# Patient Record
Sex: Male | Born: 1965 | Race: White | Hispanic: No | Marital: Single | Smoking: Never smoker
Health system: Southern US, Community
[De-identification: ages and names within clinical notes are randomized; demographics above are authoritative.]

## PROBLEM LIST (undated history)

## (undated) DIAGNOSIS — C801 Malignant (primary) neoplasm, unspecified: Secondary | ICD-10-CM

## (undated) DIAGNOSIS — C8510 Unspecified B-cell lymphoma, unspecified site: Secondary | ICD-10-CM

## (undated) DIAGNOSIS — I2510 Atherosclerotic heart disease of native coronary artery without angina pectoris: Secondary | ICD-10-CM

## (undated) DIAGNOSIS — I10 Essential (primary) hypertension: Secondary | ICD-10-CM

## (undated) DIAGNOSIS — C8590 Non-Hodgkin lymphoma, unspecified, unspecified site: Secondary | ICD-10-CM

## (undated) DIAGNOSIS — G459 Transient cerebral ischemic attack, unspecified: Secondary | ICD-10-CM

## (undated) DIAGNOSIS — I255 Ischemic cardiomyopathy: Secondary | ICD-10-CM

## (undated) DIAGNOSIS — E538 Deficiency of other specified B group vitamins: Secondary | ICD-10-CM

## (undated) DIAGNOSIS — T451X5A Adverse effect of antineoplastic and immunosuppressive drugs, initial encounter: Secondary | ICD-10-CM

## (undated) DIAGNOSIS — D126 Benign neoplasm of colon, unspecified: Secondary | ICD-10-CM

## (undated) DIAGNOSIS — R072 Precordial pain: Secondary | ICD-10-CM

## (undated) DIAGNOSIS — M5090 Cervical disc disorder, unspecified, unspecified cervical region: Secondary | ICD-10-CM

## (undated) HISTORY — PX: CORONARY ARTERY BYPASS GRAFT: SHX141

## (undated) HISTORY — PX: OTHER SURGICAL HISTORY: SHX169

## (undated) HISTORY — PX: ANKLE SURGERY: SHX546

## (undated) HISTORY — DX: Transient cerebral ischemic attack, unspecified: G45.9

---

## 2005-12-02 ENCOUNTER — Ambulatory Visit: Payer: Self-pay | Admitting: Podiatry

## 2006-08-14 ENCOUNTER — Ambulatory Visit: Payer: Self-pay | Admitting: Podiatry

## 2014-11-16 DIAGNOSIS — R7301 Impaired fasting glucose: Secondary | ICD-10-CM | POA: Insufficient documentation

## 2014-11-16 DIAGNOSIS — E785 Hyperlipidemia, unspecified: Secondary | ICD-10-CM | POA: Insufficient documentation

## 2014-11-16 DIAGNOSIS — I10 Essential (primary) hypertension: Secondary | ICD-10-CM | POA: Insufficient documentation

## 2014-11-16 DIAGNOSIS — R7302 Impaired glucose tolerance (oral): Secondary | ICD-10-CM | POA: Insufficient documentation

## 2014-11-16 DIAGNOSIS — J302 Other seasonal allergic rhinitis: Secondary | ICD-10-CM | POA: Insufficient documentation

## 2014-11-16 DIAGNOSIS — R809 Proteinuria, unspecified: Secondary | ICD-10-CM | POA: Insufficient documentation

## 2017-09-29 ENCOUNTER — Ambulatory Visit
Admission: EM | Admit: 2017-09-29 | Discharge: 2017-09-29 | Disposition: A | Discharge status: Home / Self Care | Payer: Commercial Managed Care - PPO | Attending: Family Medicine | Admitting: Family Medicine

## 2017-09-29 ENCOUNTER — Other Ambulatory Visit: Payer: Self-pay

## 2017-09-29 DIAGNOSIS — J302 Other seasonal allergic rhinitis: Secondary | ICD-10-CM | POA: Diagnosis not present

## 2017-09-29 DIAGNOSIS — R438 Other disturbances of smell and taste: Secondary | ICD-10-CM

## 2017-09-29 DIAGNOSIS — R439 Unspecified disturbances of smell and taste: Secondary | ICD-10-CM

## 2017-09-29 MED ORDER — FLUTICASONE PROPIONATE 50 MCG/ACT NA SUSP: 2.0000 | Freq: Every day | NASAL | 0 refills | Status: DC

## 2017-09-29 NOTE — Discharge Instructions (Signed)
Flonase daily.  If continues to persist, see ENT. I anticipate a slow improvement.  Take care  Dr. Lacinda Axon

## 2017-09-29 NOTE — ED Triage Notes (Signed)
Patient states that around 3 weeks ago he started with flu symptoms that then resolved and led in to allergy like symptoms. Patient states that he is here today because he has lost his sense of smell and taste.

## 2017-09-29 NOTE — ED Provider Notes (Signed)
[facility]  CSN: [id number] Arrival date & time: 09/29/17  1521  History   Chief Complaint Chief Complaint  Patient presents with  . Nasal Congestion   HPI  52 year old male presents with loss of taste and smell.  Patient states that 3 weeks ago he had suspected influenza.  This resolved within a few days and then he began to experience allergic symptoms.  He states that he typically has allergies at this time of the year.  He has a history of allergic rhinitis.  States that he has used Flonase for a few days without significant improvement.  He denies any respiratory symptoms currently.  He states that he feels well.  However, he is having difficulty with smell and taste.  He states that he is essentially lost his sense of smell and taste.  This is troublesome to him.  No recent fever.  No other associated symptoms.  No other complaints or concerns at this time.  PMH - HTN, HLD, Allergic rhinitis  Past Surgical History:  Procedure Laterality Date  . ANKLE SURGERY Right     Home Medications    Prior to Admission medications   Medication Sig Start Date End Date Taking? Authorizing Provider  aspirin 81 MG chewable tablet Chew by mouth daily.   Yes [provider]  felodipine (PLENDIL) 5 MG 24 hr tablet Take 5 mg by mouth daily.   Yes [provider]  losartan-hydrochlorothiazide (HYZAAR) 100-25 MG tablet Take 1 tablet by mouth daily.   Yes [provider]  simvastatin (ZOCOR) 40 MG tablet Take 40 mg by mouth daily.   Yes [provider]  fluticasone (FLONASE) 50 MCG/ACT nasal spray Place 2 sprays into both nostrils daily. 09/29/17   Coral Spikes, DO    Family History Family History  Problem Relation Age of Onset  . Hypertension Mother   . Hypertension Father   . Multiple myeloma Father     Social History Social History   Tobacco Use  . Smoking status: Never Smoker  . Smokeless tobacco: Never Used  Substance Use Topics  .  Alcohol use: Yes    Frequency: Never    Comment: rarely  . Drug use: No    Allergies   Patient has no known allergies.  Review of Systems Review of Systems  Constitutional: Negative.   HENT:       Loss of smell and taste.   Physical Exam Triage Vital Signs ED Triage Vitals  Enc Vitals Group     BP 09/29/17 1551 (!) 149/98     Pulse Rate 09/29/17 1551 99     Resp 09/29/17 1551 18     Temp 09/29/17 1551 98.1 F (36.7 C)     Temp Source 09/29/17 1551 Oral     SpO2 09/29/17 1551 99 %     Weight 09/29/17 1549 265 lb (120.2 kg)     Height 09/29/17 1549 6' (1.829 m)     Head Circumference --      Peak Flow --      Pain Score 09/29/17 1549 0     Pain Loc --      Pain Edu? --      Excl. in [place]? --    Updated Vital Signs BP (!) 149/98 (BP Location: Left Arm)   Pulse 99   Temp 98.1 F (36.7 C) (Oral)   Resp 18   Ht 6' (1.829 m)   Wt 265 lb (120.2 kg)   SpO2 99%  BMI 35.94 kg/m    Physical Exam  Constitutional: He is oriented to person, place, and time. He appears well-developed. No distress.  HENT:  Head: Normocephalic and atraumatic.  Nose: Nose normal.  Mouth/Throat: Oropharynx is clear and moist.  Eyes: Conjunctivae are normal. Right eye exhibits no discharge. Left eye exhibits no discharge.  Cardiovascular: Normal rate and regular rhythm.  Pulmonary/Chest: Effort normal and breath sounds normal. He has no wheezes. He has no rales.  Neurological: He is alert and oriented to person, place, and time.  Psychiatric: He has a normal mood and affect. His behavior is normal.  Nursing note and vitals reviewed.  UC Treatments / Results  Labs (all labs ordered are listed, but only abnormal results are displayed) Labs Reviewed - No data to display  EKG  EKG Interpretation None       Radiology No results found.  Procedures Procedures (including critical care time)  Medications Ordered in UC Medications - No data to display   Initial Impression /  Assessment and Plan / UC Course  I have reviewed the triage vital signs and the nursing notes.  Pertinent labs & imaging results that were available during my care of the patient were reviewed by me and considered in my medical decision making (see chart for details).     52 year old male presents with loss of smell and taste.  Likely secondary to recent infection and/or allergic rhinitis.  Treating with Flonase.  Advised to see ENT if he fails to improve or worsens.  Final Clinical Impressions(s) / UC Diagnoses   Final diagnoses:  Smell and taste disorder    ED Discharge Orders        Ordered    fluticasone (FLONASE) 50 MCG/ACT nasal spray  Daily     09/29/17 1602     Controlled Substance Prescriptions [place] Controlled Substance Registry consulted? Not Applicable   Coral Spikes, DO 09/29/17 1617

## 2017-10-24 ENCOUNTER — Other Ambulatory Visit: Payer: Self-pay | Admitting: Family Medicine

## 2017-10-28 ENCOUNTER — Other Ambulatory Visit: Payer: Self-pay | Admitting: Family Medicine

## 2017-11-02 ENCOUNTER — Other Ambulatory Visit: Payer: Self-pay | Admitting: Family Medicine

## 2017-11-19 DIAGNOSIS — D126 Benign neoplasm of colon, unspecified: Secondary | ICD-10-CM | POA: Insufficient documentation

## 2017-11-24 ENCOUNTER — Other Ambulatory Visit: Payer: Self-pay | Admitting: Physician Assistant

## 2017-11-24 DIAGNOSIS — R430 Anosmia: Secondary | ICD-10-CM

## 2017-11-27 ENCOUNTER — Ambulatory Visit
Admission: RE | Admit: 2017-11-27 | Discharge: 2017-11-27 | Disposition: A | Discharge status: Home / Self Care | Payer: Commercial Managed Care - PPO | Source: Ambulatory Visit | Attending: Physician Assistant | Admitting: Physician Assistant

## 2017-11-27 DIAGNOSIS — R430 Anosmia: Secondary | ICD-10-CM | POA: Diagnosis present

## 2017-11-27 MED ORDER — GADOBENATE DIMEGLUMINE 529 MG/ML IV SOLN
20.0000 mL | Freq: Once | INTRAVENOUS | Status: AC | PRN
  Administered 2017-11-27: 20 mL via INTRAVENOUS

## 2017-11-30 LAB — POCT I-STAT CREATININE: CREATININE: 1 mg/dL (ref 0.61–1.24)

## 2017-12-19 DIAGNOSIS — C801 Malignant (primary) neoplasm, unspecified: Secondary | ICD-10-CM

## 2017-12-19 HISTORY — DX: Malignant (primary) neoplasm, unspecified: C80.1

## 2018-01-15 ENCOUNTER — Emergency Department: Payer: Commercial Managed Care - PPO

## 2018-01-15 ENCOUNTER — Inpatient Hospital Stay
Admission: EM | Admit: 2018-01-15 | Discharge: 2018-01-16 | DRG: 178 | Disposition: A | Discharge status: Home / Self Care | Payer: Commercial Managed Care - PPO | Attending: Internal Medicine | Admitting: Internal Medicine

## 2018-01-15 ENCOUNTER — Encounter: Payer: Self-pay | Admitting: Emergency Medicine

## 2018-01-15 ENCOUNTER — Ambulatory Visit: Payer: Commercial Managed Care - PPO | Admitting: Radiation Oncology

## 2018-01-15 ENCOUNTER — Other Ambulatory Visit: Payer: Self-pay

## 2018-01-15 ENCOUNTER — Inpatient Hospital Stay (HOSPITAL_COMMUNITY)
Admit: 2018-01-15 | Discharge: 2018-01-15 | Disposition: A | Discharge status: Home / Self Care | Payer: Commercial Managed Care - PPO | Attending: Internal Medicine | Admitting: Internal Medicine

## 2018-01-15 DIAGNOSIS — J9859 Other diseases of mediastinum, not elsewhere classified: Secondary | ICD-10-CM | POA: Diagnosis present

## 2018-01-15 DIAGNOSIS — K219 Gastro-esophageal reflux disease without esophagitis: Secondary | ICD-10-CM | POA: Diagnosis present

## 2018-01-15 DIAGNOSIS — N281 Cyst of kidney, acquired: Secondary | ICD-10-CM | POA: Diagnosis present

## 2018-01-15 DIAGNOSIS — Z8249 Family history of ischemic heart disease and other diseases of the circulatory system: Secondary | ICD-10-CM

## 2018-01-15 DIAGNOSIS — Z807 Family history of other malignant neoplasms of lymphoid, hematopoietic and related tissues: Secondary | ICD-10-CM | POA: Diagnosis not present

## 2018-01-15 DIAGNOSIS — Z7982 Long term (current) use of aspirin: Secondary | ICD-10-CM | POA: Diagnosis not present

## 2018-01-15 DIAGNOSIS — R1310 Dysphagia, unspecified: Secondary | ICD-10-CM | POA: Diagnosis present

## 2018-01-15 DIAGNOSIS — I10 Essential (primary) hypertension: Secondary | ICD-10-CM

## 2018-01-15 DIAGNOSIS — R079 Chest pain, unspecified: Secondary | ICD-10-CM

## 2018-01-15 DIAGNOSIS — F458 Other somatoform disorders: Secondary | ICD-10-CM

## 2018-01-15 DIAGNOSIS — I313 Pericardial effusion (noninflammatory): Secondary | ICD-10-CM | POA: Diagnosis present

## 2018-01-15 DIAGNOSIS — R634 Abnormal weight loss: Secondary | ICD-10-CM | POA: Diagnosis not present

## 2018-01-15 DIAGNOSIS — R222 Localized swelling, mass and lump, trunk: Secondary | ICD-10-CM

## 2018-01-15 DIAGNOSIS — E279 Disorder of adrenal gland, unspecified: Secondary | ICD-10-CM | POA: Diagnosis present

## 2018-01-15 DIAGNOSIS — E785 Hyperlipidemia, unspecified: Secondary | ICD-10-CM | POA: Diagnosis present

## 2018-01-15 DIAGNOSIS — R61 Generalized hyperhidrosis: Secondary | ICD-10-CM | POA: Diagnosis not present

## 2018-01-15 DIAGNOSIS — R229 Localized swelling, mass and lump, unspecified: Secondary | ICD-10-CM

## 2018-01-15 DIAGNOSIS — IMO0002 Reserved for concepts with insufficient information to code with codable children: Secondary | ICD-10-CM

## 2018-01-15 DIAGNOSIS — R0789 Other chest pain: Secondary | ICD-10-CM | POA: Diagnosis not present

## 2018-01-15 DIAGNOSIS — C8510 Unspecified B-cell lymphoma, unspecified site: Secondary | ICD-10-CM | POA: Diagnosis present

## 2018-01-15 HISTORY — DX: Essential (primary) hypertension: I10

## 2018-01-15 LAB — BASIC METABOLIC PANEL
Anion gap: 11 (ref 5–15)
BUN: 22 mg/dL — ABNORMAL HIGH (ref 6–20)
CALCIUM: 9.1 mg/dL (ref 8.9–10.3)
CO2: 22 mmol/L (ref 22–32)
Chloride: 104 mmol/L (ref 98–111)
Creatinine, Ser: 1 mg/dL (ref 0.61–1.24)
GFR calc Af Amer: >60 mL/min (ref >60)
GFR calc non Af Amer: >60 mL/min (ref >60)
Glucose, Bld: 121 mg/dL — ABNORMAL HIGH (ref 70–99)
Potassium: 3.3 mmol/L — ABNORMAL LOW (ref 3.5–5.1)
Sodium: 137 mmol/L (ref 135–145)

## 2018-01-15 LAB — CBC
HCT: 42.2 % (ref 40.0–52.0)
HEMOGLOBIN: 14.9 g/dL (ref 13.0–18.0)
MCH: 28.4 pg (ref 26.0–34.0)
MCHC: 35.3 g/dL (ref 32.0–36.0)
MCV: 80.3 fL (ref 80.0–100.0)
Platelets: 336 10*3/uL (ref 150–440)
RBC: 5.25 MIL/uL (ref 4.40–5.90)
RDW: 16.9 % — ABNORMAL HIGH (ref 11.5–14.5)
WBC: 13 10*3/uL — ABNORMAL HIGH (ref 3.8–10.6)

## 2018-01-15 LAB — URIC ACID: Uric Acid, Serum: 6.1 mg/dL (ref 3.7–8.6)

## 2018-01-15 LAB — TROPONIN I: Troponin I: <0.03 ng/mL (ref <0.03)

## 2018-01-15 LAB — LACTATE DEHYDROGENASE: LDH: 273 U/L — AB (ref 98–192)

## 2018-01-15 LAB — ECHOCARDIOGRAM COMPLETE: WEIGHTICAEL: 4096 [oz_av]

## 2018-01-15 MED ORDER — HYDROCHLOROTHIAZIDE 25 MG PO TABS
25.0000 mg | ORAL_TABLET | Freq: Every day | ORAL | Status: DC
  Administered 2018-01-16: 25 mg via ORAL
  Filled 2018-01-15: qty 1

## 2018-01-15 MED ORDER — ACETAMINOPHEN 325 MG PO TABS: 650.0000 mg | ORAL_TABLET | Freq: Four times a day (QID) | ORAL | Status: DC | PRN

## 2018-01-15 MED ORDER — ASPIRIN 81 MG PO CHEW
81.0000 mg | CHEWABLE_TABLET | Freq: Every day | ORAL | Status: DC
  Administered 2018-01-16: 11:00:00 81 mg via ORAL
  Filled 2018-01-15: qty 1

## 2018-01-15 MED ORDER — TRAMADOL HCL 50 MG PO TABS
50.0000 mg | ORAL_TABLET | Freq: Four times a day (QID) | ORAL | Status: DC | PRN
  Administered 2018-01-15: 50 mg via ORAL
  Filled 2018-01-15: qty 1

## 2018-01-15 MED ORDER — FLUTICASONE PROPIONATE 50 MCG/ACT NA SUSP
2.0000 | Freq: Every day | NASAL | Status: DC
Start: 2018-01-15 — End: 2018-01-16
  Administered 2018-01-16: 2 via NASAL
  Filled 2018-01-15: qty 16

## 2018-01-15 MED ORDER — ALBUTEROL SULFATE (2.5 MG/3ML) 0.083% IN NEBU: 2.5000 mg | INHALATION_SOLUTION | RESPIRATORY_TRACT | Status: DC | PRN

## 2018-01-15 MED ORDER — LOSARTAN POTASSIUM-HCTZ 100-25 MG PO TABS: 1.0000 | ORAL_TABLET | Freq: Every day | ORAL | Status: DC

## 2018-01-15 MED ORDER — FELODIPINE ER 5 MG PO TB24
5.0000 mg | ORAL_TABLET | Freq: Every day | ORAL | Status: DC
  Administered 2018-01-16: 11:00:00 5 mg via ORAL
  Filled 2018-01-15 (×2): qty 1

## 2018-01-15 MED ORDER — SIMVASTATIN 20 MG PO TABS
40.0000 mg | ORAL_TABLET | Freq: Every day | ORAL | Status: DC
  Administered 2018-01-16: 11:00:00 40 mg via ORAL
  Filled 2018-01-15: qty 2

## 2018-01-15 MED ORDER — ACETAMINOPHEN 650 MG RE SUPP: 650.0000 mg | Freq: Four times a day (QID) | RECTAL | Status: DC | PRN

## 2018-01-15 MED ORDER — LOSARTAN POTASSIUM 50 MG PO TABS
100.0000 mg | ORAL_TABLET | Freq: Every day | ORAL | Status: DC
  Administered 2018-01-16: 100 mg via ORAL
  Filled 2018-01-15: qty 2

## 2018-01-15 MED ORDER — IOPAMIDOL (ISOVUE-370) INJECTION 76%
100.0000 mL | Freq: Once | INTRAVENOUS | Status: AC | PRN
  Administered 2018-01-15: 100 mL via INTRAVENOUS

## 2018-01-15 MED ORDER — ONDANSETRON HCL 4 MG/2ML IJ SOLN: 4.0000 mg | Freq: Four times a day (QID) | INTRAMUSCULAR | Status: DC | PRN

## 2018-01-15 MED ORDER — ENOXAPARIN SODIUM 40 MG/0.4ML ~~LOC~~ SOLN
40.0000 mg | SUBCUTANEOUS | Status: DC
  Administered 2018-01-15 – 2018-01-16 (×2): 40 mg via SUBCUTANEOUS
  Filled 2018-01-15 (×2): qty 0.4

## 2018-01-15 MED ORDER — ONDANSETRON HCL 4 MG PO TABS: 4.0000 mg | ORAL_TABLET | Freq: Four times a day (QID) | ORAL | Status: DC | PRN

## 2018-01-15 MED ORDER — OXYCODONE HCL 5 MG PO TABS
5.0000 mg | ORAL_TABLET | Freq: Four times a day (QID) | ORAL | Status: DC | PRN
  Administered 2018-01-15: 5 mg via ORAL
  Filled 2018-01-15: qty 1

## 2018-01-15 MED ORDER — PANTOPRAZOLE SODIUM 40 MG PO TBEC
40.0000 mg | DELAYED_RELEASE_TABLET | Freq: Every day | ORAL | Status: DC
  Administered 2018-01-15 – 2018-01-16 (×2): 40 mg via ORAL
  Filled 2018-01-15 (×2): qty 1

## 2018-01-15 NOTE — Consult Note (Signed)
[facility]  Date of admission:  01/15/2018  Inpatient day:  01/15/2018  Consulting physician:  Dr. Cheree Ditto.  Reason for Consultation:  Mediastinal mass.  Chief Complaint: Ronnie Mcintyre is a 52 y.o. male with an anterior mediastinal mass who was admitted through the emergency room.  HPI:   The patient has noted a sensation of something stuck in his throat for 1 1/2 months.  He has been seen by ENT twice.  Upper fibroscopic evaluation was negative.  Working diagnosis was reflux.  He has a follow-up on 02/02/2018.  He describes becoming more uncomfortable with pain into his upper chest (upper third of sternum), neck, and right shoulder blade.  He describes his right arm hurting and inability to lay on that side.  He has needed to sleep in a chair for the past 4-5 nights.  This week he noted sweats on his head at night.  He noted a 15 pound weight loss in the past 3 months.  He has had a cough.  He presented to the [facility] ER with chest pain (sense of fullness in upper chest) with radiation into neck and arm.  EKG was unremarkable.  CXR revealed prominence of the RIGHT mediastinal contour which may represent ectatic/aneurysmal or tortuous aorta.  Chest CT angiogram on 01/15/2018 revealed an 11.3 x 8.0 cm anterior mediastinal mass.  Echo on 01/15/2018 revealed an EF of 55060% and no wall motion abnormalities.   Past Medical History:  Diagnosis Date  . Hypertension     Past Surgical History:  Procedure Laterality Date  . ANKLE SURGERY Right     Family History  Problem Relation Age of Onset  . Hypertension Mother   . Hypertension Father   . Multiple myeloma Father     Social History:  reports that he has never smoked. He has never used smokeless tobacco. He reports that he drinks alcohol. He reports that he does not use drugs.  He drinks a beer 1-2 x/week.  He works for [employer].  He lives in [place].  His is accompanied by his  parents.  Allergies: No Known Allergies  Medications Prior to Admission  Medication Sig Dispense Refill  . aspirin 81 MG chewable tablet Chew 81 mg by mouth daily.     . felodipine (PLENDIL) 5 MG 24 hr tablet Take 5 mg by mouth daily.    [name] losartan-hydrochlorothiazide (HYZAAR) 100-25 MG tablet Take 1 tablet by mouth daily.    [name] omeprazole (PRILOSEC) 20 MG capsule Take 1 capsule by mouth daily.  3  . simvastatin (ZOCOR) 40 MG tablet Take 40 mg by mouth daily.    . fluticasone (FLONASE) 50 MCG/ACT nasal spray Place 2 sprays into both nostrils daily. 16 g 0    Review of Systems: GENERAL:  Feels "ok".  No fevers.  Head sweats at night.  Weight loss of 15 pounds in 3 months. PERFORMANCE STATUS (ECOG):  1 HEENT:  No visual changes, runny nose, sore throat, mouth sores or tenderness. Lungs: No shortness of breath.  Cough.  No hemoptysis. Cardiac:  Sense of chest fullness.  No palpitations or PND.  Sleeps in a chair x 4 days. GI:  No nausea, vomiting, diarrhea, constipation, melena or hematochezia. GU:  No urgency, frequency, dysuria, or hematuria. Musculoskeletal:  Shooting pains in neck and right shoulder.  No back pain.  No muscle tenderness. Extremities:  No pain or swelling. Skin:  Rash on stomach x 1 month.  No after bath  itching. Neuro:  Pain radiating to head.  No numbness or weakness, balance or coordination issues. Endocrine:  No diabetes, thyroid issues, hot flashes or night sweats. Psych:  No mood changes, depression or anxiety. Pain:  Discomfort/pain as noted above. Review of systems:  All other systems reviewed and found to be negative.  Physical Exam:  Blood pressure 130/82, pulse 97, temperature 98.6 F (37 C), temperature source Oral, resp. rate (!) 21, weight 256 lb (116.1 kg), SpO2 99 %.  GENERAL:  Well developed, well nourished, gentleman sitting comfortably in a chiar on the medical unit in no acute distress. MENTAL STATUS:  Alert and oriented to person, place and  time. HEAD:  Short dark hair.  Goatee.  Male pattern baldness.  Normocephalic, atraumatic, face symmetric, no Cushingoid features.  No facial plethora. EYES:  Blue eyes.  Pupils equal round and reactive to light and accomodation.  No conjunctivitis or scleral icterus. ENT:  Oropharynx clear without lesion.  Tongue normal. Mucous membranes moist.  RESPIRATORY:  Clear to auscultation without rales, wheezes or rhonchi. CARDIOVASCULAR:  Regular rate and rhythm without murmur, rub or gallop. ABDOMEN:  Soft, non-tender, with active bowel sounds, and no hepatosplenomegaly.  No masses. SKIN:  No rashes, ulcers or lesions. EXTREMITIES: No edema, no skin discoloration or tenderness.  No palpable cords. LYMPH NODES: No palpable cervical, supraclavicular, axillary or inguinal adenopathy  NEUROLOGICAL: Unremarkable. PSYCH:  Appropriate.   Results for orders placed or performed during the hospital encounter of 01/15/18 (from the past 48 hour(s))  Basic metabolic panel     Status: Abnormal   Collection Time: 01/15/18  6:32 AM  Result Value Ref Range   Sodium 137 135 - 145 mmol/L   Potassium 3.3 (L) 3.5 - 5.1 mmol/L   Chloride 104 98 - 111 mmol/L    Comment: Please note change in reference range.   CO2 22 22 - 32 mmol/L   Glucose, Bld 121 (H) 70 - 99 mg/dL    Comment: Please note change in reference range.   BUN 22 (H) 6 - 20 mg/dL    Comment: Please note change in reference range.   Creatinine, Ser 1.00 0.61 - 1.24 mg/dL   Calcium 9.1 8.9 - 10.3 mg/dL   GFR calc non Af Amer >60 >60 mL/min   GFR calc Af Amer >60 >60 mL/min    Comment: (NOTE) The eGFR has been calculated using the CKD EPI equation. This calculation has not been validated in all clinical situations. eGFR's persistently <60 mL/min signify possible Chronic Kidney Disease.    Anion gap 11 5 - 15    Comment: Performed at [facility], [address] [place], [place] [address]  CBC     Status: Abnormal   Collection Time:  01/15/18  6:32 AM  Result Value Ref Range   WBC 13.0 (H) 3.8 - 10.6 K/uL   RBC 5.25 4.40 - 5.90 MIL/uL   Hemoglobin 14.9 13.0 - 18.0 g/dL   HCT 42.2 40.0 - 52.0 %   MCV 80.3 80.0 - 100.0 fL   MCH 28.4 26.0 - 34.0 pg   MCHC 35.3 32.0 - 36.0 g/dL   RDW 16.9 (H) 11.5 - 14.5 %   Platelets 336 150 - 440 K/uL    Comment: Performed at [facility], [address] [place], [place] [address]  Troponin I     Status: None   Collection Time: 01/15/18  6:32 AM  Result Value Ref Range   Troponin I <0.03 <  0.03 ng/mL    Comment: Performed at [facility], [address] [place], [place] [address]  Lactate dehydrogenase     Status: Abnormal   Collection Time: 01/15/18  6:32 AM  Result Value Ref Range   LDH 273 (H) 98 - 192 U/L    Comment: Performed at [facility], [address] [place], [place] [address]  Uric acid     Status: None   Collection Time: 01/15/18  6:32 AM  Result Value Ref Range   Uric Acid, Serum 6.1 3.7 - 8.6 mg/dL    Comment: Please note change in reference range. Performed at [facility], [address] [place], [place] [address]   Troponin I     Status: None   Collection Time: 01/15/18  1:09 PM  Result Value Ref Range   Troponin I <0.03 <0.03 ng/mL    Comment: Performed at [facility], [address] [place], [place] [address]   Dg Chest 2 View  Result Date: 01/15/2018 CLINICAL DATA:  Acute RIGHT chest pain. EXAM: CHEST - 2 VIEW COMPARISON:  None. FINDINGS: Prominence of the RIGHT mediastinal contour may represent ectatic/aneurysmal or tortuous ascending thoracic aorta. The remainder of the cardiomediastinal silhouette is unremarkable. There is no evidence of focal airspace disease, pulmonary edema, suspicious pulmonary nodule/mass, pleural effusion, or pneumothorax. No acute bony abnormalities are identified. IMPRESSION: Prominence of the RIGHT mediastinal contour which may represent ectatic/aneurysmal or tortuous  aorta. Consider chest CT with contrast given RIGHT chest pain. No other significant abnormalities Electronically Signed   By: Margarette Canada M.D.   On: 01/15/2018 06:21   Ct Angio Chest Aorta W And/or Wo Contrast  Result Date: 01/15/2018 CLINICAL DATA:  Chest pain. EXAM: CT ANGIOGRAPHY CHEST WITH CONTRAST TECHNIQUE: Multidetector CT imaging of the chest was performed using the standard protocol during bolus administration of intravenous contrast. Multiplanar CT image reconstructions and MIPs were obtained to evaluate the vascular anatomy. CONTRAST:  142m ISOVUE-370 IOPAMIDOL (ISOVUE-370) INJECTION 76% COMPARISON:  Radiograph of same day. FINDINGS: Cardiovascular: Preferential opacification of the thoracic aorta. No evidence of thoracic aortic aneurysm or dissection. Normal heart size. No pericardial effusion. Mediastinum/Nodes: 11.3 x 8.0 cm soft tissue mass is noted in the anterior mediastinum directly abutting the anterior portion of the ascending thoracic aorta. This is highly concerning for neoplasm or malignancy, such as lymphoma, germ-cell tumor or possibly metastatic disease. Thyroid gland and esophagus are unremarkable. No other significant adenopathy is noted. Lungs/Pleura: Lungs are clear. No pleural effusion or pneumothorax. Upper Abdomen: No acute abnormality. Musculoskeletal: No chest wall abnormality. No acute or significant osseous findings. Review of the MIP images confirms the above findings. IMPRESSION: 11.3 x 8.0 cm anterior mediastinal mass is noted concerning for neoplasm or malignancy, such as lymphoma, germ-cell tumor or possibly metastatic disease. Tissue sampling is recommended. Electronically Signed   By: JMarijo Conception M.D.   On: 01/15/2018 07:37    Assessment:  The patient is a 52y.o. gentleman with an anterior mediastinal mass.  He presented with upper chest fullness with radiation to his neck and arm.  He has had B symptoms (sweats and weight loss) worrisome for lymphoma.  LDH  is 273.  Uric acid is normal.  Chest CT angiogram on 01/15/2018 revealed an 11.3 x 8.0 cm anterior mediastinal mass.  Plan:   1.  Oncology:  Discussed findings of imaging with patient.  Discuss concern for lymphoma or possibly germ cell tumor.  Additional labs requested.  Discussed  plan for abdomen and pelvic CT scan tomorrow.  Discuss plan for CT guided biopsy on Monday, 01/18/2018.  Discussed with radiology.  No evidence of SVC syndrome.   Thank you for allowing me to participate in STACIE KNUTZEN 's care.  I will follow him closely with you while hospitalized and after discharge in the outpatient department.   Lequita Asal, MD  01/15/2018, 8:38 PM

## 2018-01-15 NOTE — ED Triage Notes (Signed)
Pt c/o sudden onset of central chest pain this AM as well as a tingling to the right arm. Pt reports he has had throat issues that he has seen a PCP for and this AM pt unable to sleep due to the pain in the top center of chest . Pt denies N/V and SOB.

## 2018-01-15 NOTE — ED Provider Notes (Signed)
[facility] Emergency Department Provider Note   First MD Initiated Contact with Patient 01/15/18 0703     (approximate)  I have reviewed the triage vital signs and the nursing notes.   HISTORY  Chief Complaint Chest Pain    HPI Ronnie Mcintyre is a 52 y.o. male with history of hypertension presents to the emergency department with a 44-month history of central upper chest pain for which patient is currently being treated for possible GERD by ENT.  Patient states that the discomfort is persistent with a sensation of fullness in the upper chest.  Patient denies any dyspnea.  Patient denies any nausea or vomiting.  Patient presented tonight secondary to the fact that the pain has worsened with radiation now into the right arm and posterior neck.  Patient denies any lower extremity pain or swelling.  No history of DVT or PE.  No personal or family history of aneurysms.  She admits to a 15 pound weight loss in the past 3 months   Past Medical History:  Diagnosis Date  . Hypertension     There are no active problems to display for this patient.   Past Surgical History:  Procedure Laterality Date  . ANKLE SURGERY Right     Prior to Admission medications   Medication Sig Start Date End Date Taking? Authorizing Provider  aspirin 81 MG chewable tablet Chew by mouth daily.    [provider]  felodipine (PLENDIL) 5 MG 24 hr tablet Take 5 mg by mouth daily.    [provider]  fluticasone (FLONASE) 50 MCG/ACT nasal spray Place 2 sprays into both nostrils daily. 09/29/17   Coral Spikes, DO  losartan-hydrochlorothiazide (HYZAAR) 100-25 MG tablet Take 1 tablet by mouth daily.    [provider]  simvastatin (ZOCOR) 40 MG tablet Take 40 mg by mouth daily.    [provider]    Allergies No known drug allergies  Family History  Problem Relation Age of Onset  . Hypertension Mother   . Hypertension Father   . Multiple  myeloma Father     Social History Social History   Tobacco Use  . Smoking status: Never Smoker  . Smokeless tobacco: Never Used  Substance Use Topics  . Alcohol use: Yes    Frequency: Never    Comment: rarely  . Drug use: No    Review of Systems Constitutional: No fever/chills Eyes: No visual changes. ENT: No sore throat. Cardiovascular: Positive for chest pain. Respiratory: Denies shortness of breath. Gastrointestinal: No abdominal pain.  No nausea, no vomiting.  No diarrhea.  No constipation. Genitourinary: Negative for dysuria. Musculoskeletal: Negative for neck pain.  Negative for back pain. Integumentary: Negative for rash. Neurological: Negative for headaches, focal weakness or numbness.   ____________________________________________   PHYSICAL EXAM:  VITAL SIGNS: ED Triage Vitals  Enc Vitals Group     BP 01/15/18 0606 137/90     Pulse Rate 01/15/18 0606 (!) 105     Resp 01/15/18 0606 17     Temp 01/15/18 0606 97.6 F (36.4 C)     Temp Source 01/15/18 0606 Oral     SpO2 01/15/18 0606 99 %     Weight 01/15/18 0601 116.1 kg (256 lb)     Height --      Head Circumference --      Peak Flow --      Pain Score 01/15/18 0635 5     Pain Loc --  Pain Edu? --      Excl. in [place]? --     Constitutional: Alert and oriented. Well appearing and in no acute distress. Eyes: Conjunctivae are normal.  Head: Atraumatic. Mouth/Throat: Mucous membranes are moist  Oropharynx non-erythematous. Neck: No stridor.   Cardiovascular: Normal rate, regular rhythm. Good peripheral circulation. Grossly normal heart sounds. Respiratory: Normal respiratory effort.  No retractions. Lungs CTAB. Gastrointestinal: Soft and nontender. No distention.  Musculoskeletal: No lower extremity tenderness nor edema. No gross deformities of extremities. Neurologic:  Normal speech and language. No gross focal neurologic deficits are appreciated.  Skin:  Skin is warm, dry and intact. No rash  noted. Psychiatric: Mood and affect are normal. Speech and behavior are normal.  ____________________________________________   LABS (all labs ordered are listed, but only abnormal results are displayed)  Labs Reviewed  BASIC METABOLIC PANEL - Abnormal; Notable for the following components:      Result Value   Potassium 3.3 (*)    Glucose, Bld 121 (*)    BUN 22 (*)    All other components within normal limits  CBC - Abnormal; Notable for the following components:   WBC 13.0 (*)    RDW 16.9 (*)    All other components within normal limits  TROPONIN I  LACTATE DEHYDROGENASE  URIC ACID   ____________________________________________  EKG  ED ECG REPORT I, [place] N Ranvir Renovato, the attending physician, personally viewed and interpreted this ECG.   Date: 01/15/2018  EKG Time: 6:06 AM  Rate: 105  Rhythm: Sinus tachycardia  Axis: Normal  Intervals: Normal  ST&T Change: None  ____________________________________________  RADIOLOGY I, [place] N Robertta Halfhill, personally viewed and evaluated these images (plain radiographs) as part of my medical decision making, as well as reviewing the written report by the radiologist.  ED MD interpretation: Chest x-ray revealed prominence of the mediastinal contour with enlarged mediastinum.  Official radiology report(s): Dg Chest 2 View  Result Date: 01/15/2018 CLINICAL DATA:  Acute RIGHT chest pain. EXAM: CHEST - 2 VIEW COMPARISON:  None. FINDINGS: Prominence of the RIGHT mediastinal contour may represent ectatic/aneurysmal or tortuous ascending thoracic aorta. The remainder of the cardiomediastinal silhouette is unremarkable. There is no evidence of focal airspace disease, pulmonary edema, suspicious pulmonary nodule/mass, pleural effusion, or pneumothorax. No acute bony abnormalities are identified. IMPRESSION: Prominence of the RIGHT mediastinal contour which may represent ectatic/aneurysmal or tortuous aorta. Consider chest CT with contrast given  RIGHT chest pain. No other significant abnormalities Electronically Signed   By: Margarette Canada M.D.   On: 01/15/2018 06:21   Ct Angio Chest Aorta W And/or Wo Contrast  Result Date: 01/15/2018 CLINICAL DATA:  Chest pain. EXAM: CT ANGIOGRAPHY CHEST WITH CONTRAST TECHNIQUE: Multidetector CT imaging of the chest was performed using the standard protocol during bolus administration of intravenous contrast. Multiplanar CT image reconstructions and MIPs were obtained to evaluate the vascular anatomy. CONTRAST:  122mL ISOVUE-370 IOPAMIDOL (ISOVUE-370) INJECTION 76% COMPARISON:  Radiograph of same day. FINDINGS: Cardiovascular: Preferential opacification of the thoracic aorta. No evidence of thoracic aortic aneurysm or dissection. Normal heart size. No pericardial effusion. Mediastinum/Nodes: 11.3 x 8.0 cm soft tissue mass is noted in the anterior mediastinum directly abutting the anterior portion of the ascending thoracic aorta. This is highly concerning for neoplasm or malignancy, such as lymphoma, germ-cell tumor or possibly metastatic disease. Thyroid gland and esophagus are unremarkable. No other significant adenopathy is noted. Lungs/Pleura: Lungs are clear. No pleural effusion or pneumothorax. Upper Abdomen: No acute abnormality. Musculoskeletal: No chest  wall abnormality. No acute or significant osseous findings. Review of the MIP images confirms the above findings. IMPRESSION: 11.3 x 8.0 cm anterior mediastinal mass is noted concerning for neoplasm or malignancy, such as lymphoma, germ-cell tumor or possibly metastatic disease. Tissue sampling is recommended. Electronically Signed   By: Marijo Conception, M.D.   On: 01/15/2018 07:37    ____________________________________________  Procedures   ____________________________________________   INITIAL IMPRESSION / ASSESSMENT AND PLAN / ED COURSE  As part of my medical decision making, I reviewed the following data within the electronic MEDICAL RECORD NUMBER    52 year old presenting with above-stated history and physical exam secondary to chest pain.  Patient's chest x-ray revealed a widened mediastinum and as a result CT scan of the chest was performed which revealed a 11 x 8 cm anterior mediastinal mass.  Patient has no evidence of SVC syndrome at this time.  Patient discussed with Dr. Mike Gip oncologist on call as well as Dr. Posey Pronto hospitalist on-call.  Patient will be admitted for further evaluation and management.    ____________________________________________  FINAL CLINICAL IMPRESSION(S) / ED DIAGNOSES  Final diagnoses:  Mediastinal mass     MEDICATIONS GIVEN DURING THIS VISIT:  Medications  iopamidol (ISOVUE-370) 76 % injection 100 mL (100 mLs Intravenous Contrast Given 01/15/18 0704)     ED Discharge Orders    None       Note:  This document was prepared using Dragon voice recognition software and may include unintentional dictation errors.    Gregor Hams, MD 01/16/18 [phone]

## 2018-01-15 NOTE — H&P (Signed)
[facility] [place] NAME: Ronnie Mcintyre    MR#:  [id number]  DATE OF BIRTH:  07/09/1966  DATE OF ADMISSION:  01/15/2018  PRIMARY CARE PHYSICIAN: Katheren Shams   REQUESTING/REFERRING PHYSICIAN: Dr brown  CHIEF COMPLAINT:  increasing shortness of breath, chest discomfort and radiation to the right arm for 2-3 weeks and dysphagia  on and off for 5 to 6 weeks.  HISTORY OF PRESENT ILLNESS:  Demarrius Guerrero  is a 52 y.o. male with a known history of HTN, hyperlipidemia comes to the emergency room with dysphagia on and off for 5 to 6 weeks so ENT started treatment for Gerd. Patient said his symptoms do not get any better. He started having some shortness of breath could not lay conference comfortably at nighttime and started having some chest discomfort with radiation to the right arm. Came to the emergency room chest x-ray showed some mediastinal fullness. CT of the chest was done shows 11 x 8 cm and yet anterior mediastinal mass. Troponin is .03. ER physician spoke with Dr. Mike Gip and recommends for the workup in the hospital. Patient says lately has been having night sweats where his back of his head feels wet when he wakes up. Denies any significant weight loss.  PAST MEDICAL HISTORY:   Past Medical History:  Diagnosis Date  . Hypertension     PAST SURGICAL HISTOIRY:   Past Surgical History:  Procedure Laterality Date  . ANKLE SURGERY Right     SOCIAL HISTORY:   Social History   Tobacco Use  . Smoking status: Never Smoker  . Smokeless tobacco: Never Used  Substance Use Topics  . Alcohol use: Yes    Frequency: Never    Comment: rarely    FAMILY HISTORY:   Family History  Problem Relation Age of Onset  . Hypertension Mother   . Hypertension Father   . Multiple myeloma Father     DRUG ALLERGIES:  No Known Allergies  REVIEW OF SYSTEMS:  Review of Systems  Constitutional: Negative for chills, fever  and weight loss.  HENT: Negative for ear discharge, ear pain and nosebleeds.   Eyes: Negative for blurred vision, pain and discharge.  Respiratory: Negative for sputum production, shortness of breath, wheezing and stridor.   Cardiovascular: Negative for chest pain, palpitations, orthopnea and PND.  Gastrointestinal: Negative for abdominal pain, diarrhea, nausea and vomiting.  Genitourinary: Negative for frequency and urgency.  Musculoskeletal: Negative for back pain and joint pain.  Neurological: Positive for weakness. Negative for sensory change, speech change and focal weakness.  Psychiatric/Behavioral: Negative for depression and hallucinations. The patient is not nervous/anxious.      MEDICATIONS AT HOME:   Prior to Admission medications   Medication Sig Start Date End Date Taking? Authorizing Provider  aspirin 81 MG chewable tablet Chew 81 mg by mouth daily.    Yes [provider]  felodipine (PLENDIL) 5 MG 24 hr tablet Take 5 mg by mouth daily.   Yes [provider]  losartan-hydrochlorothiazide (HYZAAR) 100-25 MG tablet Take 1 tablet by mouth daily.   Yes [provider]  omeprazole (PRILOSEC) 20 MG capsule Take 1 capsule by mouth daily. 11/24/17  Yes [provider]  simvastatin (ZOCOR) 40 MG tablet Take 40 mg by mouth daily.   Yes [provider]  fluticasone (FLONASE) 50 MCG/ACT nasal spray Place 2 sprays into both nostrils daily. 09/29/17   Coral Spikes, DO      VITAL  SIGNS:  Blood pressure (!) 140/97, pulse (!) 101, temperature 98.3 F (36.8 C), temperature source Oral, resp. rate 20, weight 116.1 kg (256 lb), SpO2 100 %.  PHYSICAL EXAMINATION:  GENERAL:  52 y.o.-year-old patient lying in the bed with no acute distress.  EYES: Pupils equal, round, reactive to light and accommodation. No scleral icterus. Extraocular muscles intact.  HEENT: Head atraumatic, normocephalic. Oropharynx and nasopharynx clear.  NECK:  Supple, no jugular  venous distention. No thyroid enlargement, no tenderness.  LUNGS: Normal breath sounds bilaterally, no wheezing, rales,rhonchi or crepitation. No use of accessory muscles of respiration.  CARDIOVASCULAR: S1, S2 normal. No murmurs, rubs, or gallops.  ABDOMEN: Soft, nontender, nondistended. Bowel sounds present. No organomegaly or mass.  EXTREMITIES: No pedal edema, cyanosis, or clubbing.  NEUROLOGIC: Cranial nerves II through XII are intact. Muscle strength 5/5 in all extremities. Sensation intact. Gait not checked.  PSYCHIATRIC: The patient is alert and oriented x 3.  SKIN: No obvious rash, lesion, or ulcer.   LABORATORY PANEL:   CBC Recent Labs  Lab 01/15/18 0632  WBC 13.0*  HGB 14.9  HCT 42.2  PLT 336   ------------------------------------------------------------------------------------------------------------------  Chemistries  Recent Labs  Lab 01/15/18 0632  NA 137  K 3.3*  CL 104  CO2 22  GLUCOSE 121*  BUN 22*  CREATININE 1.00  CALCIUM 9.1   ------------------------------------------------------------------------------------------------------------------  Cardiac Enzymes Recent Labs  Lab 01/15/18 6269  TROPONINI <0.03   ------------------------------------------------------------------------------------------------------------------  RADIOLOGY:  Dg Chest 2 View  Result Date: 01/15/2018 CLINICAL DATA:  Acute RIGHT chest pain. EXAM: CHEST - 2 VIEW COMPARISON:  None. FINDINGS: Prominence of the RIGHT mediastinal contour may represent ectatic/aneurysmal or tortuous ascending thoracic aorta. The remainder of the cardiomediastinal silhouette is unremarkable. There is no evidence of focal airspace disease, pulmonary edema, suspicious pulmonary nodule/mass, pleural effusion, or pneumothorax. No acute bony abnormalities are identified. IMPRESSION: Prominence of the RIGHT mediastinal contour which may represent ectatic/aneurysmal or tortuous aorta. Consider chest CT with  contrast given RIGHT chest pain. No other significant abnormalities Electronically Signed   By: Margarette Canada M.D.   On: 01/15/2018 06:21   Ct Angio Chest Aorta W And/or Wo Contrast  Result Date: 01/15/2018 CLINICAL DATA:  Chest pain. EXAM: CT ANGIOGRAPHY CHEST WITH CONTRAST TECHNIQUE: Multidetector CT imaging of the chest was performed using the standard protocol during bolus administration of intravenous contrast. Multiplanar CT image reconstructions and MIPs were obtained to evaluate the vascular anatomy. CONTRAST:  132m ISOVUE-370 IOPAMIDOL (ISOVUE-370) INJECTION 76% COMPARISON:  Radiograph of same day. FINDINGS: Cardiovascular: Preferential opacification of the thoracic aorta. No evidence of thoracic aortic aneurysm or dissection. Normal heart size. No pericardial effusion. Mediastinum/Nodes: 11.3 x 8.0 cm soft tissue mass is noted in the anterior mediastinum directly abutting the anterior portion of the ascending thoracic aorta. This is highly concerning for neoplasm or malignancy, such as lymphoma, germ-cell tumor or possibly metastatic disease. Thyroid gland and esophagus are unremarkable. No other significant adenopathy is noted. Lungs/Pleura: Lungs are clear. No pleural effusion or pneumothorax. Upper Abdomen: No acute abnormality. Musculoskeletal: No chest wall abnormality. No acute or significant osseous findings. Review of the MIP images confirms the above findings. IMPRESSION: 11.3 x 8.0 cm anterior mediastinal mass is noted concerning for neoplasm or malignancy, such as lymphoma, germ-cell tumor or possibly metastatic disease. Tissue sampling is recommended. Electronically Signed   By: JMarijo Conception M.D.   On: 01/15/2018 07:37    EKG:    IMPRESSION AND PLAN:   RClinton Gallant  is a 52 y.o. male with a known history of HTN, hyperlipidemia comes to the emergency room with dysphagia on and off for 5 to 6 weeks so ENT started treatment for Gerd. Patient said his symptoms do not get any  better. He started having some shortness of breath could not lay conference comfortably at nighttime and started having some chest discomfort with radiation to the right arm. Came to the emergency room chest x-ray showed some mediastinal fullness. CT of the chest was done shows 11 x 8 cm and yet anterior mediastinal mass.  1. chest pain with shortness of breath night sweats dysphagia on and off for more than six weeks with abnormal CT chest suggestive anterior mediastinal mass -differential lymphoma versus germ cell tumor versus some other malignancy -ecology consultation for the workup according to Dr. Kem Parkinson evaluation  2. chest pain -patient has risk factor with hypertension and hyperlipidemia. Will rule out ACS -i troponin times three -continue blood pressure meds. PRN Nitro  3. Hyperlipidemia on statins  4. DVT prophylaxis Lovenox  This with patient and his parents in the room  All the records are reviewed and case discussed with ED provider. Management plans discussed with the patient, family and they are in agreement.  CODE STATUS: full  TOTAL TIME TAKING CARE OF THIS PATIENT: 50 minutes.    Fritzi Mandes M.D on 01/15/2018 at 11:24 AM  Between 7am to 6pm - Pager - [phone]  After 6pm go to [email or web address] - password [email or web address] [facility]  [facility]  Office  [phone]  CC: Primary care physician; Katheren Shams

## 2018-01-15 NOTE — Progress Notes (Signed)
*  PRELIMINARY RESULTS* Echocardiogram 2D Echocardiogram has been performed.  Ronnie Mcintyre 01/15/2018, 11:49 AM

## 2018-01-15 NOTE — Consult Note (Signed)
NEW PATIENT EVALUATION  Name: Ronnie Mcintyre  MRN: [id number]  Date:   01/15/2018     DOB: 12-09-1965   This 52 y.o. male patient admitted to the hospital for mediastinal mass  REFERRING PHYSICIAN: No ref. provider found  CHIEF COMPLAINT:  Chief Complaint  Patient presents with  . Chest Pain    DIAGNOSIS: The encounter diagnosis was Mediastinal mass.   PREVIOUS INVESTIGATIONS:  CT scan of the chest reviewed Clinical notes reviewed  HPI: patient is a 52 year old male admitted through the emergency department for increasing upper chest pain or discomfort and dysphagia. He had been treated for gastric reflux. He went on to have some night sweats presented to the emergency department underwent CT scan of the chest showing an 11.3 x 8.0 anteromediastinal mass concerning for neoplasm or malignancy such as lymphoma or germ cell tumor. No evidence of compression of the. A copy was seen. He has no evidence of venous jugular distention or collateral circulation. He has no facial plethora. He specifically denies hemoptysis cough or bone pain.  PLANNED TREATMENT REGIMEN: biopsy with further recommend patient's  PAST MEDICAL HISTORY:  has a past medical history of Hypertension.    PAST SURGICAL HISTORY:  Past Surgical History:  Procedure Laterality Date  . ANKLE SURGERY Right     FAMILY HISTORY: family history includes Hypertension in his father and mother; Multiple myeloma in his father.  SOCIAL HISTORY:  reports that he has never smoked. He has never used smokeless tobacco. He reports that he drinks alcohol. He reports that he does not use drugs.  ALLERGIES: Patient has no known allergies.  MEDICATIONS:  Current Facility-Administered Medications  Medication Dose Route Frequency Provider Last Rate Last Dose  . acetaminophen (TYLENOL) tablet 650 mg  650 mg Oral Q6H PRN Fritzi Mandes, MD       Or  . acetaminophen (TYLENOL) suppository 650 mg  650 mg Rectal Q6H PRN Fritzi Mandes, MD       . albuterol (PROVENTIL) (2.5 MG/3ML) 0.083% nebulizer solution 2.5 mg  2.5 mg Nebulization Q2H PRN Fritzi Mandes, MD      . aspirin chewable tablet 81 mg  81 mg Oral Daily Fritzi Mandes, MD      . enoxaparin (LOVENOX) injection 40 mg  40 mg Subcutaneous Q24H Fritzi Mandes, MD   40 mg at 01/15/18 1037  . felodipine (PLENDIL) 24 hr tablet 5 mg  5 mg Oral Daily Fritzi Mandes, MD      . fluticasone (FLONASE) 50 MCG/ACT nasal spray 2 spray  2 spray Each Nare Daily Fritzi Mandes, MD      . losartan (COZAAR) tablet 100 mg  100 mg Oral Daily Fritzi Mandes, MD       And  . hydrochlorothiazide (HYDRODIURIL) tablet 25 mg  25 mg Oral Daily Fritzi Mandes, MD      . ondansetron [facility]) tablet 4 mg  4 mg Oral Q6H PRN Fritzi Mandes, MD       Or  . ondansetron [facility]) injection 4 mg  4 mg Intravenous Q6H PRN Fritzi Mandes, MD      . pantoprazole (PROTONIX) EC tablet 40 mg  40 mg Oral Daily Fritzi Mandes, MD   40 mg at 01/15/18 1037  . simvastatin (ZOCOR) tablet 40 mg  40 mg Oral Daily Fritzi Mandes, MD      . traMADol [facility]) tablet 50 mg  50 mg Oral Q6H PRN Fritzi Mandes, MD        ECOG PERFORMANCE STATUS:  1 - Symptomatic but completely ambulatory  REVIEW OF SYSTEMS:  Patient denies any weight loss, fatigue, weakness, fever, chills or night sweats. Patient denies any loss of vision, blurred vision. Patient denies any ringing  of the ears or hearing loss. No irregular heartbeat. Patient denies heart murmur or history of fainting. Patient denies any chest pain or pain radiating to her upper extremities. Patient denies any shortness of breath, difficulty breathing at night, cough or hemoptysis. Patient denies any swelling in the lower legs. Patient denies any nausea vomiting, vomiting of blood, or coffee ground material in the vomitus. Patient denies any stomach pain. Patient states has had normal bowel movements no significant constipation or diarrhea. Patient denies any dysuria, hematuria or significant nocturia. Patient denies any  problems walking, swelling in the joints or loss of balance. Patient denies any skin changes, loss of hair or loss of weight. Patient denies any excessive worrying or anxiety or significant depression. Patient denies any problems with insomnia. Patient denies excessive thirst, polyuria, polydipsia. Patient denies any swollen glands, patient denies easy bruising or easy bleeding. Patient denies any recent infections, allergies or URI. Patient "s visual fields have not changed significantly in recent time.    PHYSICAL EXAM: BP (!) 140/97 (BP Location: Right Arm)   Pulse (!) 101   Temp 98.3 F (36.8 C) (Oral)   Resp 20   Wt 256 lb (116.1 kg)   SpO2 100%   BMI 34.72 kg/m  Well-developed slightly obese male in NAD. No evidence of supraclavicular clavicular axillary or internal adenopathy is noted. No evidence of facial plethora collateral circulation or venous jugular distention is noted. Well-developed well-nourished patient in NAD. HEENT reveals PERLA, EOMI, discs not visualized.  Oral cavity is clear. No oral mucosal lesions are identified. Neck is clear without evidence of cervical or supraclavicular adenopathy. Lungs are clear to A&P. Cardiac examination is essentially unremarkable with regular rate and rhythm without murmur rub or thrill. Abdomen is benign with no organomegaly or masses noted. Motor sensory and DTR levels are equal and symmetric in the upper and lower extremities. Cranial nerves II through XII are grossly intact. Proprioception is intact. No peripheral adenopathy or edema is identified. No motor or sensory levels are noted. Crude visual fields are within normal range.  LABORATORY DATA: biopsy pending    RADIOLOGY RESULTS:CT scan reviewed and compatible with the above-stated findings   IMPRESSION: 52 year old male with large anterior mediastinal mass for biopsy and further decision making.  PLAN: at this time I see no evidence of superior vena cava syndrome. I have discussed  my role in his treatments. Should this be lymphoma may opt at some point to treat with involved field radiation. Otherwise other treatment recommendations will be decided and discussed after biopsy is obtained. Patient will be seen by medical oncology today. Patient family know to call me with any concerns at any time.  I would like to take this opportunity to thank you for allowing me to participate in the care of your patient.Noreene Filbert, MD

## 2018-01-15 NOTE — Progress Notes (Signed)
Patient admitted for chest pain, alert and oriented, denies any pain at this time, vss mood calm . Family at bedside, patient awaiting patient awaiting to get CT of ABD at this time .

## 2018-01-16 ENCOUNTER — Inpatient Hospital Stay: Payer: Commercial Managed Care - PPO

## 2018-01-16 ENCOUNTER — Other Ambulatory Visit: Payer: Self-pay | Admitting: Student

## 2018-01-16 DIAGNOSIS — R0789 Other chest pain: Secondary | ICD-10-CM

## 2018-01-16 DIAGNOSIS — R61 Generalized hyperhidrosis: Secondary | ICD-10-CM

## 2018-01-16 DIAGNOSIS — I313 Pericardial effusion (noninflammatory): Secondary | ICD-10-CM

## 2018-01-16 DIAGNOSIS — R634 Abnormal weight loss: Secondary | ICD-10-CM

## 2018-01-16 DIAGNOSIS — N281 Cyst of kidney, acquired: Secondary | ICD-10-CM

## 2018-01-16 LAB — BASIC METABOLIC PANEL
Anion gap: 11 (ref 5–15)
BUN: 20 mg/dL (ref 6–20)
CO2: 25 mmol/L (ref 22–32)
Calcium: 8.9 mg/dL (ref 8.9–10.3)
Chloride: 99 mmol/L (ref 98–111)
Creatinine, Ser: 0.95 mg/dL (ref 0.61–1.24)
GFR calc Af Amer: >60 mL/min (ref >60)
GFR calc non Af Amer: >60 mL/min (ref >60)
Glucose, Bld: 133 mg/dL — ABNORMAL HIGH (ref 70–99)
Potassium: 3.1 mmol/L — ABNORMAL LOW (ref 3.5–5.1)
Sodium: 135 mmol/L (ref 135–145)

## 2018-01-16 LAB — TROPONIN I: Troponin I: <0.03 ng/mL (ref <0.03)

## 2018-01-16 LAB — PROTIME-INR
INR: 1.12
Prothrombin Time: 14.3 seconds (ref 11.4–15.2)

## 2018-01-16 MED ORDER — IOPAMIDOL (ISOVUE-300) INJECTION 61%
15.0000 mL | INTRAVENOUS | Status: AC
  Administered 2018-01-16 (×2): 15 mL via ORAL

## 2018-01-16 MED ORDER — ACETAMINOPHEN 325 MG PO TABS: 650.0000 mg | ORAL_TABLET | Freq: Four times a day (QID) | ORAL | Status: AC | PRN

## 2018-01-16 MED ORDER — OXYCODONE HCL 5 MG PO TABS: 5.0000 mg | ORAL_TABLET | Freq: Four times a day (QID) | ORAL | 0 refills | Status: DC | PRN

## 2018-01-16 MED ORDER — IOHEXOL 350 MG/ML SOLN: 75.0000 mL | Freq: Once | INTRAVENOUS | Status: DC | PRN

## 2018-01-16 MED ORDER — POTASSIUM CHLORIDE CRYS ER 20 MEQ PO TBCR: 40.0000 meq | EXTENDED_RELEASE_TABLET | Freq: Every day | ORAL | 0 refills | Status: DC

## 2018-01-16 MED ORDER — POTASSIUM CHLORIDE CRYS ER 20 MEQ PO TBCR
40.0000 meq | EXTENDED_RELEASE_TABLET | ORAL | Status: DC
  Administered 2018-01-16: 40 meq via ORAL
  Filled 2018-01-16: qty 2

## 2018-01-16 MED ORDER — IOPAMIDOL (ISOVUE-370) INJECTION 76%
100.0000 mL | Freq: Once | INTRAVENOUS | Status: AC | PRN
  Administered 2018-01-16: 100 mL via INTRAVENOUS

## 2018-01-16 NOTE — Discharge Summary (Signed)
[facility] - [place] at [facility], 52 y.o., DOB Nov 22, 1965, MRN [id number]. Admission date: 01/15/2018 Discharge Date 01/16/2018 Primary MD Katheren Shams Admitting Physician Fritzi Mandes, MD  Admission Diagnosis  Mediastinal mass [J98.59]  Discharge Diagnosis   Active Problems:   Mediastinal mass Chest pain due to mediastinal mass Hyperlipidemia Hypertension Hypokalemia     Hospital Course   Taggert Bozzi  is a 52 y.o. male with a known history of HTN, hyperlipidemia comes to the emergency room with dysphagia on and off for 5 to 6 weeks so ENT started treatment for Gerd. Patient said his symptoms do not get any better. He started having some shortness of breath could not lay conference comfortably at nighttime and started having some chest discomfort with radiation to the right arm. Came to the emergency room chest x-ray showed some mediastinal fullness. CT of the chest was done shows 11 x 8 cm and yet anterior mediastinal mass.  There was some concern about superior vena cava syndrome.  He was seen by radiation oncology who felt that did not have superior vena cava syndrome.  Patient was also seen by oncology.  Patient underwent a CT of the abdomen.  I discussed with interventional radiology Dr. Vernard Gambles who states that he left the message for interventional radiology team to call the patient on Monday to squeeze him in for the procedure.          Consults  None  Significant Tests:  See full reports for all details    Dg Chest 2 View  Result Date: 01/15/2018 CLINICAL DATA:  Acute RIGHT chest pain. EXAM: CHEST - 2 VIEW COMPARISON:  None. FINDINGS: Prominence of the RIGHT mediastinal contour may represent ectatic/aneurysmal or tortuous ascending thoracic aorta. The remainder of the cardiomediastinal silhouette is unremarkable. There is no evidence of focal airspace disease, pulmonary edema, suspicious pulmonary nodule/mass, pleural  effusion, or pneumothorax. No acute bony abnormalities are identified. IMPRESSION: Prominence of the RIGHT mediastinal contour which may represent ectatic/aneurysmal or tortuous aorta. Consider chest CT with contrast given RIGHT chest pain. No other significant abnormalities Electronically Signed   By: Margarette Canada M.D.   On: 01/15/2018 06:21   Ct Abdomen Pelvis W Contrast  Result Date: 01/16/2018 CLINICAL DATA:  Anterior mediastinal mass on a chest CTA obtained yesterday. The patient has had upper chest pain and since fullness radiating into the neck and right arm. He also has had a cough and has had a 15 lb weight loss in the past 3 months. He has had a foreign body sensation in his throat for the past 1.5 months. EXAM: CT ABDOMEN AND PELVIS WITH CONTRAST TECHNIQUE: Multidetector CT imaging of the abdomen and pelvis was performed using the standard protocol following bolus administration of intravenous contrast. CONTRAST:  100 cc Isovue 370 COMPARISON:  Chest CTA obtained yesterday. FINDINGS: Lower chest: The previously demonstrated large anterior mediastinal mass on the right is again demonstrated. There is also a small pericardial effusion with a maximum thickness of 5 mm. Also demonstrated are coronary artery calcifications. Mild linear atelectasis at the left lung base. Hepatobiliary: Probable sludge in the gallbladder as well as a gallbladder Phrygian cap. No gallbladder wall thickening or pericholecystic fluid. Unremarkable liver. Pancreas: Unremarkable. No pancreatic ductal dilatation or surrounding inflammatory changes. Spleen: Normal in size without focal abnormality. Adrenals/Urinary Tract: Small left adrenal mass measuring 1.9 x 1.6 cm on image number 36 series 2. This is mildly heterogeneous. Normal appearing right adrenal gland. Small  bilateral renal cysts. These include a 1.5 cm lower pole cyst on the right, laterally, containing a small amount of mildly thickened internal septation without gross  evidence of enhancement. Minimal low-density bladder wall thickening there is also medium density urine or a soft tissue mass filling the bladder, measuring 39 Hounsfield units on image number 93 of series 2. This measures 6.3 x 5.6 cm on the same image and 4.2 cm in length on sagittal image number 70. Stomach/Bowel: Unremarkable stomach, small bowel and colon. No evidence of appendicitis. Vascular/Lymphatic: Minimal atheromatous iliac artery calcifications. No aneurysm or enlarged lymph nodes. Reproductive: Mildly enlarged prostate gland. Other: Small umbilical hernia containing fat. Musculoskeletal: Lumbar and lower thoracic spine degenerative changes. IMPRESSION: 1. Soft tissue mass or medium density fluid filling the urinary bladder. These possibilities could be resolved with urinary tract ultrasound. 2. 1.5 cm mildly complex lower pole right renal cyst. Further evaluation with pre and post contrast MRI should be considered. Pre and post contrast CT could alternatively be performed, but would likely be of decreased accuracy given lesion size. 3. 1.9 cm indeterminate left adrenal mass. This could represent an adenoma, metastasis or small primary adrenal neoplasm. This could also be assessed at time of MRI of the kidneys. 4. Previously demonstrated large anterior mediastinal mass. 5.  Calcific coronary artery atherosclerosis. 6. Small pericardial effusion. Electronically Signed   By: Claudie Revering M.D.   On: 01/16/2018 12:43   Ct Angio Chest Aorta W And/or Wo Contrast  Result Date: 01/15/2018 CLINICAL DATA:  Chest pain. EXAM: CT ANGIOGRAPHY CHEST WITH CONTRAST TECHNIQUE: Multidetector CT imaging of the chest was performed using the standard protocol during bolus administration of intravenous contrast. Multiplanar CT image reconstructions and MIPs were obtained to evaluate the vascular anatomy. CONTRAST:  12mL ISOVUE-370 IOPAMIDOL (ISOVUE-370) INJECTION 76% COMPARISON:  Radiograph of same day. FINDINGS:  Cardiovascular: Preferential opacification of the thoracic aorta. No evidence of thoracic aortic aneurysm or dissection. Normal heart size. No pericardial effusion. Mediastinum/Nodes: 11.3 x 8.0 cm soft tissue mass is noted in the anterior mediastinum directly abutting the anterior portion of the ascending thoracic aorta. This is highly concerning for neoplasm or malignancy, such as lymphoma, germ-cell tumor or possibly metastatic disease. Thyroid gland and esophagus are unremarkable. No other significant adenopathy is noted. Lungs/Pleura: Lungs are clear. No pleural effusion or pneumothorax. Upper Abdomen: No acute abnormality. Musculoskeletal: No chest wall abnormality. No acute or significant osseous findings. Review of the MIP images confirms the above findings. IMPRESSION: 11.3 x 8.0 cm anterior mediastinal mass is noted concerning for neoplasm or malignancy, such as lymphoma, germ-cell tumor or possibly metastatic disease. Tissue sampling is recommended. Electronically Signed   By: Marijo Conception, M.D.   On: 01/15/2018 07:37       Today   Subjective:   Ronnie Mcintyre patient feeling well denies any complaints  Objective:   Blood pressure 116/84, pulse 93, temperature 98.5 F (36.9 C), temperature source Oral, resp. rate 20, weight 116.1 kg (256 lb), SpO2 98 %.  . No intake or output data in the 24 hours ending 01/16/18 1430  Exam VITAL SIGNS: Blood pressure 116/84, pulse 93, temperature 98.5 F (36.9 C), temperature source Oral, resp. rate 20, weight 116.1 kg (256 lb), SpO2 98 %.  GENERAL:  52 y.o.-year-old patient lying in the bed with no acute distress.  EYES: Pupils equal, round, reactive to light and accommodation. No scleral icterus. Extraocular muscles intact.  HEENT: Head atraumatic, normocephalic. Oropharynx and nasopharynx clear.  NECK:  Supple, no jugular venous distention. No thyroid enlargement, no tenderness.  LUNGS: Normal breath sounds bilaterally, no wheezing,  rales,rhonchi or crepitation. No use of accessory muscles of respiration.  CARDIOVASCULAR: S1, S2 normal. No murmurs, rubs, or gallops.  ABDOMEN: Soft, nontender, nondistended. Bowel sounds present. No organomegaly or mass.  EXTREMITIES: No pedal edema, cyanosis, or clubbing.  NEUROLOGIC: Cranial nerves II through XII are intact. Muscle strength 5/5 in all extremities. Sensation intact. Gait not checked.  PSYCHIATRIC: The patient is alert and oriented x 3.  SKIN: No obvious rash, lesion, or ulcer.   Data Review     CBC w Diff:  Lab Results  Component Value Date   WBC 13.0 (H) 01/15/2018   HGB 14.9 01/15/2018   HCT 42.2 01/15/2018   PLT 336 01/15/2018   CMP:  Lab Results  Component Value Date   NA 135 01/16/2018   K 3.1 (L) 01/16/2018   CL 99 01/16/2018   CO2 25 01/16/2018   BUN 20 01/16/2018   CREATININE 0.95 01/16/2018  .  Micro Results No results found for this or any previous visit (from the past 240 hour(s)).      Code Status Orders  (From admission, onward)        Start     Ordered   01/15/18 0916  Full code  Continuous     01/15/18 0916    Code Status History    This patient has a current code status but no historical code status.          Follow-up Information    [facility]. Schedule an appointment as soon as possible for a visit in 6 days.   Why:  Office Closed Contact information: [place] [address] [phone]        Lequita Asal, MD. Schedule an appointment as soon as possible for a visit on 01/20/2018.   Specialty:  Hematology and Oncology Why:  Office Closed  Contact information: [address] [place] [address] [phone]           Discharge Medications   Allergies as of 01/16/2018   No Known Allergies     Medication List    STOP taking these medications   aspirin 81 MG chewable tablet     TAKE these medications   acetaminophen 325 MG  tablet Commonly known as:  TYLENOL Take 2 tablets (650 mg total) by mouth every 6 (six) hours as needed for mild pain (or Fever >/= 101).   felodipine 5 MG 24 hr tablet Commonly known as:  PLENDIL Take 5 mg by mouth daily.   fluticasone 50 MCG/ACT nasal spray Commonly known as:  FLONASE Place 2 sprays into both nostrils daily.   losartan-hydrochlorothiazide 100-25 MG tablet Commonly known as:  HYZAAR Take 1 tablet by mouth daily.   omeprazole 20 MG capsule Commonly known as:  PRILOSEC Take 1 capsule by mouth daily.   oxyCODONE 5 MG immediate release tablet Commonly known as:  Oxy IR/ROXICODONE Take 1 tablet (5 mg total) by mouth every 6 (six) hours as needed for moderate pain.   potassium chloride SA 20 MEQ tablet Commonly known as:  K-DUR,KLOR-CON Take 2 tablets (40 mEq total) by mouth daily for 5 days.   simvastatin 40 MG tablet Commonly known as:  ZOCOR Take 40 mg by mouth daily.          Total Time in preparing paper work, data evaluation and todays exam - 35 minutes  Dustin Flock M.D on 01/16/2018 at 2:30 PM [facility]   Office  [phone]

## 2018-01-16 NOTE — Progress Notes (Signed)
[facility] Hematology/Oncology Progress Note  Date of admission: 01/15/2018  Hospital day:  01/16/2018  Chief Complaint: Ronnie Mcintyre is a 52 y.o. male with an anterior mediastinal mass who was admitted through the emergency room.  Subjective: Feels better.  Discomfort in chest 2 out of 10.  Social History: The patient is accompanied by a man and a woman today.  Allergies: No Known Allergies  Scheduled Medications: . aspirin  81 mg Oral Daily  . enoxaparin (LOVENOX) injection  40 mg Subcutaneous Q24H  . felodipine  5 mg Oral Daily  . fluticasone  2 spray Each Nare Daily  . losartan  100 mg Oral Daily   And  . hydrochlorothiazide  25 mg Oral Daily  . pantoprazole  40 mg Oral Daily  . potassium chloride  40 mEq Oral Q4H  . simvastatin  40 mg Oral Daily    Review of Systems: GENERAL:  Feels good. No fevers, sweats.  Weight loss of 15 pounds in 3 months. PERFORMANCE STATUS (ECOG):  1 HEENT:  No visual changes, runny nose, sore throat, mouth sores or tenderness. Lungs: No shortness of breath.  Slight cough.  No hemoptysis. Cardiac:  Sense of fullness in chest.  No chest pain, palpitations, orthopnea, or PND.  Slept in bed last night. GI:  No nausea, vomiting, diarrhea, constipation, melena or hematochezia. GU:  No urgency, frequency, dysuria, or hematuria. Musculoskeletal:  Neck and shoulder discomfort.  No muscle tenderness. Extremities:  No pain or swelling. Skin:  Rash on abdominal wall x 1 month.. Neuro:  No headache, numbness or weakness, balance or coordination issues. Endocrine:  No diabetes, thyroid issues, hot flashes or night sweats. Psych:  No mood changes, depression or anxiety. Pain:  Pain improved (2 out of 10). Review of systems:  All other systems reviewed and found to be negative.  Physical Exam: Blood pressure 116/84, pulse 93, temperature 98.5 F (36.9 C), temperature source Oral, resp. rate 20, weight 256 lb (116.1 kg), SpO2 98 %.   GENERAL:  Well developed, well nourished, gentleman sitting comfortably in the exam room in no acute distress. MENTAL STATUS:  Alert and oriented to person, place and time. HEAD:  Short dark hair with some graying.  Male pattern baldness.  Normocephalic, atraumatic, face symmetric, no Cushingoid features.  No facial plethora. EYES:  Blue eyes.  Pupils equal round and reactive to light and accomodation.  No conjunctivitis or scleral icterus. ENT:  Oropharynx clear without lesion.  Tongue normal. Mucous membranes moist.  RESPIRATORY:  Clear to auscultation without rales, wheezes or rhonchi. CARDIOVASCULAR:  Regular rate and rhythm without murmur, rub or gallop. ABDOMEN:  Soft, non-tender, with active bowel sounds, and no hepatosplenomegaly.  No masses. SKIN:  No rashes, ulcers or lesions. EXTREMITIES: No edema, no skin discoloration or tenderness.  No palpable cords. NEUROLOGICAL: Unremarkable. PSYCH:  Appropriate.   Results for orders placed or performed during the hospital encounter of 01/15/18 (from the past 48 hour(s))  Basic metabolic panel     Status: Abnormal   Collection Time: 01/15/18  6:32 AM  Result Value Ref Range   Sodium 137 135 - 145 mmol/L   Potassium 3.3 (L) 3.5 - 5.1 mmol/L   Chloride 104 98 - 111 mmol/L    Comment: Please note change in reference range.   CO2 22 22 - 32 mmol/L   Glucose, Bld 121 (H) 70 - 99 mg/dL    Comment: Please note change in reference range.   BUN 22 (  H) 6 - 20 mg/dL    Comment: Please note change in reference range.   Creatinine, Ser 1.00 0.61 - 1.24 mg/dL   Calcium 9.1 8.9 - 10.3 mg/dL   GFR calc non Af Amer >60 >60 mL/min   GFR calc Af Amer >60 >60 mL/min    Comment: (NOTE) The eGFR has been calculated using the CKD EPI equation. This calculation has not been validated in all clinical situations. eGFR's persistently <60 mL/min signify possible Chronic Kidney Disease.    Anion gap 11 5 - 15    Comment: Performed at [facility], [address] [place], [place] [address]  CBC     Status: Abnormal   Collection Time: 01/15/18  6:32 AM  Result Value Ref Range   WBC 13.0 (H) 3.8 - 10.6 K/uL   RBC 5.25 4.40 - 5.90 MIL/uL   Hemoglobin 14.9 13.0 - 18.0 g/dL   HCT 42.2 40.0 - 52.0 %   MCV 80.3 80.0 - 100.0 fL   MCH 28.4 26.0 - 34.0 pg   MCHC 35.3 32.0 - 36.0 g/dL   RDW 16.9 (H) 11.5 - 14.5 %   Platelets 336 150 - 440 K/uL    Comment: Performed at [facility], [address] [place], [place] [address]  Troponin I     Status: None   Collection Time: 01/15/18  6:32 AM  Result Value Ref Range   Troponin I <0.03 <0.03 ng/mL    Comment: Performed at [facility], [address] [place], [place] [address]  Lactate dehydrogenase     Status: Abnormal   Collection Time: 01/15/18  6:32 AM  Result Value Ref Range   LDH 273 (H) 98 - 192 U/L    Comment: Performed at [facility], [address] [place], [place] [address]  Uric acid     Status: None   Collection Time: 01/15/18  6:32 AM  Result Value Ref Range   Uric Acid, Serum 6.1 3.7 - 8.6 mg/dL    Comment: Please note change in reference range. Performed at [facility], [address] [place], [place] [address]   Troponin I     Status: None   Collection Time: 01/15/18  1:09 PM  Result Value Ref Range   Troponin I <0.03 <0.03 ng/mL    Comment: Performed at [facility], [address] [place], [place] [address]  Troponin I     Status: None   Collection Time: 01/15/18  8:38 PM  Result Value Ref Range   Troponin I <0.03 <0.03 ng/mL    Comment: Performed at [facility], [address] [place], [place] [address]  Basic metabolic panel     Status: Abnormal   Collection Time: 01/16/18  3:46 AM  Result Value Ref Range   Sodium 135 135 - 145 mmol/L   Potassium 3.1 (L) 3.5 - 5.1 mmol/L   Chloride 99 98 - 111 mmol/L    Comment: Please note change in reference range.   CO2 25 22 - 32 mmol/L    Glucose, Bld 133 (H) 70 - 99 mg/dL    Comment: Please note change in reference range.   BUN 20 6 - 20 mg/dL    Comment: Please note change in reference range.   Creatinine, Ser 0.95 0.61 - 1.24 mg/dL   Calcium 8.9 8.9 - 10.3 mg/dL   GFR calc non Af Amer >60 >60 mL/min   GFR calc Af Amer >60 >60 mL/min  Comment: (NOTE) The eGFR has been calculated using the CKD EPI equation. This calculation has not been validated in all clinical situations. eGFR's persistently <60 mL/min signify possible Chronic Kidney Disease.    Anion gap 11 5 - 15    Comment: Performed at [facility], [address] [place], [place] [address]  Protime-INR     Status: None   Collection Time: 01/16/18  3:46 AM  Result Value Ref Range   Prothrombin Time 14.3 11.4 - 15.2 seconds   INR 1.12     Comment: Performed at [facility], [address] [place], [place] [address]  Troponin I     Status: None   Collection Time: 01/16/18  3:46 AM  Result Value Ref Range   Troponin I <0.03 <0.03 ng/mL    Comment: Performed at [facility], [address] [place],  [address]   Dg Chest 2 View  Result Date: 01/15/2018 CLINICAL DATA:  Acute RIGHT chest pain. EXAM: CHEST - 2 VIEW COMPARISON:  None. FINDINGS: Prominence of the RIGHT mediastinal contour may represent ectatic/aneurysmal or tortuous ascending thoracic aorta. The remainder of the cardiomediastinal silhouette is unremarkable. There is no evidence of focal airspace disease, pulmonary edema, suspicious pulmonary nodule/mass, pleural effusion, or pneumothorax. No acute bony abnormalities are identified. IMPRESSION: Prominence of the RIGHT mediastinal contour which may represent ectatic/aneurysmal or tortuous aorta. Consider chest CT with contrast given RIGHT chest pain. No other significant abnormalities Electronically Signed   By: Margarette Canada M.D.   On: 01/15/2018 06:21   Ct Abdomen Pelvis W Contrast  Result Date:  01/16/2018 CLINICAL DATA:  Anterior mediastinal mass on a chest CTA obtained yesterday. The patient has had upper chest pain and since fullness radiating into the neck and right arm. He also has had a cough and has had a 15 lb weight loss in the past 3 months. He has had a foreign body sensation in his throat for the past 1.5 months. EXAM: CT ABDOMEN AND PELVIS WITH CONTRAST TECHNIQUE: Multidetector CT imaging of the abdomen and pelvis was performed using the standard protocol following bolus administration of intravenous contrast. CONTRAST:  100 cc Isovue 370 COMPARISON:  Chest CTA obtained yesterday. FINDINGS: Lower chest: The previously demonstrated large anterior mediastinal mass on the right is again demonstrated. There is also a small pericardial effusion with a maximum thickness of 5 mm. Also demonstrated are coronary artery calcifications. Mild linear atelectasis at the left lung base. Hepatobiliary: Probable sludge in the gallbladder as well as a gallbladder Phrygian cap. No gallbladder wall thickening or pericholecystic fluid. Unremarkable liver. Pancreas: Unremarkable. No pancreatic ductal dilatation or surrounding inflammatory changes. Spleen: Normal in size without focal abnormality. Adrenals/Urinary Tract: Small left adrenal mass measuring 1.9 x 1.6 cm on image number 36 series 2. This is mildly heterogeneous. Normal appearing right adrenal gland. Small bilateral renal cysts. These include a 1.5 cm lower pole cyst on the right, laterally, containing a small amount of mildly thickened internal septation without gross evidence of enhancement. Minimal low-density bladder wall thickening there is also medium density urine or a soft tissue mass filling the bladder, measuring 39 Hounsfield units on image number 93 of series 2. This measures 6.3 x 5.6 cm on the same image and 4.2 cm in length on sagittal image number 70. Stomach/Bowel: Unremarkable stomach, small bowel and colon. No evidence of appendicitis.  Vascular/Lymphatic: Minimal atheromatous iliac artery calcifications. No aneurysm or enlarged lymph nodes. Reproductive: Mildly enlarged prostate gland. Other: Small umbilical  hernia containing fat. Musculoskeletal: Lumbar and lower thoracic spine degenerative changes. IMPRESSION: 1. Soft tissue mass or medium density fluid filling the urinary bladder. These possibilities could be resolved with urinary tract ultrasound. 2. 1.5 cm mildly complex lower pole right renal cyst. Further evaluation with pre and post contrast MRI should be considered. Pre and post contrast CT could alternatively be performed, but would likely be of decreased accuracy given lesion size. 3. 1.9 cm indeterminate left adrenal mass. This could represent an adenoma, metastasis or small primary adrenal neoplasm. This could also be assessed at time of MRI of the kidneys. 4. Previously demonstrated large anterior mediastinal mass. 5.  Calcific coronary artery atherosclerosis. 6. Small pericardial effusion. Electronically Signed   By: Claudie Revering M.D.   On: 01/16/2018 12:43   Ct Angio Chest Aorta W And/or Wo Contrast  Result Date: 01/15/2018 CLINICAL DATA:  Chest pain. EXAM: CT ANGIOGRAPHY CHEST WITH CONTRAST TECHNIQUE: Multidetector CT imaging of the chest was performed using the standard protocol during bolus administration of intravenous contrast. Multiplanar CT image reconstructions and MIPs were obtained to evaluate the vascular anatomy. CONTRAST:  138m ISOVUE-370 IOPAMIDOL (ISOVUE-370) INJECTION 76% COMPARISON:  Radiograph of same day. FINDINGS: Cardiovascular: Preferential opacification of the thoracic aorta. No evidence of thoracic aortic aneurysm or dissection. Normal heart size. No pericardial effusion. Mediastinum/Nodes: 11.3 x 8.0 cm soft tissue mass is noted in the anterior mediastinum directly abutting the anterior portion of the ascending thoracic aorta. This is highly concerning for neoplasm or malignancy, such as lymphoma,  germ-cell tumor or possibly metastatic disease. Thyroid gland and esophagus are unremarkable. No other significant adenopathy is noted. Lungs/Pleura: Lungs are clear. No pleural effusion or pneumothorax. Upper Abdomen: No acute abnormality. Musculoskeletal: No chest wall abnormality. No acute or significant osseous findings. Review of the MIP images confirms the above findings. IMPRESSION: 11.3 x 8.0 cm anterior mediastinal mass is noted concerning for neoplasm or malignancy, such as lymphoma, germ-cell tumor or possibly metastatic disease. Tissue sampling is recommended. Electronically Signed   By: JMarijo Conception M.D.   On: 01/15/2018 07:37    Assessment:  Ronnie PODESTAis a 52y.o. male with an anterior mediastinal mass.  He presented with upper chest fullness with radiation to his neck and arm.  He has had B symptoms (sweats and weight loss) worrisome for lymphoma.  LDH is 273.  Uric acid is normal.  Chest CT angiogram on 01/15/2018 revealed an 11.3 x 8.0 cm anterior mediastinal mass.  Abdomen and pelvic CT on 01/16/2018 revealed a 6.3 x 5.6 cm soft tissue mass or medium density fluid filling the urinary bladder.  There was a 1.5 cm mildly complex lower pole right renal cyst. There was a 1.9 cm indeterminate left adrenal mass.  There was a small pericardial effusion.  Symptomatically, he is feeling better.  Exam is stable.  Plan: 1.  Oncology:  Anterior mediastinal mass without evidence of abdominal/pelvic adenopathy.  Significance of other lesions unclear.    May pursue abdominal MRI, renal ultrasound, and/or PET scan in the outpatient department.  Will review with radiology.  Discussed plan for outpatient CT guided biopsy of mediastinal mass on 01/18/2018.  Await additional tumor markers.  2.  Disposition:  Patient to be discharged home today. Pain well controlled with oxycodone 5 mg po q 6 hrs prn pain.  Anticipate biopsy tomorrow and f/u in clinic to discuss results on 01/20/2018 in  [facility] [facility]  Patient aware.   MLequita Asal MD  01/16/2018,  1:42 PM

## 2018-01-16 NOTE — Discharge Planning (Signed)
Patient IV removed. RN assessment and VS revealed stability for DC to home.  Discharge papers given, explained and educated.  Informed of suggested FU appt and patient agreed to call to set up (since being [facility] on weekend.  Pain script signed, printed and given. Other script e-scribed to [facility] ([place], [place]).  Once ready, will be wheeled to front and family transporting home via car.

## 2018-01-18 ENCOUNTER — Telehealth: Payer: Self-pay | Admitting: *Deleted

## 2018-01-18 ENCOUNTER — Other Ambulatory Visit: Payer: Self-pay | Admitting: Hematology and Oncology

## 2018-01-18 ENCOUNTER — Other Ambulatory Visit: Payer: Self-pay | Admitting: Radiology

## 2018-01-18 DIAGNOSIS — J9859 Other diseases of mediastinum, not elsewhere classified: Secondary | ICD-10-CM

## 2018-01-18 LAB — BETA HCG QUANT (REF LAB): hCG Quant: <1 m[IU]/mL (ref 0–3)

## 2018-01-18 LAB — AFP TUMOR MARKER: AFP, Serum, Tumor Marker: 2.9 ng/mL (ref 0.0–8.3)

## 2018-01-18 NOTE — Telephone Encounter (Signed)
See MD on 01/22/18 per Gaspar Bidding on 01/18/18  Appt. scheduled as requested.  Called patient and made him aware of the scheduled appt date and time.

## 2018-01-19 ENCOUNTER — Ambulatory Visit
Admission: RE | Admit: 2018-01-19 | Discharge: 2018-01-19 | Disposition: A | Discharge status: Home / Self Care | Payer: Commercial Managed Care - PPO | Source: Ambulatory Visit | Attending: Hematology and Oncology | Admitting: Hematology and Oncology

## 2018-01-19 ENCOUNTER — Ambulatory Visit
Admission: RE | Admit: 2018-01-19 | Discharge: 2018-01-19 | Disposition: A | Discharge status: Home / Self Care | Payer: Commercial Managed Care - PPO | Source: Ambulatory Visit | Attending: Interventional Radiology | Admitting: Interventional Radiology

## 2018-01-19 DIAGNOSIS — I96 Gangrene, not elsewhere classified: Secondary | ICD-10-CM | POA: Diagnosis not present

## 2018-01-19 DIAGNOSIS — C8592 Non-Hodgkin lymphoma, unspecified, intrathoracic lymph nodes: Secondary | ICD-10-CM | POA: Diagnosis not present

## 2018-01-19 DIAGNOSIS — C8522 Mediastinal (thymic) large B-cell lymphoma, intrathoracic lymph nodes: Secondary | ICD-10-CM | POA: Diagnosis not present

## 2018-01-19 DIAGNOSIS — Z9889 Other specified postprocedural states: Secondary | ICD-10-CM | POA: Insufficient documentation

## 2018-01-19 DIAGNOSIS — J9859 Other diseases of mediastinum, not elsewhere classified: Secondary | ICD-10-CM | POA: Diagnosis present

## 2018-01-19 DIAGNOSIS — Z8249 Family history of ischemic heart disease and other diseases of the circulatory system: Secondary | ICD-10-CM | POA: Insufficient documentation

## 2018-01-19 DIAGNOSIS — J95811 Postprocedural pneumothorax: Secondary | ICD-10-CM

## 2018-01-19 DIAGNOSIS — I10 Essential (primary) hypertension: Secondary | ICD-10-CM | POA: Diagnosis not present

## 2018-01-19 LAB — CBC
HCT: 45.3 % (ref 40.0–52.0)
Hemoglobin: 15.1 g/dL (ref 13.0–18.0)
MCH: 27 pg (ref 26.0–34.0)
MCHC: 33.4 g/dL (ref 32.0–36.0)
MCV: 81 fL (ref 80.0–100.0)
Platelets: 426 10*3/uL (ref 150–440)
RBC: 5.59 MIL/uL (ref 4.40–5.90)
RDW: 16.9 % — ABNORMAL HIGH (ref 11.5–14.5)
WBC: 10.4 10*3/uL (ref 3.8–10.6)

## 2018-01-19 LAB — PROTIME-INR
INR: 1.01
Prothrombin Time: 13.2 seconds (ref 11.4–15.2)

## 2018-01-19 LAB — APTT: aPTT: 33 seconds (ref 24–36)

## 2018-01-19 MED ORDER — MIDAZOLAM HCL 2 MG/2ML IJ SOLN
INTRAMUSCULAR | Status: AC | PRN
  Administered 2018-01-19 (×2): 1 mg via INTRAVENOUS

## 2018-01-19 MED ORDER — MIDAZOLAM HCL 5 MG/5ML IJ SOLN
INTRAMUSCULAR | Status: DC
Start: 2018-01-19 — End: 2018-01-19
  Filled 2018-01-19: qty 5

## 2018-01-19 MED ORDER — LIDOCAINE HCL (PF) 1 % IJ SOLN
INTRAMUSCULAR | Status: AC | PRN
  Administered 2018-01-19: 10 mL

## 2018-01-19 MED ORDER — FENTANYL CITRATE (PF) 100 MCG/2ML IJ SOLN
INTRAMUSCULAR | Status: AC
  Filled 2018-01-19: qty 4

## 2018-01-19 MED ORDER — FENTANYL CITRATE (PF) 100 MCG/2ML IJ SOLN
INTRAMUSCULAR | Status: AC | PRN
  Administered 2018-01-19 (×2): 50 ug via INTRAVENOUS

## 2018-01-19 MED ORDER — SODIUM CHLORIDE 0.9 % IV SOLN
INTRAVENOUS | Status: DC
  Administered 2018-01-19: 09:00:00 via INTRAVENOUS

## 2018-01-19 NOTE — Procedures (Signed)
Interventional Radiology Procedure Note  Procedure: CT guided biopsy of mediastinal mass.  Complications: None Recommendations:  - 1 hour observation - CXR 1 hour - Ok to shower tomorrow - Do not submerge for 7 days - Routine care   Signed,  Dulcy Fanny. Earleen Newport, DO

## 2018-01-19 NOTE — Consult Note (Signed)
Chief Complaint: Mediastinal mass  Referring Physician(s): [place]  Supervising Physician: Corrie Mckusick  Patient Status: [facility] - Out-pt  History of Present Illness: Ronnie Mcintyre is a 52 y.o. male with new diagnosis of anterior mediastinal mass.    He has been referred for evaluation for possible mediastinal mass biopsy.   Past Medical History:  Diagnosis Date  . Hypertension     Past Surgical History:  Procedure Laterality Date  . ANKLE SURGERY Right     Allergies: Patient has no known allergies.  Medications: Prior to Admission medications   Medication Sig Start Date End Date Taking? Authorizing Provider  acetaminophen (TYLENOL) 325 MG tablet Take 2 tablets (650 mg total) by mouth every 6 (six) hours as needed for mild pain (or Fever >/= 101). 01/16/18  Yes Dustin Flock, MD  felodipine (PLENDIL) 5 MG 24 hr tablet Take 5 mg by mouth daily.   Yes [provider]  fluticasone (FLONASE) 50 MCG/ACT nasal spray Place 2 sprays into both nostrils daily. 09/29/17  Yes Cook, Jayce G, DO  losartan-hydrochlorothiazide (HYZAAR) 100-25 MG tablet Take 1 tablet by mouth daily.   Yes [provider]  omeprazole (PRILOSEC) 20 MG capsule Take 1 capsule by mouth daily. 11/24/17  Yes [provider]  oxyCODONE (OXY IR/ROXICODONE) 5 MG immediate release tablet Take 1 tablet (5 mg total) by mouth every 6 (six) hours as needed for moderate pain. 01/16/18  Yes Dustin Flock, MD  potassium chloride SA (K-DUR,KLOR-CON) 20 MEQ tablet Take 2 tablets (40 mEq total) by mouth daily for 5 days. 01/16/18 01/21/18 Yes Dustin Flock, MD  simvastatin (ZOCOR) 40 MG tablet Take 40 mg by mouth daily.   Yes [provider]     Family History  Problem Relation Age of Onset  . Hypertension Mother   . Hypertension Father   . Multiple myeloma Father     Social History   Socioeconomic History  . Marital status: Single    Spouse name: Not on file  .  Number of children: Not on file  . Years of education: Not on file  . Highest education level: Not on file  Occupational History  . Not on file  Social Needs  . Financial resource strain: Not on file  . Food insecurity:    Worry: Not on file    Inability: Not on file  . Transportation needs:    Medical: Not on file    Non-medical: Not on file  Tobacco Use  . Smoking status: Never Smoker  . Smokeless tobacco: Never Used  Substance and Sexual Activity  . Alcohol use: Yes    Frequency: Never    Comment: rarely  . Drug use: No  . Sexual activity: Not on file  Lifestyle  . Physical activity:    Days per week: Not on file    Minutes per session: Not on file  . Stress: Not on file  Relationships  . Social connections:    Talks on phone: Not on file    Gets together: Not on file    Attends religious service: Not on file    Active member of club or organization: Not on file    Attends meetings of clubs or organizations: Not on file    Relationship status: Not on file  Other Topics Concern  . Not on file  Social History Narrative  . Not on file    ECOG Status: 1 - Symptomatic but completely ambulatory  Review of Systems: A  12 point ROS discussed and pertinent positives are indicated in the HPI above.  All other systems are negative.  Review of Systems  Vital Signs: BP 128/88 (BP Location: Right Arm)   Pulse 70   Temp 98.7 F (37.1 C) (Oral)   Resp 17   Ht 6' (1.829 m)   Wt 256 lb (116.1 kg)   SpO2 95%   BMI 34.72 kg/m   Physical Exam General: 52 yo male appearing  stated age.  Well-developed, well-nourished.  No distress. HEENT: Atraumatic, normocephalic.  Conjugate gaze, extra-ocular motor intact. No scleral icterus or scleral injection. No lesions on external ears, nose, lips, or gums.  Oral mucosa moist, pink.  Neck: Symmetric with no goiter enlargement.  Chest/Lungs:  Symmetric chest with inspiration/expiration.  No labored breathing.  Clear to auscultation  with no wheezes, rhonchi, or rales.  Heart:  RRR, with no third heart sounds appreciated. No JVD appreciated.  Abdomen:  Soft, NT/ND, with + bowel sounds.   Genito-urinary: Deferred Neurologic: Alert & Oriented to person, place, and time.   Normal affect and insight.  Appropriate questions.  Moving all 4 extremities with gross sensory intact.   Imaging: Dg Chest 2 View  Result Date: 01/15/2018 CLINICAL DATA:  Acute RIGHT chest pain. EXAM: CHEST - 2 VIEW COMPARISON:  None. FINDINGS: Prominence of the RIGHT mediastinal contour may represent ectatic/aneurysmal or tortuous ascending thoracic aorta. The remainder of the cardiomediastinal silhouette is unremarkable. There is no evidence of focal airspace disease, pulmonary edema, suspicious pulmonary nodule/mass, pleural effusion, or pneumothorax. No acute bony abnormalities are identified. IMPRESSION: Prominence of the RIGHT mediastinal contour which may represent ectatic/aneurysmal or tortuous aorta. Consider chest CT with contrast given RIGHT chest pain. No other significant abnormalities Electronically Signed   By: Margarette Canada M.D.   On: 01/15/2018 06:21   Ct Abdomen Pelvis W Contrast  Result Date: 01/16/2018 CLINICAL DATA:  Anterior mediastinal mass on a chest CTA obtained yesterday. The patient has had upper chest pain and since fullness radiating into the neck and right arm. He also has had a cough and has had a 15 lb weight loss in the past 3 months. He has had a foreign body sensation in his throat for the past 1.5 months. EXAM: CT ABDOMEN AND PELVIS WITH CONTRAST TECHNIQUE: Multidetector CT imaging of the abdomen and pelvis was performed using the standard protocol following bolus administration of intravenous contrast. CONTRAST:  100 cc Isovue 370 COMPARISON:  Chest CTA obtained yesterday. FINDINGS: Lower chest: The previously demonstrated large anterior mediastinal mass on the right is again demonstrated. There is also a small pericardial effusion  with a maximum thickness of 5 mm. Also demonstrated are coronary artery calcifications. Mild linear atelectasis at the left lung base. Hepatobiliary: Probable sludge in the gallbladder as well as a gallbladder Phrygian cap. No gallbladder wall thickening or pericholecystic fluid. Unremarkable liver. Pancreas: Unremarkable. No pancreatic ductal dilatation or surrounding inflammatory changes. Spleen: Normal in size without focal abnormality. Adrenals/Urinary Tract: Small left adrenal mass measuring 1.9 x 1.6 cm on image number 36 series 2. This is mildly heterogeneous. Normal appearing right adrenal gland. Small bilateral renal cysts. These include a 1.5 cm lower pole cyst on the right, laterally, containing a small amount of mildly thickened internal septation without gross evidence of enhancement. Minimal low-density bladder wall thickening there is also medium density urine or a soft tissue mass filling the bladder, measuring 39 Hounsfield units on image number 93 of series 2. This measures 6.3 x  5.6 cm on the same image and 4.2 cm in length on sagittal image number 70. Stomach/Bowel: Unremarkable stomach, small bowel and colon. No evidence of appendicitis. Vascular/Lymphatic: Minimal atheromatous iliac artery calcifications. No aneurysm or enlarged lymph nodes. Reproductive: Mildly enlarged prostate gland. Other: Small umbilical hernia containing fat. Musculoskeletal: Lumbar and lower thoracic spine degenerative changes. IMPRESSION: 1. Soft tissue mass or medium density fluid filling the urinary bladder. These possibilities could be resolved with urinary tract ultrasound. 2. 1.5 cm mildly complex lower pole right renal cyst. Further evaluation with pre and post contrast MRI should be considered. Pre and post contrast CT could alternatively be performed, but would likely be of decreased accuracy given lesion size. 3. 1.9 cm indeterminate left adrenal mass. This could represent an adenoma, metastasis or small  primary adrenal neoplasm. This could also be assessed at time of MRI of the kidneys. 4. Previously demonstrated large anterior mediastinal mass. 5.  Calcific coronary artery atherosclerosis. 6. Small pericardial effusion. Electronically Signed   By: Claudie Revering M.D.   On: 01/16/2018 12:43   Ct Angio Chest Aorta W And/or Wo Contrast  Result Date: 01/15/2018 CLINICAL DATA:  Chest pain. EXAM: CT ANGIOGRAPHY CHEST WITH CONTRAST TECHNIQUE: Multidetector CT imaging of the chest was performed using the standard protocol during bolus administration of intravenous contrast. Multiplanar CT image reconstructions and MIPs were obtained to evaluate the vascular anatomy. CONTRAST:  176m ISOVUE-370 IOPAMIDOL (ISOVUE-370) INJECTION 76% COMPARISON:  Radiograph of same day. FINDINGS: Cardiovascular: Preferential opacification of the thoracic aorta. No evidence of thoracic aortic aneurysm or dissection. Normal heart size. No pericardial effusion. Mediastinum/Nodes: 11.3 x 8.0 cm soft tissue mass is noted in the anterior mediastinum directly abutting the anterior portion of the ascending thoracic aorta. This is highly concerning for neoplasm or malignancy, such as lymphoma, germ-cell tumor or possibly metastatic disease. Thyroid gland and esophagus are unremarkable. No other significant adenopathy is noted. Lungs/Pleura: Lungs are clear. No pleural effusion or pneumothorax. Upper Abdomen: No acute abnormality. Musculoskeletal: No chest wall abnormality. No acute or significant osseous findings. Review of the MIP images confirms the above findings. IMPRESSION: 11.3 x 8.0 cm anterior mediastinal mass is noted concerning for neoplasm or malignancy, such as lymphoma, germ-cell tumor or possibly metastatic disease. Tissue sampling is recommended. Electronically Signed   By: JMarijo Conception M.D.   On: 01/15/2018 07:37    Labs:  CBC: Recent Labs    01/15/18 0632 01/19/18 0840  WBC 13.0* 10.4  HGB 14.9 15.1  HCT 42.2 45.3    PLT 336 426    COAGS: Recent Labs    01/16/18 0346 01/19/18 0840  INR 1.12 1.01  APTT  --  33    BMP: Recent Labs    11/27/17 1459 01/15/18 0632 01/16/18 0346  NA  --  137 135  K  --  3.3* 3.1*  CL  --  104 99  CO2  --  22 25  GLUCOSE  --  121* 133*  BUN  --  22* 20  CALCIUM  --  9.1 8.9  CREATININE 1.00 1.00 0.95  GFRNONAA  --  >60 >60  GFRAA  --  >60 >60    LIVER FUNCTION TESTS: No results for input(s): BILITOT, AST, ALT, ALKPHOS, PROT, ALBUMIN in the last 8760 hours.  TUMOR MARKERS: No results for input(s): AFPTM, CEA, CA199, CHROMGRNA in the last 8760 hours.  Assessment and Plan:  Mr JKornegayhas a new diagnosis of anterior mediastinal mass.   CT shows  large mass.    DDx includes lymphoma.    Agree that there is some urgency for tissue diagnosis, for guidance of therapy.   Risks and benefits discussed with the patient including, but not limited to bleeding, hemoptysis, respiratory failure requiring intubation, infection, pneumothorax requiring chest tube placement, stroke from air embolism or even death.  All of the patient's questions were answered, patient is agreeable to proceed. Consent signed and in chart.  Thank you for this interesting consult.  I greatly enjoyed meeting Ronnie Mcintyre and look forward to participating in their care.  A copy of this report was sent to the requesting provider on this date.  Electronically Signed: Corrie Mckusick, DO 01/19/2018, 10:07 AM   I spent a total of  40 Minutes   in face to face in clinical consultation, greater than 50% of which was counseling/coordinating care for anterior mediastinal mass, possible mass biopsy.

## 2018-01-20 ENCOUNTER — Other Ambulatory Visit: Payer: Self-pay | Admitting: Hematology and Oncology

## 2018-01-20 ENCOUNTER — Telehealth: Payer: Self-pay

## 2018-01-20 DIAGNOSIS — J9859 Other diseases of mediastinum, not elsewhere classified: Secondary | ICD-10-CM

## 2018-01-20 NOTE — Telephone Encounter (Signed)
EMMI Follow-up: Noted on the report that the patient had other questions/problems.  Left a VM for Mr. Kasler to call me at his convenience.  Will follow-up again.

## 2018-01-21 ENCOUNTER — Telehealth: Payer: Self-pay

## 2018-01-21 NOTE — Telephone Encounter (Signed)
EMMI Follow-up: 2nd follow-up call made to Ronnie Mcintyre but left a message for him to call me at his convenience if he had any needs.

## 2018-01-22 ENCOUNTER — Other Ambulatory Visit: Payer: Self-pay | Admitting: Hematology and Oncology

## 2018-01-22 ENCOUNTER — Inpatient Hospital Stay: Payer: Commercial Managed Care - PPO | Attending: Hematology and Oncology | Admitting: Hematology and Oncology

## 2018-01-22 VITALS — BP 129/90 | HR 103 | Temp 97.7°F | Resp 18 | Wt 260.2 lb

## 2018-01-22 DIAGNOSIS — R61 Generalized hyperhidrosis: Secondary | ICD-10-CM | POA: Diagnosis not present

## 2018-01-22 DIAGNOSIS — D383 Neoplasm of uncertain behavior of mediastinum: Secondary | ICD-10-CM | POA: Diagnosis not present

## 2018-01-22 DIAGNOSIS — R21 Rash and other nonspecific skin eruption: Secondary | ICD-10-CM

## 2018-01-22 DIAGNOSIS — R634 Abnormal weight loss: Secondary | ICD-10-CM | POA: Diagnosis not present

## 2018-01-22 DIAGNOSIS — R9341 Abnormal radiologic findings on diagnostic imaging of renal pelvis, ureter, or bladder: Secondary | ICD-10-CM

## 2018-01-22 DIAGNOSIS — D4412 Neoplasm of uncertain behavior of left adrenal gland: Secondary | ICD-10-CM | POA: Diagnosis not present

## 2018-01-22 DIAGNOSIS — R631 Polydipsia: Secondary | ICD-10-CM | POA: Diagnosis not present

## 2018-01-22 DIAGNOSIS — C8510 Unspecified B-cell lymphoma, unspecified site: Secondary | ICD-10-CM | POA: Insufficient documentation

## 2018-01-22 DIAGNOSIS — Z5112 Encounter for antineoplastic immunotherapy: Secondary | ICD-10-CM | POA: Insufficient documentation

## 2018-01-22 DIAGNOSIS — Z5111 Encounter for antineoplastic chemotherapy: Secondary | ICD-10-CM | POA: Insufficient documentation

## 2018-01-22 DIAGNOSIS — N281 Cyst of kidney, acquired: Secondary | ICD-10-CM | POA: Diagnosis not present

## 2018-01-22 DIAGNOSIS — I313 Pericardial effusion (noninflammatory): Secondary | ICD-10-CM

## 2018-01-22 DIAGNOSIS — R948 Abnormal results of function studies of other organs and systems: Secondary | ICD-10-CM | POA: Diagnosis not present

## 2018-01-22 DIAGNOSIS — Z5189 Encounter for other specified aftercare: Secondary | ICD-10-CM | POA: Insufficient documentation

## 2018-01-22 DIAGNOSIS — I10 Essential (primary) hypertension: Secondary | ICD-10-CM | POA: Insufficient documentation

## 2018-01-22 DIAGNOSIS — J9859 Other diseases of mediastinum, not elsewhere classified: Secondary | ICD-10-CM

## 2018-01-22 DIAGNOSIS — N3289 Other specified disorders of bladder: Secondary | ICD-10-CM

## 2018-01-22 NOTE — Progress Notes (Signed)
[facility] day:  01/22/2018  Chief Complaint: Ronnie Mcintyre is a 52 y.o. male with an anterior mediastinal mass who is seen for assessment after interval hospitalization and CT guided biopsy.  HPI:  He was admitted to [facility] from 01/15/2018 - 01/16/2018.  He presented with chest pain.  He described a sensation of something stuck in his throat for 1 1/2 months.  He had been seen by ENT twice.  Upper fibroscopic evaluation was negative.  Working diagnosis was reflux.    He described becoming more uncomfortable with pain into his upper chest (upper third of sternum), neck, and right shoulder blade.  He described his right arm hurting and inability to lay on that side.  He had needed to sleep in a chair for the past 4-5 nights.  He noted sweats on his head at night x 1 week.  He noted a 15 pound weight loss in the past 3 months.  He had a cough.  He presented to the [facility] ER with chest pain (sense of fullness in upper chest) with radiation into neck and arm.  EKG was unremarkable.  CXR revealed prominence of the RIGHT mediastinal contour which may represent ectatic/aneurysmal or tortuous aorta.  Chest CT angiogram on 01/15/2018 revealed an 11.3 x 8.0 cm anterior mediastinal mass.  Echo on 01/15/2018 revealed an EF of 55-60% and no wall motion abnormalities.  He was discharged on 01/16/2018.  He underwent CT guided anterior mediastinal mass biopsy on 01/19/2018.  Pathology revealed necrotic tissue.  He is scheduled for PET scan on 01/26/2018.  Symptomatically, he feels good.  He continues to struggle at night.  He was able to sleep 5 hours in bed last night.  Previously, he sat in a chair.  He describes head sweats on Sunday - Tuesday night this week.  Weight is stable.   Past Medical History:  Diagnosis Date  . Hypertension     Past Surgical History:  Procedure Laterality Date  . ANKLE SURGERY Right     Family History  Problem Relation Age  of Onset  . Hypertension Mother   . Hypertension Father   . Multiple myeloma Father     Social History:  reports that he has never smoked. He has never used smokeless tobacco. He reports that he drinks alcohol. He reports that he does not use drugs.  He drinks a beer 1-2 x/week.  He works for [employer].  He lives in [place].  His is accompanied by his mother today.  Allergies: No Known Allergies  Current Medications: Current Outpatient Medications  Medication Sig Dispense Refill  . acetaminophen (TYLENOL) 325 MG tablet Take 2 tablets (650 mg total) by mouth every 6 (six) hours as needed for mild pain (or Fever >/= 101).    . felodipine (PLENDIL) 5 MG 24 hr tablet Take 5 mg by mouth daily.    . fluticasone (FLONASE) 50 MCG/ACT nasal spray Place 2 sprays into both nostrils daily. 16 g 0  . losartan-hydrochlorothiazide (HYZAAR) 100-25 MG tablet Take 1 tablet by mouth daily.    [name] omeprazole (PRILOSEC) 20 MG capsule Take 1 capsule by mouth daily.  3  . oxyCODONE (OXY IR/ROXICODONE) 5 MG immediate release tablet Take 1 tablet (5 mg total) by mouth every 6 (six) hours as needed for moderate pain. 30 tablet 0  . simvastatin (ZOCOR) 40 MG tablet Take 40 mg by mouth daily.    . potassium chloride SA (K-DUR,KLOR-CON) 20  MEQ tablet Take 2 tablets (40 mEq total) by mouth daily for 5 days. 10 tablet 0   No current facility-administered medications for this visit.     Review of Systems:  GENERAL:  Feels "good".  No fevers.  Head sweats.  Weight loss of 15 pounds in 3 months. PERFORMANCE STATUS (ECOG):  1 HEENT:  No visual changes, runny nose, sore throat, mouth sores or tenderness. Lungs: No shortness of breath or cough.  No hemoptysis. Cardiac:  Sense of chest fullness (no change).  No chest pain, palpitations, orthopnea, or PND.  Able to sleep flat for a few hours. GI:  No nausea, vomiting, diarrhea, constipation, melena or hematochezia. GU:  No urgency, frequency, dysuria, or  hematuria. Musculoskeletal:  No back pain.  No joint pain.  No muscle tenderness. Extremities:  No pain or swelling. Skin:  Rash on abdomen x 1 month.  No after bath itching. Neuro:  No headache, numbness or weakness, balance or coordination issues. Endocrine:  No diabetes, thyroid issues, hot flashes or night sweats. Psych:  No mood changes, depression or anxiety. Pain:  Discomfort in chest (no change). Review of systems:  All other systems reviewed and found to be negative.  Physical Exam: Blood pressure 129/90, pulse (!) 103, temperature 97.7 F (36.5 C), temperature source Tympanic, resp. rate 18, weight 260 lb 3.2 oz (118 kg). GENERAL:  Well developed, well nourished, gentleman sitting comfortably in the exam room in no acute distress. MENTAL STATUS:  Alert and oriented to person, place and time. HEAD:  Short dark hair.  Goatee.  Male pattern baldness.  Normocephalic, atraumatic, face symmetric, no Cushingoid features. EYES:  Blue eyes.  Pupils equal round and reactive to light and accomodation.  No conjunctivitis or scleral icterus. ENT:  Oropharynx clear without lesion.  Tongue normal. Mucous membranes moist.  RESPIRATORY:  Clear to auscultation without rales, wheezes or rhonchi. CARDIOVASCULAR:  Regular rate and rhythm without murmur, rub or gallop. ABDOMEN:  Soft, non-tender, with active bowel sounds, and no hepatosplenomegaly.  No masses. SKIN:  Patchy macular non-confluent abdominal rash above waistline laterally. EXTREMITIES: No edema, no skin discoloration or tenderness.  No palpable cords. LYMPH NODES: No palpable cervical, supraclavicular, axillary or inguinal adenopathy  NEUROLOGICAL: Unremarkable. PSYCH:  Appropriate.   No visits with results within 3 Day(s) from this visit.  Latest known visit with results is:  Hospital Outpatient Visit on 01/19/2018  Component Date Value Ref Range Status  . aPTT 01/19/2018 33  24 - 36 seconds Final   Performed at [facility], [address] [place], [place] [address]  . WBC 01/19/2018 10.4  3.8 - 10.6 K/uL Final  . RBC 01/19/2018 5.59  4.40 - 5.90 MIL/uL Final  . Hemoglobin 01/19/2018 15.1  13.0 - 18.0 g/dL Final  . HCT 01/19/2018 45.3  40.0 - 52.0 % Final  . MCV 01/19/2018 81.0  80.0 - 100.0 fL Final  . MCH 01/19/2018 27.0  26.0 - 34.0 pg Final  . MCHC 01/19/2018 33.4  32.0 - 36.0 g/dL Final  . RDW 01/19/2018 16.9* 11.5 - 14.5 % Final  . Platelets 01/19/2018 426  150 - 440 K/uL Final   Performed at [facility], [address], [place], [place] [address]  . Prothrombin Time 01/19/2018 13.2  11.4 - 15.2 seconds Final  . INR 01/19/2018 1.01   Final   Performed at [facility], [address] [place],  [address]    Assessment:  Ronnie Mcintyre is  a 52 y.o. male with an anterior mediastinal mass s/p CT guided biopsy on 01/19/2018.  Pathology revealed necrotic tissue.  He presented with upper chest fullness with radiation to his neck and arm. He has had B symptoms (sweats and weight loss) worrisome for lymphoma. LDHis 273. Uric acid was normal.  Beta-HCG and AFP were normal on 01/16/2018.  Chest CT angiogramon 01/15/2018 revealed an 11.3 x 8.0 cm anterior mediastinal mass.  Abdomen and pelvic CT on 01/16/2018 revealed a 6.3 x 5.6 cm soft tissue mass or medium density fluid filling the urinary bladder.  There was a 1.5 cm mildly complex lower pole right renal cyst. There was a 1.9 cm indeterminate left adrenal mass.  There was a small pericardial effusion.  Symptomatically, he continues to experience chest fullness and head sweats.  He is able to lay flat for several hours at nigh.  Exam reveals a patchy macular non-confluent abdominal rash above waistline laterally.  He has no facial plethora.  Plan: 1.  Discuss interval CT guided biopsy- necrotic tissue.  Sample sent to [facility].  Flow negative.  Discuss plan to obtain tissue after PET scan guided biopsy into a metabolic  region in the mediastinal mass. 2.  Discuss plan for PET scan next week.  Move up if possible. 3.  Discuss plan for bone marrow aspirate and if biopsy returns + lymphoma. 4.  Discuss soft tissue mass versus fluid density in bladder and right renal cyst.  Discuss renal ultrasound while awaiting work-up of mediastinal mass.  If a solid mass is detected, will refer patient to urology. 5.  Renal ultrasound- next available. 6.  Tumor board on 01/28/2018. 7.  Anticipate CT guided biopsy based on PET scan for 01/27/2018. 8.  RTC on 01/29/2018 for NP assessment, review PET scan and biopsy. If lymphoma, will set up bone marrow.   Lequita Asal, MD  01/22/2018, 1:35 PM

## 2018-01-22 NOTE — Progress Notes (Signed)
Patient is here for results of testing

## 2018-01-24 ENCOUNTER — Encounter: Payer: Self-pay | Admitting: Hematology and Oncology

## 2018-01-25 ENCOUNTER — Other Ambulatory Visit: Payer: Self-pay | Admitting: Hematology and Oncology

## 2018-01-25 ENCOUNTER — Ambulatory Visit
Admission: RE | Admit: 2018-01-25 | Discharge: 2018-01-25 | Disposition: A | Discharge status: Home / Self Care | Payer: Commercial Managed Care - PPO | Source: Ambulatory Visit | Attending: Hematology and Oncology | Admitting: Hematology and Oncology

## 2018-01-25 DIAGNOSIS — N329 Bladder disorder, unspecified: Secondary | ICD-10-CM | POA: Diagnosis present

## 2018-01-25 DIAGNOSIS — N3289 Other specified disorders of bladder: Secondary | ICD-10-CM

## 2018-01-25 DIAGNOSIS — N281 Cyst of kidney, acquired: Secondary | ICD-10-CM | POA: Insufficient documentation

## 2018-01-26 ENCOUNTER — Telehealth: Payer: Self-pay | Admitting: *Deleted

## 2018-01-26 ENCOUNTER — Ambulatory Visit (HOSPITAL_COMMUNITY)
Admission: RE | Admit: 2018-01-26 | Discharge: 2018-01-26 | Disposition: A | Discharge status: Home / Self Care | Payer: Commercial Managed Care - PPO | Source: Ambulatory Visit | Attending: Hematology and Oncology | Admitting: Hematology and Oncology

## 2018-01-26 ENCOUNTER — Other Ambulatory Visit: Payer: Commercial Managed Care - PPO

## 2018-01-26 DIAGNOSIS — R222 Localized swelling, mass and lump, trunk: Secondary | ICD-10-CM | POA: Diagnosis not present

## 2018-01-26 DIAGNOSIS — J9859 Other diseases of mediastinum, not elsewhere classified: Secondary | ICD-10-CM

## 2018-01-26 DIAGNOSIS — I700 Atherosclerosis of aorta: Secondary | ICD-10-CM | POA: Insufficient documentation

## 2018-01-26 DIAGNOSIS — I77810 Thoracic aortic ectasia: Secondary | ICD-10-CM | POA: Diagnosis not present

## 2018-01-26 DIAGNOSIS — D3502 Benign neoplasm of left adrenal gland: Secondary | ICD-10-CM | POA: Diagnosis not present

## 2018-01-26 LAB — GLUCOSE, CAPILLARY: Glucose-Capillary: 99 mg/dL (ref 70–99)

## 2018-01-26 MED ORDER — FLUDEOXYGLUCOSE F - 18 (FDG) INJECTION
13.7600 | Freq: Once | INTRAVENOUS | Status: AC | PRN
  Administered 2018-01-26: 13.76 via INTRAVENOUS

## 2018-01-26 NOTE — Telephone Encounter (Signed)
Per Gaspar Bidding to cancel pt appt on 01/28/18 @ 1:30 I tried to call patient to make him aware. However, it was during the time of his  PET scan. So, I left him a message on his vmail making him aware that the scheduled 01/28/18 appt was cancelled And will be R/S after he has his BIOPSY.

## 2018-01-28 ENCOUNTER — Inpatient Hospital Stay: Payer: Commercial Managed Care - PPO | Admitting: Urgent Care

## 2018-01-28 ENCOUNTER — Telehealth: Payer: Self-pay | Admitting: Urgent Care

## 2018-01-28 ENCOUNTER — Other Ambulatory Visit: Payer: Self-pay | Admitting: Urgent Care

## 2018-01-28 DIAGNOSIS — R937 Abnormal findings on diagnostic imaging of other parts of musculoskeletal system: Secondary | ICD-10-CM

## 2018-01-28 DIAGNOSIS — J9859 Other diseases of mediastinum, not elsewhere classified: Secondary | ICD-10-CM

## 2018-01-28 NOTE — Telephone Encounter (Signed)
Re: Tumor board and plans going forward  Patient dicussed at multidisciplinary tumor board on 01/28/2018. IR does did not feel that they would be able to get sufficient sample for testing given the past sampling that was non-diagnostic (only showed necrosis). PET reviewed that advised sampling should be attempted to superior most aspect of mass, however according to [place], MD patient should be referred to CVTS. Additionally, due to the concerns for infiltrative marrow process, bone marrow biopsy was deemed appropriate.   Call placed to patient. Review renal ultrasound, PET scan, and tumor board discussions. He verbalized understanding and wishes to proceed with further testing as recommended by multidisciplinary team. Patient appreciative of communication from his oncology care team.   Plans: 1. Cancel CT guided biopsy as it would likely result in another non-diagnostic sample.  2. Referral placed to Dr. Genevive Bi for evaluation and biopsy. Patient to be seen in consult on 02/02/2018 @ 1545. 3. Order placed for bone marrow biopsy. Patient scheduled for procedure on 02/03/2018 @ 0830. 4. [facility] will be in touch with patient as confirmatory testing is completed. Will keep patient informed of "next steps" in his diagnosis and treatment plan.   Honor Loh, MSN, APRN, FNP-C, CEN Oncology/Hematology Nurse Practitioner  [facility] 01/28/18, 4:45 PM

## 2018-01-29 ENCOUNTER — Other Ambulatory Visit: Payer: Self-pay

## 2018-01-29 ENCOUNTER — Telehealth: Payer: Self-pay | Admitting: Urgent Care

## 2018-01-29 NOTE — Telephone Encounter (Signed)
Re: Pathology results  Call received from Dr. Reuel Derby to discuss biopsy results. Sample unable to be tested here. Tissue was sent to histopathology lab at [facility] [place] for further evaluation. Results received today.   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   (+) for CD20, BCL-2, BCL-6, and MUM-1  (-) for CD30, CD3, CD10, c-MYC, EBV ISH, AE1/3, and PAX-8  FISH for MYC rearrangement is being attempted on remaining sample.   Will communicate theses findings to primary oncologist Mike Gip). Patient does not need to have another tissue biopsy at this time. Will cancel his consult with Dr. Genevive Bi next week on 01/23/2018. Will have patient proceed with biopsy of bone marrow testing, as his marrow was hypermetabolic on PET. Also, in the absence of CD30 expression, a primary mediastinal large B-cell lymphoma is unlikely.   Honor Loh, MSN, APRN, FNP-C, CEN Oncology/Hematology Nurse Practitioner  [facility] 01/29/18, 5:12 PM

## 2018-01-29 NOTE — Telephone Encounter (Signed)
Re: Biopsy results  Call placed to patient to discuss biopsy results. Patient was not available via phone. LMOM to advise patient that sample was able to be tested at [facility], and that he WOULD NOT need to see Dr. Genevive Bi for repeat biopsy. I advised patient that he would still need to proceed with bone marrow biopsy as planned next week.   I was generic in my terminology over the phone. I did not provide him with a diagnosis, rather only pertinent information that is required in order to proceed with the next steps in his diagnosis.   Patient advised that following the bone marrow testing, we would be in contact with him to schedule a RTC appointment to discuss results and treatment plans. In the interim, I asked him to call the clinic, or send a message via MyChart should be have questions or concerns.   Honor Loh, MSN, APRN, FNP-C, CEN Oncology/Hematology Nurse Practitioner  [facility] 01/29/18, 5:16 PM

## 2018-02-02 ENCOUNTER — Ambulatory Visit: Payer: Self-pay | Admitting: Cardiothoracic Surgery

## 2018-02-02 ENCOUNTER — Other Ambulatory Visit: Payer: Self-pay | Admitting: Radiology

## 2018-02-03 ENCOUNTER — Ambulatory Visit
Admission: RE | Admit: 2018-02-03 | Discharge: 2018-02-03 | Disposition: A | Discharge status: Home / Self Care | Payer: Commercial Managed Care - PPO | Source: Ambulatory Visit | Attending: Urgent Care | Admitting: Urgent Care

## 2018-02-03 ENCOUNTER — Ambulatory Visit: Payer: Commercial Managed Care - PPO

## 2018-02-03 ENCOUNTER — Other Ambulatory Visit (HOSPITAL_COMMUNITY)
Admission: RE | Admit: 2018-02-03 | Discharge: 2018-02-03 | Disposition: A | Discharge status: Home / Self Care | Payer: Commercial Managed Care - PPO | Source: Ambulatory Visit | Attending: Hematology and Oncology | Admitting: Hematology and Oncology

## 2018-02-03 DIAGNOSIS — R4702 Dysphasia: Secondary | ICD-10-CM | POA: Insufficient documentation

## 2018-02-03 DIAGNOSIS — I10 Essential (primary) hypertension: Secondary | ICD-10-CM | POA: Diagnosis not present

## 2018-02-03 DIAGNOSIS — Z8249 Family history of ischemic heart disease and other diseases of the circulatory system: Secondary | ICD-10-CM | POA: Insufficient documentation

## 2018-02-03 DIAGNOSIS — Z79899 Other long term (current) drug therapy: Secondary | ICD-10-CM | POA: Insufficient documentation

## 2018-02-03 DIAGNOSIS — J9859 Other diseases of mediastinum, not elsewhere classified: Secondary | ICD-10-CM | POA: Insufficient documentation

## 2018-02-03 DIAGNOSIS — R937 Abnormal findings on diagnostic imaging of other parts of musculoskeletal system: Secondary | ICD-10-CM

## 2018-02-03 DIAGNOSIS — Z7952 Long term (current) use of systemic steroids: Secondary | ICD-10-CM | POA: Insufficient documentation

## 2018-02-03 DIAGNOSIS — I700 Atherosclerosis of aorta: Secondary | ICD-10-CM | POA: Insufficient documentation

## 2018-02-03 DIAGNOSIS — Z9889 Other specified postprocedural states: Secondary | ICD-10-CM | POA: Diagnosis not present

## 2018-02-03 DIAGNOSIS — Z79891 Long term (current) use of opiate analgesic: Secondary | ICD-10-CM | POA: Diagnosis not present

## 2018-02-03 DIAGNOSIS — Z808 Family history of malignant neoplasm of other organs or systems: Secondary | ICD-10-CM | POA: Diagnosis not present

## 2018-02-03 DIAGNOSIS — R222 Localized swelling, mass and lump, trunk: Secondary | ICD-10-CM | POA: Insufficient documentation

## 2018-02-03 DIAGNOSIS — N281 Cyst of kidney, acquired: Secondary | ICD-10-CM | POA: Insufficient documentation

## 2018-02-03 DIAGNOSIS — R0789 Other chest pain: Secondary | ICD-10-CM | POA: Insufficient documentation

## 2018-02-03 DIAGNOSIS — Z7982 Long term (current) use of aspirin: Secondary | ICD-10-CM | POA: Insufficient documentation

## 2018-02-03 DIAGNOSIS — I313 Pericardial effusion (noninflammatory): Secondary | ICD-10-CM | POA: Diagnosis not present

## 2018-02-03 LAB — CBC WITH DIFFERENTIAL/PLATELET
Basophils Absolute: 0.1 10*3/uL (ref 0–0.1)
Basophils Relative: 1 %
EOS ABS: 0.2 10*3/uL (ref 0–0.7)
Eosinophils Relative: 3 %
HEMATOCRIT: 42.5 % (ref 40.0–52.0)
HEMOGLOBIN: 14.8 g/dL (ref 13.0–18.0)
LYMPHS ABS: 0.9 10*3/uL — AB (ref 1.0–3.6)
LYMPHS PCT: 11 %
MCH: 28.1 pg (ref 26.0–34.0)
MCHC: 34.8 g/dL (ref 32.0–36.0)
MCV: 80.6 fL (ref 80.0–100.0)
MONOS PCT: 7 %
Monocytes Absolute: 0.6 10*3/uL (ref 0.2–1.0)
NEUTROS ABS: 6.3 10*3/uL (ref 1.4–6.5)
Neutrophils Relative %: 78 %
Platelets: 242 10*3/uL (ref 150–440)
RBC: 5.27 MIL/uL (ref 4.40–5.90)
RDW: 17.4 % — ABNORMAL HIGH (ref 11.5–14.5)
WBC: 8.1 10*3/uL (ref 3.8–10.6)

## 2018-02-03 LAB — PROTIME-INR
INR: 0.97
PROTHROMBIN TIME: 12.8 s (ref 11.4–15.2)

## 2018-02-03 MED ORDER — MIDAZOLAM HCL 5 MG/5ML IJ SOLN
INTRAMUSCULAR | Status: AC | PRN
  Administered 2018-02-03 (×3): 1 mg via INTRAVENOUS
  Administered 2018-02-03 (×2): 0.5 mg via INTRAVENOUS

## 2018-02-03 MED ORDER — LIDOCAINE HCL (PF) 1 % IJ SOLN
INTRAMUSCULAR | Status: AC | PRN
  Administered 2018-02-03: 10 mL

## 2018-02-03 MED ORDER — FENTANYL CITRATE (PF) 100 MCG/2ML IJ SOLN
INTRAMUSCULAR | Status: AC | PRN
  Administered 2018-02-03 (×4): 25 ug via INTRAVENOUS
  Administered 2018-02-03: 50 ug via INTRAVENOUS

## 2018-02-03 MED ORDER — FENTANYL CITRATE (PF) 100 MCG/2ML IJ SOLN
INTRAMUSCULAR | Status: AC
  Filled 2018-02-03: qty 4

## 2018-02-03 MED ORDER — SODIUM CHLORIDE 0.9 % IV SOLN
INTRAVENOUS | Status: DC
  Administered 2018-02-03: 08:00:00 via INTRAVENOUS

## 2018-02-03 MED ORDER — MIDAZOLAM HCL 5 MG/5ML IJ SOLN
INTRAMUSCULAR | Status: AC
  Filled 2018-02-03: qty 5

## 2018-02-03 MED ORDER — HEPARIN SOD (PORK) LOCK FLUSH 100 UNIT/ML IV SOLN
INTRAVENOUS | Status: AC
  Filled 2018-02-03: qty 5

## 2018-02-03 NOTE — Procedures (Signed)
Pre-procedure Diagnosis: Concern for lymphoma Post-procedure Diagnosis: Same  Technically successful CT guided bone marrow aspiration and biopsy of left iliac crest.   Complications: None Immediate  EBL: None  SignedSandi Mariscal Pager: [phone] 02/03/2018, 9:38 AM

## 2018-02-03 NOTE — Consult Note (Signed)
Chief Complaint: Anterior mediastinal mass  Referring Physician(s): Irving Burton, Gaspar Bidding  Patient Status: [facility] - Out-pt  History of Present Illness: Ronnie Mcintyre is a 52 y.o. male with past medical history significant for hypertension who was found to have an anterior mediastinal mass on CT scan obtained for the work-up of chest pain and dysphasia.  Patient underwent CT-guided biopsy of the anterior mediastinal mass by Dr. Earleen Newport on 01/19/2018 however the biopsy only yielded necrotic tissue.  Patient subsequent underwent PET scan on 01/26/2018 which demonstrated diffuse hypermetabolic activity throughout the anterior mediastinal mass as well as throughout the imaged osseous structures.  As such, patient presented today for CT-guided bone marrow biopsy and aspiration for tissue diagnostic purposes.  Patient is accompanied by his mother though serves as his own historian.  Patient continues to complain of chest pain and dysphasia.  He Is otherwise without complaint.  No shortness of breath.  No fever or chills.    Past Medical History:  Diagnosis Date  . Hypertension     Past Surgical History:  Procedure Laterality Date  . ANKLE SURGERY Right     Allergies: Patient has no known allergies.  Medications: Prior to Admission medications   Medication Sig Start Date End Date Taking? Authorizing Provider  acetaminophen (TYLENOL) 325 MG tablet Take 2 tablets (650 mg total) by mouth every 6 (six) hours as needed for mild pain (or Fever >/= 101). 01/16/18  Yes Dustin Flock, MD  felodipine (PLENDIL) 5 MG 24 hr tablet Take 5 mg by mouth daily.   Yes [provider]  losartan-hydrochlorothiazide (HYZAAR) 100-25 MG tablet Take 1 tablet by mouth daily.   Yes [provider]  omeprazole (PRILOSEC) 20 MG capsule Take 1 capsule by mouth daily. 11/24/17  Yes [provider]  simvastatin (ZOCOR) 40 MG tablet Take 40 mg by mouth daily.   Yes [provider]  aspirin 81 MG tablet Take by mouth.    [provider]  fluticasone (FLONASE) 50 MCG/ACT nasal spray Place 2 sprays into both nostrils daily. 09/29/17   Coral Spikes, DO  loratadine (CLARITIN) 10 MG tablet Take by mouth.    [provider]  methylPREDNISolone (MEDROL DOSEPAK) 4 MG TBPK tablet TAKE 6 TABLETS ON DAY 1 AS DIRECTED ON PACKAGE AND DECREASE BY 1 TAB EACH DAY FOR A TOTAL OF 6 DAYS 11/24/17   [provider]  oxyCODONE (OXY IR/ROXICODONE) 5 MG immediate release tablet Take 1 tablet (5 mg total) by mouth every 6 (six) hours as needed for moderate pain. 01/16/18   Dustin Flock, MD  potassium chloride SA (K-DUR,KLOR-CON) 20 MEQ tablet Take 2 tablets (40 mEq total) by mouth daily for 5 days. 01/16/18 01/21/18  Dustin Flock, MD  Prenat-FeCbn-FeBisg-FA-Omega (MULTIVITAMIN/MINERALS PO) Take by mouth.    [provider]     Family History  Problem Relation Age of Onset  . Hypertension Mother   . Hypertension Father   . Multiple myeloma Father     Social History   Socioeconomic History  . Marital status: Single    Spouse name: Not on file  . Number of children: Not on file  . Years of education: Not on file  . Highest education level: Not on file  Occupational History  . Not on file  Social Needs  . Financial resource strain: Not on file  . Food insecurity:    Worry: Not on file    Inability: Not on file  . Transportation needs:  Medical: Not on file    Non-medical: Not on file  Tobacco Use  . Smoking status: Never Smoker  . Smokeless tobacco: Never Used  Substance and Sexual Activity  . Alcohol use: Yes    Frequency: Never    Comment: rarely  . Drug use: No  . Sexual activity: Not on file  Lifestyle  . Physical activity:    Days per week: Not on file    Minutes per session: Not on file  . Stress: Not on file  Relationships  . Social connections:    Talks on phone: Not on file    Gets together: Not on file    Attends  religious service: Not on file    Active member of club or organization: Not on file    Attends meetings of clubs or organizations: Not on file    Relationship status: Not on file  Other Topics Concern  . Not on file  Social History Narrative  . Not on file    ECOG Status: 1 - Symptomatic but completely ambulatory  Review of Systems: A 12 point ROS discussed and pertinent positives are indicated in the HPI above.  All other systems are negative.  Review of Systems  Constitutional: Positive for fever and unexpected weight change.  Respiratory: Negative.   Cardiovascular: Positive for chest pain.  Gastrointestinal:       Difficulty swallowing.    Vital Signs: BP (!) 120/93   Pulse 83   Temp 99 F (37.2 C) (Oral)   Resp 14   SpO2 97%   Physical Exam  Constitutional: He appears well-developed and well-nourished.  Cardiovascular: Normal rate and regular rhythm.  Pulmonary/Chest: Effort normal and breath sounds normal.  Psychiatric: He has a normal mood and affect. His behavior is normal. Thought content normal.  Nursing note and vitals reviewed.   Imaging: Dg Chest 2 View  Result Date: 01/15/2018 CLINICAL DATA:  Acute RIGHT chest pain. EXAM: CHEST - 2 VIEW COMPARISON:  None. FINDINGS: Prominence of the RIGHT mediastinal contour may represent ectatic/aneurysmal or tortuous ascending thoracic aorta. The remainder of the cardiomediastinal silhouette is unremarkable. There is no evidence of focal airspace disease, pulmonary edema, suspicious pulmonary nodule/mass, pleural effusion, or pneumothorax. No acute bony abnormalities are identified. IMPRESSION: Prominence of the RIGHT mediastinal contour which may represent ectatic/aneurysmal or tortuous aorta. Consider chest CT with contrast given RIGHT chest pain. No other significant abnormalities Electronically Signed   By: Margarette Canada M.D.   On: 01/15/2018 06:21   Ct Abdomen Pelvis W Contrast  Result Date: 01/16/2018 CLINICAL DATA:   Anterior mediastinal mass on a chest CTA obtained yesterday. The patient has had upper chest pain and since fullness radiating into the neck and right arm. He also has had a cough and has had a 15 lb weight loss in the past 3 months. He has had a foreign body sensation in his throat for the past 1.5 months. EXAM: CT ABDOMEN AND PELVIS WITH CONTRAST TECHNIQUE: Multidetector CT imaging of the abdomen and pelvis was performed using the standard protocol following bolus administration of intravenous contrast. CONTRAST:  100 cc Isovue 370 COMPARISON:  Chest CTA obtained yesterday. FINDINGS: Lower chest: The previously demonstrated large anterior mediastinal mass on the right is again demonstrated. There is also a small pericardial effusion with a maximum thickness of 5 mm. Also demonstrated are coronary artery calcifications. Mild linear atelectasis at the left lung base. Hepatobiliary: Probable sludge in the gallbladder as well as a gallbladder Phrygian cap.  No gallbladder wall thickening or pericholecystic fluid. Unremarkable liver. Pancreas: Unremarkable. No pancreatic ductal dilatation or surrounding inflammatory changes. Spleen: Normal in size without focal abnormality. Adrenals/Urinary Tract: Small left adrenal mass measuring 1.9 x 1.6 cm on image number 36 series 2. This is mildly heterogeneous. Normal appearing right adrenal gland. Small bilateral renal cysts. These include a 1.5 cm lower pole cyst on the right, laterally, containing a small amount of mildly thickened internal septation without gross evidence of enhancement. Minimal low-density bladder wall thickening there is also medium density urine or a soft tissue mass filling the bladder, measuring 39 Hounsfield units on image number 93 of series 2. This measures 6.3 x 5.6 cm on the same image and 4.2 cm in length on sagittal image number 70. Stomach/Bowel: Unremarkable stomach, small bowel and colon. No evidence of appendicitis. Vascular/Lymphatic: Minimal  atheromatous iliac artery calcifications. No aneurysm or enlarged lymph nodes. Reproductive: Mildly enlarged prostate gland. Other: Small umbilical hernia containing fat. Musculoskeletal: Lumbar and lower thoracic spine degenerative changes. IMPRESSION: 1. Soft tissue mass or medium density fluid filling the urinary bladder. These possibilities could be resolved with urinary tract ultrasound. 2. 1.5 cm mildly complex lower pole right renal cyst. Further evaluation with pre and post contrast MRI should be considered. Pre and post contrast CT could alternatively be performed, but would likely be of decreased accuracy given lesion size. 3. 1.9 cm indeterminate left adrenal mass. This could represent an adenoma, metastasis or small primary adrenal neoplasm. This could also be assessed at time of MRI of the kidneys. 4. Previously demonstrated large anterior mediastinal mass. 5.  Calcific coronary artery atherosclerosis. 6. Small pericardial effusion. Electronically Signed   By: Claudie Revering M.D.   On: 01/16/2018 12:43   US Renal  Result Date: 01/25/2018 CLINICAL DATA:  RIGHT renal cyst, question bladder mass on CT EXAM: RENAL / URINARY TRACT ULTRASOUND COMPLETE COMPARISON:  CT abdomen and pelvis 01/16/2018 FINDINGS: Right Kidney: Length: 12.5 cm. Minimal cortical thinning. Increased cortical echogenicity. Small cyst at inferior pole 1.6 x 1.2 x 1.7 cm. No additional mass, hydronephrosis or shadowing calcification. Left Kidney: Length: 13.3 cm. Cortical thinning. Increased cortical echogenicity. Small cyst at inferior pole 2.0 x 1.2 x 1.9 cm. No additional mass, hydronephrosis or shadowing calcification. Bladder: Appears normal for degree of bladder distention. BILATERAL ureteral jets visualized. IMPRESSION: Small BILATERAL renal cysts. Probable medical renal disease changes of both kidneys. No evidence of bladder mass. Finding on prior CT exam likely represented excretion of residual contrast material from a CTA chest  exam performed 1 day prior to the CT abdomen exam. Electronically Signed   By: Lavonia Dana M.D.   On: 01/25/2018 17:09   Nm Pet Image Initial (pi) Skull Base To Thigh  Result Date: 01/26/2018 CLINICAL DATA:  Initial treatment strategy for anterior mediastinal mass. CT-guided biopsy on 01/19/2018 reportedly demonstrated necrotic tissue. EXAM: NUCLEAR MEDICINE PET SKULL BASE TO THIGH TECHNIQUE: 13.8 mCi F-18 FDG was injected intravenously. Full-ring PET imaging was performed from the skull base to thigh after the radiotracer. CT data was obtained and used for attenuation correction and anatomic localization. Fasting blood glucose: 99 mg/dl COMPARISON:  01/15/2018 chest CT.  01/16/2018 CT abdomen/pelvis. FINDINGS: Mediastinal blood pool activity: SUV max 3.8 NECK: No hypermetabolic lymph nodes in the neck. Hypermetabolism in the bilateral palatine tonsils, without discrete mass on CT images, nonspecific, probably physiologic/reactive. Incidental CT findings: none CHEST: Intensely hypermetabolic and partially necrotic 11.1 x 6.9 cm right anterior mediastinal mass with max  SUV 14.3 (series series 4/image 74). Otherwise no hypermetabolic axillary, mediastinal or hilar nodes. No hypermetabolic pulmonary findings. Incidental CT findings: Top-normal heart size. Coronary atherosclerosis. Minimally atherosclerotic thoracic aorta with stable ectatic 4.1 cm ascending thoracic aorta. No acute consolidative airspace disease, lung masses or significant pulmonary nodules. ABDOMEN/PELVIS: Heterogeneous mild hypermetabolism within the prostate with max SUV 4.8, without discrete mass on the CT images. Prostate is normal size. Left adrenal 1.9 cm nodule is non hypermetabolic and demonstrates a density of 6 HU, compatible with a benign adenoma. No abnormal hypermetabolic activity within the liver, pancreas, adrenal glands, or spleen. No hypermetabolic lymph nodes in the abdomen or pelvis. Incidental CT findings: none SKELETON: No  focal osseous hypermetabolic activity. There is diffuse marrow hypermetabolism throughout the axial and proximal appendicular skeleton. Representative max SUV 5.6 in the L2 vertebral body. Incidental CT findings: none IMPRESSION: 1. Intensely hypermetabolic and partially necrotic 11.1 cm right anterior mediastinal mass, compatible with malignancy, with the leading differential considerations including lymphoma or thymic malignancy. The most uniformly hypermetabolic portion of the mass for the purposes of tissue sampling is located at the superior margin. 2. Diffuse marrow hypermetabolism, nonspecific, cannot exclude an infiltrative neoplastic marrow process. No focal skeletal hypermetabolism. No focal bone lesions on the CT images. 3. Nonspecific mild heterogeneous prostatic hypermetabolism. Consider PSA correlation. 4. Left adrenal adenoma. 5.  Aortic Atherosclerosis (ICD10-I70.0). 6. Ectatic 4.1 cm ascending thoracic aorta. Recommend annual imaging followup by CTA or MRA. This recommendation follows 2010 ACCF/AHA/AATS/ACR/ASA/SCA/SCAI/SIR/STS/SVM Guidelines for the Diagnosis and Management of Patients with Thoracic Aortic Disease. Circulation. 2010; 121: F621-H086. Electronically Signed   By: Ilona Sorrel M.D.   On: 01/26/2018 18:10   Ct Biopsy  Result Date: 01/19/2018 INDICATION: 52 year old male with a history of anterior mediastinal mass EXAM: CT-GUIDED BIOPSY MEDIASTINAL MASS MEDICATIONS: None. ANESTHESIA/SEDATION: Moderate (conscious) sedation was employed during this procedure. A total of Versed 2.0 mg and Fentanyl 100 mcg was administered intravenously. Moderate Sedation Time: 12 minutes. The patient's level of consciousness and vital signs were monitored continuously by radiology nursing throughout the procedure under my direct supervision. FLUOROSCOPY TIME:  None COMPLICATIONS: None PROCEDURE: The procedure, risks, benefits, and alternatives were explained to the patient and the patient's family.  Specific risks that were addressed included bleeding, infection, pneumothorax, need for further procedure including chest tube placement, chance of delayed pneumothorax or hemorrhage, hemoptysis, nondiagnostic sample, cardiopulmonary collapse, death. Questions regarding the procedure were encouraged and answered. The patient understands and consents to the procedure. Patient was positioned in the supine position on the CT gantry table and a scout CT of the chest was performed for planning purposes. Once angle of approach was determined, the skin and subcutaneous tissues this scan was prepped and draped in the usual sterile fashion, and a sterile drape was applied covering the operative field. A sterile gown and sterile gloves were used for the procedure. Local anesthesia was provided with 1% Lidocaine. The skin and subcutaneous tissues were infiltrated 1% lidocaine for local anesthesia. Using CT guidance, a 17 gauge trocar needle was advanced into the anterior mediastinaltarget. After confirmation of the tip, separate 18 gauge core biopsies were performed. These were placed into saline solution for transportation to the lab. A final CT image was performed. Patient tolerated the procedure well and remained hemodynamically stable throughout. No complications were encountered and no significant blood loss was encounter IMPRESSION: Status post CT-guided biopsy of anterior mediastinal mass. Tissue specimen sent to pathology for complete histopathologic analysis. Signed, Dulcy Fanny. Earleen Newport, DO,  [facility] Radiology Electronically Signed   By: Corrie Mckusick D.O.   On: 01/19/2018 10:59   Dg Chest Port 1 View  Result Date: 01/19/2018 CLINICAL DATA:  52 year old male with a history of anterior mediastinal mass, status post biopsy EXAM: PORTABLE CHEST 1 VIEW COMPARISON:  CT 01/19/2018, 01/16/2018, chest x-ray 01/15/2018 FINDINGS: The cardiac and mediastinal contour  unchanged with prominence of the right heart border representing the known mediastinal mass. No evidence of pneumothorax. No pleural effusion. No confluent airspace disease. No acute displaced fracture. IMPRESSION: No complications status post mediastinal mass biopsy. No evidence of acute cardiopulmonary disease. Contour of the mediastinum is unchanged in this patient with known mediastinal mass. Electronically Signed   By: Corrie Mckusick D.O.   On: 01/19/2018 11:35   Ct Angio Chest Aorta W And/or Wo Contrast  Result Date: 01/15/2018 CLINICAL DATA:  Chest pain. EXAM: CT ANGIOGRAPHY CHEST WITH CONTRAST TECHNIQUE: Multidetector CT imaging of the chest was performed using the standard protocol during bolus administration of intravenous contrast. Multiplanar CT image reconstructions and MIPs were obtained to evaluate the vascular anatomy. CONTRAST:  178m ISOVUE-370 IOPAMIDOL (ISOVUE-370) INJECTION 76% COMPARISON:  Radiograph of same day. FINDINGS: Cardiovascular: Preferential opacification of the thoracic aorta. No evidence of thoracic aortic aneurysm or dissection. Normal heart size. No pericardial effusion. Mediastinum/Nodes: 11.3 x 8.0 cm soft tissue mass is noted in the anterior mediastinum directly abutting the anterior portion of the ascending thoracic aorta. This is highly concerning for neoplasm or malignancy, such as lymphoma, germ-cell tumor or possibly metastatic disease. Thyroid gland and esophagus are unremarkable. No other significant adenopathy is noted. Lungs/Pleura: Lungs are clear. No pleural effusion or pneumothorax. Upper Abdomen: No acute abnormality. Musculoskeletal: No chest wall abnormality. No acute or significant osseous findings. Review of the MIP images confirms the above findings. IMPRESSION: 11.3 x 8.0 cm anterior mediastinal mass is noted concerning for neoplasm or malignancy, such as lymphoma, germ-cell tumor or possibly metastatic disease. Tissue sampling is recommended.  Electronically Signed   By: JMarijo Conception M.D.   On: 01/15/2018 07:37    Labs:  CBC: Recent Labs    01/15/18 0632 01/19/18 0840 02/03/18 0801  WBC 13.0* 10.4 8.1  HGB 14.9 15.1 14.8  HCT 42.2 45.3 42.5  PLT 336 426 242    COAGS: Recent Labs    01/16/18 0346 01/19/18 0840 02/03/18 0801  INR 1.12 1.01 0.97  APTT  --  33  --     BMP: Recent Labs    11/27/17 1459 01/15/18 0632 01/16/18 0346  NA  --  137 135  K  --  3.3* 3.1*  CL  --  104 99  CO2  --  22 25  GLUCOSE  --  121* 133*  BUN  --  22* 20  CALCIUM  --  9.1 8.9  CREATININE 1.00 1.00 0.95  GFRNONAA  --  >60 >60  GFRAA  --  >60 >60    LIVER FUNCTION TESTS: No results for input(s): BILITOT, AST, ALT, ALKPHOS, PROT, ALBUMIN in the last 8760 hours.  TUMOR MARKERS: No results for input(s): AFPTM, CEA, CA199, CHROMGRNA in the last 8760 hours.  Assessment and Plan:  Ronnie LYNKis a 52y.o. male with past medical history significant for hypertension who was found to have an anterior mediastinal mass on CT scan obtained for the work-up of chest pain and dysphasia.  Patient underwent CT-guided biopsy of the anterior mediastinal mass by Dr.  Wagner on 01/19/2018 however the biopsy only yielded necrotic tissue.  Patient subsequent underwent PET scan on 01/26/2018 which demonstrated diffuse hypermetabolic activity throughout the anterior mediastinal mass as well as throughout the imaged osseous structures.  As such, patient presented today for CT-guided bone marrow biopsy and aspiration for tissue diagnostic purposes.  Risks and benefits of CT guided BM Bx was discussed with the patient including, but not limited to bleeding, infection, damage to adjacent structures or low yield requiring additional tests.  All of the patient's questions were answered, patient is agreeable to proceed.  Consent signed and in chart.  Thank you for this interesting consult.  I greatly enjoyed meeting Ronnie Mcintyre and look  forward to participating in their care.  A copy of this report was sent to the requesting provider on this date.  Electronically Signed: Sandi Mariscal, MD 02/03/2018, 9:03 AM   I spent a total of 15 Minutes in face to face in clinical consultation, greater than 50% of which was counseling/coordinating care for CT guided BM Bx.

## 2018-02-06 ENCOUNTER — Encounter: Payer: Self-pay | Admitting: Urgent Care

## 2018-02-06 ENCOUNTER — Telehealth: Payer: Self-pay | Admitting: Urgent Care

## 2018-02-06 DIAGNOSIS — Z452 Encounter for adjustment and management of vascular access device: Secondary | ICD-10-CM

## 2018-02-06 DIAGNOSIS — J9859 Other diseases of mediastinum, not elsewhere classified: Secondary | ICD-10-CM

## 2018-02-06 NOTE — Telephone Encounter (Signed)
Re: Results and plan of care  Call placed to patient to review results that we have back thus far. Pathology report was released to patient online prior to discussion in clinic, so I wanted to speak with the patient personally to answer any questions that he may have.   Pathology suggests large B-cell lymphoma. Bone marrow biopsy suggesting no marrow involvement (hypercellular marrow with trilineage hematopoesis; no evidence of a lymphoproliferative process). Flow cytometry was (-), demonstrating no evidence of a monoclonal B-cell population. Cytogenetics are pending.   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 Questions fielded. I added that he would required treatment. Patient needs to meet with Dr. Mike Gip to further discuss all of this, however I made him aware that she was going to want to proceed with treatment as soon as possible. In order to treat him, however, there are several things that have to occur. Plan of care going forward reviewed with patient as follows:  1. Schedule for formal chemotherapy education class.  2. Schedule for pre-treatment echocardiogram. Patient notes that he has had one recently. CHL reviewed. Patient did indeed have an echo on 01/15/2018 that demonstrated normal cardiac function. LV EF 55-60%.  3. Refer to vascular surgery for vascular port access. Aware that [facility] may call him before he sees Dr. Mike Gip in clinic. I explained the need for port access, and patient is fine with proceeding.   4. Asked patient to obtain FMLA and/or STD paperwork from his job. Explained that while everyone is different, he is going to require time off from work. Patient asking about a timeframe, which unfortunately, I could not assume him of. I explained that it will be based on his response and how well he tolerates treatment.   I apologized to patient for having to share all of this information over the phone,  however with him having access to the results prior to review in clinic, I felt as if this was the best way to proceed in order to prevent prolonged anticipation and worry. Patient appreciative of call and for the continuing efforts to work with him through his journey of diagnosis and now treatment. Patient thanks me for my attempts to keep him informed.   Honor Loh, MSN, APRN, FNP-C, CEN Oncology/Hematology Nurse Practitioner  [facility] 02/06/18, 3:41 PM

## 2018-02-08 ENCOUNTER — Telehealth: Payer: Self-pay | Admitting: *Deleted

## 2018-02-08 NOTE — Patient Instructions (Signed)
Rituximab injection What is this medicine? RITUXIMAB (ri TUX i mab) is a monoclonal antibody. It is used to treat certain types of cancer like non-Hodgkin lymphoma and chronic lymphocytic leukemia. It is also used to treat rheumatoid arthritis, granulomatosis with polyangiitis (or Wegener's granulomatosis), and microscopic polyangiitis. This medicine may be used for other purposes; ask your health care provider or pharmacist if you have questions. COMMON BRAND NAME(S): Rituxan What should I tell my health care provider before I take this medicine? They need to know if you have any of these conditions: -heart disease -infection (especially a virus infection such as hepatitis B, chickenpox, cold sores, or herpes) -immune system problems -irregular heartbeat -kidney disease -lung or breathing disease, like asthma -recently received or scheduled to receive a vaccine -an unusual or allergic reaction to rituximab, mouse proteins, other medicines, foods, dyes, or preservatives -pregnant or trying to get pregnant -breast-feeding How should I use this medicine? This medicine is for infusion into a vein. It is administered in a hospital or clinic by a specially trained health care professional. A special MedGuide will be given to you by the pharmacist with each prescription and refill. Be sure to read this information carefully each time. Talk to your pediatrician regarding the use of this medicine in children. This medicine is not approved for use in children. Overdosage: If you think you have taken too much of this medicine contact a poison control center or emergency room at once. NOTE: This medicine is only for you. Do not share this medicine with others. What if I miss a dose? It is important not to miss a dose. Call your doctor or health care professional if you are unable to keep an appointment. What may interact with this medicine? -cisplatin -other medicines for arthritis like disease  modifying antirheumatic drugs or tumor necrosis factor inhibitors -live virus vaccines This list may not describe all possible interactions. Give your health care provider a list of all the medicines, herbs, non-prescription drugs, or dietary supplements you use. Also tell them if you smoke, drink alcohol, or use illegal drugs. Some items may interact with your medicine. What should I watch for while using this medicine? Your condition will be monitored carefully while you are receiving this medicine. You may need blood work done while you are taking this medicine. This medicine can cause serious allergic reactions. To reduce your risk you may need to take medicine before treatment with this medicine. Take your medicine as directed. In some patients, this medicine may cause a serious brain infection that may cause death. If you have any problems seeing, thinking, speaking, walking, or standing, tell your doctor right away. If you cannot reach your doctor, urgently seek other source of medical care. Call your doctor or health care professional for advice if you get a fever, chills or sore throat, or other symptoms of a cold or flu. Do not treat yourself. This drug decreases your body's ability to fight infections. Try to avoid being around people who are sick. Do not become pregnant while taking this medicine or for 12 months after stopping it. Women should inform their doctor if they wish to become pregnant or think they might be pregnant. There is a potential for serious side effects to an unborn child. Talk to your health care professional or pharmacist for more information. What side effects may I notice from receiving this medicine? Side effects that you should report to your doctor or health care professional as soon as possible: -breathing   problems -chest pain -dizziness or feeling faint -fast, irregular heartbeat -low blood counts - this medicine may decrease the number of white blood cells,  red blood cells and platelets. You may be at increased risk for infections and bleeding. -mouth sores -redness, blistering, peeling or loosening of the skin, including inside the mouth (this can be added for any serious or exfoliative rash that could lead to hospitalization) -signs of infection - fever or chills, cough, sore throat, pain or difficulty passing urine -signs and symptoms of kidney injury like trouble passing urine or change in the amount of urine -signs and symptoms of liver injury like dark yellow or brown urine; general ill feeling or flu-like symptoms; light-colored stools; loss of appetite; nausea; right upper belly pain; unusually weak or tired; yellowing of the eyes or skin -stomach pain -vomiting Side effects that usually do not require medical attention (report to your doctor or health care professional if they continue or are bothersome): -headache -joint pain -muscle cramps or muscle pain This list may not describe all possible side effects. Call your doctor for medical advice about side effects. You may report side effects to FDA at 1-800-FDA-1088. Where should I keep my medicine? This drug is given in a hospital or clinic and will not be stored at home. NOTE: This sheet is a summary. It may not cover all possible information. If you have questions about this medicine, talk to your doctor, pharmacist, or health care provider.  2018 Elsevier/Gold Standard (2016-02-13 15:28:09) Doxorubicin injection What is this medicine? DOXORUBICIN (dox oh ROO bi sin) is a chemotherapy drug. It is used to treat many kinds of cancer like leukemia, lymphoma, neuroblastoma, sarcoma, and Wilms' tumor. It is also used to treat bladder cancer, breast cancer, lung cancer, ovarian cancer, stomach cancer, and thyroid cancer. This medicine may be used for other purposes; ask your health care provider or pharmacist if you have questions. COMMON BRAND NAME(S): Adriamycin, Adriamycin PFS, Adriamycin  RDF, Rubex What should I tell my health care provider before I take this medicine? They need to know if you have any of these conditions: -heart disease -history of low blood counts caused by a medicine -liver disease -recent or ongoing radiation therapy -an unusual or allergic reaction to doxorubicin, other chemotherapy agents, other medicines, foods, dyes, or preservatives -pregnant or trying to get pregnant -breast-feeding How should I use this medicine? This drug is given as an infusion into a vein. It is administered in a hospital or clinic by a specially trained health care professional. If you have pain, swelling, burning or any unusual feeling around the site of your injection, tell your health care professional right away. Talk to your pediatrician regarding the use of this medicine in children. Special care may be needed. Overdosage: If you think you have taken too much of this medicine contact a poison control center or emergency room at once. NOTE: This medicine is only for you. Do not share this medicine with others. What if I miss a dose? It is important not to miss your dose. Call your doctor or health care professional if you are unable to keep an appointment. What may interact with this medicine? This medicine may interact with the following medications: -6-mercaptopurine -paclitaxel -phenytoin -St. John's Wort -trastuzumab -verapamil This list may not describe all possible interactions. Give your health care provider a list of all the medicines, herbs, non-prescription drugs, or dietary supplements you use. Also tell them if you smoke, drink alcohol, or use illegal drugs.   Some items may interact with your medicine. What should I watch for while using this medicine? This drug may make you feel generally unwell. This is not uncommon, as chemotherapy can affect healthy cells as well as cancer cells. Report any side effects. Continue your course of treatment even though you  feel ill unless your doctor tells you to stop. There is a maximum amount of this medicine you should receive throughout your life. The amount depends on the medical condition being treated and your overall health. Your doctor will watch how much of this medicine you receive in your lifetime. Tell your doctor if you have taken this medicine before. You may need blood work done while you are taking this medicine. Your urine may turn red for a few days after your dose. This is not blood. If your urine is dark or brown, call your doctor. In some cases, you may be given additional medicines to help with side effects. Follow all directions for their use. Call your doctor or health care professional for advice if you get a fever, chills or sore throat, or other symptoms of a cold or flu. Do not treat yourself. This drug decreases your body's ability to fight infections. Try to avoid being around people who are sick. This medicine may increase your risk to bruise or bleed. Call your doctor or health care professional if you notice any unusual bleeding. Talk to your doctor about your risk of cancer. You may be more at risk for certain types of cancers if you take this medicine. Do not become pregnant while taking this medicine or for 6 months after stopping it. Women should inform their doctor if they wish to become pregnant or think they might be pregnant. Men should not father a child while taking this medicine and for 6 months after stopping it. There is a potential for serious side effects to an unborn child. Talk to your health care professional or pharmacist for more information. Do not breast-feed an infant while taking this medicine. This medicine has caused ovarian failure in some women and reduced sperm counts in some men This medicine may interfere with the ability to have a child. Talk with your doctor or health care professional if you are concerned about your fertility. What side effects may I notice  from receiving this medicine? Side effects that you should report to your doctor or health care professional as soon as possible: -allergic reactions like skin rash, itching or hives, swelling of the face, lips, or tongue -breathing problems -chest pain -fast or irregular heartbeat -low blood counts - this medicine may decrease the number of white blood cells, red blood cells and platelets. You may be at increased risk for infections and bleeding. -pain, redness, or irritation at site where injected -signs of infection - fever or chills, cough, sore throat, pain or difficulty passing urine -signs of decreased platelets or bleeding - bruising, pinpoint red spots on the skin, black, tarry stools, blood in the urine -swelling of the ankles, feet, hands -tiredness -weakness Side effects that usually do not require medical attention (report to your doctor or health care professional if they continue or are bothersome): -diarrhea -hair loss -mouth sores -nail discoloration or damage -nausea -red colored urine -vomiting This list may not describe all possible side effects. Call your doctor for medical advice about side effects. You may report side effects to FDA at 1-800-FDA-1088. Where should I keep my medicine? This drug is given in a hospital or clinic   and will not be stored at home. NOTE: This sheet is a summary. It may not cover all possible information. If you have questions about this medicine, talk to your doctor, pharmacist, or health care provider.  2018 Elsevier/Gold Standard (2015-09-03 11:28:51) Vincristine injection What is this medicine? VINCRISTINE (vin KRIS teen) is a chemotherapy drug. It slows the growth of cancer cells. This medicine is used to treat many types of cancer like Hodgkin's disease, leukemia, non-Hodgkin's lymphoma, neuroblastoma (brain cancer), rhabdomyosarcoma, and Wilms' tumor. This medicine may be used for other purposes; ask your health care provider or  pharmacist if you have questions. COMMON BRAND NAME(S): Oncovin, Vincasar PFS What should I tell my health care provider before I take this medicine? They need to know if you have any of these conditions: -blood disorders -gout -infection (especially chickenpox, cold sores, or herpes) -kidney disease -liver disease -lung disease -nervous system disease like Charcot-Marie-Tooth (CMT) -recent or ongoing radiation therapy -an unusual or allergic reaction to vincristine, other chemotherapy agents, other medicines, foods, dyes, or preservatives -pregnant or trying to get pregnant -breast-feeding How should I use this medicine? This drug is given as an infusion into a vein. It is administered in a hospital or clinic by a specially trained health care professional. If you have pain, swelling, burning, or any unusual feeling around the site of your injection, tell your health care professional right away. Talk to your pediatrician regarding the use of this medicine in children. While this drug may be prescribed for selected conditions, precautions do apply. Overdosage: If you think you have taken too much of this medicine contact a poison control center or emergency room at once. NOTE: This medicine is only for you. Do not share this medicine with others. What if I miss a dose? It is important not to miss your dose. Call your doctor or health care professional if you are unable to keep an appointment. What may interact with this medicine? Do not take this medicine with any of the following medications: -itraconazole -mibefradil -voriconazole This medicine may also interact with the following medications: -cyclosporine -erythromycin -fluconazole -ketoconazole -medicines for HIV like delavirdine, efavirenz, nevirapine -medicines for seizures like ethotoin, fosphenotoin, phenytoin -medicines to increase blood counts like filgrastim, pegfilgrastim, sargramostim -other chemotherapy drugs like  cisplatin, L-asparaginase, methotrexate, mitomycin, paclitaxel -pegaspargase -vaccines -zalcitabine, ddC Talk to your doctor or health care professional before taking any of these medicines: -acetaminophen -aspirin -ibuprofen -ketoprofen -naproxen This list may not describe all possible interactions. Give your health care provider a list of all the medicines, herbs, non-prescription drugs, or dietary supplements you use. Also tell them if you smoke, drink alcohol, or use illegal drugs. Some items may interact with your medicine. What should I watch for while using this medicine? Your condition will be monitored carefully while you are receiving this medicine. You will need important blood work done while you are taking this medicine. This drug may make you feel generally unwell. This is not uncommon, as chemotherapy can affect healthy cells as well as cancer cells. Report any side effects. Continue your course of treatment even though you feel ill unless your doctor tells you to stop. In some cases, you may be given additional medicines to help with side effects. Follow all directions for their use. Call your doctor or health care professional for advice if you get a fever, chills or sore throat, or other symptoms of a cold or flu. Do not treat yourself. Avoid taking products that contain aspirin, acetaminophen, ibuprofen,   naproxen, or ketoprofen unless instructed by your doctor. These medicines may hide a fever. Do not become pregnant while taking this medicine. Women should inform their doctor if they wish to become pregnant or think they might be pregnant. There is a potential for serious side effects to an unborn child. Talk to your health care professional or pharmacist for more information. Do not breast-feed an infant while taking this medicine. Men may have a lower sperm count while taking this medicine. Talk to your doctor if you plan to father a child. What side effects may I notice from  receiving this medicine? Side effects that you should report to your doctor or health care professional as soon as possible: -allergic reactions like skin rash, itching or hives, swelling of the face, lips, or tongue -breathing problems -confusion or changes in emotions or moods -constipation -cough -mouth sores -muscle weakness -nausea and vomiting -pain, swelling, redness or irritation at the injection site -pain, tingling, numbness in the hands or feet -problems with balance, talking, walking -seizures -stomach pain -trouble passing urine or change in the amount of urine Side effects that usually do not require medical attention (report to your doctor or health care professional if they continue or are bothersome): -diarrhea -hair loss -jaw pain -loss of appetite This list may not describe all possible side effects. Call your doctor for medical advice about side effects. You may report side effects to FDA at 1-800-FDA-1088. Where should I keep my medicine? This drug is given in a hospital or clinic and will not be stored at home. NOTE: This sheet is a summary. It may not cover all possible information. If you have questions about this medicine, talk to your doctor, pharmacist, or health care provider.  2018 Elsevier/Gold Standard (2008-04-03 17:17:13) Cyclophosphamide injection What is this medicine? CYCLOPHOSPHAMIDE (sye kloe FOSS fa mide) is a chemotherapy drug. It slows the growth of cancer cells. This medicine is used to treat many types of cancer like lymphoma, myeloma, leukemia, breast cancer, and ovarian cancer, to name a few. This medicine may be used for other purposes; ask your health care provider or pharmacist if you have questions. COMMON BRAND NAME(S): Cytoxan, Neosar What should I tell my health care provider before I take this medicine? They need to know if you have any of these conditions: -blood disorders -history of other chemotherapy -infection -kidney  disease -liver disease -recent or ongoing radiation therapy -tumors in the bone marrow -an unusual or allergic reaction to cyclophosphamide, other chemotherapy, other medicines, foods, dyes, or preservatives -pregnant or trying to get pregnant -breast-feeding How should I use this medicine? This drug is usually given as an injection into a vein or muscle or by infusion into a vein. It is administered in a hospital or clinic by a specially trained health care professional. Talk to your pediatrician regarding the use of this medicine in children. Special care may be needed. Overdosage: If you think you have taken too much of this medicine contact a poison control center or emergency room at once. NOTE: This medicine is only for you. Do not share this medicine with others. What if I miss a dose? It is important not to miss your dose. Call your doctor or health care professional if you are unable to keep an appointment. What may interact with this medicine? This medicine may interact with the following medications: -amiodarone -amphotericin B -azathioprine -certain antiviral medicines for HIV or AIDS such as protease inhibitors (e.g., indinavir, ritonavir) and zidovudine -certain blood   pressure medications such as benazepril, captopril, enalapril, fosinopril, lisinopril, moexipril, monopril, perindopril, quinapril, ramipril, trandolapril -certain cancer medications such as anthracyclines (e.g., daunorubicin, doxorubicin), busulfan, cytarabine, paclitaxel, pentostatin, tamoxifen, trastuzumab -certain diuretics such as chlorothiazide, chlorthalidone, hydrochlorothiazide, indapamide, metolazone -certain medicines that treat or prevent blood clots like warfarin -certain muscle relaxants such as succinylcholine -cyclosporine -etanercept -indomethacin -medicines to increase blood counts like filgrastim, pegfilgrastim, sargramostim -medicines used as general  anesthesia -metronidazole -natalizumab This list may not describe all possible interactions. Give your health care provider a list of all the medicines, herbs, non-prescription drugs, or dietary supplements you use. Also tell them if you smoke, drink alcohol, or use illegal drugs. Some items may interact with your medicine. What should I watch for while using this medicine? Visit your doctor for checks on your progress. This drug may make you feel generally unwell. This is not uncommon, as chemotherapy can affect healthy cells as well as cancer cells. Report any side effects. Continue your course of treatment even though you feel ill unless your doctor tells you to stop. Drink water or other fluids as directed. Urinate often, even at night. In some cases, you may be given additional medicines to help with side effects. Follow all directions for their use. Call your doctor or health care professional for advice if you get a fever, chills or sore throat, or other symptoms of a cold or flu. Do not treat yourself. This drug decreases your body's ability to fight infections. Try to avoid being around people who are sick. This medicine may increase your risk to bruise or bleed. Call your doctor or health care professional if you notice any unusual bleeding. Be careful brushing and flossing your teeth or using a toothpick because you may get an infection or bleed more easily. If you have any dental work done, tell your dentist you are receiving this medicine. You may get drowsy or dizzy. Do not drive, use machinery, or do anything that needs mental alertness until you know how this medicine affects you. Do not become pregnant while taking this medicine or for 1 year after stopping it. Women should inform their doctor if they wish to become pregnant or think they might be pregnant. Men should not father a child while taking this medicine and for 4 months after stopping it. There is a potential for serious side  effects to an unborn child. Talk to your health care professional or pharmacist for more information. Do not breast-feed an infant while taking this medicine. This medicine may interfere with the ability to have a child. This medicine has caused ovarian failure in some women. This medicine has caused reduced sperm counts in some men. You should talk with your doctor or health care professional if you are concerned about your fertility. If you are going to have surgery, tell your doctor or health care professional that you have taken this medicine. What side effects may I notice from receiving this medicine? Side effects that you should report to your doctor or health care professional as soon as possible: -allergic reactions like skin rash, itching or hives, swelling of the face, lips, or tongue -low blood counts - this medicine may decrease the number of white blood cells, red blood cells and platelets. You may be at increased risk for infections and bleeding. -signs of infection - fever or chills, cough, sore throat, pain or difficulty passing urine -signs of decreased platelets or bleeding - bruising, pinpoint red spots on the skin, black, tarry stools, blood   in the urine -signs of decreased red blood cells - unusually weak or tired, fainting spells, lightheadedness -breathing problems -dark urine -dizziness -palpitations -swelling of the ankles, feet, hands -trouble passing urine or change in the amount of urine -weight gain -yellowing of the eyes or skin Side effects that usually do not require medical attention (report to your doctor or health care professional if they continue or are bothersome): -changes in nail or skin color -hair loss -missed menstrual periods -mouth sores -nausea, vomiting This list may not describe all possible side effects. Call your doctor for medical advice about side effects. You may report side effects to FDA at 1-800-FDA-1088. Where should I keep my  medicine? This drug is given in a hospital or clinic and will not be stored at home. NOTE: This sheet is a summary. It may not cover all possible information. If you have questions about this medicine, talk to your doctor, pharmacist, or health care provider.  2018 Elsevier/Gold Standard (2012-05-21 16:22:58)  

## 2018-02-08 NOTE — Telephone Encounter (Signed)
Called patient on 02/08/18 and made him aware of his scheduled  9:00 am. Chemo Edu Class in [place] and his 2:00 MD appt scheduled in [place] on 02/10/18. Appts. were scheduled as requested per [facility] 02/06/18 staff message.

## 2018-02-09 ENCOUNTER — Encounter: Payer: Self-pay | Admitting: Urgent Care

## 2018-02-09 ENCOUNTER — Other Ambulatory Visit (INDEPENDENT_AMBULATORY_CARE_PROVIDER_SITE_OTHER): Payer: Self-pay | Admitting: Vascular Surgery

## 2018-02-09 ENCOUNTER — Telehealth: Payer: Self-pay | Admitting: Surgery

## 2018-02-09 NOTE — Telephone Encounter (Signed)
Please advise patient that the port placement will be done at [facility] on 02/11/18 by Dr Dahlia Byes. Patient will need to arrive at the Medical Mall-Registration desk on the first floor at 6:45am the day of surgery and once he is registered, he will be guided to the second floor to Same Day Surgery. Please inform him of being NPO after midnight prior to surgery and to hold all medications the morning of surgery. Dr Dahlia Byes has reviewed all labs and medications. Also let the patient know that Dr Dahlia Byes will meet him face to face to do a history and physical prior to the patient going back for the procedure. If the patient has any questions, please have him call our office [facility] - [phone].

## 2018-02-09 NOTE — Progress Notes (Signed)
Re: Port placement  Patient in need of central venous access device placement for chemotherapy administration. Patient to meet with primary oncologist on 02/10/2018 to discuss plans for treatment, which I anticipate will begin on 02/12/2018 if his insurance approves his medications.  Patient to undergo central venous access device placement on 02/11/2018 with Dr. Caroleen Hamman. We will plan on making patient aware of details tomorrow at his clinic appointment.   Pertinent details: 1. NPO after midnight. Hold morning medications.  2. Patient needs to arrive to medical mall at [place] on 02/11/2018. 3. Dr. Dahlia Byes will meet with patient to explain procedure and perform H&P prior to proceeding.  4. Questions should be directed to [place] [facility] [place], MSN, APRN, FNP-C, CEN Oncology/Hematology Nurse Practitioner  [facility] 02/09/18, 4:11 PM

## 2018-02-10 ENCOUNTER — Telehealth: Payer: Self-pay | Admitting: Urgent Care

## 2018-02-10 ENCOUNTER — Inpatient Hospital Stay: Payer: Commercial Managed Care - PPO

## 2018-02-10 ENCOUNTER — Inpatient Hospital Stay (HOSPITAL_BASED_OUTPATIENT_CLINIC_OR_DEPARTMENT_OTHER): Payer: Commercial Managed Care - PPO | Admitting: Hematology and Oncology

## 2018-02-10 ENCOUNTER — Other Ambulatory Visit: Payer: Self-pay | Admitting: Hematology and Oncology

## 2018-02-10 ENCOUNTER — Encounter: Payer: Self-pay | Admitting: Urgent Care

## 2018-02-10 VITALS — BP 130/86 | HR 94 | Temp 98.7°F | Resp 18 | Wt 259.0 lb

## 2018-02-10 DIAGNOSIS — C8510 Unspecified B-cell lymphoma, unspecified site: Secondary | ICD-10-CM

## 2018-02-10 DIAGNOSIS — R948 Abnormal results of function studies of other organs and systems: Secondary | ICD-10-CM

## 2018-02-10 DIAGNOSIS — I10 Essential (primary) hypertension: Secondary | ICD-10-CM

## 2018-02-10 DIAGNOSIS — R0789 Other chest pain: Secondary | ICD-10-CM

## 2018-02-10 DIAGNOSIS — Z5111 Encounter for antineoplastic chemotherapy: Secondary | ICD-10-CM | POA: Diagnosis not present

## 2018-02-10 DIAGNOSIS — R93811 Abnormal radiologic findings on diagnostic imaging of right testicle: Secondary | ICD-10-CM | POA: Diagnosis not present

## 2018-02-10 LAB — PSA: Prostatic Specific Antigen: 0.48 ng/mL (ref 0.00–4.00)

## 2018-02-10 LAB — COMPREHENSIVE METABOLIC PANEL
ALT: 22 U/L (ref 0–44)
AST: 23 U/L (ref 15–41)
Albumin: 4.2 g/dL (ref 3.5–5.0)
Alkaline Phosphatase: 68 U/L (ref 38–126)
Anion gap: 15 (ref 5–15)
BUN: 17 mg/dL (ref 6–20)
CO2: 24 mmol/L (ref 22–32)
Calcium: 9.3 mg/dL (ref 8.9–10.3)
Chloride: 97 mmol/L — ABNORMAL LOW (ref 98–111)
Creatinine, Ser: 0.86 mg/dL (ref 0.61–1.24)
GFR calc Af Amer: >60 mL/min (ref >60)
GFR calc non Af Amer: >60 mL/min (ref >60)
Glucose, Bld: 97 mg/dL (ref 70–99)
Potassium: 2.9 mmol/L — ABNORMAL LOW (ref 3.5–5.1)
Sodium: 136 mmol/L (ref 135–145)
Total Bilirubin: 1.1 mg/dL (ref 0.3–1.2)
Total Protein: 7.9 g/dL (ref 6.5–8.1)

## 2018-02-10 LAB — URIC ACID: Uric Acid, Serum: 5 mg/dL (ref 3.7–8.6)

## 2018-02-10 LAB — BASIC METABOLIC PANEL
Anion gap: 14 (ref 5–15)
BUN: 17 mg/dL (ref 6–20)
CO2: 24 mmol/L (ref 22–32)
Calcium: 9.1 mg/dL (ref 8.9–10.3)
Chloride: 97 mmol/L — ABNORMAL LOW (ref 98–111)
Creatinine, Ser: 0.95 mg/dL (ref 0.61–1.24)
GFR calc Af Amer: >60 mL/min (ref >60)
GFR calc non Af Amer: >60 mL/min (ref >60)
Glucose, Bld: 100 mg/dL — ABNORMAL HIGH (ref 70–99)
Potassium: 2.9 mmol/L — ABNORMAL LOW (ref 3.5–5.1)
Sodium: 135 mmol/L (ref 135–145)

## 2018-02-10 LAB — LACTATE DEHYDROGENASE: LDH: 346 U/L — ABNORMAL HIGH (ref 98–192)

## 2018-02-10 LAB — MAGNESIUM: Magnesium: 1.9 mg/dL (ref 1.7–2.4)

## 2018-02-10 MED ORDER — ALLOPURINOL 300 MG PO TABS: 300.0000 mg | ORAL_TABLET | Freq: Every day | ORAL | 1 refills | Status: DC

## 2018-02-10 MED ORDER — POTASSIUM CHLORIDE CRYS ER 20 MEQ PO TBCR: EXTENDED_RELEASE_TABLET | ORAL | 0 refills | Status: DC

## 2018-02-10 MED ORDER — CEFAZOLIN SODIUM-DEXTROSE 2-4 GM/100ML-% IV SOLN
2.0000 g | INTRAVENOUS | Status: AC
  Administered 2018-02-11: 2 g via INTRAVENOUS

## 2018-02-10 NOTE — Progress Notes (Signed)
Patient states his appetite is good, however, he is having problems with food and liquids going down.  He is having pan today 5/10.  Also states sometimes when he bends down to tie his shoes when he comes back up he gets lightheaded.  Patient state he is having problems sleeping.  He was taking Tylenol and it helped him to get about 4 hours of sleep.  He has taken a 5 mg Oxycodone a couple of nights and he is still unable to get a full nights sleep.  He states he has to stack pillows so that he is elevated.  When he lies on his back he feels like something Is pushing on both sides of his chest and radiates into his neck and head.  Describes that pain like having a bad headache.  Patient also states he has aching in his right arm from his shoulder to his elbow when lying down.

## 2018-02-10 NOTE — Telephone Encounter (Signed)
-----   Message from Lequita Asal, MD sent at 02/10/2018  5:00 PM EDT ----- Regarding: Please call patient  Is he taking oral potassium?  I don't remember him talking about diarrhea.  However, her is on HCTZ.  If not, he should be on for a couple of days.  However, once chemo starts he will need to be off in case he has tumor lysis.  M  ----- Message ----- From: Interface, Lab In [place] Sent: 02/10/2018   4:38 PM To: Lequita Asal, MD

## 2018-02-10 NOTE — Progress Notes (Signed)
[facility] day:  02/10/2018   Chief Complaint: Ronnie Mcintyre is a 52 y.o. male with a large B-cell lymphoma who is seen for review of interval studies and discussion regarding direction of therapy.  HPI:  The patient was last seen in the medical oncology clinic on 01/22/2018 after recent hospitalization and CT guided biopsy. Symptomatically, he continued to experience chest fullness and head sweats.  He was able to lay flat for several hours at night.  Exam revealed a patchy macular non-confluent abdominal rash above waistline laterally.  He had no facial plethora.  We discussed the CT guided biopsy which revealed necrotic tissue. We discussed consideration of a repeat biopsy guided by PET imaging.  Sample was sent to [facility].  Flow cytometry was negative.  PET scan on 01/26/2018 revealed an 11.1 cm intensely hypermetabolic and partially necrotic right anterior mediastinal mass, compatible with malignancy. The most uniformly hypermetabolic portion of the mass for the purposes of tissue sampling is located at the superior margin.  There was diffuse marrow hypermetabolism, nonspecific, cannot exclude an infiltrative neoplastic marrow process. There was no focal skeletal hypermetabolism. There was no focal bone lesions on the CT images.  There was nonspecific mild heterogeneous prostatic hypermetabolism.  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 Recommend annual imaging followup by CTA or MRA.   [facility] consultation on the original CT guided anterior mediastinal mass biopsy revealed neoplastic cells are positive for CD20, BCL-6, MUM-1, and BCL-2. The neoplastic cells were negative for CD30, CD3, , CD10, c-MYC, EBV ISH, AE1/3, and PAX-8. Ki67 is 60-70%. 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 The absence of CD30 expression makes primary mediastinal  large B-cell lymphoma less likely. FISH for [place] rearrangement was being attempted on remaining tissue and the result will be reported in an addendum.   Bone marrow biopsy on 02/03/2018 revealed a slightly hypercellular bone marrow for age with trilineage hematopoiesis.  There was no monoclonal B-cell population or abnormal T-cell phenotype identified.  Cytogenetics are pending.  Renal ultrasound on 01/25/2018 revealed small BILATERAL renal cysts, probable medical renal disease changes of both kidneys, and no evidence of bladder mass.  Finding on prior CT exam likely represented excretion of residual contrast material from a CTA chest exam performed 1 day prior   Echo on 01/15/2018 revealed an EF of 55-60%.  Patient is scheduled for port-a-cath placement on 02/11/2018.  Patient attended chemotherapy education class with Magdalene Patricia, RN today. He was provided with written information regarding his proposed course of chemotherapy treatments. Patient was given the opportunity to have his preliminary clinical questions fielded by cancer center nurse navigator.   During the interim, patient notes increased chest pain over the course of the last week. Patient is unable to position himself supine due to an increase in his pain. He has been using APAP. Patient is not sleeping well. He is averaging 3-4 hours of sleep at night. Patient took Roxicodone 5 mg for the last 2 nights that "helped some".   Patient is no longer having head sweats at this time. He denies any recent infections.    Past Medical History:  Diagnosis Date  . Hypertension     Past Surgical History:  Procedure Laterality Date  . ANKLE SURGERY Right     Family History  Problem Relation Age of Onset  . Hypertension Mother   . Hypertension  Father   . Multiple myeloma Father     Social History:  reports that he has never smoked. He has never used smokeless tobacco. He reports that he drinks alcohol. He reports that he does not use  drugs.  He drinks a beer 1-2 x/week.  He works for [employer].  He lives in [place].  His is accompanied by his mother today.  Allergies: No Known Allergies  Current Medications: Current Outpatient Medications  Medication Sig Dispense Refill  . acetaminophen (TYLENOL) 325 MG tablet Take 2 tablets (650 mg total) by mouth every 6 (six) hours as needed for mild pain (or Fever >/= 101).    [name] aspirin 81 MG tablet Take 81 mg by mouth daily.     . felodipine (PLENDIL) 5 MG 24 hr tablet Take 5 mg by mouth daily.    . fluticasone (FLONASE) 50 MCG/ACT nasal spray Place 2 sprays into both nostrils daily. 16 g 0  . hydrocortisone 1 % lotion Apply 1 application topically 2 (two) times daily as needed for itching.    . loratadine (CLARITIN) 10 MG tablet Take 10 mg by mouth daily as needed for allergies.     [name] losartan-hydrochlorothiazide (HYZAAR) 100-25 MG tablet Take 1 tablet by mouth daily.    [name] oxyCODONE (OXY IR/ROXICODONE) 5 MG immediate release tablet Take 1 tablet (5 mg total) by mouth every 6 (six) hours as needed for moderate pain. 30 tablet 0  . simvastatin (ZOCOR) 40 MG tablet Take 40 mg by mouth daily.     No current facility-administered medications for this visit.     Review of Systems:  GENERAL:  Feels "ok".  No fevers.  Head sweats resolved.  Weight down 1 pound since last visit (16 pounds in 3 months). PERFORMANCE STATUS (ECOG):  1. HEENT:  No visual changes, runny nose, sore throat, mouth sores or tenderness. Lungs: No shortness of breath or cough.  No hemoptysis. Cardiac:  Chest discomfort and difficulty finding a comfortable position.  No palpitations, orthopnea, or PND. GI:  No nausea, vomiting, diarrhea, constipation, melena or hematochezia. GU:  No urgency, frequency, dysuria, or hematuria. Musculoskeletal:  No back pain.  No joint pain.  No muscle tenderness. Extremities:  No pain or swelling. Skin:  No rashes or skin changes. Neuro:  No headache, numbness or  weakness, balance or coordination issues. Endocrine:  No diabetes, thyroid issues, hot flashes or night sweats. Psych:  No mood changes, depression or anxiety.  Difficulty sleeping. Pain:  Chest discomfort/pain. Review of systems:  All other systems reviewed and found to be negative.   Physical Exam: Blood pressure 130/86, pulse 94, temperature 98.7 F (37.1 C), temperature source Tympanic, resp. rate 18, weight 259 lb 0.7 oz (117.5 kg). GENERAL:  Well developed, well nourished, gentleman sitting comfortably in the exam room in no acute distress. MENTAL STATUS:  Alert and oriented to person, place and time. HEAD: Short dark hair.  Normocephalic, atraumatic, face symmetric, no Cushingoid features.  No facial plethora. EYES:  Blue eyes.  Pupils equal round and reactive to light and accomodation.  No conjunctivitis or scleral icterus. ENT:  Oropharynx clear without lesion.  Tongue normal. Mucous membranes moist.  RESPIRATORY:  Clear to auscultation without rales, wheezes or rhonchi. CARDIOVASCULAR:  Regular rate and rhythm without murmur, rub or gallop. ABDOMEN:  Soft, non-tender, with active bowel sounds, and no hepatosplenomegaly.  No masses. SKIN:  No rashes, ulcers or lesions. EXTREMITIES: No edema, no skin discoloration or tenderness.  No  palpable cords. LYMPH NODES: No palpable cervical, supraclavicular, axillary or inguinal adenopathy  NEUROLOGICAL: Unremarkable. PSYCH:  Appropriate.    No visits with results within 3 Day(s) from this visit.  Latest known visit with results is:  Hospital Outpatient Visit on 02/03/2018  Component Date Value Ref Range Status  . Prothrombin Time 02/03/2018 12.8  11.4 - 15.2 seconds Final  . INR 02/03/2018 0.97   Final   Performed at [facility], [address] [place], [place] [address]  . WBC 02/03/2018 8.1  3.8 - 10.6 K/uL Final  . RBC 02/03/2018 5.27  4.40 - 5.90 MIL/uL Final  . Hemoglobin 02/03/2018 14.8  13.0 - 18.0 g/dL Final  .  HCT 02/03/2018 42.5  40.0 - 52.0 % Final  . MCV 02/03/2018 80.6  80.0 - 100.0 fL Final  . MCH 02/03/2018 28.1  26.0 - 34.0 pg Final  . MCHC 02/03/2018 34.8  32.0 - 36.0 g/dL Final  . RDW 02/03/2018 17.4* 11.5 - 14.5 % Final  . Platelets 02/03/2018 242  150 - 440 K/uL Final  . Neutrophils Relative % 02/03/2018 78  % Final  . Neutro Abs 02/03/2018 6.3  1.4 - 6.5 K/uL Final  . Lymphocytes Relative 02/03/2018 11  % Final  . Lymphs Abs 02/03/2018 0.9* 1.0 - 3.6 K/uL Final  . Monocytes Relative 02/03/2018 7  % Final  . Monocytes Absolute 02/03/2018 0.6  0.2 - 1.0 K/uL Final  . Eosinophils Relative 02/03/2018 3  % Final  . Eosinophils Absolute 02/03/2018 0.2  0 - 0.7 K/uL Final  . Basophils Relative 02/03/2018 1  % Final  . Basophils Absolute 02/03/2018 0.1  0 - 0.1 K/uL Final   Performed at [facility], [address], [place],  [address]    Assessment:  DACARI BECKSTRAND is a 52 y.o. 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  [facility] pathology revealed necrotic tissue. [facility] consultation revealed neoplastic cells are positive for CD20, BCL-6, MUM-1, and BCL-2. The neoplastic cells were negative for CD30, CD3, , CD10, c-MYC, EBV ISH, AE1/3, and PAX-8. Ki67 is 60-70%. 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 The absence of CD30 expression makes primary mediastinal large B-cell lymphoma less likely. FISH for [place] rearrangement is pending. IPI score, age adjusted IPI, are low (1) and NCCN IPI score is low-intermediate (2).  He presented with upper chest fullness with radiation to his neck and arm. He has had B symptoms (sweats and weight loss) worrisome for lymphoma. LDHis 273. Uric acid was normal.  Beta-HCG and AFP were normal on 01/16/2018.  Chest CT angiogramon 01/15/2018 revealed an 11.3 x 8.0 cm anterior mediastinal mass.  Abdomen and pelvic CT on 01/16/2018 revealed a 6.3  x 5.6 cm soft tissue mass or medium density fluid filling the urinary bladder.  There was a 1.5 cm mildly complex lower pole right renal cyst. There was a 1.9 cm indeterminate left adrenal mass.  There was a small pericardial effusion.  Renal ultrasound on 01/25/2018 revealed no evidence of bladder mass.  Finding on prior CT exam likely represented excretion of residual contrast material from a CTA chest exam performed 1 day prior   PET scan on 01/26/2018 revealed an 11.1 cm intensely hypermetabolic and partially necrotic right anterior mediastinal mass, compatible with malignancy. There was diffuse marrow hypermetabolism, nonspecific, cannot exclude an infiltrative neoplastic marrow process. There was no focal skeletal hypermetabolism. There  was no focal bone lesions on the CT images.  There was nonspecific mild heterogeneous prostatic hypermetabolism.  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    Bone marrow biopsy on 02/03/2018 revealed a slightly hypercellular bone marrow for age with trilineage hematopoiesis.  There was no monoclonal B-cell population or abnormal T-cell phenotype identified.  Cytogenetics are pending.  Echo on 01/15/2018 revealed an EF of 55-60%.    Symptomatically, he continues to experience chest discomfort and is unable to find a comfortable position.  He has no facial plethora.  Plan: 1. Labs today:  Hepatitis B core antibody total, hepatitis B surface antigen, hepatitis C antibody, HIV antibody, G6PD assay, BMP, uric acid, PSA. 2. Discuss [facility] consultation of pathology.  Discuss activated large B cell lymphoma.  Discuss need to r/o triple hit lymphoma.  Discuss awaiting c-myc prior to initiation of chemotherapy. 3. PET scan personally reviewed.  Discuss PET scan- intensively hypermetabolic mediastinal mass.  Discuss diffuse marrow hypermetabolism without focal skeletal hypermetabolism or focal bone lesions on CT.   Discussed with radiology.  Doubt marrow disease.  Discuss mild prostate hypermetabolism.  Check PSA. 4. Discuss potential for tumor lysis syndrome associated with treatment. Will start patient on allopurinol 300 mg today.  5. Discuss bulky stage Is B-cell lymphoma. Discuss potential treatment options based on final c-MYC testing from [facility]. IPI score was low to low intermediate. Treatments will be every 3 weeks x 6 cycles using either:  R-CHOP if c-MYC (-). Will be given as outpatient in infusion.   R-EPOCH if c-MYC (+) as BCL-6, BCL-2 are both positive and would be triple hit lymphoma.  Will require inpatient admission for R-EPOCH administration.  6. Discuss need for port placement. Patient has been scheduled to have port placed on 02/11/2018 with Dr. Dahlia Byes. Details provided regarding scheduled procedure. Patient verbalizes understanding and consent to proceed with central access device placement.  7. Discuss pain management. Patient using APAP and Roxicodone 5 mg at this point, which he notes is "helping some". Will continue therapy at this time. Discuss plan to begin steroids while awaiting c-MYC status as patient symptomatic.  Discuss need to monitor tumor lysis labs.  Discuss good hydration and allopurinol. 8. RTC for MD assessment, labs (CBC with diff, CMP, Mg, uric acid, LDH), and initiation of chemotherapy (RCHOP versus R-EPOCH).  A total of (30) minutes of face-to-face time was spent with the patient with greater than 50% of that time in counseling and care-coordination. Discussions were held with Dr Thayer Ohm, pathologist at [facility], and Dr Gerald Stabs Dittus at [facility] regarding pending studies.   Addendum:  C-MYC was reported as negative on 02/12/2018.  He will be scheduled for RCHOP chemotherapy.  He will be started on prednisone 100 mg a day.  Tumor lysis will be followed.  Preauthorization for chemotherapy is pending.   Honor Loh, NP  02/10/2018, 3:15 PM  I saw and evaluated the patient,  participating in the key portions of the service and reviewing pertinent diagnostic studies and records.  I reviewed the nurse practitioner's note and agree with the findings and the plan.  The assessment and plan were discussed with the patient.  Multiple questions were asked by the patient and answered.   Nolon Stalls, MD 02/10/2018,3:15 PM

## 2018-02-10 NOTE — Telephone Encounter (Signed)
K+ low at 2.9. Patient does not take K+ supplements. He is on thiazide diuretic therapy. Rx for KCl 20 mEq daily x 3 days sent to patient's pharmacy. I sent Rx for #6 in the event that patient needs additional supplementation with next lab draw.   Honor Loh, MSN, APRN, FNP-C, CEN Oncology/Hematology Nurse Practitioner  [facility] 02/10/18, 5:34 PM

## 2018-02-11 ENCOUNTER — Ambulatory Visit: Payer: Commercial Managed Care - PPO | Admitting: Anesthesiology

## 2018-02-11 ENCOUNTER — Telehealth: Payer: Self-pay

## 2018-02-11 ENCOUNTER — Other Ambulatory Visit: Payer: Self-pay

## 2018-02-11 ENCOUNTER — Encounter: Payer: Self-pay | Admitting: *Deleted

## 2018-02-11 ENCOUNTER — Ambulatory Visit
Admission: RE | Admit: 2018-02-11 | Discharge: 2018-02-11 | Disposition: A | Discharge status: Home / Self Care | Payer: Commercial Managed Care - PPO | Source: Ambulatory Visit | Attending: Surgery | Admitting: Surgery

## 2018-02-11 ENCOUNTER — Encounter
Admission: RE | Disposition: A | Discharge status: Home / Self Care | Payer: Self-pay | Source: Ambulatory Visit | Attending: Surgery

## 2018-02-11 ENCOUNTER — Ambulatory Visit: Payer: Commercial Managed Care - PPO

## 2018-02-11 DIAGNOSIS — C8330 Diffuse large B-cell lymphoma, unspecified site: Secondary | ICD-10-CM | POA: Diagnosis present

## 2018-02-11 DIAGNOSIS — C8580 Other specified types of non-Hodgkin lymphoma, unspecified site: Secondary | ICD-10-CM

## 2018-02-11 DIAGNOSIS — I10 Essential (primary) hypertension: Secondary | ICD-10-CM | POA: Diagnosis not present

## 2018-02-11 HISTORY — PX: PORTACATH PLACEMENT: SHX2246

## 2018-02-11 LAB — HEPATITIS B SURFACE ANTIGEN: Hepatitis B Surface Ag: NEGATIVE

## 2018-02-11 LAB — HEPATITIS C ANTIBODY: HCV Ab: <0.1 {s_co_ratio} (ref 0.0–0.9)

## 2018-02-11 LAB — GLUCOSE 6 PHOSPHATE DEHYDROGENASE
G6PDH: 15.6 U/g{Hb} — ABNORMAL HIGH (ref 4.6–13.5)
Hemoglobin: 11.7 g/dL — ABNORMAL LOW (ref 13.0–17.7)

## 2018-02-11 LAB — HIV ANTIBODY (ROUTINE TESTING W REFLEX): HIV Screen 4th Generation wRfx: NONREACTIVE

## 2018-02-11 LAB — BETA 2 MICROGLOBULIN, SERUM: Beta-2 Microglobulin: 2.4 mg/L (ref 0.6–2.4)

## 2018-02-11 LAB — HEPATITIS B CORE ANTIBODY, TOTAL: Hep B Core Total Ab: NEGATIVE

## 2018-02-11 SURGERY — INSERTION, TUNNELED CENTRAL VENOUS DEVICE, WITH PORT
Anesthesia: General | Site: Chest | Laterality: Right | Wound class: Clean

## 2018-02-11 MED ORDER — PROPOFOL 10 MG/ML IV BOLUS
INTRAVENOUS | Status: DC | PRN
  Administered 2018-02-11: 200 mg via INTRAVENOUS

## 2018-02-11 MED ORDER — LACTATED RINGERS IV SOLN
INTRAVENOUS | Status: DC
  Administered 2018-02-11: 09:00:00 via INTRAVENOUS

## 2018-02-11 MED ORDER — BUPIVACAINE-EPINEPHRINE (PF) 0.25% -1:200000 IJ SOLN
INTRAMUSCULAR | Status: DC | PRN
  Administered 2018-02-11: 20 mL

## 2018-02-11 MED ORDER — CHLORHEXIDINE GLUCONATE CLOTH 2 % EX PADS: 6.0000 | MEDICATED_PAD | Freq: Once | CUTANEOUS | Status: DC

## 2018-02-11 MED ORDER — CEFAZOLIN SODIUM-DEXTROSE 2-4 GM/100ML-% IV SOLN
INTRAVENOUS | Status: AC
  Filled 2018-02-11: qty 100

## 2018-02-11 MED ORDER — ACETAMINOPHEN 325 MG PO TABS: 325.0000 mg | ORAL_TABLET | ORAL | Status: DC | PRN

## 2018-02-11 MED ORDER — ONDANSETRON HCL 4 MG/2ML IJ SOLN
INTRAMUSCULAR | Status: DC | PRN
  Administered 2018-02-11: 4 mg via INTRAVENOUS

## 2018-02-11 MED ORDER — LIDOCAINE HCL (CARDIAC) PF 100 MG/5ML IV SOSY
PREFILLED_SYRINGE | INTRAVENOUS | Status: DC | PRN
  Administered 2018-02-11: 100 mg via INTRAVENOUS

## 2018-02-11 MED ORDER — BUPIVACAINE-EPINEPHRINE (PF) 0.25% -1:200000 IJ SOLN
INTRAMUSCULAR | Status: AC
  Filled 2018-02-11: qty 30

## 2018-02-11 MED ORDER — ACETAMINOPHEN 160 MG/5ML PO SOLN
325.0000 mg | ORAL | Status: DC | PRN
  Filled 2018-02-11: qty 20.3

## 2018-02-11 MED ORDER — ONDANSETRON HCL 4 MG/2ML IJ SOLN
INTRAMUSCULAR | Status: AC
  Filled 2018-02-11: qty 2

## 2018-02-11 MED ORDER — LIDOCAINE HCL (PF) 2 % IJ SOLN
INTRAMUSCULAR | Status: AC
  Filled 2018-02-11: qty 10

## 2018-02-11 MED ORDER — MIDAZOLAM HCL 2 MG/2ML IJ SOLN
INTRAMUSCULAR | Status: DC | PRN
  Administered 2018-02-11: 2 mg via INTRAVENOUS

## 2018-02-11 MED ORDER — HYDROMORPHONE HCL 1 MG/ML IJ SOLN: 0.2500 mg | INTRAMUSCULAR | Status: DC | PRN

## 2018-02-11 MED ORDER — PHENYLEPHRINE HCL 10 MG/ML IJ SOLN
INTRAMUSCULAR | Status: DC | PRN
  Administered 2018-02-11: 100 ug via INTRAVENOUS
  Administered 2018-02-11: 200 ug via INTRAVENOUS
  Administered 2018-02-11 (×2): 100 ug via INTRAVENOUS

## 2018-02-11 MED ORDER — FENTANYL CITRATE (PF) 100 MCG/2ML IJ SOLN
INTRAMUSCULAR | Status: DC | PRN
  Administered 2018-02-11 (×4): 50 ug via INTRAVENOUS

## 2018-02-11 MED ORDER — PROPOFOL 10 MG/ML IV BOLUS
INTRAVENOUS | Status: AC
  Filled 2018-02-11: qty 20

## 2018-02-11 MED ORDER — FENTANYL CITRATE (PF) 100 MCG/2ML IJ SOLN
INTRAMUSCULAR | Status: AC
  Filled 2018-02-11: qty 2

## 2018-02-11 MED ORDER — LIDOCAINE HCL (PF) 1 % IJ SOLN
INTRAMUSCULAR | Status: DC | PRN
  Administered 2018-02-11: 20 mL

## 2018-02-11 MED ORDER — MEPERIDINE HCL 50 MG/ML IJ SOLN: 6.2500 mg | INTRAMUSCULAR | Status: DC | PRN

## 2018-02-11 MED ORDER — SODIUM CHLORIDE 0.9 % IJ SOLN
INTRAMUSCULAR | Status: AC
  Filled 2018-02-11: qty 10

## 2018-02-11 MED ORDER — LIDOCAINE HCL (PF) 1 % IJ SOLN
INTRAMUSCULAR | Status: AC
  Filled 2018-02-11: qty 30

## 2018-02-11 MED ORDER — PROMETHAZINE HCL 25 MG/ML IJ SOLN: 6.2500 mg | INTRAMUSCULAR | Status: DC | PRN

## 2018-02-11 MED ORDER — HYDROCODONE-ACETAMINOPHEN 7.5-325 MG PO TABS
1.0000 | ORAL_TABLET | Freq: Once | ORAL | Status: AC | PRN
  Administered 2018-02-11: 1 via ORAL

## 2018-02-11 MED ORDER — CEFAZOLIN SODIUM-DEXTROSE 2-4 GM/100ML-% IV SOLN: 2.0000 g | Freq: Once | INTRAVENOUS | Status: DC

## 2018-02-11 MED ORDER — HYDROCODONE-ACETAMINOPHEN 7.5-325 MG PO TABS
ORAL_TABLET | ORAL | Status: AC
  Filled 2018-02-11: qty 1

## 2018-02-11 MED ORDER — MIDAZOLAM HCL 2 MG/2ML IJ SOLN
INTRAMUSCULAR | Status: AC
  Filled 2018-02-11: qty 2

## 2018-02-11 MED ORDER — HEPARIN SODIUM (PORCINE) 5000 UNIT/ML IJ SOLN
INTRAMUSCULAR | Status: AC
  Filled 2018-02-11: qty 1

## 2018-02-11 SURGICAL SUPPLY — 33 items
BAG DECANTER FOR FLEXI CONT (MISCELLANEOUS) ×3 IMPLANT
BLADE SURG SZ11 CARB STEEL (BLADE) ×3 IMPLANT
BOOT SUTURE AID YELLOW STND (SUTURE) ×3 IMPLANT
CANISTER SUCT 1200ML W/VALVE (MISCELLANEOUS) ×3 IMPLANT
CHLORAPREP W/TINT 26ML (MISCELLANEOUS) ×3 IMPLANT
COVER LIGHT HANDLE STERIS (MISCELLANEOUS) ×6 IMPLANT
DERMABOND ADVANCED (GAUZE/BANDAGES/DRESSINGS) ×2
DERMABOND ADVANCED .7 DNX12 (GAUZE/BANDAGES/DRESSINGS) ×1 IMPLANT
DRAPE C-ARM XRAY 36X54 (DRAPES) ×3 IMPLANT
DRAPE INCISE IOBAN 66X45 STRL (DRAPES) ×3 IMPLANT
DRAPE SHEET LG 3/4 BI-LAMINATE (DRAPES) ×3 IMPLANT
ELECT REM PT RETURN 9FT ADLT (ELECTROSURGICAL) ×3
ELECTRODE REM PT RTRN 9FT ADLT (ELECTROSURGICAL) ×1 IMPLANT
GEL ULTRASOUND 20GR AQUASONIC (MISCELLANEOUS) ×3 IMPLANT
GLOVE BIO SURGEON STRL SZ7 (GLOVE) ×3 IMPLANT
GOWN STRL REUS W/ TWL LRG LVL3 (GOWN DISPOSABLE) ×2 IMPLANT
GOWN STRL REUS W/TWL LRG LVL3 (GOWN DISPOSABLE) ×4
IV NS 500ML (IV SOLUTION) ×2
IV NS 500ML BAXH (IV SOLUTION) ×1 IMPLANT
KIT PORT POWER 8FR ISP CVUE (Port) ×3 IMPLANT
NEEDLE HYPO 22GX1.5 SAFETY (NEEDLE) ×3 IMPLANT
NS IRRIG 1000ML POUR BTL (IV SOLUTION) ×3 IMPLANT
PACK PORT-A-CATH (MISCELLANEOUS) ×3 IMPLANT
SPONGE LAP 18X18 RF (DISPOSABLE) ×3 IMPLANT
SUT MNCRL AB 4-0 PS2 18 (SUTURE) ×3 IMPLANT
SUT PROLENE 2-0 (SUTURE) ×2
SUT PROLENE 2-0 RB1 36X2 ARM (SUTURE) ×1
SUT VIC AB 3-0 SH 27 (SUTURE) ×2
SUT VIC AB 3-0 SH 27X BRD (SUTURE) ×1 IMPLANT
SUTURE PROLEN 2-0 RB1 36X2 ARM (SUTURE) ×1 IMPLANT
SYR 20CC LL (SYRINGE) ×3 IMPLANT
SYR 5ML LL (SYRINGE) ×3 IMPLANT
TOWEL OR 17X26 4PK STRL BLUE (TOWEL DISPOSABLE) ×3 IMPLANT

## 2018-02-11 NOTE — Op Note (Signed)
  Pre-operative Diagnosis: Lymphoma  Post-operative Diagnosis: same   Surgeon: Caroleen Hamman, MD FACS  Anesthesia: IV sedation, marcaine .25% w epi and lidocaine 1%  Procedure: right IJ  Port placement with fluoroscopy under U/S guidance  Findings: Good position of the tip of the catheter by fluoroscopy  Estimated Blood Loss: Minimal         Drains: None         Specimens: None       Complications: none          Procedure Details  The patient was seen again in the Holding Room. The benefits, complications, treatment options, and expected outcomes were discussed with the patient. The risks of bleeding, infection, recurrence of symptoms, failure to resolve symptoms,  thrombosis nonfunction breakage pneumothorax hemopneumothorax any of which could require chest tube or further surgery were reviewed with the patient.   The patient was taken to Operating Room, identified as Ronnie Mcintyre and the procedure verified.  A Time Out was held and the above information confirmed.  Prior to the induction of general anesthesia, antibiotic prophylaxis was administered. VTE prophylaxis was in place. Appropriate anesthesia was then administered and tolerated well. The chest was prepped with Chloraprep and draped in the sterile fashion. The patient was positioned in the supine position. Then the patient was placed in Trendelenburg position.  Patient was prepped and draped in sterile fashion and in a Trendelenburg position local anesthetic was infiltrated into the skin and subcutaneous tissues in the neck and anterior chest wall. The large bore needle was placed into the internal jugular vein under U/S guidance without difficulty and then the Seldinger wire was advanced. Fluoroscopy was utilized to confirm that the Seldinger wire was in the superior vena cava.  An incision was made and a port pocket developed with blunt and electrocautery dissection. The introducer dilator was placed over the Seldinger  wire the wire was removed. The previously flushed catheter was placed into the introducer dilator and the peel-away sheath was removed. The catheter length was confirmed and trimmed utilizing fluoroscopy for proper positioning. The catheter was then attached to the previously flushed port. The port was placed into the pocket. The port was held in with 2-0 Prolenes and flushed for function and heparin locked.  The wound was closed with interrupted 3-0 Vicryl followed by 4-0 subcuticular Monocryl sutures. Dermabond used to coat the skin  Patient was taken to the recovery room in stable condition where a postoperative chest film has been ordered.

## 2018-02-11 NOTE — H&P (Signed)
Patient ID: Ronnie Mcintyre, male   DOB: [birth year], 52 y.o.   MRN: [id number]  HPI Ronnie Mcintyre is a 52 y.o. male RECENTLY DX W LYMPHOMA IN NEED FOR PORT. nO EASY BRUISING, NML inr, pl. No contraindication for port placement  HPI  Past Medical History:  Diagnosis Date  . Hypertension     Past Surgical History:  Procedure Laterality Date  . ANKLE SURGERY Right     Family History  Problem Relation Age of Onset  . Hypertension Mother   . Hypertension Father   . Multiple myeloma Father     Social History Social History   Tobacco Use  . Smoking status: Never Smoker  . Smokeless tobacco: Never Used  Substance Use Topics  . Alcohol use: Yes    Frequency: Never    Comment: rarely  . Drug use: No    No Known Allergies  Current Facility-Administered Medications  Medication Dose Route Frequency Provider Last Rate Last Dose  . ceFAZolin (ANCEF) 2-4 GM/100ML-% IVPB           . ceFAZolin (ANCEF) IVPB 2g/100 mL premix  2 g Intravenous On Call to Ronnie Mcintyre, Peggs, MD      . Chlorhexidine Gluconate Cloth 2 % PADS 6 each  6 each Topical Once Ronnie Mcintyre, Ronnie Lies, MD       And  . Chlorhexidine Gluconate Cloth 2 % PADS 6 each  6 each Topical Once Ronnie Mcintyre, Ronnie F, MD      . lactated ringers infusion   Intravenous Continuous Ronnie Sias, MD 50 mL/hr at 02/11/18 0831       Review of Systems Full ROS  was asked and was negative except for the information on the HPI  Physical Exam Blood pressure 120/76, pulse (!) 114, temperature 97.6 Mcintyre (36.4 C), temperature source Temporal, resp. rate 20, height 6' (1.829 m), weight 257 lb (116.6 kg), SpO2 98 %. CONSTITUTIONAL: NAd  LYMPH NODES:  Lymph nodes in the neck are normal. RESPIRATORY:  Lungs are clear. There is normal respiratory effort, with equal breath sounds bilaterally, and without pathologic use of accessory muscles. CARDIOVASCULAR: Heart is regular without murmurs, gallops, or rubs. GI: The abdomen is soft, nontender, and  nondistended. There are no palpable masses. There is no hepatosplenomegaly. There are normal bowel sounds in all quadrants. GU: Rectal deferred.   MUSCULOSKELETAL: Normal muscle strength and tone. No cyanosis or edema.   SKIN: Turgor is good and there are no pathologic skin lesions or ulcers. NEUROLOGIC: Motor and sensation is grossly normal. Cranial nerves are grossly intact. PSYCH:  Oriented to person, place and time. Affect is normal.  Data Reviewed  I have personally reviewed the patient's imaging, laboratory findings and medical records.    Assessment/ Plan LARGE B-CELL LYMPHOMA d/w the pt about port. Risks, benefits and possible complications including but not limited to PTX, bleeding, vascular injury, infection. He understands and wishes to proceed   Ronnie Hamman, MD FACS General Surgeon 02/11/2018, 8:34 AM

## 2018-02-11 NOTE — Discharge Instructions (Signed)

## 2018-02-11 NOTE — Anesthesia Post-op Follow-up Note (Signed)
Anesthesia QCDR form completed.        

## 2018-02-11 NOTE — Anesthesia Preprocedure Evaluation (Signed)
Anesthesia Evaluation  Patient identified by MRN, date of birth, ID band Patient awake    Reviewed: Allergy & Precautions, H&P , NPO status , reviewed documented beta blocker date and time   Airway Mallampati: II  TM Distance: >3 FB     Dental  (+) Teeth Intact   Pulmonary    Pulmonary exam normal        Cardiovascular hypertension, Normal cardiovascular exam  12/2017 ECHO Study Conclusions  - Left ventricle: The cavity size was normal. There was mild   concentric hypertrophy. Systolic function was normal. The   estimated ejection fraction was in the range of 55% to 60%. Wall   motion was normal; there were no regional wall motion   abnormalities. Left ventricular diastolic function parameters   were normal. - Aorta: Aortic root dimension: 40 mm (ED). - Aortic root: The aortic root was mildly dilated. - Pulmonary arteries: Systolic pressure could not be accurately   estimated. - Pericardium, extracardiac: A trivial pericardial effusion was   identified.   Neuro/Psych    GI/Hepatic neg GERD  ,  Endo/Other    Renal/GU Renal disease     Musculoskeletal   Abdominal   Peds  Hematology   Anesthesia Other Findings Right anterior mediastinal mass overlying ascending Ao, no tracheal compression.    Past Medical History: No date: Hypertension Past Surgical History: No date: ANKLE SURGERY; Right BMI    Body Mass Index:  34.86 kg/m     Reproductive/Obstetrics                             Anesthesia Physical Anesthesia Plan  ASA: III  Anesthesia Plan: General LMA   Post-op Pain Management:    Induction:   PONV Risk Score and Plan: Ondansetron and Treatment may vary due to age or medical condition  Airway Management Planned:   Additional Equipment:   Intra-op Plan:   Post-operative Plan:   Informed Consent: I have reviewed the patients History and Physical, chart, labs and  discussed the procedure including the risks, benefits and alternatives for the proposed anesthesia with the patient or authorized representative who has indicated his/her understanding and acceptance.   Dental Advisory Given  Plan Discussed with: CRNA  Anesthesia Plan Comments:         Anesthesia Quick Evaluation

## 2018-02-11 NOTE — Transfer of Care (Signed)
Immediate Anesthesia Transfer of Care Note  Patient: Ronnie Mcintyre  Procedure(s) Performed: INSERTION PORT-A-CATH (Right Chest)  Patient Location: PACU  Anesthesia Type:General  Level of Consciousness: awake and alert   Airway & Oxygen Therapy: Patient connected to face mask oxygen  Post-op Assessment: Post -op Vital signs reviewed and stable  Post vital signs: stable  Last Vitals:  Vitals Value Taken Time  BP 115/71 02/11/2018 11:47 AM  Temp 36.9 C 02/11/2018 11:47 AM  Pulse 89 02/11/2018 11:47 AM  Resp 12 02/11/2018 11:47 AM  SpO2 99 % 02/11/2018 11:47 AM    Last Pain:  Vitals:   02/11/18 1147  TempSrc: Temporal  PainSc:       Patients Stated Pain Goal: 0 (74/82/70 7867)  Complications: No apparent anesthesia complications

## 2018-02-11 NOTE — Anesthesia Procedure Notes (Addendum)
Procedure Name: LMA Insertion Performed by: Wilho Sharpley, CRNA Pre-anesthesia Checklist: Patient identified, Patient being monitored, Timeout performed, Emergency Drugs available and Suction available Patient Re-evaluated:Patient Re-evaluated prior to induction Oxygen Delivery Method: Circle system utilized Preoxygenation: Pre-oxygenation with 100% oxygen Induction Type: IV induction LMA: LMA inserted LMA Size: 5.0 Tube type: Oral Number of attempts: 1 Placement Confirmation: positive ETCO2 and breath sounds checked- equal and bilateral Tube secured with: Tape Dental Injury: Teeth and Oropharynx as per pre-operative assessment        

## 2018-02-11 NOTE — Telephone Encounter (Signed)
Patient called stating that today he had a port-a-cath placed and after he woke up he had a headache. He stated that he had mentioned this to the nurse at the hospital and he was given something for the headache. However, now that he is at home he started to have the headaches again but only when he lays down. He stated that when he stands or sits, his headache stops. Patient denied any nausea, vomiting or dizziness. I asked patient if he had tries anything over the counter and he stated that he had not. I then asked to put patient on hold. I called Dr. Dahlia Byes and told him about his patient's symptome. He stated that it could be a side effect of the anesthesia. He recommended for him to take Naproxen 220 MG twice a day until his headache is gone or to try Excedrin as directed on the box for his headache. He also stated that if this would take the headache away, then for the patient to call his PCP to be seen since he doesn't treat headaches. I then got back on the phone and explained what was recommended by Dr. Dahlia Byes. Patient understood recommendations and had no further questions. I told patient to call us in case he had further questions.

## 2018-02-12 ENCOUNTER — Other Ambulatory Visit: Payer: Self-pay | Admitting: Urgent Care

## 2018-02-12 ENCOUNTER — Encounter: Admission: RE | Payer: Self-pay | Source: Ambulatory Visit

## 2018-02-12 ENCOUNTER — Ambulatory Visit
Admission: RE | Admit: 2018-02-12 | Payer: Commercial Managed Care - PPO | Source: Ambulatory Visit | Admitting: Vascular Surgery

## 2018-02-12 DIAGNOSIS — C8510 Unspecified B-cell lymphoma, unspecified site: Secondary | ICD-10-CM

## 2018-02-12 DIAGNOSIS — J9859 Other diseases of mediastinum, not elsewhere classified: Secondary | ICD-10-CM

## 2018-02-12 DIAGNOSIS — Z9189 Other specified personal risk factors, not elsewhere classified: Secondary | ICD-10-CM

## 2018-02-12 SURGERY — PORTA CATH INSERTION
Anesthesia: Moderate Sedation

## 2018-02-12 MED ORDER — PANTOPRAZOLE SODIUM 40 MG PO TBEC: 40.0000 mg | DELAYED_RELEASE_TABLET | Freq: Every day | ORAL | 1 refills | Status: DC

## 2018-02-12 MED ORDER — ONDANSETRON HCL 8 MG PO TABS: 8.0000 mg | ORAL_TABLET | Freq: Two times a day (BID) | ORAL | 1 refills | Status: DC | PRN

## 2018-02-12 MED ORDER — LIDOCAINE-PRILOCAINE 2.5-2.5 % EX CREA: TOPICAL_CREAM | CUTANEOUS | 3 refills | Status: DC

## 2018-02-12 MED ORDER — LORAZEPAM 0.5 MG PO TABS: 0.5000 mg | ORAL_TABLET | Freq: Four times a day (QID) | ORAL | 0 refills | Status: DC | PRN

## 2018-02-12 MED ORDER — PREDNISONE 50 MG PO TABS: 40.0000 mg/m2 | ORAL_TABLET | Freq: Every day | ORAL | 1 refills | Status: DC

## 2018-02-12 NOTE — Progress Notes (Signed)
START ON PATHWAY REGIMEN - Lymphoma and CLL     A cycle is every 21 days:     Prednisone      Rituximab      Cyclophosphamide      Doxorubicin      Vincristine   **Always confirm dose/schedule in your pharmacy ordering system**  Patient Characteristics: Diffuse Large B-Cell Lymphoma, First Line, Stage I and II, Bulky Disease (10 cm or > 1/3 Diameter of Chest) Disease Type: Not Applicable Disease Type: Diffuse Large B-Cell Lymphoma Disease Type: Not Applicable Line of therapy: First Line [place] Stage: I Disease Characteristics: Bulky Disease Intent of Therapy: Curative Intent, Discussed with Patient

## 2018-02-15 ENCOUNTER — Encounter: Payer: Self-pay | Admitting: Urgent Care

## 2018-02-15 ENCOUNTER — Telehealth: Payer: Self-pay | Admitting: Urgent Care

## 2018-02-15 ENCOUNTER — Encounter: Payer: Self-pay | Admitting: Hematology and Oncology

## 2018-02-15 DIAGNOSIS — R0789 Other chest pain: Secondary | ICD-10-CM | POA: Insufficient documentation

## 2018-02-15 LAB — SURGICAL PATHOLOGY

## 2018-02-15 NOTE — Telephone Encounter (Signed)
Re: Plans for initiation of chemotherapy  Communicated with patient regarding approval from insurance company to proceed with treatment. Patient asking to hold off until later in the week. Due to space limitations in the infusion center, this was not a feasible option. Patient was offered a spot tomorrow. He ultimately accepted.   Patient to RTC on 02/16/2018 for: -0815 - port access and labs. Advised to use prescribed EMLA.  -0830 - MD assessment to review labs and answer any outstanding questions.  -0900 - initiation of treatment. Patient advised to plan on being in the infusion center all day (7-8 hours).   I advised patient that I would be willing to provide documentation to his job making them aware of late scheduling. Ultimately, we were at the mercy of his insurance company. Patient to let me know what documentation is required. Patient in agreement with plan of care as it stands at this time. Patient anxious about getting started, however notes that he is ready to get it done and over with.   Honor Loh, MSN, APRN, FNP-C, CEN Oncology/Hematology Nurse Practitioner  [facility] 02/15/18, 6:52 PM

## 2018-02-15 NOTE — Progress Notes (Signed)
[facility] day:  02/16/2018   Chief Complaint: Ronnie Mcintyre is a 52 y.o. 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   HPI:  The patient was last seen in the medical oncology clinic on 02/10/2018. At that time, patient complained of increased chest pain. He was unable to lie supine due to pain exacerbation. Patient not sleeping well (average 3-4 hours/night). Sweating had resolved. Exam was unremarkable. Awaiting final pathology from [facility]. Additional lab studies were ordered.   Work up on 02/10/2018 - Hepatitis and HIV serologies (-). Uric acid normal at 5.0. G6PD 15.6 (4.6 - 13.5 U/g).  Beta-2 microglobulin normal. LDH was elevated at 346 (98 - 192 U/L). PSA normal at 0.48. Potassium low at 2.9; started on oral KCL 20 mEq daily x 3 days. He was also started on Allopurinol 300 mg daily to prevent hyperuricemia associated with tumor lysis.   Patient attended chemotherapy education class with Magdalene Patricia, RN on 02/10/2018. He was provided with written information regarding his proposed course of chemotherapy treatments. Patient was given the opportunity to have his preliminary clinical questions fielded by cancer center nurse navigator.   Patient underwent vascular access device (port) placement on 02/11/2018 with Dr. Caroleen Hamman. Notes reviewed. Patient had an uncomplicated procedural course.   Final pathology was received from [facility]. Patient's tumor found to be BCL-6 and BCL-2 (+) and c-MYC (-). Patient was contacted and made aware of the results.  Outpatient treatment with R-CHOP was re-reviewed.   Due to insurance approval delays, patient was unable to begin chemotherapy treatments on 02/12/2018 as planned. He was to start prednisone 100 mg daily x 5 days in efforts to reduce the adenopathy in the patient's chest, thus improving his complaints of pain.   Additionally, he was started on PPI therapy (Protonix 40 mg) daily while taking steroids.   In the interim, he has felt about the same.  He did not start his steroids.  He has only been on allopurinol.  He has felt a little uncomfortable since his port-a-cath placement.  He has had some soreness in his right shoulder.  He states that his swallowing is a little more difficult.  He still has chest discomfort.  He describes a pain under his arm to his elbow.     Past Medical History:  Diagnosis Date  . Hypertension     Past Surgical History:  Procedure Laterality Date  . ANKLE SURGERY Right   . PORTACATH PLACEMENT Right 02/11/2018   Procedure: INSERTION PORT-A-CATH;  Surgeon: Jules Husbands, MD;  Location: [facility] ORS;  Service: General;  Laterality: Right;    Family History  Problem Relation Age of Onset  . Hypertension Mother   . Hypertension Father   . Multiple myeloma Father     Social History:  reports that he has never smoked. He has never used smokeless tobacco. He reports that he drinks alcohol. He reports that he does not use drugs.  He drinks a beer 1-2 x/week.  He works for [employer].  He lives in [place].  His is alone today.  His father is in the waiting room.  Allergies: No Known Allergies  Current Medications: Current Outpatient Medications  Medication Sig Dispense Refill  . acetaminophen (TYLENOL) 325 MG tablet Take 2 tablets (650 mg total) by mouth every 6 (six) hours as needed for mild pain (or Fever >/=  101).    . aspirin 81 MG tablet Take 81 mg by mouth daily.     . felodipine (PLENDIL) 5 MG 24 hr tablet Take 5 mg by mouth daily.    . fluticasone (FLONASE) 50 MCG/ACT nasal spray Place 2 sprays into both nostrils daily. 16 g 0  . hydrocortisone 1 % lotion Apply 1 application topically 2 (two) times daily as needed for itching.    . lidocaine-prilocaine (EMLA) cream Apply to affected area once 30 g 3  . loratadine (CLARITIN) 10 MG tablet Take 10 mg by mouth  daily as needed for allergies.     [name] LORazepam (ATIVAN) 0.5 MG tablet Take 1 tablet (0.5 mg total) by mouth every 6 (six) hours as needed (Nausea or vomiting). 30 tablet 0  . losartan-hydrochlorothiazide (HYZAAR) 100-25 MG tablet Take 1 tablet by mouth daily.    . ondansetron (ZOFRAN) 8 MG tablet Take 1 tablet (8 mg total) by mouth 2 (two) times daily as needed. Start on day 3 after cyclophosphamide chemotherapy. 30 tablet 1  . oxyCODONE (OXY IR/ROXICODONE) 5 MG immediate release tablet Take 1 tablet (5 mg total) by mouth every 6 (six) hours as needed for moderate pain. 30 tablet 0  . pantoprazole (PROTONIX) 40 MG tablet Take 1 tablet (40 mg total) by mouth daily. Use daily while on Prednisone for GI prophylaxis. 30 tablet 1  . potassium chloride SA (K-DUR,KLOR-CON) 20 MEQ tablet Take 1 tablet (20 mEq) daily x 3 days. Reserve additional tabs for use as directed by oncology. 6 tablet 0  . predniSONE (DELTASONE) 50 MG tablet Take 2 tablets (100 mg total) by mouth daily. Take on days 1-5 of chemotherapy. 60 tablet 1  . simvastatin (ZOCOR) 40 MG tablet Take 40 mg by mouth daily.    [name] allopurinol (ZYLOPRIM) 300 MG tablet Take 1 tablet (300 mg total) by mouth daily. (Patient not taking: Reported on 02/11/2018) 90 tablet 1   No current facility-administered medications for this visit.    Facility-Administered Medications Ordered in Other Visits  Medication Dose Route Frequency Provider Last Rate Last Dose  . heparin lock flush 100 unit/mL  500 Units Intravenous Once Corcoran, Melissa C, MD      . sodium chloride flush (NS) 0.9 % injection 10 mL  10 mL Intravenous PRN Lequita Asal, MD   10 mL at 02/16/18 0820    Review of Systems  Constitutional: Positive for weight loss (3 pounds). Negative for diaphoresis, fever and malaise/fatigue.  HENT: Negative.   Eyes: Negative.   Respiratory: Negative for cough, hemoptysis, sputum production and shortness of breath.   Cardiovascular: Negative for chest  pain, palpitations, orthopnea, leg swelling and PND.       Generalized chest discomfort. Inability to maintain supine position due to pain.   Gastrointestinal: Negative for abdominal pain, blood in stool, constipation, diarrhea, melena, nausea and vomiting.       Slight swallowing issues.  Genitourinary: Negative for dysuria, frequency, hematuria and urgency.  Musculoskeletal: Negative for back pain, falls, joint pain and myalgias.       Chest discomfort secondary to port-a-cath placement.  Pain under right arm to elbow.  Skin: Negative for itching and rash.  Neurological: Negative for dizziness, tremors, weakness and headaches.  Endo/Heme/Allergies: Does not bruise/bleed easily.  Psychiatric/Behavioral: Negative for depression, memory loss and suicidal ideas. The patient is nervous/anxious and has insomnia.   All other systems reviewed and are negative.  Performance status (ECOG): 1 - Symptomatic but completely ambulatory  Vital Signs BP 126/89 (BP Location: Left Arm, Patient Position: Sitting)   Pulse 87   Temp (!) 96.5 F (35.8 C) (Tympanic)   Resp 18   Wt 256 lb 6 oz (116.3 kg)   BMI 34.77 kg/m   Physical Exam  Constitutional: He is oriented to person, place, and time and well-developed, well-nourished, and in no distress.  HENT:  Head: Normocephalic and atraumatic.  Short dark hair.  Scruffy graying beard.  Eyes: Pupils are equal, round, and reactive to light. EOM are normal. No scleral icterus.  Blue eyes.   Neck: Normal range of motion. Neck supple. No tracheal deviation present. No thyromegaly present.  Cardiovascular: Normal rate, regular rhythm and normal heart sounds. Exam reveals no gallop and no friction rub.  No murmur heard. Pulmonary/Chest: Effort normal and breath sounds normal. No respiratory distress. He has no wheezes. He has no rales. He exhibits no tenderness.  Abdominal: Soft. Bowel sounds are normal. He exhibits no distension and no mass. There is no  tenderness.  Musculoskeletal: Normal range of motion. He exhibits no edema or tenderness.  Lymphadenopathy:       Head (right side): No submental, no submandibular, no preauricular, no posterior auricular and no occipital adenopathy present.       Head (left side): No submental, no submandibular, no preauricular, no posterior auricular and no occipital adenopathy present.    He has no cervical adenopathy.       Right cervical: No superficial cervical, no deep cervical and no posterior cervical adenopathy present.      Left cervical: No superficial cervical, no deep cervical and no posterior cervical adenopathy present.    He has no axillary adenopathy.       Right: No inguinal and no supraclavicular adenopathy present.       Left: No inguinal and no supraclavicular adenopathy present.  Neurological: He is alert and oriented to person, place, and time. He has normal reflexes.  Skin: Skin is warm and dry. No rash noted. No erythema.  Psychiatric: Mood, affect and judgment normal.  Nursing note and vitals reviewed.   Infusion on 02/16/2018  Component Date Value Ref Range Status  . Sodium 02/16/2018 139  135 - 145 mmol/L Final  . Potassium 02/16/2018 3.6  3.5 - 5.1 mmol/L Final  . Chloride 02/16/2018 103  98 - 111 mmol/L Final  . CO2 02/16/2018 25  22 - 32 mmol/L Final  . Glucose, Bld 02/16/2018 122* 70 - 99 mg/dL Final  . BUN 02/16/2018 20  6 - 20 mg/dL Final  . Creatinine, Ser 02/16/2018 1.08  0.61 - 1.24 mg/dL Final  . Calcium 02/16/2018 9.3  8.9 - 10.3 mg/dL Final  . Total Protein 02/16/2018 7.5  6.5 - 8.1 g/dL Final  . Albumin 02/16/2018 3.5  3.5 - 5.0 g/dL Final  . AST 02/16/2018 22  15 - 41 U/L Final  . ALT 02/16/2018 27  0 - 44 U/L Final  . Alkaline Phosphatase 02/16/2018 79  38 - 126 U/L Final  . Total Bilirubin 02/16/2018 0.7  0.3 - 1.2 mg/dL Final  . GFR calc non Af Amer 02/16/2018 >60  >60 mL/min Final  . GFR calc Af Amer 02/16/2018 >60  >60 mL/min Final   Comment:  (NOTE) The eGFR has been calculated using the CKD EPI equation. This calculation has not been validated in all clinical situations. eGFR's persistently <60 mL/min signify possible Chronic Kidney Disease.   . Anion gap 02/16/2018 11  5 - 15  Final   Performed at [facility], [address], [place], [place] [address]  . LDH 02/16/2018 301* 98 - 192 U/L Final   Performed at [facility], [address] [place], [place] [address]  . WBC 02/16/2018 9.6  3.8 - 10.6 K/uL Final  . RBC 02/16/2018 4.65  4.40 - 5.90 MIL/uL Final  . Hemoglobin 02/16/2018 13.0  13.0 - 18.0 g/dL Final  . HCT 02/16/2018 37.7* 40.0 - 52.0 % Final  . MCV 02/16/2018 81.2  80.0 - 100.0 fL Final  . MCH 02/16/2018 28.1  26.0 - 34.0 pg Final  . MCHC 02/16/2018 34.6  32.0 - 36.0 g/dL Final  . RDW 02/16/2018 16.7* 11.5 - 14.5 % Final  . Platelets 02/16/2018 317  150 - 440 K/uL Final  . Neutrophils Relative % 02/16/2018 81  % Final  . Neutro Abs 02/16/2018 7.7* 1.4 - 6.5 K/uL Final  . Lymphocytes Relative 02/16/2018 9  % Final  . Lymphs Abs 02/16/2018 0.9* 1.0 - 3.6 K/uL Final  . Monocytes Relative 02/16/2018 6  % Final  . Monocytes Absolute 02/16/2018 0.6  0.2 - 1.0 K/uL Final  . Eosinophils Relative 02/16/2018 3  % Final  . Eosinophils Absolute 02/16/2018 0.2  0 - 0.7 K/uL Final  . Basophils Relative 02/16/2018 1  % Final  . Basophils Absolute 02/16/2018 0.1  0 - 0.1 K/uL Final   Performed at [facility], [address] [place],  [address]    Assessment:  Ronnie Mcintyre is a 52 y.o. 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  [facility] pathology revealed necrotic tissue. [facility] consultation revealed neoplastic cells are positive for CD20, BCL-6, MUM-1, and BCL-2. The neoplastic cells were negative for CD30, CD3, , CD10, c-MYC, EBV ISH, AE1/3, and PAX-8. Ki67 is 60-70%. These findings were consistent with a large B-cell lymphoma with an activated  B-cell phenotype. The absence of CD30 expression makes primary mediastinal large B-cell lymphoma less likely. FISH for [employer] rearrangement is pending. IPI score, age adjusted IPI, are low (1) and NCCN IPI score is low-intermediate (2).  He presented with upper chest fullness with radiation to his neck and arm. He has had B symptoms (sweats and weight loss) worrisome for lymphoma. LDHis 273. Uric acid was normal.  Beta-HCG and AFP were normal on 01/16/2018.  Chest CT angiogramon 01/15/2018 revealed an 11.3 x 8.0 cm anterior mediastinal mass.  Abdomen and pelvic CT on 01/16/2018 revealed a 6.3 x 5.6 cm soft tissue mass or medium density fluid filling the urinary bladder.  There was a 1.5 cm mildly complex lower pole right renal cyst. There was a 1.9 cm indeterminate left adrenal mass.  There was a small pericardial effusion.  Renal ultrasound on 01/25/2018 revealed no evidence of bladder mass.  Finding on prior CT exam likely represented excretion of residual contrast material from a CTA chest exam performed 1 day prior   PET scan on 01/26/2018 revealed an 11.1 cm intensely hypermetabolic and partially necrotic right anterior mediastinal mass, compatible with malignancy. There was diffuse marrow hypermetabolism, nonspecific, cannot exclude an infiltrative neoplastic marrow process. There was no focal skeletal hypermetabolism. There was no focal bone lesions on the CT images.  There was nonspecific mild heterogeneous prostatic hypermetabolism.  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    Bone marrow biopsy on 02/03/2018 revealed a slightly hypercellular bone marrow for  age with trilineage hematopoiesis.  There was no monoclonal B-cell population or abnormal T-cell phenotype identified.  Cytogenetics are normal (46, XY).  Echo on 01/15/2018 revealed an EF of 55-60%.  Hepatitis B, hepatitis C, and HIV testing was negative on 02/10/2018.   G6PD assay was normal.  Symptomatically, he has ongoing chest discomfort.  He is slightly uncomfortable s/p port-a-cath placement.  He continues to lose weight.  Exam reveals no palpable adenopathy.  Plan: 1. Labs today:  CBC with diff, CMP, Mg, uric acid, LDH 2. Review testing from 02/10/2018.  Hepatitis serologies and HIV testing are negative/normal.  G6PD assay is normal.  3. Review final pathology results from [facility]. 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 4. Review plans for treatment with curative intent. Patient to receive R-CHOP chemotherapy cycle every 21 days x 6 cycles. Patient understands proposed treatment plans, and provides verbal consent to proceed with his initial treatment today as planned.  5. Discuss increased risk for TLS. Will monitor routine tumor lysis labs. Patient on preventative allopurinol 300 mg daily to prevent hyperuricemia and resulting nephropathy. Encouraged patient to maintain adequate hydration. 6. Discuss HYPOkalemia. Potassium has improved to 3.6 today. Will hold further supplementation at this time as there is the potential for an elevated potassium level related to tumor lysis.   7. Labs reviewed. Blood counts stable and adequate enough for treatment. Will proceed with cycle #1 R-CHOP.  8. Discuss symptom management.  Patient has antiemetics and pain medications at home to use on a PRN basis.  Patient advising that the  prescribed interventions are adequate at this point. Continue all medications as previously prescribed.  9. RTC on 02/17/2018 for Neulasta and labs (BMP, uric acid). 10. RTC on 02/19/2018 for labs (BMP, uric acid). 11. RTC on 02/25/2018 for nadir assessment, labs (CBC with diff, CMP, uric acid). 12. RTC on 03/09/2018 for MD assessment, labs (CBC with diff, CMP, Mg, uric acid, LDH), and cycle #2 R-CHOP.   Nolon Stalls, MD 02/16/2018,8:49 AM

## 2018-02-16 ENCOUNTER — Inpatient Hospital Stay (HOSPITAL_BASED_OUTPATIENT_CLINIC_OR_DEPARTMENT_OTHER): Payer: Commercial Managed Care - PPO | Admitting: Hematology and Oncology

## 2018-02-16 ENCOUNTER — Inpatient Hospital Stay: Payer: Commercial Managed Care - PPO

## 2018-02-16 ENCOUNTER — Encounter: Payer: Self-pay | Admitting: Urgent Care

## 2018-02-16 ENCOUNTER — Other Ambulatory Visit: Payer: Commercial Managed Care - PPO

## 2018-02-16 ENCOUNTER — Encounter (HOSPITAL_COMMUNITY): Payer: Self-pay | Admitting: Hematology and Oncology

## 2018-02-16 ENCOUNTER — Encounter: Payer: Self-pay | Admitting: Hematology and Oncology

## 2018-02-16 VITALS — BP 126/89 | HR 87 | Temp 96.5°F | Resp 18 | Wt 256.4 lb

## 2018-02-16 DIAGNOSIS — E876 Hypokalemia: Secondary | ICD-10-CM

## 2018-02-16 DIAGNOSIS — R0789 Other chest pain: Secondary | ICD-10-CM

## 2018-02-16 DIAGNOSIS — Z9189 Other specified personal risk factors, not elsewhere classified: Secondary | ICD-10-CM

## 2018-02-16 DIAGNOSIS — C8510 Unspecified B-cell lymphoma, unspecified site: Secondary | ICD-10-CM

## 2018-02-16 DIAGNOSIS — Z5111 Encounter for antineoplastic chemotherapy: Secondary | ICD-10-CM

## 2018-02-16 DIAGNOSIS — R9341 Abnormal radiologic findings on diagnostic imaging of renal pelvis, ureter, or bladder: Secondary | ICD-10-CM | POA: Diagnosis not present

## 2018-02-16 DIAGNOSIS — Z7189 Other specified counseling: Secondary | ICD-10-CM

## 2018-02-16 LAB — COMPREHENSIVE METABOLIC PANEL
ALT: 27 U/L (ref 0–44)
AST: 22 U/L (ref 15–41)
Albumin: 3.5 g/dL (ref 3.5–5.0)
Alkaline Phosphatase: 79 U/L (ref 38–126)
Anion gap: 11 (ref 5–15)
BILIRUBIN TOTAL: 0.7 mg/dL (ref 0.3–1.2)
BUN: 20 mg/dL (ref 6–20)
CALCIUM: 9.3 mg/dL (ref 8.9–10.3)
CO2: 25 mmol/L (ref 22–32)
CREATININE: 1.08 mg/dL (ref 0.61–1.24)
Chloride: 103 mmol/L (ref 98–111)
GFR calc non Af Amer: >60 mL/min (ref >60)
Glucose, Bld: 122 mg/dL — ABNORMAL HIGH (ref 70–99)
Potassium: 3.6 mmol/L (ref 3.5–5.1)
Sodium: 139 mmol/L (ref 135–145)
TOTAL PROTEIN: 7.5 g/dL (ref 6.5–8.1)

## 2018-02-16 LAB — CBC WITH DIFFERENTIAL/PLATELET
Basophils Absolute: 0.1 10*3/uL (ref 0–0.1)
Basophils Relative: 1 %
Eosinophils Absolute: 0.2 10*3/uL (ref 0–0.7)
Eosinophils Relative: 3 %
HCT: 37.7 % — ABNORMAL LOW (ref 40.0–52.0)
Hemoglobin: 13 g/dL (ref 13.0–18.0)
Lymphocytes Relative: 9 %
Lymphs Abs: 0.9 10*3/uL — ABNORMAL LOW (ref 1.0–3.6)
MCH: 28.1 pg (ref 26.0–34.0)
MCHC: 34.6 g/dL (ref 32.0–36.0)
MCV: 81.2 fL (ref 80.0–100.0)
Monocytes Absolute: 0.6 10*3/uL (ref 0.2–1.0)
Monocytes Relative: 6 %
Neutro Abs: 7.7 10*3/uL — ABNORMAL HIGH (ref 1.4–6.5)
Neutrophils Relative %: 81 %
Platelets: 317 10*3/uL (ref 150–440)
RBC: 4.65 MIL/uL (ref 4.40–5.90)
RDW: 16.7 % — ABNORMAL HIGH (ref 11.5–14.5)
WBC: 9.6 10*3/uL (ref 3.8–10.6)

## 2018-02-16 LAB — URIC ACID: URIC ACID, SERUM: 4.6 mg/dL (ref 3.7–8.6)

## 2018-02-16 LAB — LACTATE DEHYDROGENASE: LDH: 301 U/L — AB (ref 98–192)

## 2018-02-16 MED ORDER — DIPHENHYDRAMINE HCL 25 MG PO CAPS
50.0000 mg | ORAL_CAPSULE | Freq: Once | ORAL | Status: AC
  Administered 2018-02-16: 50 mg via ORAL
  Filled 2018-02-16: qty 2

## 2018-02-16 MED ORDER — SODIUM CHLORIDE 0.9 % IV SOLN
750.0000 mg/m2 | Freq: Once | INTRAVENOUS | Status: AC
  Administered 2018-02-16: 1820 mg via INTRAVENOUS
  Filled 2018-02-16: qty 91

## 2018-02-16 MED ORDER — HEPARIN SOD (PORK) LOCK FLUSH 100 UNIT/ML IV SOLN
500.0000 [IU] | Freq: Once | INTRAVENOUS | Status: AC
  Administered 2018-02-16: 500 [IU] via INTRAVENOUS
  Filled 2018-02-16: qty 5

## 2018-02-16 MED ORDER — ACETAMINOPHEN 325 MG PO TABS
650.0000 mg | ORAL_TABLET | Freq: Once | ORAL | Status: AC
  Administered 2018-02-16: 650 mg via ORAL
  Filled 2018-02-16: qty 2

## 2018-02-16 MED ORDER — VINCRISTINE SULFATE CHEMO INJECTION 1 MG/ML
2.0000 mg | Freq: Once | INTRAVENOUS | Status: AC
  Administered 2018-02-16: 2 mg via INTRAVENOUS
  Filled 2018-02-16: qty 2

## 2018-02-16 MED ORDER — SODIUM CHLORIDE 0.9 % IV SOLN
375.0000 mg/m2 | Freq: Once | INTRAVENOUS | Status: AC
  Administered 2018-02-16: 900 mg via INTRAVENOUS
  Filled 2018-02-16: qty 50

## 2018-02-16 MED ORDER — DOXORUBICIN HCL CHEMO IV INJECTION 2 MG/ML
50.0000 mg/m2 | Freq: Once | INTRAVENOUS | Status: AC
  Administered 2018-02-16: 122 mg via INTRAVENOUS
  Filled 2018-02-16: qty 61

## 2018-02-16 MED ORDER — PALONOSETRON HCL INJECTION 0.25 MG/5ML
0.2500 mg | Freq: Once | INTRAVENOUS | Status: AC
  Administered 2018-02-16: 0.25 mg via INTRAVENOUS
  Filled 2018-02-16: qty 5

## 2018-02-16 MED ORDER — SODIUM CHLORIDE 0.9 % IV SOLN: 10.0000 mg | Freq: Once | INTRAVENOUS | Status: DC

## 2018-02-16 MED ORDER — SODIUM CHLORIDE 0.9 % IV SOLN
Freq: Once | INTRAVENOUS | Status: AC
  Administered 2018-02-16: 10:00:00 via INTRAVENOUS
  Filled 2018-02-16: qty 1000

## 2018-02-16 MED ORDER — DEXAMETHASONE SODIUM PHOSPHATE 10 MG/ML IJ SOLN
10.0000 mg | Freq: Once | INTRAMUSCULAR | Status: AC
  Administered 2018-02-16: 10 mg via INTRAVENOUS
  Filled 2018-02-16: qty 1

## 2018-02-16 MED ORDER — SODIUM CHLORIDE 0.9% FLUSH
10.0000 mL | INTRAVENOUS | Status: DC | PRN
  Administered 2018-02-16: 10 mL via INTRAVENOUS
  Filled 2018-02-16: qty 10

## 2018-02-16 NOTE — Progress Notes (Signed)
Patient states he is having problems sleeping and eating.  It is uncomfortable for food to go down.  Also has problems with liquids on occasion.  States he is having pain in right shoulder.

## 2018-02-17 ENCOUNTER — Inpatient Hospital Stay: Payer: Commercial Managed Care - PPO

## 2018-02-17 ENCOUNTER — Other Ambulatory Visit: Payer: Self-pay

## 2018-02-17 DIAGNOSIS — Z5111 Encounter for antineoplastic chemotherapy: Secondary | ICD-10-CM | POA: Diagnosis not present

## 2018-02-17 DIAGNOSIS — C8510 Unspecified B-cell lymphoma, unspecified site: Secondary | ICD-10-CM

## 2018-02-17 LAB — BASIC METABOLIC PANEL
Anion gap: 10 (ref 5–15)
BUN: 22 mg/dL — ABNORMAL HIGH (ref 6–20)
CO2: 24 mmol/L (ref 22–32)
Calcium: 9.2 mg/dL (ref 8.9–10.3)
Chloride: 103 mmol/L (ref 98–111)
Creatinine, Ser: 1.15 mg/dL (ref 0.61–1.24)
GFR calc Af Amer: >60 mL/min (ref >60)
GFR calc non Af Amer: >60 mL/min (ref >60)
Glucose, Bld: 139 mg/dL — ABNORMAL HIGH (ref 70–99)
Potassium: 4.2 mmol/L (ref 3.5–5.1)
Sodium: 137 mmol/L (ref 135–145)

## 2018-02-17 LAB — URIC ACID: Uric Acid, Serum: 4.3 mg/dL (ref 3.7–8.6)

## 2018-02-17 MED ORDER — PEGFILGRASTIM INJECTION 6 MG/0.6ML ~~LOC~~
6.0000 mg | PREFILLED_SYRINGE | Freq: Once | SUBCUTANEOUS | Status: AC
  Administered 2018-02-17: 6 mg via SUBCUTANEOUS

## 2018-02-17 NOTE — Anesthesia Postprocedure Evaluation (Signed)
Anesthesia Post Note  Patient: Ronnie Mcintyre  Procedure(s) Performed: INSERTION PORT-A-CATH (Right Chest)  Patient location during evaluation: PACU Anesthesia Type: General Level of consciousness: awake and alert Pain management: pain level controlled Vital Signs Assessment: post-procedure vital signs reviewed and stable Respiratory status: spontaneous breathing, nonlabored ventilation, respiratory function stable and patient connected to nasal cannula oxygen Cardiovascular status: blood pressure returned to baseline and stable Postop Assessment: no apparent nausea or vomiting Anesthetic complications: no     Last Vitals:  Vitals:   02/11/18 1218 02/11/18 1229  BP: 93/68 98/70  Pulse: (!) 108 (!) 110  Resp: (!) 21 20  Temp: 37.1 C (!) 36 C  SpO2: 95% 93%    Last Pain:  Vitals:   02/12/18 0908  TempSrc:   PainSc: 0-No pain                 Alphonsus Sias

## 2018-02-19 ENCOUNTER — Other Ambulatory Visit: Payer: Self-pay

## 2018-02-19 ENCOUNTER — Telehealth: Payer: Self-pay | Admitting: *Deleted

## 2018-02-19 ENCOUNTER — Inpatient Hospital Stay: Payer: Commercial Managed Care - PPO | Attending: Hematology and Oncology

## 2018-02-19 DIAGNOSIS — C8522 Mediastinal (thymic) large B-cell lymphoma, intrathoracic lymph nodes: Secondary | ICD-10-CM | POA: Diagnosis present

## 2018-02-19 DIAGNOSIS — C8510 Unspecified B-cell lymphoma, unspecified site: Secondary | ICD-10-CM

## 2018-02-19 DIAGNOSIS — Z5189 Encounter for other specified aftercare: Secondary | ICD-10-CM | POA: Insufficient documentation

## 2018-02-19 DIAGNOSIS — E876 Hypokalemia: Secondary | ICD-10-CM | POA: Insufficient documentation

## 2018-02-19 DIAGNOSIS — B029 Zoster without complications: Secondary | ICD-10-CM | POA: Diagnosis not present

## 2018-02-19 DIAGNOSIS — Z5111 Encounter for antineoplastic chemotherapy: Secondary | ICD-10-CM | POA: Diagnosis not present

## 2018-02-19 LAB — BASIC METABOLIC PANEL
Anion gap: 10 (ref 5–15)
BUN: 20 mg/dL (ref 6–20)
CO2: 27 mmol/L (ref 22–32)
Calcium: 9.3 mg/dL (ref 8.9–10.3)
Chloride: 100 mmol/L (ref 98–111)
Creatinine, Ser: 1.04 mg/dL (ref 0.61–1.24)
GFR calc Af Amer: >60 mL/min (ref >60)
GFR calc non Af Amer: >60 mL/min (ref >60)
Glucose, Bld: 138 mg/dL — ABNORMAL HIGH (ref 70–99)
Potassium: 3.6 mmol/L (ref 3.5–5.1)
Sodium: 137 mmol/L (ref 135–145)

## 2018-02-19 LAB — URIC ACID: Uric Acid, Serum: 4.3 mg/dL (ref 3.7–8.6)

## 2018-02-19 NOTE — Telephone Encounter (Signed)
Called patient to inform him that his labs look good.  Also inquired how he is feeling.  Patient states he is doing good.  He had a little nausea earlier today, but took his nausea medication and is feeling much better.

## 2018-02-19 NOTE — Telephone Encounter (Signed)
-----   Message from Lequita Asal, MD sent at 02/19/2018 12:30 PM EDT ----- Regarding: Please call patient  Let him know his labs look good.  How is he feeling?  M  ----- Message ----- From: Interface, Lab In [place] Sent: 02/19/2018  12:01 PM To: Lequita Asal, MD

## 2018-02-25 ENCOUNTER — Encounter: Payer: Self-pay | Admitting: Hematology and Oncology

## 2018-02-25 ENCOUNTER — Inpatient Hospital Stay (HOSPITAL_BASED_OUTPATIENT_CLINIC_OR_DEPARTMENT_OTHER): Payer: Commercial Managed Care - PPO | Admitting: Hematology and Oncology

## 2018-02-25 ENCOUNTER — Inpatient Hospital Stay: Payer: Commercial Managed Care - PPO

## 2018-02-25 VITALS — BP 122/88 | HR 85 | Temp 97.4°F | Resp 18 | Wt 254.1 lb

## 2018-02-25 DIAGNOSIS — E883 Tumor lysis syndrome: Secondary | ICD-10-CM

## 2018-02-25 DIAGNOSIS — C8510 Unspecified B-cell lymphoma, unspecified site: Secondary | ICD-10-CM

## 2018-02-25 DIAGNOSIS — Z5111 Encounter for antineoplastic chemotherapy: Secondary | ICD-10-CM | POA: Diagnosis not present

## 2018-02-25 LAB — CBC WITH DIFFERENTIAL/PLATELET
Basophils Absolute: 0 10*3/uL (ref 0–0.1)
Basophils Relative: 0 %
Eosinophils Absolute: 0 10*3/uL (ref 0–0.7)
Eosinophils Relative: 0 %
HCT: 44.9 % (ref 40.0–52.0)
Hemoglobin: 15.1 g/dL (ref 13.0–18.0)
Lymphocytes Relative: 8 %
Lymphs Abs: 0.6 10*3/uL — ABNORMAL LOW (ref 1.0–3.6)
MCH: 27.4 pg (ref 26.0–34.0)
MCHC: 33.6 g/dL (ref 32.0–36.0)
MCV: 81.6 fL (ref 80.0–100.0)
Monocytes Absolute: 0.7 10*3/uL (ref 0.2–1.0)
Monocytes Relative: 9 %
Neutro Abs: 6.2 10*3/uL (ref 1.4–6.5)
Neutrophils Relative %: 83 %
Platelets: 366 10*3/uL (ref 150–440)
RBC: 5.51 MIL/uL (ref 4.40–5.90)
RDW: 16.8 % — ABNORMAL HIGH (ref 11.5–14.5)
WBC: 7.5 10*3/uL (ref 3.8–10.6)

## 2018-02-25 LAB — COMPREHENSIVE METABOLIC PANEL
ALT: 20 U/L (ref 0–44)
AST: 18 U/L (ref 15–41)
Albumin: 3.8 g/dL (ref 3.5–5.0)
Alkaline Phosphatase: 82 U/L (ref 38–126)
Anion gap: 11 (ref 5–15)
BUN: 23 mg/dL — ABNORMAL HIGH (ref 6–20)
CO2: 25 mmol/L (ref 22–32)
Calcium: 9.3 mg/dL (ref 8.9–10.3)
Chloride: 100 mmol/L (ref 98–111)
Creatinine, Ser: 1.24 mg/dL (ref 0.61–1.24)
GFR calc Af Amer: >60 mL/min (ref >60)
GFR calc non Af Amer: >60 mL/min (ref >60)
Glucose, Bld: 144 mg/dL — ABNORMAL HIGH (ref 70–99)
Potassium: 4.3 mmol/L (ref 3.5–5.1)
Sodium: 136 mmol/L (ref 135–145)
Total Bilirubin: 0.7 mg/dL (ref 0.3–1.2)
Total Protein: 7 g/dL (ref 6.5–8.1)

## 2018-02-25 LAB — URIC ACID: Uric Acid, Serum: 4.8 mg/dL (ref 3.7–8.6)

## 2018-02-25 NOTE — Progress Notes (Signed)
Patient reports nausea Friday, Saturday and Sunday.  None since.  Having a good day today.  Still having problems swallowing.  Able to get things down but not easy.

## 2018-02-25 NOTE — Progress Notes (Signed)
[facility] day:  02/25/2018   Chief Complaint: Ronnie Mcintyre is a 52 y.o. male with a large B-cell lymphoma who is seen for nadir assessment after cycle #1 RCHOP chemotherapy.  HPI:  The patient was last seen in the medical oncology clinic on 02/16/2018.  At that time, he had ongoing chest discomfort.  He was slightly uncomfortable s/p port-a-cath placement.  He continued to lose weight.  Exam revealed no palpable adenopathy.  He had no facial plethora.  He began cycle #1 RCHOP.  He received Neulasta on 02/17/2018.  Tumor lysis labs were followed. Creatinine remained stable.  Uric acid was 4.3.  During the interim, he has done fairly well.  He describes nausea on Thursday that got worse on Friday and has improved since that time.  He took ondansetron.  He has taken prednisone daily since his last appointment.  He has been sleeping "ok" although notes only 5-6 hours of sleep at night.  He denies any neuropathy.  He denies any mouth sore or tenderness.  He denies constipation.  Chest pain is "almost non-existent".  He notes that is is a chore to eat and drink.  He eats 4-5 times/day.  He denies any dysphagia or odynophagia.  Taste sensation is unchanged.   Past Medical History:  Diagnosis Date  . Hypertension     Past Surgical History:  Procedure Laterality Date  . ANKLE SURGERY Right   . PORTACATH PLACEMENT Right 02/11/2018   Procedure: INSERTION PORT-A-CATH;  Surgeon: Jules Husbands, MD;  Location: [facility] ORS;  Service: General;  Laterality: Right;    Family History  Problem Relation Age of Onset  . Hypertension Mother   . Hypertension Father   . Multiple myeloma Father     Social History:  reports that he has never smoked. He has never used smokeless tobacco. He reports that he drinks alcohol. He reports that he does not use drugs.  He drinks a beer 1-2 x/week.  He works for [employer].  He is currently not working.  He lives  in [place].  His is alone today.  Allergies: No Known Allergies  Current Medications: Current Outpatient Medications  Medication Sig Dispense Refill  . acetaminophen (TYLENOL) 325 MG tablet Take 2 tablets (650 mg total) by mouth every 6 (six) hours as needed for mild pain (or Fever >/= 101).    [name] aspirin 81 MG tablet Take 81 mg by mouth daily.     . felodipine (PLENDIL) 5 MG 24 hr tablet Take 5 mg by mouth daily.    . fluticasone (FLONASE) 50 MCG/ACT nasal spray Place 2 sprays into both nostrils daily. 16 g 0  . hydrocortisone 1 % lotion Apply 1 application topically 2 (two) times daily as needed for itching.    . lidocaine-prilocaine (EMLA) cream Apply to affected area once 30 g 3  . loratadine (CLARITIN) 10 MG tablet Take 10 mg by mouth daily as needed for allergies.     [name] LORazepam (ATIVAN) 0.5 MG tablet Take 1 tablet (0.5 mg total) by mouth every 6 (six) hours as needed (Nausea or vomiting). 30 tablet 0  . losartan-hydrochlorothiazide (HYZAAR) 100-25 MG tablet Take 1 tablet by mouth daily.    . ondansetron (ZOFRAN) 8 MG tablet Take 1 tablet (8 mg total) by mouth 2 (two) times daily as needed. Start on day 3 after cyclophosphamide chemotherapy. 30 tablet 1  . oxyCODONE (OXY IR/ROXICODONE) 5 MG immediate release tablet  Take 1 tablet (5 mg total) by mouth every 6 (six) hours as needed for moderate pain. 30 tablet 0  . pantoprazole (PROTONIX) 40 MG tablet Take 1 tablet (40 mg total) by mouth daily. Use daily while on Prednisone for GI prophylaxis. 30 tablet 1  . potassium chloride SA (K-DUR,KLOR-CON) 20 MEQ tablet Take 1 tablet (20 mEq) daily x 3 days. Reserve additional tabs for use as directed by oncology. 6 tablet 0  . predniSONE (DELTASONE) 50 MG tablet Take 2 tablets (100 mg total) by mouth daily. Take on days 1-5 of chemotherapy. 60 tablet 1  . simvastatin (ZOCOR) 40 MG tablet Take 40 mg by mouth daily.    [name] allopurinol (ZYLOPRIM) 300 MG tablet Take 1 tablet (300 mg total) by mouth daily.  (Patient not taking: Reported on 02/11/2018) 90 tablet 1   No current facility-administered medications for this visit.     Review of Systems:  GENERAL:  Feels "ok".  No fevers, sweats.  Weight loss of 2 pounds. PERFORMANCE STATUS (ECOG):  1 HEENT:  No visual changes, runny nose, sore throat, mouth sores or tenderness. No dysphagia or odynophagia.  Chore to eat and drink. Lungs: No shortness of breath or cough.  No hemoptysis. Cardiac:  Chest discomfort "almost non-existent".  No chest pain, palpitations, orthopnea, or PND. GI:  No nausea, vomiting, diarrhea, constipation, melena or hematochezia. GU:  No urgency, frequency, dysuria, or hematuria. Musculoskeletal:  No back pain.  No joint pain.  No muscle tenderness. Extremities:  No pain or swelling. Skin:  No rashes or skin changes. Neuro:  No headache, numbness or weakness, balance or coordination issues. Endocrine:  No diabetes, thyroid issues, hot flashes or night sweats. Psych:  No mood changes, depression or anxiety.  Sleeping 5-6 hours/night. Pain:  No focal pain. Review of systems:  All other systems reviewed and found to be negative.    Physical Exam: Blood pressure 122/88, pulse 85, temperature (!) 97.4 F (36.3 C), temperature source Tympanic, resp. rate 18, weight 254 lb 1 oz (115.2 kg). GENERAL:  Well developed, well nourished, gentleman sitting comfortably in the exam room in no acute distress. MENTAL STATUS:  Alert and oriented to person, place and time. HEAD:  Short dark hair with goatee.  Normocephalic, atraumatic, face symmetric, no Cushingoid features. EYES:  Blue eyes.  Pupils equal round and reactive to light and accomodation.  No conjunctivitis or scleral icterus. ENT:  Oropharynx clear without lesion.  Tongue normal. Mucous membranes moist.  RESPIRATORY:  Clear to auscultation without rales, wheezes or rhonchi. CARDIOVASCULAR:  Regular rate and rhythm without murmur, rub or gallop. ABDOMEN:  Soft, non-tender,  with active bowel sounds, and no hepatosplenomegaly.  No masses. SKIN:  No rashes, ulcers or lesions. EXTREMITIES: No edema, no skin discoloration or tenderness.  No palpable cords. LYMPH NODES: No palpable cervical, supraclavicular, axillary or inguinal adenopathy  NEUROLOGICAL: Unremarkable. PSYCH:  Appropriate.    Appointment on 02/25/2018  Component Date Value Ref Range Status  . Uric Acid, Serum 02/25/2018 4.8  3.7 - 8.6 mg/dL Final   Performed at [facility], [address] [place], [place] [address]  . Sodium 02/25/2018 136  135 - 145 mmol/L Final  . Potassium 02/25/2018 4.3  3.5 - 5.1 mmol/L Final  . Chloride 02/25/2018 100  98 - 111 mmol/L Final  . CO2 02/25/2018 25  22 - 32 mmol/L Final  . Glucose, Bld 02/25/2018 144* 70 - 99 mg/dL Final  . BUN 02/25/2018 23* 6 -  20 mg/dL Final  . Creatinine, Ser 02/25/2018 1.24  0.61 - 1.24 mg/dL Final  . Calcium 02/25/2018 9.3  8.9 - 10.3 mg/dL Final  . Total Protein 02/25/2018 7.0  6.5 - 8.1 g/dL Final  . Albumin 02/25/2018 3.8  3.5 - 5.0 g/dL Final  . AST 02/25/2018 18  15 - 41 U/L Final  . ALT 02/25/2018 20  0 - 44 U/L Final  . Alkaline Phosphatase 02/25/2018 82  38 - 126 U/L Final  . Total Bilirubin 02/25/2018 0.7  0.3 - 1.2 mg/dL Final  . GFR calc non Af Amer 02/25/2018 >60  >60 mL/min Final  . GFR calc Af Amer 02/25/2018 >60  >60 mL/min Final   Comment: (NOTE) The eGFR has been calculated using the CKD EPI equation. This calculation has not been validated in all clinical situations. eGFR's persistently <60 mL/min signify possible Chronic Kidney Disease.   Georgiann Hahn gap 02/25/2018 11  5 - 15 Final   Performed at [facility], [address] [place], [place] [address]  . WBC 02/25/2018 7.5  3.8 - 10.6 K/uL Final  . RBC 02/25/2018 5.51  4.40 - 5.90 MIL/uL Final  . Hemoglobin 02/25/2018 15.1  13.0 - 18.0 g/dL Final  . HCT 02/25/2018 44.9  40.0 - 52.0 % Final  . MCV 02/25/2018 81.6  80.0 - 100.0 fL Final  . MCH  02/25/2018 27.4  26.0 - 34.0 pg Final  . MCHC 02/25/2018 33.6  32.0 - 36.0 g/dL Final  . RDW 02/25/2018 16.8* 11.5 - 14.5 % Final  . Platelets 02/25/2018 366  150 - 440 K/uL Final  . Neutrophils Relative % 02/25/2018 83  % Final  . Neutro Abs 02/25/2018 6.2  1.4 - 6.5 K/uL Final  . Lymphocytes Relative 02/25/2018 8  % Final  . Lymphs Abs 02/25/2018 0.6* 1.0 - 3.6 K/uL Final  . Monocytes Relative 02/25/2018 9  % Final  . Monocytes Absolute 02/25/2018 0.7  0.2 - 1.0 K/uL Final  . Eosinophils Relative 02/25/2018 0  % Final  . Eosinophils Absolute 02/25/2018 0.0  0 - 0.7 K/uL Final  . Basophils Relative 02/25/2018 0  % Final  . Basophils Absolute 02/25/2018 0.0  0 - 0.1 K/uL Final   Performed at [facility], [address] [place], [place] [address]    Assessment:  Ronnie Mcintyre is a 52 y.o. 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  [facility] pathology revealed necrotic tissue. [facility] consultation revealed neoplastic cells are positive for CD20, BCL-6, MUM-1, and BCL-2. The neoplastic cells were negative for CD30, CD3, , CD10, c-MYC, EBV ISH, AE1/3, and PAX-8. Ki67 is 60-70%. 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 The absence of CD30 expression makes primary mediastinal large B-cell lymphoma less likely. FISH for MYC rearrangement was negative. IPI score, age adjusted IPI, are low (1) and NCCN IPI score is low-intermediate (2).  He presented with upper chest fullness with radiation to his neck and arm. He has had B symptoms (sweats and weight loss) worrisome for lymphoma. LDHis 273. Uric acid was normal.  Beta-HCG and AFP were normal on 01/16/2018.  Chest CT angiogramon 01/15/2018 revealed an 11.3 x 8.0 cm anterior mediastinal mass.  Abdomen and pelvic CT on 01/16/2018 revealed a 6.3 x 5.6 cm soft tissue mass or medium density fluid filling the urinary bladder.  There was a 1.5  cm mildly complex lower pole  right renal cyst. There was a 1.9 cm indeterminate left adrenal mass.  There was a small pericardial effusion.  Renal ultrasound on 01/25/2018 revealed no evidence of bladder mass.  Finding on prior CT exam likely represented excretion of residual contrast material from a CTA chest exam performed 1 day prior   PET scan on 01/26/2018 revealed an 11.1 cm intensely hypermetabolic and partially necrotic right anterior mediastinal mass, compatible with malignancy. There was diffuse marrow hypermetabolism, nonspecific, cannot exclude an infiltrative neoplastic marrow process. There was no focal skeletal hypermetabolism. There was no focal bone lesions on the CT images.  There was nonspecific mild heterogeneous prostatic hypermetabolism.  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    Bone marrow biopsy on 02/03/2018 revealed a slightly hypercellular bone marrow for age with trilineage hematopoiesis.  There was no monoclonal B-cell population or abnormal T-cell phenotype identified.  Cytogenetics were normal (46, XY).  Echo on 01/15/2018 revealed an EF of 55-60%.  Hepatitis B, hepatitis C, and HIV testing was negative on 02/10/2018.  G6PD assay was normal.  He is day 10 s/p cycle #1 RCHOP chemotherapy (02/16/2018) with Neulasta support.  Symptomatically, he denies any B symptoms.  Chest discomfort is "about non-existent".  He has some issues with swallowing.  He has continued his steroids.  Plan: 1. Labs today:  CBC with diff, BMP, uric acid. 2. Diffuse large B cell lymphoma:  Day 10 s/p chemotherapy.  Counts are good with Neulasta support.  Symptoms associated with lymphoma are fading.  Discuss discontinuation of steroids (patient inadvertently took past 5 days). 3. Tumor lysis syndrome:  Continue allopurinol.  Continue good hydration. 4. RTC on 03/09/2018 for MD assessment, labs (CBC with diff, CMP, Mg, uric  acid), and cycle #2 RCHOP chemotherapy.   Lequita Asal, MD  02/25/2018, 4:27 PM

## 2018-03-08 ENCOUNTER — Other Ambulatory Visit: Payer: Self-pay | Admitting: *Deleted

## 2018-03-08 DIAGNOSIS — C8510 Unspecified B-cell lymphoma, unspecified site: Secondary | ICD-10-CM

## 2018-03-09 ENCOUNTER — Encounter: Payer: Self-pay | Admitting: Hematology and Oncology

## 2018-03-09 ENCOUNTER — Inpatient Hospital Stay: Payer: Commercial Managed Care - PPO

## 2018-03-09 ENCOUNTER — Other Ambulatory Visit: Payer: Self-pay

## 2018-03-09 ENCOUNTER — Inpatient Hospital Stay (HOSPITAL_BASED_OUTPATIENT_CLINIC_OR_DEPARTMENT_OTHER): Payer: Commercial Managed Care - PPO | Admitting: Hematology and Oncology

## 2018-03-09 VITALS — BP 124/83 | HR 89 | Temp 96.1°F | Resp 18 | Wt 252.1 lb

## 2018-03-09 DIAGNOSIS — G629 Polyneuropathy, unspecified: Secondary | ICD-10-CM | POA: Diagnosis not present

## 2018-03-09 DIAGNOSIS — T451X5A Adverse effect of antineoplastic and immunosuppressive drugs, initial encounter: Secondary | ICD-10-CM

## 2018-03-09 DIAGNOSIS — Z5111 Encounter for antineoplastic chemotherapy: Secondary | ICD-10-CM

## 2018-03-09 DIAGNOSIS — E876 Hypokalemia: Secondary | ICD-10-CM

## 2018-03-09 DIAGNOSIS — C8510 Unspecified B-cell lymphoma, unspecified site: Secondary | ICD-10-CM

## 2018-03-09 DIAGNOSIS — C8522 Mediastinal (thymic) large B-cell lymphoma, intrathoracic lymph nodes: Secondary | ICD-10-CM

## 2018-03-09 DIAGNOSIS — E883 Tumor lysis syndrome: Secondary | ICD-10-CM

## 2018-03-09 DIAGNOSIS — I10 Essential (primary) hypertension: Secondary | ICD-10-CM

## 2018-03-09 DIAGNOSIS — Z807 Family history of other malignant neoplasms of lymphoid, hematopoietic and related tissues: Secondary | ICD-10-CM

## 2018-03-09 DIAGNOSIS — G620 Drug-induced polyneuropathy: Secondary | ICD-10-CM

## 2018-03-09 LAB — CBC WITH DIFFERENTIAL/PLATELET
Basophils Absolute: 0.1 10*3/uL (ref 0–0.1)
Basophils Relative: 1 %
Eosinophils Absolute: 0.1 10*3/uL (ref 0–0.7)
Eosinophils Relative: 1 %
HCT: 40.2 % (ref 40.0–52.0)
Hemoglobin: 13.9 g/dL (ref 13.0–18.0)
Lymphocytes Relative: 9 %
Lymphs Abs: 1.1 10*3/uL (ref 1.0–3.6)
MCH: 28.1 pg (ref 26.0–34.0)
MCHC: 34.5 g/dL (ref 32.0–36.0)
MCV: 81.4 fL (ref 80.0–100.0)
Monocytes Absolute: 1 10*3/uL (ref 0.2–1.0)
Monocytes Relative: 8 %
Neutro Abs: 10.5 10*3/uL — ABNORMAL HIGH (ref 1.4–6.5)
Neutrophils Relative %: 81 %
Platelets: 265 10*3/uL (ref 150–440)
RBC: 4.94 MIL/uL (ref 4.40–5.90)
RDW: 18.6 % — ABNORMAL HIGH (ref 11.5–14.5)
WBC: 12.8 10*3/uL — ABNORMAL HIGH (ref 3.8–10.6)

## 2018-03-09 LAB — LACTATE DEHYDROGENASE: LDH: 171 U/L (ref 98–192)

## 2018-03-09 LAB — COMPREHENSIVE METABOLIC PANEL
ALT: 31 U/L (ref 0–44)
AST: 21 U/L (ref 15–41)
Albumin: 3.9 g/dL (ref 3.5–5.0)
Alkaline Phosphatase: 76 U/L (ref 38–126)
Anion gap: 9 (ref 5–15)
BUN: 19 mg/dL (ref 6–20)
CO2: 27 mmol/L (ref 22–32)
Calcium: 9.5 mg/dL (ref 8.9–10.3)
Chloride: 101 mmol/L (ref 98–111)
Creatinine, Ser: 1.06 mg/dL (ref 0.61–1.24)
GFR calc Af Amer: >60 mL/min (ref >60)
GFR calc non Af Amer: >60 mL/min (ref >60)
Glucose, Bld: 120 mg/dL — ABNORMAL HIGH (ref 70–99)
Potassium: 3.2 mmol/L — ABNORMAL LOW (ref 3.5–5.1)
Sodium: 137 mmol/L (ref 135–145)
Total Bilirubin: 1.2 mg/dL (ref 0.3–1.2)
Total Protein: 7.1 g/dL (ref 6.5–8.1)

## 2018-03-09 LAB — MAGNESIUM: Magnesium: 2 mg/dL (ref 1.7–2.4)

## 2018-03-09 LAB — URIC ACID: Uric Acid, Serum: 5.7 mg/dL (ref 3.7–8.6)

## 2018-03-09 MED ORDER — SODIUM CHLORIDE 0.9 % IV SOLN: 10.0000 mg | Freq: Once | INTRAVENOUS | Status: DC

## 2018-03-09 MED ORDER — DEXAMETHASONE SODIUM PHOSPHATE 10 MG/ML IJ SOLN
10.0000 mg | Freq: Once | INTRAMUSCULAR | Status: AC
  Administered 2018-03-09: 10 mg via INTRAVENOUS
  Filled 2018-03-09: qty 1

## 2018-03-09 MED ORDER — SODIUM CHLORIDE 0.9% FLUSH
10.0000 mL | INTRAVENOUS | Status: DC | PRN
  Administered 2018-03-09: 10 mL
  Filled 2018-03-09: qty 10

## 2018-03-09 MED ORDER — SODIUM CHLORIDE 0.9 % IV SOLN
Freq: Once | INTRAVENOUS | Status: AC
  Administered 2018-03-09: 10:00:00 via INTRAVENOUS
  Filled 2018-03-09: qty 10

## 2018-03-09 MED ORDER — VINCRISTINE SULFATE CHEMO INJECTION 1 MG/ML
2.0000 mg | Freq: Once | INTRAVENOUS | Status: AC
  Administered 2018-03-09: 2 mg via INTRAVENOUS
  Filled 2018-03-09: qty 2

## 2018-03-09 MED ORDER — SODIUM CHLORIDE 0.9 % IV SOLN
Freq: Once | INTRAVENOUS | Status: AC
  Administered 2018-03-09: 10:00:00 via INTRAVENOUS
  Filled 2018-03-09: qty 1000

## 2018-03-09 MED ORDER — DIPHENHYDRAMINE HCL 25 MG PO CAPS
50.0000 mg | ORAL_CAPSULE | Freq: Once | ORAL | Status: AC
  Administered 2018-03-09: 50 mg via ORAL
  Filled 2018-03-09: qty 2

## 2018-03-09 MED ORDER — PALONOSETRON HCL INJECTION 0.25 MG/5ML
0.2500 mg | Freq: Once | INTRAVENOUS | Status: AC
  Administered 2018-03-09: 0.25 mg via INTRAVENOUS
  Filled 2018-03-09: qty 5

## 2018-03-09 MED ORDER — SODIUM CHLORIDE 0.9 % IV SOLN: 375.0000 mg/m2 | Freq: Once | INTRAVENOUS | Status: DC

## 2018-03-09 MED ORDER — DOXORUBICIN HCL CHEMO IV INJECTION 2 MG/ML
50.0000 mg/m2 | Freq: Once | INTRAVENOUS | Status: AC
  Administered 2018-03-09: 122 mg via INTRAVENOUS
  Filled 2018-03-09: qty 61

## 2018-03-09 MED ORDER — SODIUM CHLORIDE 0.9 % IV SOLN
750.0000 mg/m2 | Freq: Once | INTRAVENOUS | Status: AC
  Administered 2018-03-09: 1820 mg via INTRAVENOUS
  Filled 2018-03-09: qty 91

## 2018-03-09 MED ORDER — HEPARIN SOD (PORK) LOCK FLUSH 100 UNIT/ML IV SOLN
500.0000 [IU] | Freq: Once | INTRAVENOUS | Status: AC | PRN
  Administered 2018-03-09: 500 [IU]
  Filled 2018-03-09: qty 5

## 2018-03-09 MED ORDER — ACETAMINOPHEN 325 MG PO TABS
650.0000 mg | ORAL_TABLET | Freq: Once | ORAL | Status: AC
  Administered 2018-03-09: 650 mg via ORAL
  Filled 2018-03-09: qty 2

## 2018-03-09 MED ORDER — SODIUM CHLORIDE 0.9 % IV SOLN
375.0000 mg/m2 | Freq: Once | INTRAVENOUS | Status: AC
  Administered 2018-03-09: 900 mg via INTRAVENOUS
  Filled 2018-03-09: qty 50

## 2018-03-09 MED ORDER — POTASSIUM CHLORIDE CRYS ER 20 MEQ PO TBCR: EXTENDED_RELEASE_TABLET | ORAL | 0 refills | Status: DC

## 2018-03-09 NOTE — Progress Notes (Signed)
Here for follow up. Overall doing well-lots of support networks he stated.

## 2018-03-09 NOTE — Progress Notes (Signed)
[facility] day:  03/09/2018   Chief Complaint: Ronnie Mcintyre is a 52 y.o. male with a large B-cell lymphoma who is seen for assessment prior to cycle #2 RCHOP chemotherapy.  HPI:  The patient was last seen in the medical oncology clinic on 02/25/2018.  At that time, he was seen for nadir visit.  Chest discomfort was non-existent.  He was noted to still be taking his steroids.  Prednisone was discontinued.  CBC included a hematocrit of 44.9, hemoglobin 15.1, MCV 81.6, platelets 366,000, WBC 7500 with an ANC of 6200.  Creatinine was 1.24.  Uric acid was 4.8  During the interim, patient has been doing well. He notes that he feels generally well. Patient has very mild tingling to the tips of his fingers. Patient has no nausea, vomiting, and changes to his bowel habits. Patient denies that he has experienced any B symptoms. He denies any interval infections.   Patient advises that he maintains an adequate appetite. He is eating well. Weight today is 252 lb 1.6 oz (114.4 kg), which compared to his last visit to the clinic, represents a down 2 pounds.     Patient denies pain in the clinic today.   Past Medical History:  Diagnosis Date  . Hypertension     Past Surgical History:  Procedure Laterality Date  . ANKLE SURGERY Right   . PORTACATH PLACEMENT Right 02/11/2018   Procedure: INSERTION PORT-A-CATH;  Surgeon: Jules Husbands, MD;  Location: [facility] ORS;  Service: General;  Laterality: Right;    Family History  Problem Relation Age of Onset  . Hypertension Mother   . Hypertension Father   . Multiple myeloma Father     Social History:  reports that he has never smoked. He has never used smokeless tobacco. He reports that he drinks alcohol. He reports that he does not use drugs.  He drinks a beer 1-2 x/week.  He works for [employer].  He lives in [place].  His is alone today.  Allergies: No Known Allergies  Current  Medications: Current Outpatient Medications  Medication Sig Dispense Refill  . allopurinol (ZYLOPRIM) 300 MG tablet Take 1 tablet (300 mg total) by mouth daily. 90 tablet 1  . aspirin 81 MG tablet Take 81 mg by mouth daily.     . felodipine (PLENDIL) 5 MG 24 hr tablet Take 5 mg by mouth daily.    [name] lidocaine-prilocaine (EMLA) cream Apply to affected area once 30 g 3  . losartan-hydrochlorothiazide (HYZAAR) 100-25 MG tablet Take 1 tablet by mouth daily.    . pantoprazole (PROTONIX) 40 MG tablet Take 1 tablet (40 mg total) by mouth daily. Use daily while on Prednisone for GI prophylaxis. 30 tablet 1  . predniSONE (DELTASONE) 50 MG tablet Take 2 tablets (100 mg total) by mouth daily. Take on days 1-5 of chemotherapy. 60 tablet 1  . simvastatin (ZOCOR) 40 MG tablet Take 40 mg by mouth daily.    [name] acetaminophen (TYLENOL) 325 MG tablet Take 2 tablets (650 mg total) by mouth every 6 (six) hours as needed for mild pain (or Fever >/= 101). (Patient not taking: Reported on 03/09/2018)    . fluticasone (FLONASE) 50 MCG/ACT nasal spray Place 2 sprays into both nostrils daily. (Patient not taking: Reported on 03/09/2018) 16 g 0  . hydrocortisone 1 % lotion Apply 1 application topically 2 (two) times daily as needed for itching.    . loratadine (CLARITIN) 10 MG  tablet Take 10 mg by mouth daily as needed for allergies.     [name] LORazepam (ATIVAN) 0.5 MG tablet Take 1 tablet (0.5 mg total) by mouth every 6 (six) hours as needed (Nausea or vomiting). (Patient not taking: Reported on 03/09/2018) 30 tablet 0  . ondansetron (ZOFRAN) 8 MG tablet Take 1 tablet (8 mg total) by mouth 2 (two) times daily as needed. Start on day 3 after cyclophosphamide chemotherapy. (Patient not taking: Reported on 03/09/2018) 30 tablet 1  . oxyCODONE (OXY IR/ROXICODONE) 5 MG immediate release tablet Take 1 tablet (5 mg total) by mouth every 6 (six) hours as needed for moderate pain. (Patient not taking: Reported on 03/09/2018) 30 tablet 0  .  potassium chloride SA (K-DUR,KLOR-CON) 20 MEQ tablet Take 1 tablet (20 mEq) daily x 3 days. Reserve additional tabs for use as directed by oncology. 6 tablet 0   No current facility-administered medications for this visit.     Review of Systems  Constitutional: Positive for weight loss (2 pounds). Negative for diaphoresis, fever and malaise/fatigue.       Feels good.  HENT: Negative.  Negative for congestion, ear discharge, ear pain, hearing loss, nosebleeds, sinus pain, sore throat and tinnitus.   Eyes: Negative.  Negative for blurred vision, double vision, photophobia, pain, discharge and redness.  Respiratory: Negative.  Negative for hemoptysis, sputum production and shortness of breath.   Cardiovascular: Negative for chest pain, palpitations, orthopnea, claudication, leg swelling and PND.       No chest discomfort.  Gastrointestinal: Negative.  Negative for abdominal pain, blood in stool, constipation, diarrhea, melena, nausea and vomiting.  Genitourinary: Negative.  Negative for dysuria, frequency and hematuria.  Musculoskeletal: Negative.  Negative for back pain, joint pain, myalgias and neck pain.  Skin: Negative.  Negative for itching and rash.  Neurological: Positive for sensory change (little tingling to tips of fingers). Negative for dizziness, tremors, speech change, focal weakness, weakness and headaches.  Endo/Heme/Allergies: Negative.   Psychiatric/Behavioral: Negative for depression, memory loss and suicidal ideas. The patient has insomnia. The patient is not nervous/anxious.   All other systems reviewed and are negative.  Performance status (ECOG): 1  Vital Signs BP 124/83 (BP Location: Left Arm, Patient Position: Sitting)   Pulse 89   Temp (!) 96.1 F (35.6 C) (Tympanic)   Resp 18   Wt 252 lb 1.6 oz (114.4 kg)   BMI 34.19 kg/m   Physical Exam  Constitutional: He is oriented to person, place, and time and well-developed, well-nourished, and in no distress.  HENT:   Head: Normocephalic and atraumatic.  Short graying hair  Eyes: Pupils are equal, round, and reactive to light. EOM are normal. No scleral icterus.  Blue eyes  Neck: Normal range of motion. Neck supple. No tracheal deviation present. No thyromegaly present.  Cardiovascular: Normal rate, regular rhythm and normal heart sounds. Exam reveals no gallop and no friction rub.  No murmur heard. Pulmonary/Chest: Effort normal and breath sounds normal. No respiratory distress. He has no wheezes. He has no rales.  Abdominal: Soft. Bowel sounds are normal. He exhibits no distension. There is no tenderness.  Musculoskeletal: Normal range of motion. He exhibits no edema or tenderness.  Lymphadenopathy:    He has no cervical adenopathy.    He has no axillary adenopathy.       Right: No inguinal and no supraclavicular adenopathy present.       Left: No inguinal and no supraclavicular adenopathy present.  Neurological: He is alert and  oriented to person, place, and time.  Able to walk on heels (no foot drop).  Skin: Skin is warm and dry. No rash noted. No erythema.  Psychiatric: Mood, affect and judgment normal.  Nursing note and vitals reviewed.   Infusion on 03/09/2018  Component Date Value Ref Range Status  . LDH 03/09/2018 171  98 - 192 U/L Final   Performed at [facility], [address] [place], [place] [address]  . Magnesium 03/09/2018 2.0  1.7 - 2.4 mg/dL Final   Performed at [facility], [address], [place], [place] [address]  . Sodium 03/09/2018 137  135 - 145 mmol/L Final  . Potassium 03/09/2018 3.2* 3.5 - 5.1 mmol/L Final  . Chloride 03/09/2018 101  98 - 111 mmol/L Final  . CO2 03/09/2018 27  22 - 32 mmol/L Final  . Glucose, Bld 03/09/2018 120* 70 - 99 mg/dL Final  . BUN 03/09/2018 19  6 - 20 mg/dL Final  . Creatinine, Ser 03/09/2018 1.06  0.61 - 1.24 mg/dL Final  . Calcium 03/09/2018 9.5  8.9 - 10.3 mg/dL Final  . Total Protein 03/09/2018 7.1  6.5 - 8.1 g/dL  Final  . Albumin 03/09/2018 3.9  3.5 - 5.0 g/dL Final  . AST 03/09/2018 21  15 - 41 U/L Final  . ALT 03/09/2018 31  0 - 44 U/L Final  . Alkaline Phosphatase 03/09/2018 76  38 - 126 U/L Final  . Total Bilirubin 03/09/2018 1.2  0.3 - 1.2 mg/dL Final  . GFR calc non Af Amer 03/09/2018 >60  >60 mL/min Final  . GFR calc Af Amer 03/09/2018 >60  >60 mL/min Final   Comment: (NOTE) The eGFR has been calculated using the CKD EPI equation. This calculation has not been validated in all clinical situations. eGFR's persistently <60 mL/min signify possible Chronic Kidney Disease.   Georgiann Hahn gap 03/09/2018 9  5 - 15 Final   Performed at [facility], [address] [place], [place] [address]  . WBC 03/09/2018 12.8* 3.8 - 10.6 K/uL Final  . RBC 03/09/2018 4.94  4.40 - 5.90 MIL/uL Final  . Hemoglobin 03/09/2018 13.9  13.0 - 18.0 g/dL Final  . HCT 03/09/2018 40.2  40.0 - 52.0 % Final  . MCV 03/09/2018 81.4  80.0 - 100.0 fL Final  . MCH 03/09/2018 28.1  26.0 - 34.0 pg Final  . MCHC 03/09/2018 34.5  32.0 - 36.0 g/dL Final  . RDW 03/09/2018 18.6* 11.5 - 14.5 % Final  . Platelets 03/09/2018 265  150 - 440 K/uL Final  . Neutrophils Relative % 03/09/2018 81  % Final  . Neutro Abs 03/09/2018 10.5* 1.4 - 6.5 K/uL Final  . Lymphocytes Relative 03/09/2018 9  % Final  . Lymphs Abs 03/09/2018 1.1  1.0 - 3.6 K/uL Final  . Monocytes Relative 03/09/2018 8  % Final  . Monocytes Absolute 03/09/2018 1.0  0.2 - 1.0 K/uL Final  . Eosinophils Relative 03/09/2018 1  % Final  . Eosinophils Absolute 03/09/2018 0.1  0 - 0.7 K/uL Final  . Basophils Relative 03/09/2018 1  % Final  . Basophils Absolute 03/09/2018 0.1  0 - 0.1 K/uL Final   Performed at [facility], [address] [place], [place] [address]    Assessment:  OSMANY AZER is a 52 y.o. 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  [facility] pathology revealed necrotic tissue. [facility] consultation revealed  neoplastic cells  are positive for CD20, BCL-6, MUM-1, and BCL-2. The neoplastic cells were negative for CD30, CD3, , CD10, c-MYC, EBV ISH, AE1/3, and PAX-8. Ki67 is 60-70%. 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 The absence of CD30 expression makes primary mediastinal large B-cell lymphoma less likely. FISH for MYC rearrangement was negative. IPI score, age adjusted IPI, are low (1) and NCCN IPI score is low-intermediate (2).  He presented with upper chest fullness with radiation to his neck and arm. He has had B symptoms (sweats and weight loss) worrisome for lymphoma. LDHis 273. Uric acid was normal.  Beta-HCG and AFP were normal on 01/16/2018.  Chest CT angiogramon 01/15/2018 revealed an 11.3 x 8.0 cm anterior mediastinal mass.  Abdomen and pelvic CT on 01/16/2018 revealed a 6.3 x 5.6 cm soft tissue mass or medium density fluid filling the urinary bladder.  There was a 1.5 cm mildly complex lower pole right renal cyst. There was a 1.9 cm indeterminate left adrenal mass.  There was a small pericardial effusion.  Renal ultrasound on 01/25/2018 revealed no evidence of bladder mass.  Finding on prior CT exam likely represented excretion of residual contrast material from a CTA chest exam performed 1 day prior   PET scan on 01/26/2018 revealed an 11.1 cm intensely hypermetabolic and partially necrotic right anterior mediastinal mass, compatible with malignancy. There was diffuse marrow hypermetabolism, nonspecific, cannot exclude an infiltrative neoplastic marrow process. There was no focal skeletal hypermetabolism. There was no focal bone lesions on the CT images.  There was nonspecific mild heterogeneous prostatic hypermetabolism.  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    Bone marrow biopsy on 02/03/2018 revealed a slightly hypercellular bone marrow for age with trilineage  hematopoiesis.  There was no monoclonal B-cell population or abnormal T-cell phenotype identified.  Cytogenetics were normal (46, XY).  Echo on 01/15/2018 revealed an EF of 55-60%.  Hepatitis B, hepatitis C, and HIV testing was negative on 02/10/2018.  G6PD assay was normal.  He s/p cycle #1 RCHOP (02/16/2018) with Neulasta support.  Symptomatically, he is doing well. He denies any B symptoms.  Chest discomfort has resolved. He has a little "tingling" in the tips of his fingers.  Potassium is 3.2.  Plan: 1. Labs today:  CBC with diff, CMP, Mg, uric acid. 2. 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 Tolerating treatments with minimal side effects.  Labs reviewed. Blood counts stable and adequate enough for treatment. Will proceed with cycle #2 RCHOP  Reviewed with patient that prednisone should ONLY be used for 5 days.  Discuss daily loratadine to mitigate Neulasta induced bone pain.  Discuss symptom management.  Patient has antiemetics and pain medications at home to use on a PRN basis. Patient  advising that the  prescribed interventions are adequate at this point. Continue all medications as previously prescribed.  3. HYPOkalemia - acute  Discuss low potassium. Potassium level 3.2 today. Will replace with 20 mEq IV today. Prescription will be sent in for 20 mEq oral potassium supplement (K-Dur) to be taken daily x 3 days.  4. Neuropathy - acute  Very mild tingling to tips of fingers. Not affecting function.   Will continue to monitor for progression.  5. Tumor lysis syndrome - stable  Labs reviewed. TLS labs normal.   Continue prophylactic allopurinol dose.  Encouraged increased fluid intake.  6. RTC tomorrow for Neulasta. 7.  RTC in 3 weeks for MD assessment, labs (CBC with diff, CMP, Mg, uric acid), and cycle #3 RCHOP chemotherapy.   Honor Loh, NP  03/09/2018, 9:10 AM  I saw and evaluated the patient, participating in the key portions of the  service and reviewing pertinent diagnostic studies and records.  I reviewed the nurse practitioner's note and agree with the findings and the plan.  The assessment and plan were discussed with the patient.  Multiple questions were asked by the patient and answered.   Nolon Stalls, MD 03/09/2018,9:10 AM

## 2018-03-10 ENCOUNTER — Inpatient Hospital Stay: Payer: Commercial Managed Care - PPO

## 2018-03-10 DIAGNOSIS — C8510 Unspecified B-cell lymphoma, unspecified site: Secondary | ICD-10-CM

## 2018-03-10 DIAGNOSIS — Z5111 Encounter for antineoplastic chemotherapy: Secondary | ICD-10-CM | POA: Diagnosis not present

## 2018-03-10 MED ORDER — PEGFILGRASTIM INJECTION 6 MG/0.6ML ~~LOC~~
6.0000 mg | PREFILLED_SYRINGE | Freq: Once | SUBCUTANEOUS | Status: AC
  Administered 2018-03-10: 6 mg via SUBCUTANEOUS
  Filled 2018-03-10: qty 0.6

## 2018-03-18 ENCOUNTER — Encounter: Payer: Self-pay | Admitting: Hematology and Oncology

## 2018-03-19 ENCOUNTER — Inpatient Hospital Stay (HOSPITAL_BASED_OUTPATIENT_CLINIC_OR_DEPARTMENT_OTHER): Payer: Commercial Managed Care - PPO | Admitting: Oncology

## 2018-03-19 VITALS — BP 118/79 | HR 20 | Temp 97.3°F | Resp 18 | Wt 257.1 lb

## 2018-03-19 DIAGNOSIS — B029 Zoster without complications: Secondary | ICD-10-CM

## 2018-03-19 DIAGNOSIS — Z5111 Encounter for antineoplastic chemotherapy: Secondary | ICD-10-CM | POA: Diagnosis not present

## 2018-03-19 DIAGNOSIS — C8510 Unspecified B-cell lymphoma, unspecified site: Secondary | ICD-10-CM

## 2018-03-19 MED ORDER — VALACYCLOVIR HCL 1 G PO TABS: 1000.0000 mg | ORAL_TABLET | Freq: Three times a day (TID) | ORAL | 0 refills | Status: DC

## 2018-03-19 NOTE — Progress Notes (Signed)
Symptom Management Consult note [facility]  [phone] [phone]  Patient Care Team: Ronnie Kayser, MD as PCP - General (Internal Medicine)   Name of the patient: Ronnie Mcintyre  [id number]  25-Jan-1966   Date of visit: 03/19/18  Diagnosis-B-cell lymphoma  Chief complaint/ Reason for visit-rash  Heme/Onc history: Patient was last seen by Dr. Mike Mcintyre primary medical oncologist on 03/09/2018 where he was doing well.  Complained of mild tingling in the tips of his fingers, maintain an adequate appetite, denied any B symptoms, denies any infections and denied any pain.  He received cycle 2 of R-CHOP and 20 mEq of potassium for hypokalemia.  Potassium level 3.2.  He was given a prescription for 20 mEq of oral potassium for 3 days.  They discussed prophylactic allopurinol for TLS.  She is encouraged to increase fluid intake.  He was scheduled to return to clinic on 03/30/2018 for labs, assessment and cycle 3 of R-CHOP. Oncology History   Ronnie Mcintyre is a 52 y.o. 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  [facility] pathology revealed necrotic tissue. [facility] consultation revealed neoplastic cells are positive for CD20, BCL-6, MUM-1, and BCL-2. The neoplastic cells were negative for CD30, CD3, , CD10, c-MYC, EBV ISH, AE1/3, and PAX-8. Ki67 is 60-70%. 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 The absence of CD30 expression makes primary mediastinal large B-cell lymphoma less likely. FISH for MYC rearrangement was negative. IPI score, age adjusted IPI, are low (1) and NCCN IPI score is low-intermediate (2).  He presented with upper chest fullness with radiation to his neck and arm. He has had B symptoms (sweats and weight loss) worrisome for lymphoma. LDHis 273. Uric acid was normal.  Beta-HCG and AFP were normal on 01/16/2018.  Chest CT angiogramon  01/15/2018 revealed an 11.3 x 8.0 cm anterior mediastinal mass.  Abdomen and pelvic CT on 01/16/2018 revealeda 6.3 x 5.6 cm soft tissue mass or medium density fluid filling the urinary bladder.There was a1.5 cm mildly complex lower pole right renal cyst.There was a1.9 cm indeterminate left adrenal mass.There was a small pericardial effusion.  Renal ultrasound on 01/25/2018 revealed no evidence of bladder mass.  Finding on prior CT exam likely represented excretion of residual contrast material from a CTA chest exam performed 1 day prior   PET scan on 01/26/2018 revealed an 11.1 cm intensely hypermetabolic and partially necrotic right anterior mediastinal mass, compatible with malignancy. There was diffuse marrow hypermetabolism, nonspecific, cannot exclude an infiltrative neoplastic marrow process. There was no focal skeletal hypermetabolism. There was no focal bone lesions on the CT images.  There was nonspecific mild heterogeneous prostatic hypermetabolism.  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    Bone marrow biopsy on 02/03/2018 revealed a slightly hypercellular bone marrow for age with trilineage hematopoiesis.  There was no monoclonal B-cell population or abnormal T-cell phenotype identified.  Cytogenetics were normal (46, XY).  Echo on 01/15/2018 revealed an EF of 55-60%.  Hepatitis B, hepatitis C, and HIV testing was negative on 02/10/2018. G6PD assay was normal.  He s/p cycle #1 RCHOP chemotherapy (02/16/2018) with Neulasta support.  Symptomatically, patient is doing well. He denies any acute symptoms. Patient denies that he has experienced any B symptoms. He denies any interval infections. Chest discomfort has has improved. He has "tingling" in the tips of  his fingers.      Large B-cell lymphoma ([place])   01/15/2018 Initial Diagnosis    Large B-cell lymphoma ([place])    02/12/2018 -  Chemotherapy    The patient  had DOXOrubicin (ADRIAMYCIN) chemo injection 122 mg, 50 mg/m2 = 122 mg, Intravenous,  Once, 2 of 6 cycles Administration: 122 mg (02/16/2018), 122 mg (03/09/2018) palonosetron (ALOXI) injection 0.25 mg, 0.25 mg, Intravenous,  Once, 2 of 6 cycles Administration: 0.25 mg (02/16/2018), 0.25 mg (03/09/2018) pegfilgrastim (NEULASTA) injection 6 mg, 6 mg, Subcutaneous, Once, 2 of 6 cycles Administration: 6 mg (02/17/2018) vinCRIStine (ONCOVIN) 2 mg in sodium chloride 0.9 % 50 mL chemo infusion, 2 mg, Intravenous,  Once, 2 of 6 cycles Administration: 2 mg (02/16/2018), 2 mg (03/09/2018) riTUXimab (RITUXAN) 900 mg in sodium chloride 0.9 % 250 mL (2.6471 mg/mL) infusion, 375 mg/m2 = 900 mg, Intravenous,  Once, 2 of 6 cycles Administration: 900 mg (02/16/2018) cyclophosphamide (CYTOXAN) 1,820 mg in sodium chloride 0.9 % 250 mL chemo infusion, 750 mg/m2 = 1,820 mg, Intravenous,  Once, 2 of 6 cycles Administration: 1,820 mg (02/16/2018), 1,820 mg (03/09/2018)  for chemotherapy treatment.      Interval history-  Rash:  Ronnie Mcintyre presents with a rash.   Symptoms have been present for 4 days.   The rash is located on the chest and upper body.  Since then it has spread to the left axilla area.  Parent has tried over the counter calamine lotion for initial treatment showing rash not changed.  Discomfort is moderate.  It is pruritic.  Rash is not painful. Vesicles with clear draining fluid.  Patient has not had a fever.  Recent illnesses: none.  Sick contacts: none known  ECOG FS:1 - Symptomatic but completely ambulatory  Review of systems- Review of Systems  Constitutional: Negative.  Negative for chills, fever, malaise/fatigue and weight loss.  HENT: Negative for congestion and ear pain.   Eyes: Negative.  Negative for blurred vision and double vision.  Respiratory: Negative.  Negative for cough, sputum production and shortness of breath.   Cardiovascular: Negative.  Negative for chest pain,  palpitations and leg swelling.  Gastrointestinal: Negative.  Negative for abdominal pain, constipation, diarrhea, nausea and vomiting.  Genitourinary: Negative for dysuria, frequency and urgency.  Musculoskeletal: Negative for back pain and falls.  Skin: Positive for itching and rash.  Neurological: Negative.  Negative for weakness and headaches.  Endo/Heme/Allergies: Negative.  Does not bruise/bleed easily.  Psychiatric/Behavioral: Negative.  Negative for depression. The patient is not nervous/anxious and does not have insomnia.      Current treatment- S/p cycle 2 R-CHOP.  Last given on 03/09/2018.  No Known Allergies   Past Medical History:  Diagnosis Date  . Hypertension      Past Surgical History:  Procedure Laterality Date  . ANKLE SURGERY Right   . PORTACATH PLACEMENT Right 02/11/2018   Procedure: INSERTION PORT-A-CATH;  Surgeon: Jules Husbands, MD;  Location: [facility] ORS;  Service: General;  Laterality: Right;    Social History   Socioeconomic History  . Marital status: Single    Spouse name: Not on file  . Number of children: Not on file  . Years of education: Not on file  . Highest education level: Not on file  Occupational History  . Not on file  Social Needs  . Financial resource strain: Not on file  . Food insecurity:    Worry: Not on file    Inability: Not on file  . Transportation  needs:    Medical: Not on file    Non-medical: Not on file  Tobacco Use  . Smoking status: Never Smoker  . Smokeless tobacco: Never Used  Substance and Sexual Activity  . Alcohol use: Yes    Frequency: Never    Comment: rarely  . Drug use: No  . Sexual activity: Not on file  Lifestyle  . Physical activity:    Days per week: Not on file    Minutes per session: Not on file  . Stress: Not on file  Relationships  . Social connections:    Talks on phone: Not on file    Gets together: Not on file    Attends religious service: Not on file    Active member of club or  organization: Not on file    Attends meetings of clubs or organizations: Not on file    Relationship status: Not on file  . Intimate partner violence:    Fear of current or ex partner: Not on file    Emotionally abused: Not on file    Physically abused: Not on file    Forced sexual activity: Not on file  Other Topics Concern  . Not on file  Social History Narrative  . Not on file    Family History  Problem Relation Age of Onset  . Hypertension Mother   . Hypertension Father   . Multiple myeloma Father      Current Outpatient Medications:  .  allopurinol (ZYLOPRIM) 300 MG tablet, Take 1 tablet (300 mg total) by mouth daily., Disp: 90 tablet, Rfl: 1 .  aspirin 81 MG tablet, Take 81 mg by mouth daily. , Disp: , Rfl:  .  felodipine (PLENDIL) 5 MG 24 hr tablet, Take 5 mg by mouth daily., Disp: , Rfl:  .  fluticasone (FLONASE) 50 MCG/ACT nasal spray, Place 2 sprays into both nostrils daily., Disp: 16 g, Rfl: 0 .  hydrocortisone 1 % lotion, Apply 1 application topically 2 (two) times daily as needed for itching., Disp: , Rfl:  .  lidocaine-prilocaine (EMLA) cream, Apply to affected area once, Disp: 30 g, Rfl: 3 .  loratadine (CLARITIN) 10 MG tablet, Take 10 mg by mouth daily as needed for allergies. , Disp: , Rfl:  .  losartan-hydrochlorothiazide (HYZAAR) 100-25 MG tablet, Take 1 tablet by mouth daily., Disp: , Rfl:  .  pantoprazole (PROTONIX) 40 MG tablet, Take 1 tablet (40 mg total) by mouth daily. Use daily while on Prednisone for GI prophylaxis., Disp: 30 tablet, Rfl: 1 .  potassium chloride SA (K-DUR,KLOR-CON) 20 MEQ tablet, Take 1 tablet (20 mEq) daily x 3 days. Reserve additional tabs for use as directed by oncology., Disp: 6 tablet, Rfl: 0 .  predniSONE (DELTASONE) 50 MG tablet, Take 2 tablets (100 mg total) by mouth daily. Take on days 1-5 of chemotherapy., Disp: 60 tablet, Rfl: 1 .  simvastatin (ZOCOR) 40 MG tablet, Take 40 mg by mouth daily., Disp: , Rfl:  .  acetaminophen  (TYLENOL) 325 MG tablet, Take 2 tablets (650 mg total) by mouth every 6 (six) hours as needed for mild pain (or Fever >/= 101). (Patient not taking: Reported on 03/09/2018), Disp: , Rfl:  .  LORazepam (ATIVAN) 0.5 MG tablet, Take 1 tablet (0.5 mg total) by mouth every 6 (six) hours as needed (Nausea or vomiting). (Patient not taking: Reported on 03/09/2018), Disp: 30 tablet, Rfl: 0 .  ondansetron (ZOFRAN) 8 MG tablet, Take 1 tablet (8 mg total) by mouth 2 (  two) times daily as needed. Start on day 3 after cyclophosphamide chemotherapy. (Patient not taking: Reported on 03/09/2018), Disp: 30 tablet, Rfl: 1 .  oxyCODONE (OXY IR/ROXICODONE) 5 MG immediate release tablet, Take 1 tablet (5 mg total) by mouth every 6 (six) hours as needed for moderate pain. (Patient not taking: Reported on 03/09/2018), Disp: 30 tablet, Rfl: 0 .  valACYclovir (VALTREX) 1000 MG tablet, Take 1 tablet (1,000 mg total) by mouth 3 (three) times daily., Disp: 21 tablet, Rfl: 0  Physical exam:  Vitals:   03/19/18 1517  BP: 118/79  Pulse: (!) 20  Resp: 18  Temp: (!) 97.3 F (36.3 C)  TempSrc: Tympanic  Weight: 257 lb 2 oz (116.6 kg)   Physical Exam  Constitutional: He is oriented to person, place, and time. Vital signs are normal. He appears well-developed and well-nourished.  HENT:  Head: Normocephalic and atraumatic.  Eyes: Pupils are equal, round, and reactive to light.  Neck: Normal range of motion.  Cardiovascular: Normal rate, regular rhythm and normal heart sounds.  No murmur heard. Pulmonary/Chest: Effort normal and breath sounds normal. He has no wheezes.  Abdominal: Soft. Normal appearance and bowel sounds are normal. He exhibits no distension. There is no tenderness.  Musculoskeletal: Normal range of motion. He exhibits no edema.  Neurological: He is alert and oriented to person, place, and time.  Skin: Skin is warm and dry. Rash noted. Rash is vesicular.  T6-T7 dermatome; vesicles present with clear fluid  draining.  Nonpainful pruritic lesions.   Psychiatric: Judgment normal.            CMP Latest Ref Rng & Units 03/09/2018  Glucose 70 - 99 mg/dL 120(H)  BUN 6 - 20 mg/dL 19  Creatinine 0.61 - 1.24 mg/dL 1.06  Sodium 135 - 145 mmol/L 137  Potassium 3.5 - 5.1 mmol/L 3.2(L)  Chloride 98 - 111 mmol/L 101  CO2 22 - 32 mmol/L 27  Calcium 8.9 - 10.3 mg/dL 9.5  Total Protein 6.5 - 8.1 g/dL 7.1  Total Bilirubin 0.3 - 1.2 mg/dL 1.2  Alkaline Phos 38 - 126 U/L 76  AST 15 - 41 U/L 21  ALT 0 - 44 U/L 31   CBC Latest Ref Rng & Units 03/09/2018  WBC 3.8 - 10.6 K/uL 12.8(H)  Hemoglobin 13.0 - 18.0 g/dL 13.9  Hematocrit 40.0 - 52.0 % 40.2  Platelets 150 - 440 K/uL 265    No images are attached to the encounter.  No results found.   Assessment and plan- Patient is a 52 y.o. male who presents for pruritic lesions located on T6-T7 dermatomes.  Vesicles are present clear fluid draining.  They are nonpainful.  They are spreading.  1.  Large B-cell lymphoma: S/p 2 cycles R CHOP.  So far tolerating well.  Last treatment was on 03/09/2018.  Scheduled to RTC on 03/30/2018 for labs, assessment and cycle 3 of R-CHOP.  2.  Herpes zoster: Patient has had chickenpox in the past.  Unfortunately, this appears to be shingles.  Given his immunocompromise state, will treat with valacyclovir 1000 mg 3 times daily for 10 days.  Will reevaluate prior to cycle 3 R-CHOP.  Explained to patient if symptoms worsen or continue to spread especially close to neck or face, he will need to be seen ASAP in our clinic or the emergency room.  Patient understands.   Visit Diagnosis 1. Herpes zoster without complication   2. Large B-cell lymphoma ([place])     Patient expressed understanding and  was in agreement with this plan. He also understands that He can call clinic at any time with any questions, concerns, or complaints.   Greater than 50% was spent in counseling and coordination of care with this patient including but  not limited to discussion of the relevant topics above (See A&P) including, but not limited to diagnosis and management of acute and chronic medical conditions.    Faythe Casa, AGNP-C [facility] [place]- [phone] Pager- [phone] 03/25/2018 1:49 PM

## 2018-03-19 NOTE — Progress Notes (Signed)
Patient presents to clinic today with a rash on his left torso.  Patient states it came up a few days ago but noticed yesterday after mowing it has spread.  States it itches.  No complaints of pain.  Areas are red, raised and a few look like blisters that are weeping.  Patient's HR elevated, but he came up the stairs.

## 2018-03-30 ENCOUNTER — Other Ambulatory Visit: Payer: Self-pay

## 2018-03-30 ENCOUNTER — Inpatient Hospital Stay: Payer: Commercial Managed Care - PPO

## 2018-03-30 ENCOUNTER — Inpatient Hospital Stay: Payer: Commercial Managed Care - PPO | Attending: Hematology and Oncology

## 2018-03-30 ENCOUNTER — Encounter: Payer: Self-pay | Admitting: Hematology and Oncology

## 2018-03-30 ENCOUNTER — Inpatient Hospital Stay (HOSPITAL_BASED_OUTPATIENT_CLINIC_OR_DEPARTMENT_OTHER): Payer: Commercial Managed Care - PPO | Admitting: Hematology and Oncology

## 2018-03-30 VITALS — BP 116/81 | HR 96 | Temp 98.0°F | Resp 18 | Wt 254.3 lb

## 2018-03-30 DIAGNOSIS — E876 Hypokalemia: Secondary | ICD-10-CM

## 2018-03-30 DIAGNOSIS — Z807 Family history of other malignant neoplasms of lymphoid, hematopoietic and related tissues: Secondary | ICD-10-CM | POA: Diagnosis not present

## 2018-03-30 DIAGNOSIS — E883 Tumor lysis syndrome: Secondary | ICD-10-CM | POA: Insufficient documentation

## 2018-03-30 DIAGNOSIS — C8510 Unspecified B-cell lymphoma, unspecified site: Secondary | ICD-10-CM

## 2018-03-30 DIAGNOSIS — R17 Unspecified jaundice: Secondary | ICD-10-CM

## 2018-03-30 DIAGNOSIS — I10 Essential (primary) hypertension: Secondary | ICD-10-CM | POA: Insufficient documentation

## 2018-03-30 DIAGNOSIS — B029 Zoster without complications: Secondary | ICD-10-CM

## 2018-03-30 DIAGNOSIS — Z5111 Encounter for antineoplastic chemotherapy: Secondary | ICD-10-CM | POA: Insufficient documentation

## 2018-03-30 DIAGNOSIS — T451X5A Adverse effect of antineoplastic and immunosuppressive drugs, initial encounter: Secondary | ICD-10-CM

## 2018-03-30 DIAGNOSIS — G629 Polyneuropathy, unspecified: Secondary | ICD-10-CM | POA: Insufficient documentation

## 2018-03-30 DIAGNOSIS — G620 Drug-induced polyneuropathy: Secondary | ICD-10-CM

## 2018-03-30 DIAGNOSIS — C8522 Mediastinal (thymic) large B-cell lymphoma, intrathoracic lymph nodes: Secondary | ICD-10-CM

## 2018-03-30 DIAGNOSIS — Z5189 Encounter for other specified aftercare: Secondary | ICD-10-CM | POA: Insufficient documentation

## 2018-03-30 LAB — COMPREHENSIVE METABOLIC PANEL
ALT: 29 U/L (ref 0–44)
AST: 22 U/L (ref 15–41)
Albumin: 4.1 g/dL (ref 3.5–5.0)
Alkaline Phosphatase: 61 U/L (ref 38–126)
Anion gap: 9 (ref 5–15)
BUN: 15 mg/dL (ref 6–20)
CO2: 27 mmol/L (ref 22–32)
Calcium: 9.6 mg/dL (ref 8.9–10.3)
Chloride: 102 mmol/L (ref 98–111)
Creatinine, Ser: 1.01 mg/dL (ref 0.61–1.24)
GFR calc Af Amer: >60 mL/min (ref >60)
GFR calc non Af Amer: >60 mL/min (ref >60)
Glucose, Bld: 114 mg/dL — ABNORMAL HIGH (ref 70–99)
Potassium: 3.2 mmol/L — ABNORMAL LOW (ref 3.5–5.1)
Sodium: 138 mmol/L (ref 135–145)
Total Bilirubin: 1.5 mg/dL — ABNORMAL HIGH (ref 0.3–1.2)
Total Protein: 7 g/dL (ref 6.5–8.1)

## 2018-03-30 LAB — MAGNESIUM: Magnesium: 1.7 mg/dL (ref 1.7–2.4)

## 2018-03-30 LAB — CBC WITH DIFFERENTIAL/PLATELET
Basophils Absolute: 0.1 10*3/uL (ref 0–0.1)
Basophils Relative: 1 %
Eosinophils Absolute: 0.1 10*3/uL (ref 0–0.7)
Eosinophils Relative: 1 %
HCT: 38.7 % — ABNORMAL LOW (ref 40.0–52.0)
Hemoglobin: 13.4 g/dL (ref 13.0–18.0)
Lymphocytes Relative: 10 %
Lymphs Abs: 1 10*3/uL (ref 1.0–3.6)
MCH: 28.8 pg (ref 26.0–34.0)
MCHC: 34.5 g/dL (ref 32.0–36.0)
MCV: 83.6 fL (ref 80.0–100.0)
Monocytes Absolute: 1.1 10*3/uL — ABNORMAL HIGH (ref 0.2–1.0)
Monocytes Relative: 10 %
Neutro Abs: 8.3 10*3/uL — ABNORMAL HIGH (ref 1.4–6.5)
Neutrophils Relative %: 78 %
Platelets: 322 10*3/uL (ref 150–440)
RBC: 4.63 MIL/uL (ref 4.40–5.90)
RDW: 22 % — ABNORMAL HIGH (ref 11.5–14.5)
WBC: 10.6 10*3/uL (ref 3.8–10.6)

## 2018-03-30 LAB — BILIRUBIN, DIRECT: BILIRUBIN DIRECT: 0.1 mg/dL (ref 0.0–0.2)

## 2018-03-30 LAB — URIC ACID: Uric Acid, Serum: 4.9 mg/dL (ref 3.7–8.6)

## 2018-03-30 MED ORDER — VALACYCLOVIR HCL 500 MG PO TABS: 500.0000 mg | ORAL_TABLET | Freq: Two times a day (BID) | ORAL | 0 refills | Status: DC

## 2018-03-30 MED ORDER — HEPARIN SOD (PORK) LOCK FLUSH 100 UNIT/ML IV SOLN
500.0000 [IU] | Freq: Once | INTRAVENOUS | Status: AC
  Administered 2018-03-30: 500 [IU] via INTRAVENOUS

## 2018-03-30 MED ORDER — HEPARIN SOD (PORK) LOCK FLUSH 100 UNIT/ML IV SOLN
INTRAVENOUS | Status: AC
  Filled 2018-03-30: qty 5

## 2018-03-30 NOTE — Progress Notes (Signed)
Patient offers no complaints today. 

## 2018-03-30 NOTE — Progress Notes (Signed)
[facility] day:  03/30/2018   Chief Complaint: Ronnie Mcintyre is a 52 y.o. male with a large B-cell lymphoma who is seen for assessment prior to cycle #3 RCHOP chemotherapy.  HPI:  The patient was last seen in the medical oncology clinic on 03/09/2018.  At that time, he was doing well. He denied any B symptoms.  Chest discomfort had improved. He described "tingling" in the tips of his fingers.  Exam was unremarkable.  Potassium was 3.2.  He received supplemental potassium.  He received cycle #2 RCHOP with Neulasta support.  He was seen by Faythe Casa, NP on 03/19/2018 for a pruritic vesicular rash in the T6-T7 dermatomal distribution c/w shingles.  He began valacyclovir 1000 mg TID x 7 days.  During the interim, patient is doing well overall. He notes that is shingles has resolved. It has some residual pruritis, however notes that it has markedly improved. Patient denies nausea, vomiting, and changes to his bowel habits. He denies chest discomfort and shortness of breath.   Patient denies that he has experienced any B symptoms. He denies any interval infections. He has residual numbness in the tips of his fingers. Patient remains out of work at this time. He is making efforts to remain active by walking some in local department stores. This also helps to get him out of the confines of his home. Patient has been sleeping better.   Patient advises that he maintains a decreased appetite. Weight today is 254 lb 5 oz (115.4 kg), which compared to his last visit to the clinic, represents a 2 pound increase.     Patient denies pain in the clinic today.   Past Medical History:  Diagnosis Date  . Hypertension     Past Surgical History:  Procedure Laterality Date  . ANKLE SURGERY Right   . PORTACATH PLACEMENT Right 02/11/2018   Procedure: INSERTION PORT-A-CATH;  Surgeon: Jules Husbands, MD;  Location: [facility] ORS;  Service: General;  Laterality: Right;     Family History  Problem Relation Age of Onset  . Hypertension Mother   . Hypertension Father   . Multiple myeloma Father     Social History:  reports that he has never smoked. He has never used smokeless tobacco. He reports that he drinks alcohol. He reports that he does not use drugs.  He drinks a beer 1-2 x/week.  He works for [employer].  He lives in [place].  His is accompanied by his mother today.  Allergies: No Known Allergies  Current Medications: Current Outpatient Medications  Medication Sig Dispense Refill  . allopurinol (ZYLOPRIM) 300 MG tablet Take 1 tablet (300 mg total) by mouth daily. 90 tablet 1  . aspirin 81 MG tablet Take 81 mg by mouth daily.     . felodipine (PLENDIL) 5 MG 24 hr tablet Take 5 mg by mouth daily.    . fluticasone (FLONASE) 50 MCG/ACT nasal spray Place 2 sprays into both nostrils daily. 16 g 0  . hydrocortisone 1 % lotion Apply 1 application topically 2 (two) times daily as needed for itching.    . lidocaine-prilocaine (EMLA) cream Apply to affected area once 30 g 3  . loratadine (CLARITIN) 10 MG tablet Take 10 mg by mouth daily as needed for allergies.     [name] losartan-hydrochlorothiazide (HYZAAR) 100-25 MG tablet Take 1 tablet by mouth daily.    . pantoprazole (PROTONIX) 40 MG tablet Take 1 tablet (40 mg total)  by mouth daily. Use daily while on Prednisone for GI prophylaxis. 30 tablet 1  . predniSONE (DELTASONE) 50 MG tablet Take 2 tablets (100 mg total) by mouth daily. Take on days 1-5 of chemotherapy. 60 tablet 1  . simvastatin (ZOCOR) 40 MG tablet Take 40 mg by mouth daily.    . valACYclovir (VALTREX) 1000 MG tablet Take 1 tablet (1,000 mg total) by mouth 3 (three) times daily. 21 tablet 0  . acetaminophen (TYLENOL) 325 MG tablet Take 2 tablets (650 mg total) by mouth every 6 (six) hours as needed for mild pain (or Fever >/= 101). (Patient not taking: Reported on 03/09/2018)    . LORazepam (ATIVAN) 0.5 MG tablet Take 1 tablet (0.5 mg  total) by mouth every 6 (six) hours as needed (Nausea or vomiting). (Patient not taking: Reported on 03/09/2018) 30 tablet 0  . ondansetron (ZOFRAN) 8 MG tablet Take 1 tablet (8 mg total) by mouth 2 (two) times daily as needed. Start on day 3 after cyclophosphamide chemotherapy. (Patient not taking: Reported on 03/09/2018) 30 tablet 1  . oxyCODONE (OXY IR/ROXICODONE) 5 MG immediate release tablet Take 1 tablet (5 mg total) by mouth every 6 (six) hours as needed for moderate pain. (Patient not taking: Reported on 03/09/2018) 30 tablet 0  . potassium chloride SA (K-DUR,KLOR-CON) 20 MEQ tablet Take 1 tablet (20 mEq) daily x 3 days. Reserve additional tabs for use as directed by oncology. (Patient not taking: Reported on 03/30/2018) 6 tablet 0   No current facility-administered medications for this visit.     Review of Systems  Constitutional: Negative.  Negative for diaphoresis, fever, malaise/fatigue and weight loss (weight up 2 pounds).       "I am doing pretty good".  Active.  Walking.   HENT: Negative.  Negative for congestion, ear discharge, ear pain, hearing loss, nosebleeds, sinus pain, sore throat and tinnitus.   Eyes: Negative.  Negative for blurred vision, double vision, photophobia, pain and discharge.  Respiratory: Negative.  Negative for cough, hemoptysis, sputum production and shortness of breath.   Cardiovascular: Negative.  Negative for chest pain, palpitations, orthopnea, leg swelling and PND.  Gastrointestinal: Negative.  Negative for abdominal pain, blood in stool, constipation, diarrhea, melena, nausea and vomiting.  Genitourinary: Negative.  Negative for dysuria, frequency, hematuria and urgency.  Musculoskeletal: Negative.  Negative for back pain, falls, joint pain and myalgias.  Skin: Positive for itching and rash (herpetic rash to LEFT lateral torso - resolving).       Resolving shingles  Neurological: Positive for sensory change (minimal numbness in tips of fingers). Negative  for dizziness, tremors, weakness and headaches.  Endo/Heme/Allergies: Negative.  Does not bruise/bleed easily.  Psychiatric/Behavioral: Negative for depression, memory loss and suicidal ideas. The patient has insomnia (improved). The patient is not nervous/anxious.        Sleeping 6 hours/day.  All other systems reviewed and are negative.  Performance status (ECOG): 1  Vital Signs BP 116/81 (BP Location: Left Arm, Patient Position: Sitting)   Pulse 96   Temp 98 F (36.7 C) (Tympanic)   Resp 18   Wt 254 lb 5 oz (115.4 kg)   BMI 34.49 kg/m   Physical Exam  Constitutional: He is oriented to person, place, and time and well-developed, well-nourished, and in no distress.  HENT:  Head: Normocephalic and atraumatic.  Shaved head.  Eyes: Pupils are equal, round, and reactive to light. EOM are normal. No scleral icterus.  Blue eyes  Neck: Normal range of motion.  Neck supple. No tracheal deviation present. No thyromegaly present.  Cardiovascular: Normal rate, regular rhythm and normal heart sounds. Exam reveals no gallop and no friction rub.  No murmur heard. Pulmonary/Chest: Effort normal and breath sounds normal. No respiratory distress. He has no wheezes. He has no rales.  Abdominal: Soft. Bowel sounds are normal. He exhibits no distension. There is no tenderness.  Musculoskeletal: Normal range of motion. He exhibits no edema or tenderness.  Lymphadenopathy:    He has no cervical adenopathy.    He has no axillary adenopathy.       Right: No inguinal and no supraclavicular adenopathy present.       Left: No inguinal and no supraclavicular adenopathy present.  Neurological: He is alert and oriented to person, place, and time.  Able to walk on heels (no foot drop).  Skin: Skin is warm and dry. Rash (residual hyperpigmentation in a dermatomal distribution) noted. No erythema.  Psychiatric: Mood, affect and judgment normal.  Nursing note and vitals reviewed.   Infusion on 03/30/2018   Component Date Value Ref Range Status  . Magnesium 03/30/2018 1.7  1.7 - 2.4 mg/dL Final   Performed at [facility], [address] [place], [place] [address]  . Sodium 03/30/2018 138  135 - 145 mmol/L Final  . Potassium 03/30/2018 3.2* 3.5 - 5.1 mmol/L Final  . Chloride 03/30/2018 102  98 - 111 mmol/L Final  . CO2 03/30/2018 27  22 - 32 mmol/L Final  . Glucose, Bld 03/30/2018 114* 70 - 99 mg/dL Final  . BUN 03/30/2018 15  6 - 20 mg/dL Final  . Creatinine, Ser 03/30/2018 1.01  0.61 - 1.24 mg/dL Final  . Calcium 03/30/2018 9.6  8.9 - 10.3 mg/dL Final  . Total Protein 03/30/2018 7.0  6.5 - 8.1 g/dL Final  . Albumin 03/30/2018 4.1  3.5 - 5.0 g/dL Final  . AST 03/30/2018 22  15 - 41 U/L Final  . ALT 03/30/2018 29  0 - 44 U/L Final  . Alkaline Phosphatase 03/30/2018 61  38 - 126 U/L Final  . Total Bilirubin 03/30/2018 1.5* 0.3 - 1.2 mg/dL Final  . GFR calc non Af Amer 03/30/2018 >60  >60 mL/min Final  . GFR calc Af Amer 03/30/2018 >60  >60 mL/min Final   Comment: (NOTE) The eGFR has been calculated using the CKD EPI equation. This calculation has not been validated in all clinical situations. eGFR's persistently <60 mL/min signify possible Chronic Kidney Disease.   Georgiann Hahn gap 03/30/2018 9  5 - 15 Final   Performed at [facility], [address] [place], [place] [address]  . WBC 03/30/2018 10.6  3.8 - 10.6 K/uL Final  . RBC 03/30/2018 4.63  4.40 - 5.90 MIL/uL Final  . Hemoglobin 03/30/2018 13.4  13.0 - 18.0 g/dL Final  . HCT 03/30/2018 38.7* 40.0 - 52.0 % Final  . MCV 03/30/2018 83.6  80.0 - 100.0 fL Final  . MCH 03/30/2018 28.8  26.0 - 34.0 pg Final  . MCHC 03/30/2018 34.5  32.0 - 36.0 g/dL Final  . RDW 03/30/2018 22.0* 11.5 - 14.5 % Final  . Platelets 03/30/2018 322  150 - 440 K/uL Final  . Neutrophils Relative % 03/30/2018 78  % Final  . Neutro Abs 03/30/2018 8.3* 1.4 - 6.5 K/uL Final  . Lymphocytes Relative 03/30/2018 10  % Final  . Lymphs Abs 03/30/2018 1.0   1.0 - 3.6 K/uL Final  . Monocytes Relative 03/30/2018 10  % Final  .  Monocytes Absolute 03/30/2018 1.1* 0.2 - 1.0 K/uL Final  . Eosinophils Relative 03/30/2018 1  % Final  . Eosinophils Absolute 03/30/2018 0.1  0 - 0.7 K/uL Final  . Basophils Relative 03/30/2018 1  % Final  . Basophils Absolute 03/30/2018 0.1  0 - 0.1 K/uL Final   Performed at [facility], [address] [place], [place] [address]    Assessment:  Ronnie Mcintyre is a 52 y.o. 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  [facility] pathology revealed necrotic tissue. [facility] consultation revealed neoplastic cells are positive for CD20, BCL-6, MUM-1, and BCL-2. The neoplastic cells were negative for CD30, CD3, , CD10, c-MYC, EBV ISH, AE1/3, and PAX-8. Ki67 is 60-70%. 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 The absence of CD30 expression makes primary mediastinal large B-cell lymphoma less likely. FISH for MYC rearrangement was negative. IPI score, age adjusted IPI, are low (1) and NCCN IPI score is low-intermediate (2).  He presented with upper chest fullness with radiation to his neck and arm. He has had B symptoms (sweats and weight loss) worrisome for lymphoma. LDHis 273. Uric acid was normal.  Beta-HCG and AFP were normal on 01/16/2018.  Chest CT angiogramon 01/15/2018 revealed an 11.3 x 8.0 cm anterior mediastinal mass.  Abdomen and pelvic CT on 01/16/2018 revealed a 6.3 x 5.6 cm soft tissue mass or medium density fluid filling the urinary bladder.  There was a 1.5 cm mildly complex lower pole right renal cyst. There was a 1.9 cm indeterminate left adrenal mass.  There was a small pericardial effusion.  Renal ultrasound on 01/25/2018 revealed no evidence of bladder mass.  Finding on prior CT exam likely represented excretion of residual contrast material from a CTA chest exam performed 1 day prior   PET scan on  01/26/2018 revealed an 11.1 cm intensely hypermetabolic and partially necrotic right anterior mediastinal mass, compatible with malignancy. There was diffuse marrow hypermetabolism, nonspecific, cannot exclude an infiltrative neoplastic marrow process. There was no focal skeletal hypermetabolism. There was no focal bone lesions on the CT images.  There was nonspecific mild heterogeneous prostatic hypermetabolism.  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    Bone marrow biopsy on 02/03/2018 revealed a slightly hypercellular bone marrow for age with trilineage hematopoiesis.  There was no monoclonal B-cell population or abnormal T-cell phenotype identified.  Cytogenetics were normal (46, XY).  Echo on 01/15/2018 revealed an EF of 55-60%.  Hepatitis B, hepatitis C, and HIV testing was negative on 02/10/2018.  G6PD assay was normal.  He s/p 2 cycles of RCHOP chemotherapy (02/16/2018 - 03/09/2018) with Neulasta support.  He was diagnosed with T6-T7 herpes zoster on 03/19/2018.  He is on valacyclovir.  Symptomatically, patient is doing well. He denies any acute symptoms today. Chest discomfort and insomnia have improved. Patient is trying to remain active. Fingertip neuropathy is stable. Patient denies any B symptoms. He denies any interval infections. Exam reveals hyperpigmentation in a dermatomal distribution secondary to recent shingles. WBC 10,600 ([place] 8300). Potassium 3.2. Total bilirubin 1.5 (direct 0.1).   Plan: 1. Labs today:  CBC with diff, CMP, Mg, uric acid, direct bilirubin 2. DLBCL - treatment ongoing  Doing well overall with treatments. Minimal side effects.   Discuss interval imaging prior to next cycle. Orders placed for CT of the chest, abdomen, and pelvis on 04/19/2018.  Labs reviewed. Blood counts stable. Total bilirubin elevated at 1.5 (requires dose reduction). Will postpone cycle #3 RCHOP.  Discuss symptom  management.  Patient has antiemetics and pain medications at home to use on a PRN basis. Patient  advising that the  prescribed interventions are adequate at this point. Continue all medications as previously prescribed.  3. Shingles - resolving  Herpetic rash to LEFT lateral chest wall resolving.  Patient has completed a 7 day course of Valacyclovir TID.  Discuss prophylactic treatment.   Rx sent [place] Valacyclovir 500 mg BID.  4. HYPOkalemia - ongoing  K+ 3.2 today. Will treatment with oral KCl 20 mEq daily x 3 days. Increase intake of K+ rich foods.  5. 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 Not noted to be imposing any functional limitations.   Continue to monitor for progression.  6. Tumor lysis monitoring - stable  Labs reviewed. No evidence of TLS.  Continue prophylactic allopurinol dose.   Encouraged increased fluid intake.  7. RTC on 04/01/2018 for MD assessment and labs (CBC with diff, CMP, Mg) and cycle #3 RCHOP with Neulasta support.  8. RTC on 04/22/2018 for MD assessment, labs (CBC with diff, CMP, Mg, uric acid   Honor Loh, NP  03/30/2018, 9:15 AM  I saw and evaluated the patient, participating in the key portions of the service and reviewing pertinent diagnostic studies and records.  I reviewed the nurse practitioner's note and agree with the findings and the plan.  The assessment and plan were discussed with the patient.  A few questions were asked by the patient and answered.   Nolon Stalls, MD 03/30/2018,9:15 AM

## 2018-03-30 NOTE — Progress Notes (Signed)
Per Rodena Piety RN per Dr. Mike Gip, no treatment at this time. Pt stable at discharge.

## 2018-03-31 ENCOUNTER — Inpatient Hospital Stay: Payer: Commercial Managed Care - PPO

## 2018-04-01 ENCOUNTER — Inpatient Hospital Stay: Payer: Commercial Managed Care - PPO

## 2018-04-01 ENCOUNTER — Encounter: Payer: Self-pay | Admitting: Hematology and Oncology

## 2018-04-01 ENCOUNTER — Other Ambulatory Visit: Payer: Self-pay | Admitting: Hematology and Oncology

## 2018-04-01 ENCOUNTER — Inpatient Hospital Stay (HOSPITAL_BASED_OUTPATIENT_CLINIC_OR_DEPARTMENT_OTHER): Payer: Commercial Managed Care - PPO | Admitting: Hematology and Oncology

## 2018-04-01 VITALS — BP 126/81 | HR 105 | Temp 99.1°F | Resp 18 | Ht 72.0 in | Wt 251.3 lb

## 2018-04-01 DIAGNOSIS — Z8507 Personal history of malignant neoplasm of pancreas: Secondary | ICD-10-CM

## 2018-04-01 DIAGNOSIS — E883 Tumor lysis syndrome: Secondary | ICD-10-CM

## 2018-04-01 DIAGNOSIS — E876 Hypokalemia: Secondary | ICD-10-CM | POA: Diagnosis not present

## 2018-04-01 DIAGNOSIS — C8522 Mediastinal (thymic) large B-cell lymphoma, intrathoracic lymph nodes: Secondary | ICD-10-CM

## 2018-04-01 DIAGNOSIS — R17 Unspecified jaundice: Secondary | ICD-10-CM

## 2018-04-01 DIAGNOSIS — G629 Polyneuropathy, unspecified: Secondary | ICD-10-CM

## 2018-04-01 DIAGNOSIS — C8510 Unspecified B-cell lymphoma, unspecified site: Secondary | ICD-10-CM

## 2018-04-01 DIAGNOSIS — Z5111 Encounter for antineoplastic chemotherapy: Secondary | ICD-10-CM | POA: Diagnosis not present

## 2018-04-01 DIAGNOSIS — Z5112 Encounter for antineoplastic immunotherapy: Secondary | ICD-10-CM

## 2018-04-01 LAB — CBC WITH DIFFERENTIAL/PLATELET
Basophils Absolute: 0.1 10*3/uL (ref 0–0.1)
Basophils Relative: 1 %
Eosinophils Absolute: 0 10*3/uL (ref 0–0.7)
Eosinophils Relative: 0 %
HCT: 38.8 % — ABNORMAL LOW (ref 40.0–52.0)
Hemoglobin: 13.5 g/dL (ref 13.0–18.0)
Lymphocytes Relative: 5 %
Lymphs Abs: 0.6 10*3/uL — ABNORMAL LOW (ref 1.0–3.6)
MCH: 29 pg (ref 26.0–34.0)
MCHC: 34.7 g/dL (ref 32.0–36.0)
MCV: 83.5 fL (ref 80.0–100.0)
Monocytes Absolute: 0.4 10*3/uL (ref 0.2–1.0)
Monocytes Relative: 4 %
Neutro Abs: 10.4 10*3/uL — ABNORMAL HIGH (ref 1.4–6.5)
Neutrophils Relative %: 90 %
Platelets: 347 10*3/uL (ref 150–440)
RBC: 4.65 MIL/uL (ref 4.40–5.90)
RDW: 22.8 % — ABNORMAL HIGH (ref 11.5–14.5)
WBC: 11.6 10*3/uL — ABNORMAL HIGH (ref 3.8–10.6)

## 2018-04-01 LAB — BILIRUBIN, DIRECT: Bilirubin, Direct: 0.2 mg/dL (ref 0.0–0.2)

## 2018-04-01 LAB — COMPREHENSIVE METABOLIC PANEL
ALT: 27 U/L (ref 0–44)
AST: 20 U/L (ref 15–41)
Albumin: 4.3 g/dL (ref 3.5–5.0)
Alkaline Phosphatase: 62 U/L (ref 38–126)
Anion gap: 9 (ref 5–15)
BUN: 28 mg/dL — ABNORMAL HIGH (ref 6–20)
CO2: 27 mmol/L (ref 22–32)
Calcium: 9.5 mg/dL (ref 8.9–10.3)
Chloride: 101 mmol/L (ref 98–111)
Creatinine, Ser: 1.09 mg/dL (ref 0.61–1.24)
GFR calc Af Amer: >60 mL/min (ref >60)
GFR calc non Af Amer: >60 mL/min (ref >60)
Glucose, Bld: 126 mg/dL — ABNORMAL HIGH (ref 70–99)
Potassium: 3.3 mmol/L — ABNORMAL LOW (ref 3.5–5.1)
Sodium: 137 mmol/L (ref 135–145)
Total Bilirubin: 1.2 mg/dL (ref 0.3–1.2)
Total Protein: 7.3 g/dL (ref 6.5–8.1)

## 2018-04-01 MED ORDER — SODIUM CHLORIDE 0.9 % IV SOLN
375.0000 mg/m2 | Freq: Once | INTRAVENOUS | Status: AC
  Administered 2018-04-01: 900 mg via INTRAVENOUS
  Filled 2018-04-01: qty 50

## 2018-04-01 MED ORDER — SODIUM CHLORIDE 0.9 % IV SOLN: 10.0000 mg | Freq: Once | INTRAVENOUS | Status: DC

## 2018-04-01 MED ORDER — DEXAMETHASONE SODIUM PHOSPHATE 10 MG/ML IJ SOLN
10.0000 mg | Freq: Once | INTRAMUSCULAR | Status: AC
  Administered 2018-04-01: 10 mg via INTRAVENOUS
  Filled 2018-04-01: qty 1

## 2018-04-01 MED ORDER — HEPARIN SOD (PORK) LOCK FLUSH 100 UNIT/ML IV SOLN
500.0000 [IU] | Freq: Once | INTRAVENOUS | Status: AC
  Administered 2018-04-01: 500 [IU] via INTRAVENOUS
  Filled 2018-04-01: qty 5

## 2018-04-01 MED ORDER — ACETAMINOPHEN 325 MG PO TABS
650.0000 mg | ORAL_TABLET | Freq: Once | ORAL | Status: AC
  Administered 2018-04-01: 650 mg via ORAL
  Filled 2018-04-01: qty 2

## 2018-04-01 MED ORDER — HEPARIN SOD (PORK) LOCK FLUSH 100 UNIT/ML IV SOLN
INTRAVENOUS | Status: AC
  Filled 2018-04-01: qty 5

## 2018-04-01 MED ORDER — DIPHENHYDRAMINE HCL 25 MG PO CAPS
50.0000 mg | ORAL_CAPSULE | Freq: Once | ORAL | Status: AC
  Administered 2018-04-01: 50 mg via ORAL
  Filled 2018-04-01: qty 2

## 2018-04-01 MED ORDER — SODIUM CHLORIDE 0.9% FLUSH
10.0000 mL | INTRAVENOUS | Status: DC | PRN
  Filled 2018-04-01: qty 10

## 2018-04-01 MED ORDER — VINCRISTINE SULFATE CHEMO INJECTION 1 MG/ML
2.0000 mg | Freq: Once | INTRAVENOUS | Status: AC
  Administered 2018-04-01: 2 mg via INTRAVENOUS
  Filled 2018-04-01: qty 2

## 2018-04-01 MED ORDER — DOXORUBICIN HCL CHEMO IV INJECTION 2 MG/ML
50.0000 mg/m2 | Freq: Once | INTRAVENOUS | Status: AC
  Administered 2018-04-01: 122 mg via INTRAVENOUS
  Filled 2018-04-01: qty 50

## 2018-04-01 MED ORDER — SODIUM CHLORIDE 0.9 % IV SOLN: 375.0000 mg/m2 | Freq: Once | INTRAVENOUS | Status: DC

## 2018-04-01 MED ORDER — PALONOSETRON HCL INJECTION 0.25 MG/5ML
0.2500 mg | Freq: Once | INTRAVENOUS | Status: AC
  Administered 2018-04-01: 0.25 mg via INTRAVENOUS
  Filled 2018-04-01: qty 5

## 2018-04-01 MED ORDER — SODIUM CHLORIDE 0.9 % IV SOLN
750.0000 mg/m2 | Freq: Once | INTRAVENOUS | Status: AC
  Administered 2018-04-01: 1820 mg via INTRAVENOUS
  Filled 2018-04-01: qty 91

## 2018-04-01 MED ORDER — SODIUM CHLORIDE 0.9 % IV SOLN
Freq: Once | INTRAVENOUS | Status: AC
  Administered 2018-04-01: 12:00:00 via INTRAVENOUS
  Filled 2018-04-01: qty 250

## 2018-04-01 NOTE — Progress Notes (Signed)
Patient receiving treatment today per Honor Loh, NP/Dr. Mike Gip.

## 2018-04-01 NOTE — Progress Notes (Signed)
[facility] day:  04/01/2018   Chief Complaint: Ronnie Mcintyre is a 52 y.o. male with a large B-cell lymphoma who is seen for assessment prior to cycle #3 RCHOP chemotherapy.  HPI:  The patient was last seen in the medical oncology clinic on 03/30/2018.  At that time, he was doing well.  Shingles had resolved.  Potassium was 3.2.  He received 3 days of oral supplementation.  Bilirubin was elevated (1.5).  Decision was made to postpone chemotherapy until resolution.  During the interim, patient doing well. He denies any acute changes since last assessment. Patient denies that he has experienced any B symptoms. He denies any interval infections.    Patient advises that he maintains an adequate appetite. He is eating well. Weight today is 251 lb 4.8 oz (114 kg), which compared to his last visit to the clinic, represents a  3 pound decrease. Patient has not started order potassium supplement as previously prescribed.   Patient denies pain in the clinic today.   Past Medical History:  Diagnosis Date  . Hypertension     Past Surgical History:  Procedure Laterality Date  . ANKLE SURGERY Right   . PORTACATH PLACEMENT Right 02/11/2018   Procedure: INSERTION PORT-A-CATH;  Surgeon: Jules Husbands, MD;  Location: [facility] ORS;  Service: General;  Laterality: Right;    Family History  Problem Relation Age of Onset  . Hypertension Mother   . Hypertension Father   . Multiple myeloma Father     Social History:  reports that he has never smoked. He has never used smokeless tobacco. He reports that he drinks alcohol. He reports that he does not use drugs.  He drinks a beer 1-2 x/week.  He works for [employer].  He lives in [place].  His is accompanied by his mother today.  Allergies: No Known Allergies  Current Medications: Current Outpatient Medications  Medication Sig Dispense Refill  . allopurinol (ZYLOPRIM) 300 MG tablet Take 1 tablet  (300 mg total) by mouth daily. 90 tablet 1  . aspirin 81 MG tablet Take 81 mg by mouth daily.     . felodipine (PLENDIL) 5 MG 24 hr tablet Take 5 mg by mouth daily.    [name] losartan-hydrochlorothiazide (HYZAAR) 100-25 MG tablet Take 1 tablet by mouth daily.    . simvastatin (ZOCOR) 40 MG tablet Take 40 mg by mouth daily.    . valACYclovir (VALTREX) 500 MG tablet Take 1 tablet (500 mg total) by mouth 2 (two) times daily. 60 tablet 0  . acetaminophen (TYLENOL) 325 MG tablet Take 2 tablets (650 mg total) by mouth every 6 (six) hours as needed for mild pain (or Fever >/= 101). (Patient not taking: Reported on 03/09/2018)    . fluticasone (FLONASE) 50 MCG/ACT nasal spray Place 2 sprays into both nostrils daily. (Patient not taking: Reported on 04/01/2018) 16 g 0  . hydrocortisone 1 % lotion Apply 1 application topically 2 (two) times daily as needed for itching.    . lidocaine-prilocaine (EMLA) cream Apply to affected area once (Patient not taking: Reported on 04/01/2018) 30 g 3  . loratadine (CLARITIN) 10 MG tablet Take 10 mg by mouth daily as needed for allergies.     [name] LORazepam (ATIVAN) 0.5 MG tablet Take 1 tablet (0.5 mg total) by mouth every 6 (six) hours as needed (Nausea or vomiting). (Patient not taking: Reported on 03/09/2018) 30 tablet 0  . ondansetron (ZOFRAN) 8 MG tablet  Take 1 tablet (8 mg total) by mouth 2 (two) times daily as needed. Start on day 3 after cyclophosphamide chemotherapy. (Patient not taking: Reported on 03/09/2018) 30 tablet 1  . oxyCODONE (OXY IR/ROXICODONE) 5 MG immediate release tablet Take 1 tablet (5 mg total) by mouth every 6 (six) hours as needed for moderate pain. (Patient not taking: Reported on 03/09/2018) 30 tablet 0  . pantoprazole (PROTONIX) 40 MG tablet Take 1 tablet (40 mg total) by mouth daily. Use daily while on Prednisone for GI prophylaxis. (Patient not taking: Reported on 04/01/2018) 30 tablet 1  . potassium chloride SA (K-DUR,KLOR-CON) 20 MEQ tablet Take 1 tablet  (20 mEq) daily x 3 days. Reserve additional tabs for use as directed by oncology. (Patient not taking: Reported on 03/30/2018) 6 tablet 0  . predniSONE (DELTASONE) 50 MG tablet Take 2 tablets (100 mg total) by mouth daily. Take on days 1-5 of chemotherapy. (Patient not taking: Reported on 04/01/2018) 60 tablet 1   No current facility-administered medications for this visit.    Facility-Administered Medications Ordered in Other Visits  Medication Dose Route Frequency Provider Last Rate Last Dose  . heparin lock flush 100 unit/mL  500 Units Intravenous Once Corcoran, Melissa C, MD      . sodium chloride flush (NS) 0.9 % injection 10 mL  10 mL Intravenous PRN Lequita Asal, MD        Review of Systems  Constitutional: Positive for weight loss (weight down 3 pounds). Negative for diaphoresis, fever and malaise/fatigue.       Feels fine.  No complaints.  HENT: Negative.  Negative for congestion, ear discharge, ear pain, hearing loss, nosebleeds, sinus pain, sore throat and tinnitus.   Eyes: Negative.  Negative for blurred vision, double vision, photophobia, pain, discharge and redness.  Respiratory: Negative.  Negative for cough, hemoptysis, sputum production and shortness of breath.   Cardiovascular: Negative.  Negative for chest pain, palpitations, orthopnea, leg swelling and PND.  Gastrointestinal: Negative.  Negative for abdominal pain, blood in stool, constipation, diarrhea, melena, nausea and vomiting.  Genitourinary: Negative.  Negative for dysuria, frequency, hematuria and urgency.  Musculoskeletal: Negative.  Negative for back pain, joint pain, myalgias and neck pain.  Skin: Negative for itching and rash.       Shingles resolved.  Neurological: Positive for sensory change (minimal numbness in tips of fingers). Negative for dizziness, tremors, speech change, focal weakness, weakness and headaches.  Endo/Heme/Allergies: Negative.  Does not bruise/bleed easily.  Psychiatric/Behavioral:  Negative for depression, memory loss and suicidal ideas. The patient is not nervous/anxious and does not have insomnia (sleeping well).   All other systems reviewed and are negative.  Performance status (ECOG): 1  Vital Signs BP 126/81 (BP Location: Left Arm, Patient Position: Sitting)   Pulse (!) 105   Temp 99.1 F (37.3 C) (Tympanic)   Resp 18   Ht 6' (1.829 m)   Wt 251 lb 4.8 oz (114 kg)   SpO2 97%   BMI 34.08 kg/m   Physical Exam  Constitutional: He is oriented to person, place, and time and well-developed, well-nourished, and in no distress.  HENT:  Head: Normocephalic and atraumatic.  Mouth/Throat: No oropharyngeal exudate.  Near alopecia.  Eyes: Pupils are equal, round, and reactive to light. Conjunctivae and EOM are normal. No scleral icterus.  Blue eyes  Neck: Normal range of motion. Neck supple. No JVD present.  Cardiovascular: Normal rate, regular rhythm and normal heart sounds. Exam reveals no gallop and no friction  rub.  No murmur heard. Pulmonary/Chest: Breath sounds normal. No respiratory distress. He has no wheezes. He has no rales.  Abdominal: He exhibits no distension and no mass. There is no tenderness. There is no rebound and no guarding.  Musculoskeletal: Normal range of motion. He exhibits no edema or tenderness.  Lymphadenopathy:    He has no cervical adenopathy.    He has no axillary adenopathy.       Right: No supraclavicular adenopathy present.       Left: No supraclavicular adenopathy present.  Neurological: He is alert and oriented to person, place, and time. Gait normal.  Skin: Skin is warm. Rash (hyperpigmentation in area of old zoster) noted. No erythema.  Psychiatric: Mood and judgment normal.  Nursing note and vitals reviewed.   Infusion on 04/01/2018  Component Date Value Ref Range Status  . Sodium 04/01/2018 137  135 - 145 mmol/L Final  . Potassium 04/01/2018 3.3* 3.5 - 5.1 mmol/L Final  . Chloride 04/01/2018 101  98 - 111 mmol/L Final   . CO2 04/01/2018 27  22 - 32 mmol/L Final  . Glucose, Bld 04/01/2018 126* 70 - 99 mg/dL Final  . BUN 04/01/2018 28* 6 - 20 mg/dL Final  . Creatinine, Ser 04/01/2018 1.09  0.61 - 1.24 mg/dL Final  . Calcium 04/01/2018 9.5  8.9 - 10.3 mg/dL Final  . Total Protein 04/01/2018 7.3  6.5 - 8.1 g/dL Final  . Albumin 04/01/2018 4.3  3.5 - 5.0 g/dL Final  . AST 04/01/2018 20  15 - 41 U/L Final  . ALT 04/01/2018 27  0 - 44 U/L Final  . Alkaline Phosphatase 04/01/2018 62  38 - 126 U/L Final  . Total Bilirubin 04/01/2018 1.2  0.3 - 1.2 mg/dL Final  . GFR calc non Af Amer 04/01/2018 >60  >60 mL/min Final  . GFR calc Af Amer 04/01/2018 >60  >60 mL/min Final   Comment: (NOTE) The eGFR has been calculated using the CKD EPI equation. This calculation has not been validated in all clinical situations. eGFR's persistently <60 mL/min signify possible Chronic Kidney Disease.   Georgiann Hahn gap 04/01/2018 9  5 - 15 Final   Performed at [facility], [address] [place],  [address]  . WBC 04/01/2018 11.6* 3.8 - 10.6 K/uL Final  . RBC 04/01/2018 4.65  4.40 - 5.90 MIL/uL Final  . Hemoglobin 04/01/2018 13.5  13.0 - 18.0 g/dL Final  . HCT 04/01/2018 38.8* 40.0 - 52.0 % Final  . MCV 04/01/2018 83.5  80.0 - 100.0 fL Final  . MCH 04/01/2018 29.0  26.0 - 34.0 pg Final  . MCHC 04/01/2018 34.7  32.0 - 36.0 g/dL Final  . RDW 04/01/2018 22.8* 11.5 - 14.5 % Final  . Platelets 04/01/2018 347  150 - 440 K/uL Final  . Neutrophils Relative % 04/01/2018 90  % Final  . Neutro Abs 04/01/2018 10.4* 1.4 - 6.5 K/uL Final  . Lymphocytes Relative 04/01/2018 5  % Final  . Lymphs Abs 04/01/2018 0.6* 1.0 - 3.6 K/uL Final  . Monocytes Relative 04/01/2018 4  % Final  . Monocytes Absolute 04/01/2018 0.4  0.2 - 1.0 K/uL Final  . Eosinophils Relative 04/01/2018 0  % Final  . Eosinophils Absolute 04/01/2018 0.0  0 - 0.7 K/uL Final  . Basophils Relative 04/01/2018 1  % Final  . Basophils Absolute 04/01/2018 0.1  0 - 0.1  K/uL Final   Performed at [facility], [address] [place], [place]  [address]  . Bilirubin, Direct 04/01/2018 0.2  0.0 - 0.2 mg/dL Final   Performed at [facility], [address] [place], [place] [address]  Orders Only on 03/30/2018  Component Date Value Ref Range Status  . Bilirubin, Direct 03/30/2018 0.1  0.0 - 0.2 mg/dL Final   Performed at [facility], [address], [place], [place] [address]  Infusion on 03/30/2018  Component Date Value Ref Range Status  . Uric Acid, Serum 03/30/2018 4.9  3.7 - 8.6 mg/dL Final   Performed at [facility], [address] [place], [place] [address]  . Magnesium 03/30/2018 1.7  1.7 - 2.4 mg/dL Final   Performed at [facility], [address] [place], [place] [address]  . Sodium 03/30/2018 138  135 - 145 mmol/L Final  . Potassium 03/30/2018 3.2* 3.5 - 5.1 mmol/L Final  . Chloride 03/30/2018 102  98 - 111 mmol/L Final  . CO2 03/30/2018 27  22 - 32 mmol/L Final  . Glucose, Bld 03/30/2018 114* 70 - 99 mg/dL Final  . BUN 03/30/2018 15  6 - 20 mg/dL Final  . Creatinine, Ser 03/30/2018 1.01  0.61 - 1.24 mg/dL Final  . Calcium 03/30/2018 9.6  8.9 - 10.3 mg/dL Final  . Total Protein 03/30/2018 7.0  6.5 - 8.1 g/dL Final  . Albumin 03/30/2018 4.1  3.5 - 5.0 g/dL Final  . AST 03/30/2018 22  15 - 41 U/L Final  . ALT 03/30/2018 29  0 - 44 U/L Final  . Alkaline Phosphatase 03/30/2018 61  38 - 126 U/L Final  . Total Bilirubin 03/30/2018 1.5* 0.3 - 1.2 mg/dL Final  . GFR calc non Af Amer 03/30/2018 >60  >60 mL/min Final  . GFR calc Af Amer 03/30/2018 >60  >60 mL/min Final   Comment: (NOTE) The eGFR has been calculated using the CKD EPI equation. This calculation has not been validated in all clinical situations. eGFR's persistently <60 mL/min signify possible Chronic Kidney Disease.   Georgiann Hahn gap 03/30/2018 9  5 - 15 Final   Performed at [facility], [address] [place], [place] [address]  . WBC  03/30/2018 10.6  3.8 - 10.6 K/uL Final  . RBC 03/30/2018 4.63  4.40 - 5.90 MIL/uL Final  . Hemoglobin 03/30/2018 13.4  13.0 - 18.0 g/dL Final  . HCT 03/30/2018 38.7* 40.0 - 52.0 % Final  . MCV 03/30/2018 83.6  80.0 - 100.0 fL Final  . MCH 03/30/2018 28.8  26.0 - 34.0 pg Final  . MCHC 03/30/2018 34.5  32.0 - 36.0 g/dL Final  . RDW 03/30/2018 22.0* 11.5 - 14.5 % Final  . Platelets 03/30/2018 322  150 - 440 K/uL Final  . Neutrophils Relative % 03/30/2018 78  % Final  . Neutro Abs 03/30/2018 8.3* 1.4 - 6.5 K/uL Final  . Lymphocytes Relative 03/30/2018 10  % Final  . Lymphs Abs 03/30/2018 1.0  1.0 - 3.6 K/uL Final  . Monocytes Relative 03/30/2018 10  % Final  . Monocytes Absolute 03/30/2018 1.1* 0.2 - 1.0 K/uL Final  . Eosinophils Relative 03/30/2018 1  % Final  . Eosinophils Absolute 03/30/2018 0.1  0 - 0.7 K/uL Final  . Basophils Relative 03/30/2018 1  % Final  . Basophils Absolute 03/30/2018 0.1  0 - 0.1 K/uL Final   Performed at [facility] [place], [address] [place], [place] [address]    Assessment:  Ronnie Mcintyre is a 52 y.o. 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  [facility] pathology revealed necrotic tissue. [facility] consultation revealed neoplastic cells are positive for CD20, BCL-6, MUM-1, and BCL-2. The neoplastic cells were negative for CD30, CD3, , CD10, c-MYC, EBV ISH, AE1/3, and PAX-8. Ki67 is 60-70%. 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 The absence of CD30 expression makes primary mediastinal large B-cell lymphoma less likely. FISH for MYC rearrangement was negative. IPI score, age adjusted IPI, are low (1) and NCCN IPI score is low-intermediate (2).  He presented with upper chest fullness with radiation to his neck and arm. He has had B symptoms (sweats and weight loss) worrisome for lymphoma. LDHis 273. Uric acid was normal.  Beta-HCG and AFP were normal on  01/16/2018.  Chest CT angiogramon 01/15/2018 revealed an 11.3 x 8.0 cm anterior mediastinal mass.  Abdomen and pelvic CT on 01/16/2018 revealed a 6.3 x 5.6 cm soft tissue mass or medium density fluid filling the urinary bladder.  There was a 1.5 cm mildly complex lower pole right renal cyst. There was a 1.9 cm indeterminate left adrenal mass.  There was a small pericardial effusion.  Renal ultrasound on 01/25/2018 revealed no evidence of bladder mass.  Finding on prior CT exam likely represented excretion of residual contrast material from a CTA chest exam performed 1 day prior   PET scan on 01/26/2018 revealed an 11.1 cm intensely hypermetabolic and partially necrotic right anterior mediastinal mass, compatible with malignancy. There was diffuse marrow hypermetabolism, nonspecific, cannot exclude an infiltrative neoplastic marrow process. There was no focal skeletal hypermetabolism. There was no focal bone lesions on the CT images.  There was nonspecific mild heterogeneous prostatic hypermetabolism.  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    Bone marrow biopsy on 02/03/2018 revealed a slightly hypercellular bone marrow for age with trilineage hematopoiesis.  There was no monoclonal B-cell population or abnormal T-cell phenotype identified.  Cytogenetics were normal (46, XY).  Echo on 01/15/2018 revealed an EF of 55-60%.  Hepatitis B, hepatitis C, and HIV testing was negative on 02/10/2018.  G6PD assay was normal.  He s/p 2 cycles of RCHOP chemotherapy (02/16/2018 - 03/09/2018) with Neulasta support.  He was diagnosed with T6-T7 herpes zoster on 03/19/2018.  He was treated with valacyclovir.  He is on prophylactic valacyclovir.    Symptomatically, he is doing well.  He denies any B symptoms.  Exam is unremarkable. Potassium is 3.3.  Plan: 1. Labs today:  CBC with diff, CMP, Mg, direct bili. 2. DLBCL - treatment ongoing  Counts  and LFTs are normal.  Begin cycle #3 RCHOP.  Begin 5 day steroid pulse.  Discuss symptom management.  He has antiemetics and pain medications at home to use on a prn bases.  Interventions are adequate.    Restaging chest, abdomen, and pelvic CT on 04/19/2018. 3. Shingles - resolved Herpes zoster crusted over. Treatment doses of valacyclovir complete. Continue prophylactic valacyclovir 500 mg BID.  4. HYPOkalemia - ongoing   Potassium 3.3.  He did not take his oral potassium.  Etiology secondary to HCTZ.  Encourage patient to take his potassium x 3 days. 5. Neuropathy - stable Minimal grade I neuropathy. Continue to observe. 6. Tumor lysis monitoring - stable  No evidence of tumor lysis.  Continue good hydration and allopurinol. 7. RTC tomorrow for Neulasta. 8. RTC on 04/22/2018 for MD assessment, labs (CBC with  diff, CMP, LDH, uric acid), review of imaging, and cycle #4 RCHOP.   Honor Loh, NP  04/01/2018, 11:38 AM  I saw and evaluated the patient, participating in the key portions of the service and reviewing pertinent diagnostic studies and records.  I reviewed the nurse practitioner's note and agree with the findings and the plan.  The assessment and plan were discussed with the patient.  Multiple questions were asked by the patient and answered.   Nolon Stalls, MD 04/01/2018,11:38 AM

## 2018-04-01 NOTE — Progress Notes (Signed)
No new changes noted today 

## 2018-04-02 ENCOUNTER — Inpatient Hospital Stay: Payer: Commercial Managed Care - PPO

## 2018-04-02 DIAGNOSIS — Z5111 Encounter for antineoplastic chemotherapy: Secondary | ICD-10-CM | POA: Diagnosis not present

## 2018-04-02 DIAGNOSIS — C8510 Unspecified B-cell lymphoma, unspecified site: Secondary | ICD-10-CM

## 2018-04-02 MED ORDER — PEGFILGRASTIM INJECTION 6 MG/0.6ML ~~LOC~~
6.0000 mg | PREFILLED_SYRINGE | Freq: Once | SUBCUTANEOUS | Status: AC
  Administered 2018-04-02: 6 mg via SUBCUTANEOUS

## 2018-04-11 DIAGNOSIS — T451X5A Adverse effect of antineoplastic and immunosuppressive drugs, initial encounter: Secondary | ICD-10-CM

## 2018-04-11 DIAGNOSIS — Z5112 Encounter for antineoplastic immunotherapy: Secondary | ICD-10-CM | POA: Insufficient documentation

## 2018-04-11 DIAGNOSIS — G620 Drug-induced polyneuropathy: Secondary | ICD-10-CM | POA: Insufficient documentation

## 2018-04-14 ENCOUNTER — Other Ambulatory Visit: Payer: Self-pay | Admitting: Urgent Care

## 2018-04-14 DIAGNOSIS — C8510 Unspecified B-cell lymphoma, unspecified site: Secondary | ICD-10-CM

## 2018-04-19 ENCOUNTER — Ambulatory Visit
Admission: RE | Admit: 2018-04-19 | Discharge: 2018-04-19 | Disposition: A | Discharge status: Home / Self Care | Payer: Commercial Managed Care - PPO | Source: Ambulatory Visit | Attending: Urgent Care | Admitting: Urgent Care

## 2018-04-19 DIAGNOSIS — N289 Disorder of kidney and ureter, unspecified: Secondary | ICD-10-CM | POA: Insufficient documentation

## 2018-04-19 DIAGNOSIS — C8510 Unspecified B-cell lymphoma, unspecified site: Secondary | ICD-10-CM | POA: Insufficient documentation

## 2018-04-19 DIAGNOSIS — D3502 Benign neoplasm of left adrenal gland: Secondary | ICD-10-CM | POA: Diagnosis not present

## 2018-04-19 DIAGNOSIS — I712 Thoracic aortic aneurysm, without rupture: Secondary | ICD-10-CM | POA: Diagnosis not present

## 2018-04-19 DIAGNOSIS — I2510 Atherosclerotic heart disease of native coronary artery without angina pectoris: Secondary | ICD-10-CM | POA: Insufficient documentation

## 2018-04-19 DIAGNOSIS — R222 Localized swelling, mass and lump, trunk: Secondary | ICD-10-CM | POA: Diagnosis not present

## 2018-04-19 DIAGNOSIS — K429 Umbilical hernia without obstruction or gangrene: Secondary | ICD-10-CM | POA: Insufficient documentation

## 2018-04-19 HISTORY — DX: Malignant (primary) neoplasm, unspecified: C80.1

## 2018-04-19 MED ORDER — IOPAMIDOL (ISOVUE-300) INJECTION 61%
100.0000 mL | Freq: Once | INTRAVENOUS | Status: AC | PRN
  Administered 2018-04-19: 100 mL via INTRAVENOUS

## 2018-04-20 ENCOUNTER — Other Ambulatory Visit: Payer: Self-pay | Admitting: Urgent Care

## 2018-04-20 DIAGNOSIS — Z5111 Encounter for antineoplastic chemotherapy: Secondary | ICD-10-CM

## 2018-04-21 ENCOUNTER — Other Ambulatory Visit: Payer: Self-pay | Admitting: Hematology and Oncology

## 2018-04-22 ENCOUNTER — Ambulatory Visit
Admission: RE | Admit: 2018-04-22 | Discharge: 2018-04-22 | Disposition: A | Discharge status: Home / Self Care | Payer: Commercial Managed Care - PPO | Source: Ambulatory Visit | Attending: Urgent Care | Admitting: Urgent Care

## 2018-04-22 ENCOUNTER — Inpatient Hospital Stay (HOSPITAL_BASED_OUTPATIENT_CLINIC_OR_DEPARTMENT_OTHER): Payer: Commercial Managed Care - PPO | Admitting: Hematology and Oncology

## 2018-04-22 ENCOUNTER — Ambulatory Visit (INDEPENDENT_AMBULATORY_CARE_PROVIDER_SITE_OTHER): Payer: Commercial Managed Care - PPO | Admitting: Cardiovascular Disease

## 2018-04-22 ENCOUNTER — Encounter: Payer: Self-pay | Admitting: Hematology and Oncology

## 2018-04-22 ENCOUNTER — Inpatient Hospital Stay: Payer: Commercial Managed Care - PPO

## 2018-04-22 ENCOUNTER — Encounter: Payer: Self-pay | Admitting: Cardiovascular Disease

## 2018-04-22 ENCOUNTER — Inpatient Hospital Stay: Payer: Commercial Managed Care - PPO | Attending: Hematology and Oncology

## 2018-04-22 VITALS — BP 144/88 | HR 111 | Ht 72.0 in | Wt 254.5 lb

## 2018-04-22 VITALS — BP 120/83 | HR 111 | Temp 98.1°F | Resp 18 | Wt 254.0 lb

## 2018-04-22 DIAGNOSIS — G620 Drug-induced polyneuropathy: Secondary | ICD-10-CM | POA: Insufficient documentation

## 2018-04-22 DIAGNOSIS — I5189 Other ill-defined heart diseases: Secondary | ICD-10-CM | POA: Diagnosis not present

## 2018-04-22 DIAGNOSIS — Z5111 Encounter for antineoplastic chemotherapy: Secondary | ICD-10-CM | POA: Insufficient documentation

## 2018-04-22 DIAGNOSIS — Z5189 Encounter for other specified aftercare: Secondary | ICD-10-CM | POA: Insufficient documentation

## 2018-04-22 DIAGNOSIS — R0602 Shortness of breath: Secondary | ICD-10-CM

## 2018-04-22 DIAGNOSIS — C8510 Unspecified B-cell lymphoma, unspecified site: Secondary | ICD-10-CM

## 2018-04-22 DIAGNOSIS — C8522 Mediastinal (thymic) large B-cell lymphoma, intrathoracic lymph nodes: Secondary | ICD-10-CM

## 2018-04-22 DIAGNOSIS — Z5112 Encounter for antineoplastic immunotherapy: Secondary | ICD-10-CM

## 2018-04-22 DIAGNOSIS — Z8579 Personal history of other malignant neoplasms of lymphoid, hematopoietic and related tissues: Secondary | ICD-10-CM

## 2018-04-22 DIAGNOSIS — I10 Essential (primary) hypertension: Secondary | ICD-10-CM

## 2018-04-22 DIAGNOSIS — R931 Abnormal findings on diagnostic imaging of heart and coronary circulation: Secondary | ICD-10-CM

## 2018-04-22 DIAGNOSIS — R9431 Abnormal electrocardiogram [ECG] [EKG]: Secondary | ICD-10-CM

## 2018-04-22 DIAGNOSIS — Z7189 Other specified counseling: Secondary | ICD-10-CM

## 2018-04-22 DIAGNOSIS — E876 Hypokalemia: Secondary | ICD-10-CM | POA: Insufficient documentation

## 2018-04-22 DIAGNOSIS — E785 Hyperlipidemia, unspecified: Secondary | ICD-10-CM | POA: Diagnosis not present

## 2018-04-22 DIAGNOSIS — T451X5A Adverse effect of antineoplastic and immunosuppressive drugs, initial encounter: Secondary | ICD-10-CM | POA: Diagnosis not present

## 2018-04-22 LAB — CBC WITH DIFFERENTIAL/PLATELET
Basophils Absolute: 0.1 10*3/uL (ref 0–0.1)
Basophils Relative: 1 %
Eosinophils Absolute: 0 10*3/uL (ref 0–0.7)
Eosinophils Relative: 0 %
HCT: 36.5 % — ABNORMAL LOW (ref 40.0–52.0)
Hemoglobin: 12.7 g/dL — ABNORMAL LOW (ref 13.0–18.0)
Lymphocytes Relative: 4 %
Lymphs Abs: 0.4 10*3/uL — ABNORMAL LOW (ref 1.0–3.6)
MCH: 29.9 pg (ref 26.0–34.0)
MCHC: 34.7 g/dL (ref 32.0–36.0)
MCV: 86.1 fL (ref 80.0–100.0)
Monocytes Absolute: 0.2 10*3/uL (ref 0.2–1.0)
Monocytes Relative: 1 %
Neutro Abs: 10.5 10*3/uL — ABNORMAL HIGH (ref 1.4–6.5)
Neutrophils Relative %: 94 %
Platelets: 413 10*3/uL (ref 150–440)
RBC: 4.24 MIL/uL — ABNORMAL LOW (ref 4.40–5.90)
RDW: 23.1 % — ABNORMAL HIGH (ref 11.5–14.5)
WBC: 11.2 10*3/uL — ABNORMAL HIGH (ref 3.8–10.6)

## 2018-04-22 LAB — COMPREHENSIVE METABOLIC PANEL
ALT: 33 U/L (ref 0–44)
AST: 24 U/L (ref 15–41)
Albumin: 4.2 g/dL (ref 3.5–5.0)
Alkaline Phosphatase: 71 U/L (ref 38–126)
Anion gap: 10 (ref 5–15)
BUN: 16 mg/dL (ref 6–20)
CO2: 26 mmol/L (ref 22–32)
Calcium: 9.5 mg/dL (ref 8.9–10.3)
Chloride: 102 mmol/L (ref 98–111)
Creatinine, Ser: 1.11 mg/dL (ref 0.61–1.24)
GFR calc Af Amer: >60 mL/min (ref >60)
GFR calc non Af Amer: >60 mL/min (ref >60)
Glucose, Bld: 143 mg/dL — ABNORMAL HIGH (ref 70–99)
Potassium: 3.3 mmol/L — ABNORMAL LOW (ref 3.5–5.1)
Sodium: 138 mmol/L (ref 135–145)
Total Bilirubin: 1.1 mg/dL (ref 0.3–1.2)
Total Protein: 7.2 g/dL (ref 6.5–8.1)

## 2018-04-22 LAB — MAGNESIUM: Magnesium: 1.9 mg/dL (ref 1.7–2.4)

## 2018-04-22 LAB — BILIRUBIN, DIRECT: Bilirubin, Direct: 0.1 mg/dL (ref 0.0–0.2)

## 2018-04-22 LAB — URIC ACID: Uric Acid, Serum: 4.9 mg/dL (ref 3.7–8.6)

## 2018-04-22 MED ORDER — PERFLUTREN LIPID MICROSPHERE
1.0000 mL | INTRAVENOUS | Status: AC | PRN
  Administered 2018-04-22: 2 mL via INTRAVENOUS
  Filled 2018-04-22: qty 10

## 2018-04-22 MED ORDER — SODIUM CHLORIDE 0.9% FLUSH
10.0000 mL | Freq: Once | INTRAVENOUS | Status: AC
  Administered 2018-04-22: 10 mL via INTRAVENOUS
  Filled 2018-04-22: qty 10

## 2018-04-22 MED ORDER — POTASSIUM CHLORIDE CRYS ER 20 MEQ PO TBCR: EXTENDED_RELEASE_TABLET | ORAL | 0 refills | Status: DC

## 2018-04-22 MED ORDER — HEPARIN SOD (PORK) LOCK FLUSH 100 UNIT/ML IV SOLN
500.0000 [IU] | Freq: Once | INTRAVENOUS | Status: AC
  Administered 2018-04-22: 500 [IU] via INTRAVENOUS
  Filled 2018-04-22: qty 5

## 2018-04-22 NOTE — Progress Notes (Signed)
Pt in for follow up, denies any difficulties. Is anxious over echocardiogram scheduled this and has questions for MD.

## 2018-04-22 NOTE — Progress Notes (Signed)
[facility] day:  04/22/2018   Chief Complaint: Ronnie Mcintyre is a 52 y.o. male with a large B-cell lymphoma who is seen for review of interval CT and assessment prior to cycle #4 RCHOP chemotherapy.  HPI:  The patient was last seen in the medical oncology clinic on 04/01/2018.  At that time, he was doing well.  He denied any B symptoms.  Exam was unremarkable. Potassium was 3.3.  He received cycle #3 RCHOP chemotherapy with Neulasta support.  Chest, abdomen, and pelvic CT on 04/19/2018 revealed the anterior mediastinal mass had significantly reduced in size (10.1 x 6.9 cm to 6.8 by 4.5 cm).  The smaller left eccentric mediastinal lymph node measured 1.4 x 4.7 cm, previously 2.0 x 3.2 cm.  There was no appreciable adenopathy in the abdomen/pelvis.  There were bilateral hypodense renal lesions technically too small to characterize, including the right kidney lower pole lesion.  Renal protocol MRI with and without contrast could be utilized for definitive characterization. Ascending aortic aneurysm 4.3 cm in diameter. Recommend annual imaging.  Echo on 04/22/2018 revealed mild concentric hypertrophy.  Ejection fraction was 50 to 55%. There was evidence of probable severe hypokinesis of the mid apical-anteroseptal, anterior, and apical myocardium. Study discussed with Dr. [name], who offers that patient may have suffered a silent MI. Plans are for patient to be seen for immediate consult by cardiology, at which time EST will be discussed.   During the interim, the patient has been doing well overall.  He denies any acute symptoms today. No chest pain, shortness of breath, or palpitations. Patient denies symptoms of SVC syndrome. Patient denies that he has experienced any B symptoms. He denies any interval infections.   Patient continues to have difficulties sleeping due to both anxiety and the use of pre-treatment systemic steroids. 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 Patient advises that he maintains an adequate appetite. He is eating well. Weight today is 254 lb (115.2 kg), which compared to his last visit to the clinic, represents a 3 pound increase.     Patient denies pain in the clinic today.   Past Medical History:  Diagnosis Date  . Cancer ([place])   . Hypertension     Past Surgical History:  Procedure Laterality Date  . ANKLE SURGERY Right   . PORTACATH PLACEMENT Right 02/11/2018   Procedure: INSERTION PORT-A-CATH;  Surgeon: Jules Husbands, MD;  Location: [facility] ORS;  Service: General;  Laterality: Right;    Family History  Problem Relation Age of Onset  . Hypertension Mother   . Hypertension Father   . Multiple myeloma Father     Social History:  reports that he has never smoked. He has never used smokeless tobacco. He reports that he drinks alcohol. He reports that he does not use drugs.  He drinks a beer 1-2 x/week.  He works for [employer].  He lives in [place].  His is alone today.  Allergies: No Known Allergies  Current Medications: Current Outpatient Medications  Medication Sig Dispense Refill  . allopurinol (ZYLOPRIM) 300 MG tablet Take 1 tablet (300 mg total) by mouth daily. 90 tablet 1  . aspirin 81 MG tablet Take 81 mg by mouth daily.     . felodipine (PLENDIL) 5 MG 24 hr tablet Take 5 mg by mouth daily.    . hydrocortisone 1 % lotion Apply 1  application topically 2 (two) times daily as needed for itching.    . lidocaine-prilocaine (EMLA) cream Apply to affected area once 30 g 3  . loratadine (CLARITIN) 10 MG tablet Take 10 mg by mouth daily as needed for allergies.     [name] LORazepam (ATIVAN) 0.5 MG tablet Take 1 tablet (0.5 mg total) by mouth every 6 (six) hours as needed (Nausea or vomiting). 30 tablet 0  . losartan-hydrochlorothiazide (HYZAAR) 100-25 MG tablet Take 1 tablet by mouth daily.    . ondansetron (ZOFRAN) 8 MG tablet Take  1 tablet (8 mg total) by mouth 2 (two) times daily as needed. Start on day 3 after cyclophosphamide chemotherapy. 30 tablet 1  . pantoprazole (PROTONIX) 40 MG tablet TAKE 1 TABLET (40 MG TOTAL) BY MOUTH DAILY. USE DAILY WHILE ON PREDNISONE FOR GI PROPHYLAXIS. 30 tablet 1  . predniSONE (DELTASONE) 50 MG tablet TAKE 2 TABLETS (100 MG TOTAL) BY MOUTH DAILY. TAKE ON DAYS 1-5 OF CHEMOTHERAPY. 60 tablet 1  . simvastatin (ZOCOR) 40 MG tablet Take 40 mg by mouth daily.    . valACYclovir (VALTREX) 500 MG tablet Take 1 tablet (500 mg total) by mouth 2 (two) times daily. 60 tablet 0  . acetaminophen (TYLENOL) 325 MG tablet Take 2 tablets (650 mg total) by mouth every 6 (six) hours as needed for mild pain (or Fever >/= 101). (Patient not taking: Reported on 04/22/2018)    . fluticasone (FLONASE) 50 MCG/ACT nasal spray Place 2 sprays into both nostrils daily. (Patient not taking: Reported on 04/01/2018) 16 g 0  . oxyCODONE (OXY IR/ROXICODONE) 5 MG immediate release tablet Take 1 tablet (5 mg total) by mouth every 6 (six) hours as needed for moderate pain. (Patient not taking: Reported on 03/09/2018) 30 tablet 0  . potassium chloride SA (K-DUR,KLOR-CON) 20 MEQ tablet Take 1 tablet (20 mEq) daily x 3 days. Reserve additional tabs for use as directed by oncology. (Patient not taking: Reported on 03/30/2018) 6 tablet 0   No current facility-administered medications for this visit.    Facility-Administered Medications Ordered in Other Visits  Medication Dose Route Frequency Provider Last Rate Last Dose  . heparin lock flush 100 unit/mL  500 Units Intravenous Once Corcoran, Melissa C, MD      . perflutren lipid microspheres (DEFINITY) IV suspension  1-10 mL Intravenous PRN Karen Kitchens, NP   2 mL at 04/22/18 0923    Review of Systems  Constitutional: Negative for diaphoresis, fever, malaise/fatigue and weight loss (up 3 pounds).       "I am feeling well. I don't have any complaints today".   HENT: Negative.   Eyes:  Negative.   Respiratory: Negative for cough, hemoptysis, sputum production and shortness of breath.   Cardiovascular: Negative for chest pain, palpitations, orthopnea, leg swelling and PND.       Slight decrease in EF to 50-55%. ?? recent silent MI.  Gastrointestinal: Negative for abdominal pain, blood in stool, constipation, diarrhea, melena, nausea and vomiting.  Genitourinary: Negative for dysuria, frequency, hematuria and urgency.  Musculoskeletal: Negative for back pain, falls, joint pain and myalgias.  Skin: Negative for itching and rash.  Neurological: Positive for sensory change (stable neuropathy to fingertips). Negative for dizziness, tremors, weakness and headaches.  Endo/Heme/Allergies: Does not bruise/bleed easily.  Psychiatric/Behavioral: Negative for depression, memory loss and suicidal ideas. The patient is nervous/anxious and has insomnia (anxiety and steroids).   All other systems reviewed and are negative.  Performance status (ECOG): 1 - Symptomatic but  completely ambulatory  Vital Signs BP 120/83 (BP Location: Left Arm, Patient Position: Sitting)   Pulse (!) 111   Temp 98.1 F (36.7 C) (Tympanic)   Resp 18   Wt 254 lb (115.2 kg)   BMI 34.45 kg/m   Physical Exam  Constitutional: He is oriented to person, place, and time and well-developed, well-nourished, and in no distress.  HENT:  Head: Normocephalic and atraumatic.  Near alopecia.  Eyes: Pupils are equal, round, and reactive to light. EOM are normal. No scleral icterus.  Blue eyes  Neck: Normal range of motion. Neck supple. No tracheal deviation present. No thyromegaly present.  Cardiovascular: Normal rate, regular rhythm and normal heart sounds. Exam reveals no gallop and no friction rub.  No murmur heard. Pulmonary/Chest: Effort normal and breath sounds normal. No respiratory distress. He has no wheezes. He has no rales.  Abdominal: Soft. Bowel sounds are normal. He exhibits no distension. There is no  tenderness.  Musculoskeletal: Normal range of motion. He exhibits no edema or tenderness.  Lymphadenopathy:    He has no cervical adenopathy.    He has no axillary adenopathy.       Right: No inguinal and no supraclavicular adenopathy present.       Left: No inguinal and no supraclavicular adenopathy present.  Neurological: He is alert and oriented to person, place, and time.  Skin: Skin is warm and dry. No rash noted. No erythema.  Area of residual hyperpigmentation from previous zoster infection.  Psychiatric: Mood, affect and judgment normal.  Nursing note and vitals reviewed.   Infusion on 04/22/2018  Component Date Value Ref Range Status  . Bilirubin, Direct 04/22/2018 0.1  0.0 - 0.2 mg/dL Final   Performed at [facility], [address] [place], [place] [address]  . Magnesium 04/22/2018 1.9  1.7 - 2.4 mg/dL Final   Performed at [facility], [address] [place], [place] [address]  . Sodium 04/22/2018 138  135 - 145 mmol/L Final  . Potassium 04/22/2018 3.3* 3.5 - 5.1 mmol/L Final  . Chloride 04/22/2018 102  98 - 111 mmol/L Final  . CO2 04/22/2018 26  22 - 32 mmol/L Final  . Glucose, Bld 04/22/2018 143* 70 - 99 mg/dL Final  . BUN 04/22/2018 16  6 - 20 mg/dL Final  . Creatinine, Ser 04/22/2018 1.11  0.61 - 1.24 mg/dL Final  . Calcium 04/22/2018 9.5  8.9 - 10.3 mg/dL Final  . Total Protein 04/22/2018 7.2  6.5 - 8.1 g/dL Final  . Albumin 04/22/2018 4.2  3.5 - 5.0 g/dL Final  . AST 04/22/2018 24  15 - 41 U/L Final  . ALT 04/22/2018 33  0 - 44 U/L Final  . Alkaline Phosphatase 04/22/2018 71  38 - 126 U/L Final  . Total Bilirubin 04/22/2018 1.1  0.3 - 1.2 mg/dL Final  . GFR calc non Af Amer 04/22/2018 >60  >60 mL/min Final  . GFR calc Af Amer 04/22/2018 >60  >60 mL/min Final   Comment: (NOTE) The eGFR has been calculated using the CKD EPI equation. This calculation has not been validated in all clinical situations. eGFR's persistently <60 mL/min signify  possible Chronic Kidney Disease.   Georgiann Hahn gap 04/22/2018 10  5 - 15 Final   Performed at [facility], [address] [place], [place] [address]  . WBC 04/22/2018 11.2* 3.8 - 10.6 K/uL Final  . RBC 04/22/2018 4.24* 4.40 - 5.90 MIL/uL Final  . Hemoglobin 04/22/2018 12.7* 13.0 -  18.0 g/dL Final  . HCT 04/22/2018 36.5* 40.0 - 52.0 % Final  . MCV 04/22/2018 86.1  80.0 - 100.0 fL Final  . MCH 04/22/2018 29.9  26.0 - 34.0 pg Final  . MCHC 04/22/2018 34.7  32.0 - 36.0 g/dL Final  . RDW 04/22/2018 23.1* 11.5 - 14.5 % Final  . Platelets 04/22/2018 413  150 - 440 K/uL Final  . Neutrophils Relative % 04/22/2018 94  % Final  . Neutro Abs 04/22/2018 10.5* 1.4 - 6.5 K/uL Final  . Lymphocytes Relative 04/22/2018 4  % Final  . Lymphs Abs 04/22/2018 0.4* 1.0 - 3.6 K/uL Final  . Monocytes Relative 04/22/2018 1  % Final  . Monocytes Absolute 04/22/2018 0.2  0.2 - 1.0 K/uL Final  . Eosinophils Relative 04/22/2018 0  % Final  . Eosinophils Absolute 04/22/2018 0.0  0 - 0.7 K/uL Final  . Basophils Relative 04/22/2018 1  % Final  . Basophils Absolute 04/22/2018 0.1  0 - 0.1 K/uL Final   Performed at [facility], [address] [place],  [address]    Assessment:  Ronnie Mcintyre is a 52 y.o. 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  [facility] pathology revealed necrotic tissue. [facility] consultation revealed neoplastic cells are positive for CD20, BCL-6, MUM-1, and BCL-2. The neoplastic cells were negative for CD30, CD3, , CD10, c-MYC, EBV ISH, AE1/3, and PAX-8. Ki67 is 60-70%. 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 The absence of CD30 expression makes primary mediastinal large B-cell lymphoma less likely. FISH for MYC rearrangement was negative. IPI score, age adjusted IPI, are low (1) and NCCN IPI score is low-intermediate (2).  He presented with upper chest fullness with radiation to  his neck and arm. He has had B symptoms (sweats and weight loss) worrisome for lymphoma. LDHis 273. Uric acid was normal.  Beta-HCG and AFP were normal on 01/16/2018.  Chest CT angiogramon 01/15/2018 revealed an 11.3 x 8.0 cm anterior mediastinal mass.  Abdomen and pelvic CT on 01/16/2018 revealed a 6.3 x 5.6 cm soft tissue mass or medium density fluid filling the urinary bladder.  There was a 1.5 cm mildly complex lower pole right renal cyst. There was a 1.9 cm indeterminate left adrenal mass.  There was a small pericardial effusion.  Renal ultrasound on 01/25/2018 revealed no evidence of bladder mass.  Finding on prior CT exam likely represented excretion of residual contrast material from a CTA chest exam performed 1 day prior   PET scan on 01/26/2018 revealed an 11.1 cm intensely hypermetabolic and partially necrotic right anterior mediastinal mass, compatible with malignancy. There was diffuse marrow hypermetabolism, nonspecific, cannot exclude an infiltrative neoplastic marrow process. There was no focal skeletal hypermetabolism. There was no focal bone lesions on the CT images.  There was nonspecific mild heterogeneous prostatic hypermetabolism.  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    Bone marrow biopsy on 02/03/2018 revealed a slightly hypercellular bone marrow for age with trilineage hematopoiesis.  There was no monoclonal B-cell population or abnormal T-cell phenotype identified.  Cytogenetics were normal (46, XY).  Echo on 01/15/2018 revealed an EF of 55-60%.  Echo on 04/22/2018 revealed an EF of 50 to 55%. There was evidence of probable severe hypokinesis of the mid apical-anteroseptal, anterior, and apical myocardium.  Hepatitis B, hepatitis C, and HIV testing was negative on 02/10/2018.  G6PD assay was normal.  He s/p 3 cycles of RCHOP chemotherapy (02/16/2018 - 04/01/2018) with Neulasta support.  Chest, abdomen,  and pelvic CT on 04/19/2018 revealed the anterior mediastinal mass had significantly reduced in size (10.1 x 6.9 cm to 6.8 by 4.5 cm).  The smaller left eccentric mediastinal lymph node measured 1.4 x 4.7 cm, previously 2.0 x 3.2 cm.  There was no appreciable adenopathy in the abdomen/pelvis.  There were bilateral hypodense renal lesions technically too small to characterize, including the right kidney lower pole lesion.   He was diagnosed with T6-T7 herpes zoster on 03/19/2018.  He was treated with valacyclovir.  He is on prophylactic valacyclovir.    Symptomatically, he is doing well. He denies any B symptoms.  He denies any interval chest pain.  Exam is unremarkable. Potassium low at 3.3.   Plan: 1. Labs today:  CBC with diff, CMP, LDH, uric acid. 2. DLBCL  Doing well overall. Tolerating treatment with minimal side effects.   Labs reviewed. Blood counts stable and adequate enough for treatment, however given echocardiogram results, will hold cycle #4 RCHOP. Discuss symptom management.  Patient has antiemetics and pain medications at home to use on a PRN basis. Patient  advising that the  prescribed interventions are adequate at this point. Continue all medications as previously prescribed.   3. Cardiac wall motion abnormalities  Echocardiogram today reveals a decrease in EF to 50-55%.  Severe hypokinesis was noted which was not present on previous exam.   Consulted Dr. Fletcher Anon (cardiology) via phone while patient in clinic.   Concern for silent MI.   STAT consult needed to discuss EST to further assess.   Appointment secured for this afternoon at 1520. 4. Neuropathy  Stable and not limiting day to day function.   No intervention at this time.   Continue to monitor for progression.  5. Shingles  Resolved.  Continue prophylactic valacyclovir 500 mg BID.  6. HYPOkalemia  Persistently low potassium. Level 3.3 today.  New Rx sent in for oral KCl 20 mEq x 3 days (Disp #12).  Again, extra supply was sent for patient to hold on to for future use, as this is an ongoing issue.   Continue routine lab monitoring.  7. Tumor lysis monitoring  No evidence of TLS. Labs normal.   Continue good hydration and prophylactic allopurinol dose.  8. RTC on 04/29/2018 for MD assessment, labs (CBC with diff, CMP, LDH, uric acid), and cycle #4 RCHOP   Honor Loh, NP  04/22/2018, 10:44 AM  I saw and evaluated the patient, participating in the key portions of the service and reviewing pertinent diagnostic studies and records.  I reviewed the nurse practitioner's note and agree with the findings and the plan.  The assessment and plan were discussed with the patient.  Multiple questions were asked by the patient and answered.   Nolon Stalls, MD 04/22/2018,10:44 AM

## 2018-04-22 NOTE — Progress Notes (Signed)
Per Rodena Piety RN per Dr. Mike Gip no treatment at this time.

## 2018-04-22 NOTE — Progress Notes (Signed)
*  PRELIMINARY RESULTS* Echocardiogram 2D Echocardiogram has been performed.  Ronnie Mcintyre 04/22/2018, 9:45 AM

## 2018-04-22 NOTE — Patient Instructions (Addendum)
Medication Instructions: Your physician recommends that you continue on your current medications as directed. Please refer to the Current Medication list given to you today.  If you need a refill on your cardiac medications before your next appointment, please call your pharmacy.   Procedures/Testing: Your physician has requested that you have an exercise stress myoview. For further information please visit [email or web address]. Please follow instruction sheet, as given.  Follow-Up: Your physician anbwants you to follow-up as needed with Dr. Fletcher Anon.   Thank you for choosing [facility] at [facility]!     [place]  Your provider has ordered a Stress Test with nuclear imaging. The purpose of this test is to evaluate the blood supply to your heart muscle. This procedure is referred to as a "Non-Invasive Stress Test." This is because other than having an IV started in your vein, nothing is inserted or "invades" your body. Cardiac stress tests are done to find areas of poor blood flow to the heart by determining the extent of coronary artery disease (CAD). Some patients exercise on a treadmill, which naturally increases the blood flow to your heart, while others who are unable to walk on a treadmill due to physical limitations have a pharmacologic/chemical stress agent called Lexiscan . This medicine will mimic walking on a treadmill by temporarily increasing your coronary blood flow.   Please note: these test may take anywhere between 2-4 hours to complete  PLEASE REPORT TO [facility] AT THE FIRST DESK WILL DIRECT YOU WHERE TO GO  Date of Procedure:_____________________________________  Arrival Time for Procedure:______________________________  Instructions regarding medication:  None to hold  PLEASE NOTIFY THE OFFICE AT LEAST 24 HOURS IN ADVANCE IF YOU ARE UNABLE TO KEEP YOUR APPOINTMENT.  [phone] AND  PLEASE NOTIFY NUCLEAR MEDICINE AT [facility] AT  LEAST 24 HOURS IN ADVANCE IF YOU ARE UNABLE TO KEEP YOUR APPOINTMENT. [phone]  How to prepare for your Myoview test:  1. Do not eat or drink after midnight 2. No caffeine for 24 hours prior to test 3. No smoking 24 hours prior to test. 4. Your medication may be taken with water.  If your doctor stopped a medication because of this test, do not take that medication. 5. Ladies, please do not wear dresses.  Skirts or pants are appropriate. Please wear a short sleeve shirt. 6. No perfume, cologne or lotion. 7. Wear comfortable walking shoes. No heels!

## 2018-04-22 NOTE — Progress Notes (Signed)
Cardiology Office Note   Date:  04/22/2018   ID:  Ronnie Mcintyre, DOB Dec 31, 1965, MRN [id number]  PCP:  [name], MD  Cardiologist:   [name], MD   Chief Complaint  Patient presents with  . other    Echo f/u. Medications reviewed verbally.       History of Present Illness: Ronnie Mcintyre is a 52 y.o. male who was referred by Dr. Mike Gip for evaluation of an abnormal echocardiogram.  The patient has known history of obesity, hypertension and hyperlipidemia.  He has no prior cardiac history.  He is not a smoker or diabetic.  He has no family history of coronary artery disease. 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  He had 3 cycles so Ronnie Mcintyre you to get the for today.  He had an echocardiogram done in June 2019 before chemotherapy which showed an EF of 55 to 60% with no wall motion abnormalities and mildly dilated aortic root at 40 mmHg.  He had a repeat echocardiogram today which was overall a difficult study and we had to use Definity.  However, I did show an EF of 50 to 55% with probable severe hypokinesis of the mid to distal anteroseptal, anterior and apical myocardium.  The patient reports having chest pain in June when he was diagnosed with cancer but none since then.  He has mild exertional dyspnea.  He has improved his lifestyle since his diagnosis of cancer.     Past Medical History:  Diagnosis Date  . Cancer ([place])   . Hypertension     Past Surgical History:  Procedure Laterality Date  . ANKLE SURGERY Right   . PORTACATH PLACEMENT Right 02/11/2018   Procedure: INSERTION PORT-A-CATH;  Surgeon: Jules Husbands, MD;  Location: [facility] ORS;  Service: General;  Laterality: Right;     Current Outpatient Medications  Medication Sig Dispense Refill  . acetaminophen (TYLENOL) 325 MG tablet Take 2 tablets (650 mg total) by mouth every 6 (six) hours as needed for mild pain (or Fever >/= 101).    [name] allopurinol  (ZYLOPRIM) 300 MG tablet Take 1 tablet (300 mg total) by mouth daily. 90 tablet 1  . aspirin 81 MG tablet Take 81 mg by mouth daily.     . felodipine (PLENDIL) 5 MG 24 hr tablet Take 5 mg by mouth daily.    . fluticasone (FLONASE) 50 MCG/ACT nasal spray Place 2 sprays into both nostrils daily. 16 g 0  . hydrocortisone 1 % lotion Apply 1 application topically 2 (two) times daily as needed for itching.    . lidocaine-prilocaine (EMLA) cream Apply to affected area once 30 g 3  . loratadine (CLARITIN) 10 MG tablet Take 10 mg by mouth daily as needed for allergies.     [name] LORazepam (ATIVAN) 0.5 MG tablet Take 1 tablet (0.5 mg total) by mouth every 6 (six) hours as needed (Nausea or vomiting). 30 tablet 0  . losartan-hydrochlorothiazide (HYZAAR) 100-25 MG tablet Take 1 tablet by mouth daily.    . ondansetron (ZOFRAN) 8 MG tablet Take 1 tablet (8 mg total) by mouth 2 (two) times daily as needed. Start on day 3 after cyclophosphamide chemotherapy. 30 tablet 1  . oxyCODONE (OXY IR/ROXICODONE) 5 MG immediate release tablet Take 1 tablet (5 mg total) by mouth every 6 (six) hours as needed for moderate pain. 30 tablet 0  . pantoprazole (PROTONIX) 40 MG tablet TAKE 1 TABLET (40 MG  TOTAL) BY MOUTH DAILY. USE DAILY WHILE ON PREDNISONE FOR GI PROPHYLAXIS. 30 tablet 1  . potassium chloride SA (K-DUR,KLOR-CON) 20 MEQ tablet Take 1 tablet (20 mEq) daily x 3 days. Reserve additional tabs for use as directed by oncology. 12 tablet 0  . predniSONE (DELTASONE) 50 MG tablet TAKE 2 TABLETS (100 MG TOTAL) BY MOUTH DAILY. TAKE ON DAYS 1-5 OF CHEMOTHERAPY. 60 tablet 1  . simvastatin (ZOCOR) 40 MG tablet Take 40 mg by mouth daily.    . valACYclovir (VALTREX) 500 MG tablet Take 1 tablet (500 mg total) by mouth 2 (two) times daily. 60 tablet 0   No current facility-administered medications for this visit.     Allergies:   Patient has no known allergies.    Social History:  The patient  reports that he has never smoked. He  has never used smokeless tobacco. He reports that he drinks alcohol. He reports that he does not use drugs.   Family History:  The patient's family history includes Hypertension in his father and mother; Multiple myeloma in his father.    ROS:  Please see the history of present illness.   Otherwise, review of systems are positive for none.   All other systems are reviewed and negative.    PHYSICAL EXAM: VS:  BP (!) 144/88 (BP Location: Right Arm, Patient Position: Sitting, Cuff Size: Normal)   Pulse (!) 111   Ht 6' (1.829 m)   Wt 254 lb 8 oz (115.4 kg)   BMI 34.52 kg/m  , BMI Body mass index is 34.52 kg/m. GEN: Well nourished, well developed, in no acute distress  HEENT: normal  Neck: no JVD, carotid bruits, or masses Cardiac: RRR; no murmurs, rubs, or gallops,no edema  Respiratory:  clear to auscultation bilaterally, normal work of breathing GI: soft, nontender, nondistended, + BS MS: no deformity or atrophy  Skin: warm and dry, no rash Neuro:  Strength and sensation are intact Psych: euthymic mood, full affect   EKG:  EKG is ordered today. The ekg ordered today demonstrates sinus tachycardia with left atrial enlargement, left anterior fascicular block versus an anterior septal infarct pattern.   Recent Labs: 04/22/2018: ALT 33; BUN 16; Creatinine, Ser 1.11; Hemoglobin 12.7; Magnesium 1.9; Platelets 413; Potassium 3.3; Sodium 138    Lipid Panel No results found for: CHOL, TRIG, HDL, CHOLHDL, VLDL, LDLCALC, LDLDIRECT    Wt Readings from Last 3 Encounters:  04/22/18 254 lb 8 oz (115.4 kg)  04/22/18 254 lb (115.2 kg)  04/01/18 251 lb 4.8 oz (114 kg)      PAD Screen 04/22/2018  Previous PAD dx? No  Previous surgical procedure? No  Pain with walking? No  Feet/toe relief with dangling? No  Painful, non-healing ulcers? No  Extremities discolored? No      ASSESSMENT AND PLAN:  1.  Abnormal EKG and echocardiogram with mild exertional dyspnea: The echo was done as  routine surveillance as he is receiving chemotherapy for lymphoma.  The drop in ejection fraction is not significant enough to stop his chemotherapy.  However, it is hard to explain the new wall motion abnormality on echocardiogram.  Due to that, I recommend evaluation with a treadmill Myoview.  This will be expedited.  If this does not show any perfusion defects or significant ischemia, the patient can resume chemotherapy next week.  2.  Essential hypertension: Blood pressure is reasonably controlled.  He has been mildly tachycardic recently.  Continue to monitor.  3.  Hyperlipidemia: Currently on simvastatin.  Disposition:   FU with me as needed.   Signed,  [name], MD  04/22/2018 3:35 PM    [place]

## 2018-04-23 NOTE — Addendum Note (Signed)
Addended by: Alba Destine on: 04/23/2018 03:42 PM   Modules accepted: Orders

## 2018-04-25 DIAGNOSIS — R931 Abnormal findings on diagnostic imaging of heart and coronary circulation: Secondary | ICD-10-CM | POA: Insufficient documentation

## 2018-04-25 DIAGNOSIS — Z7189 Other specified counseling: Secondary | ICD-10-CM | POA: Insufficient documentation

## 2018-04-26 ENCOUNTER — Encounter
Admission: RE | Admit: 2018-04-26 | Discharge: 2018-04-26 | Disposition: A | Discharge status: Home / Self Care | Payer: Commercial Managed Care - PPO | Source: Ambulatory Visit | Attending: Cardiovascular Disease | Admitting: Cardiovascular Disease

## 2018-04-26 DIAGNOSIS — R0602 Shortness of breath: Secondary | ICD-10-CM | POA: Diagnosis present

## 2018-04-26 LAB — NM MYOCAR MULTI W/SPECT W/WALL MOTION / EF
CHL CUP NUCLEAR SRS: 5
CHL CUP NUCLEAR SSS: 6
CHL CUP RESTING HR STRESS: 101 {beats}/min
CSEPEW: 6.7 METS
CSEPHR: 92 %
Exercise duration (min): 4 min
Exercise duration (sec): 45 s
LV dias vol: 79 mL (ref 62–150)
LV sys vol: 29 mL
MPHR: 170 {beats}/min
Peak HR: 157 {beats}/min
SDS: 2
TID: 1.06

## 2018-04-26 MED ORDER — TECHNETIUM TC 99M TETROFOSMIN IV KIT
10.3900 | PACK | Freq: Once | INTRAVENOUS | Status: AC | PRN
  Administered 2018-04-26: 10.39 via INTRAVENOUS

## 2018-04-26 MED ORDER — TECHNETIUM TC 99M TETROFOSMIN IV KIT
30.7300 | PACK | Freq: Once | INTRAVENOUS | Status: AC | PRN
  Administered 2018-04-26: 30.73 via INTRAVENOUS

## 2018-04-28 ENCOUNTER — Telehealth: Payer: Self-pay | Admitting: Cardiovascular Disease

## 2018-04-28 NOTE — Telephone Encounter (Signed)
Patient calling to discuss recent testing results  ° °Please call  ° °

## 2018-04-28 NOTE — Telephone Encounter (Signed)
See result note.  

## 2018-04-28 NOTE — Telephone Encounter (Signed)
Dr. Fletcher Anon,  Patient calling for results. Please review.  Thanks!

## 2018-04-29 ENCOUNTER — Encounter: Payer: Self-pay | Admitting: Hematology and Oncology

## 2018-04-29 ENCOUNTER — Other Ambulatory Visit: Payer: Self-pay

## 2018-04-29 ENCOUNTER — Inpatient Hospital Stay: Payer: Commercial Managed Care - PPO

## 2018-04-29 ENCOUNTER — Inpatient Hospital Stay (HOSPITAL_BASED_OUTPATIENT_CLINIC_OR_DEPARTMENT_OTHER): Payer: Commercial Managed Care - PPO | Admitting: Hematology and Oncology

## 2018-04-29 VITALS — BP 107/74 | HR 108 | Temp 97.7°F | Resp 18 | Ht 72.0 in | Wt 254.1 lb

## 2018-04-29 DIAGNOSIS — Z5111 Encounter for antineoplastic chemotherapy: Secondary | ICD-10-CM

## 2018-04-29 DIAGNOSIS — I10 Essential (primary) hypertension: Secondary | ICD-10-CM

## 2018-04-29 DIAGNOSIS — I5189 Other ill-defined heart diseases: Secondary | ICD-10-CM

## 2018-04-29 DIAGNOSIS — Z5112 Encounter for antineoplastic immunotherapy: Secondary | ICD-10-CM

## 2018-04-29 DIAGNOSIS — R931 Abnormal findings on diagnostic imaging of heart and coronary circulation: Secondary | ICD-10-CM

## 2018-04-29 DIAGNOSIS — C8522 Mediastinal (thymic) large B-cell lymphoma, intrathoracic lymph nodes: Secondary | ICD-10-CM

## 2018-04-29 DIAGNOSIS — Z8579 Personal history of other malignant neoplasms of lymphoid, hematopoietic and related tissues: Secondary | ICD-10-CM

## 2018-04-29 DIAGNOSIS — C8510 Unspecified B-cell lymphoma, unspecified site: Secondary | ICD-10-CM

## 2018-04-29 DIAGNOSIS — T451X5A Adverse effect of antineoplastic and immunosuppressive drugs, initial encounter: Secondary | ICD-10-CM

## 2018-04-29 DIAGNOSIS — E876 Hypokalemia: Secondary | ICD-10-CM

## 2018-04-29 DIAGNOSIS — G620 Drug-induced polyneuropathy: Secondary | ICD-10-CM | POA: Diagnosis not present

## 2018-04-29 DIAGNOSIS — R0602 Shortness of breath: Secondary | ICD-10-CM

## 2018-04-29 LAB — COMPREHENSIVE METABOLIC PANEL
ALT: 28 U/L (ref 0–44)
AST: 22 U/L (ref 15–41)
Albumin: 4 g/dL (ref 3.5–5.0)
Alkaline Phosphatase: 60 U/L (ref 38–126)
Anion gap: 8 (ref 5–15)
BUN: 15 mg/dL (ref 6–20)
CO2: 28 mmol/L (ref 22–32)
Calcium: 9.4 mg/dL (ref 8.9–10.3)
Chloride: 103 mmol/L (ref 98–111)
Creatinine, Ser: 1.15 mg/dL (ref 0.61–1.24)
GFR calc Af Amer: >60 mL/min (ref >60)
GFR calc non Af Amer: >60 mL/min (ref >60)
Glucose, Bld: 125 mg/dL — ABNORMAL HIGH (ref 70–99)
Potassium: 3.1 mmol/L — ABNORMAL LOW (ref 3.5–5.1)
Sodium: 139 mmol/L (ref 135–145)
Total Bilirubin: 1 mg/dL (ref 0.3–1.2)
Total Protein: 6.6 g/dL (ref 6.5–8.1)

## 2018-04-29 LAB — CBC WITH DIFFERENTIAL/PLATELET
Abs Immature Granulocytes: 0.03 10*3/uL (ref 0.00–0.07)
Basophils Absolute: 0 10*3/uL (ref 0.0–0.1)
Basophils Relative: 1 %
Eosinophils Absolute: 0.1 10*3/uL (ref 0.0–0.5)
Eosinophils Relative: 1 %
HCT: 36.5 % — ABNORMAL LOW (ref 39.0–52.0)
Hemoglobin: 12.4 g/dL — ABNORMAL LOW (ref 13.0–17.0)
Immature Granulocytes: 0 %
Lymphocytes Relative: 5 %
Lymphs Abs: 0.4 10*3/uL — ABNORMAL LOW (ref 0.7–4.0)
MCH: 29.9 pg (ref 26.0–34.0)
MCHC: 34 g/dL (ref 30.0–36.0)
MCV: 88 fL (ref 80.0–100.0)
Monocytes Absolute: 0.3 10*3/uL (ref 0.1–1.0)
Monocytes Relative: 4 %
Neutro Abs: 7.5 10*3/uL (ref 1.7–7.7)
Neutrophils Relative %: 89 %
Platelets: 273 10*3/uL (ref 150–400)
RBC: 4.15 MIL/uL — ABNORMAL LOW (ref 4.22–5.81)
RDW: 19.4 % — ABNORMAL HIGH (ref 11.5–15.5)
WBC: 8.4 10*3/uL (ref 4.0–10.5)
nRBC: 0 % (ref 0.0–0.2)

## 2018-04-29 LAB — URIC ACID: Uric Acid, Serum: 5.5 mg/dL (ref 3.7–8.6)

## 2018-04-29 LAB — LACTATE DEHYDROGENASE: LDH: 144 U/L (ref 98–192)

## 2018-04-29 MED ORDER — CARVEDILOL 3.125 MG PO TABS: 3.1250 mg | ORAL_TABLET | Freq: Two times a day (BID) | ORAL | 3 refills | Status: DC

## 2018-04-29 MED ORDER — POTASSIUM CHLORIDE CRYS ER 10 MEQ PO TBCR: EXTENDED_RELEASE_TABLET | ORAL | 3 refills | Status: DC

## 2018-04-29 MED ORDER — SODIUM CHLORIDE 0.9 % IV SOLN
Freq: Once | INTRAVENOUS | Status: AC
  Administered 2018-04-29: 10:00:00 via INTRAVENOUS
  Filled 2018-04-29: qty 250

## 2018-04-29 MED ORDER — SODIUM CHLORIDE 0.9 % IV SOLN: 375.0000 mg/m2 | Freq: Once | INTRAVENOUS | Status: DC

## 2018-04-29 MED ORDER — PALONOSETRON HCL INJECTION 0.25 MG/5ML
0.2500 mg | Freq: Once | INTRAVENOUS | Status: AC
  Administered 2018-04-29: 0.25 mg via INTRAVENOUS
  Filled 2018-04-29: qty 5

## 2018-04-29 MED ORDER — DIPHENHYDRAMINE HCL 25 MG PO CAPS
50.0000 mg | ORAL_CAPSULE | Freq: Once | ORAL | Status: AC
  Administered 2018-04-29: 50 mg via ORAL
  Filled 2018-04-29: qty 2

## 2018-04-29 MED ORDER — ACETAMINOPHEN 325 MG PO TABS
650.0000 mg | ORAL_TABLET | Freq: Once | ORAL | Status: AC
  Administered 2018-04-29: 650 mg via ORAL
  Filled 2018-04-29: qty 2

## 2018-04-29 MED ORDER — SODIUM CHLORIDE 0.9 % IV SOLN
Freq: Once | INTRAVENOUS | Status: AC
  Administered 2018-04-29: 14:00:00 via INTRAVENOUS
  Filled 2018-04-29: qty 10

## 2018-04-29 MED ORDER — HEPARIN SOD (PORK) LOCK FLUSH 100 UNIT/ML IV SOLN
500.0000 [IU] | Freq: Once | INTRAVENOUS | Status: AC
  Administered 2018-04-29: 500 [IU] via INTRAVENOUS
  Filled 2018-04-29: qty 5

## 2018-04-29 MED ORDER — DEXAMETHASONE SODIUM PHOSPHATE 10 MG/ML IJ SOLN
10.0000 mg | Freq: Once | INTRAMUSCULAR | Status: AC
  Administered 2018-04-29: 10 mg via INTRAVENOUS
  Filled 2018-04-29: qty 1

## 2018-04-29 MED ORDER — SODIUM CHLORIDE 0.9 % IV SOLN
375.0000 mg/m2 | Freq: Once | INTRAVENOUS | Status: AC
  Administered 2018-04-29: 900 mg via INTRAVENOUS
  Filled 2018-04-29: qty 50

## 2018-04-29 MED ORDER — VINCRISTINE SULFATE CHEMO INJECTION 1 MG/ML
2.0000 mg | Freq: Once | INTRAVENOUS | Status: AC
  Administered 2018-04-29: 2 mg via INTRAVENOUS
  Filled 2018-04-29: qty 2

## 2018-04-29 MED ORDER — DOXORUBICIN HCL CHEMO IV INJECTION 2 MG/ML
50.0000 mg/m2 | Freq: Once | INTRAVENOUS | Status: AC
  Administered 2018-04-29: 122 mg via INTRAVENOUS
  Filled 2018-04-29: qty 61

## 2018-04-29 MED ORDER — SODIUM CHLORIDE 0.9% FLUSH
10.0000 mL | INTRAVENOUS | Status: DC | PRN
  Administered 2018-04-29: 10 mL via INTRAVENOUS
  Filled 2018-04-29: qty 10

## 2018-04-29 MED ORDER — SODIUM CHLORIDE 0.9 % IV SOLN
750.0000 mg/m2 | Freq: Once | INTRAVENOUS | Status: AC
  Administered 2018-04-29: 1820 mg via INTRAVENOUS
  Filled 2018-04-29: qty 91

## 2018-04-29 MED ORDER — VALACYCLOVIR HCL 500 MG PO TABS: 500.0000 mg | ORAL_TABLET | Freq: Two times a day (BID) | ORAL | 0 refills | Status: DC

## 2018-04-29 MED ORDER — SODIUM CHLORIDE 0.9 % IV SOLN: 10.0000 mg | Freq: Once | INTRAVENOUS | Status: DC

## 2018-04-29 NOTE — Telephone Encounter (Signed)
Ronnie Benes, RN spoke with the patient regarding his myoview results- see result note.

## 2018-04-29 NOTE — Progress Notes (Signed)
No new changes noted today 

## 2018-04-29 NOTE — Patient Instructions (Signed)
USE the potassium pills that you have. Take those (20 mEq)daily for 3 days. THEN, you can split them and take half (10 mEq) daily until you run out. THEN you can go get the new prescription. It will then be 1 pill, but the dose will be 10 mEq.   Questions... Message or call me.  Respectfully, Honor Loh, MSN, APRN, FNP-C, CEN Oncology/Hematology Nurse Practitioner  [facility]   Hypokalemia Hypokalemia means that the amount of potassium in the blood is lower than normal.Potassium is a chemical that helps regulate the amount of fluid in the body (electrolyte). It also stimulates muscle tightening (contraction) and helps nerves work properly.Normally, most of the body's potassium is inside of cells, and only a very small amount is in the blood. Because the amount in the blood is so small, minor changes to potassium levels in the blood can be life-threatening. What are the causes? This condition may be caused by:  Antibiotic medicine.  Diarrhea or vomiting. Taking too much of a medicine that helps you have a bowel movement (laxative) can cause diarrhea and lead to hypokalemia.  Chronic kidney disease (CKD).  Medicines that help the body get rid of excess fluid (diuretics).  Eating disorders, such as bulimia.  Low magnesium levels in the body.  Sweating a lot.  What are the signs or symptoms? Symptoms of this condition include:  Weakness.  Constipation.  Fatigue.  Muscle cramps.  Mental confusion.  Skipped heartbeats or irregular heartbeat (palpitations).  Tingling or numbness.  How is this diagnosed? This condition is diagnosed with a blood test. How is this treated? Hypokalemia can be treated by taking potassium supplements by mouth or adjusting the medicines that you take. Treatment may also include eating more foods that contain a lot of potassium. If your potassium level is very low, you may need to get potassium through an IV tube  in one of your veins and be monitored in the hospital. Follow these instructions at home:  Take over-the-counter and prescription medicines only as told by your health care provider. This includes vitamins and supplements.  Eat a healthy diet. A healthy diet includes fresh fruits and vegetables, whole grains, healthy fats, and lean proteins.  If instructed, eat more foods that contain a lot of potassium, such as: ? Nuts, such as peanuts and pistachios. ? Seeds, such as sunflower seeds and pumpkin seeds. ? Peas, lentils, and lima beans. ? Whole grain and bran cereals and breads. ? Fresh fruits and vegetables, such as apricots, avocado, bananas, cantaloupe, kiwi, oranges, tomatoes, asparagus, and potatoes. ? Orange juice. ? Tomato juice. ? Red meats. ? Yogurt.  Keep all follow-up visits as told by your health care provider. This is important. Contact a health care provider if:  You have weakness that gets worse.  You feel your heart pounding or racing.  You vomit.  You have diarrhea.  You have diabetes (diabetes mellitus) and you have trouble keeping your blood sugar (glucose) in your target range. Get help right away if:  You have chest pain.  You have shortness of breath.  You have vomiting or diarrhea that lasts for more than 2 days.  You faint. This information is not intended to replace advice given to you by your health care provider. Make sure you discuss any questions you have with your health care provider. Document Released: 07/07/2005 Document Revised: 02/23/2016 Document Reviewed: 02/23/2016 Elsevier Interactive Patient Education  2018 [employer].

## 2018-04-29 NOTE — Progress Notes (Signed)
Proceed with treatment today, reviewed parameters, per Dr. Irish Lack, NP

## 2018-04-29 NOTE — Progress Notes (Signed)
[facility] day:  04/29/2018   Chief Complaint: Ronnie Mcintyre is a 52 y.o. male with a large B-cell lymphoma who is seen for review of interval CT and assessment prior to cycle #4 RCHOP chemotherapy.  HPI:  The patient was last seen in the medical oncology clinic on 04/22/2018.  At that time, he was doing well. He denied any B symptoms.  He denied any chest pain.  Exam was unremarkable. Potassium was 3.3.  Potassium was supplemented.  Echo revealed an EF of 50-55% with new severe hypokinesis of the mid apical-anteroseptal, anterior, and apical myocardium.  Patient was referred to cardiology.  He was seen by Dr. Fletcher Anon on 04/22/2018.  He reported chest pain in 12/2017 when he was diagnosed with cancer.  He was scheduled for treadmill Myoview.  Treadmill Myoview on 04/26/2018, whereby the patient had a total exercise duration of 4 minutes and 45 seconds, revealed:   Nondiagnostic EKG due to left anterior fascicular block.  Small defect present in the apex location. This defect is partially reversible and could represent small previous myocardial infarction. However, and apical thinning artifact cannot be excluded  Nuclear stress EF: 50%.  This is a low risk study.  Dr. Tyrell Antonio recommendation was adding carvedilol 3.125 mg twice daily (has not started).  He could resume chemotherapy, but recommended repeating limited echocardiogram after the next cycle of chemotherapy.  He has a follow-up with cardiology in 1 month.  During the interim, patient has been doing well overall. He notes that he remains anxious overall, but more so over the last few days because he has not heard from the EST performed on 04/26/2018. Patient continues to deny chest pain and associated shortness of breath. Since being seen by cardiology, patient states, "All of this has been like a wake up call for me. I am starting to try to eat better and be more active. I am doing like 45  minutes of cardio a day".   Patient denies any symptoms associated with his chemotherapy treatments. Patient denies that he has experienced any B symptoms. He denies any interval infections. Patient advises that he maintains an adequate appetite. He is eating well. Weight today is 254 lb 1.6 oz (115.3 kg), which compared to his last visit to the clinic, represents a stable weight.   Patient denies pain in the clinic today.   Past Medical History:  Diagnosis Date  . Cancer ([place])   . Hypertension     Past Surgical History:  Procedure Laterality Date  . ANKLE SURGERY Right   . PORTACATH PLACEMENT Right 02/11/2018   Procedure: INSERTION PORT-A-CATH;  Surgeon: Jules Husbands, MD;  Location: [facility] ORS;  Service: General;  Laterality: Right;    Family History  Problem Relation Age of Onset  . Hypertension Mother   . Hypertension Father   . Multiple myeloma Father     Social History:  reports that he has never smoked. He has never used smokeless tobacco. He reports that he drinks alcohol. He reports that he does not use drugs.  He drinks a beer 1-2 x/week.  He works for [employer].  He lives in [place].  His is alone today.  Allergies: No Known Allergies  Current Medications: Current Outpatient Medications  Medication Sig Dispense Refill  . allopurinol (ZYLOPRIM) 300 MG tablet Take 1 tablet (300 mg total) by mouth daily. 90 tablet 1  . aspirin 81 MG tablet Take 81 mg by mouth  daily.     . losartan-hydrochlorothiazide (HYZAAR) 100-25 MG tablet Take 1 tablet by mouth daily.    [name] oxyCODONE (OXY IR/ROXICODONE) 5 MG immediate release tablet Take 1 tablet (5 mg total) by mouth every 6 (six) hours as needed for moderate pain. 30 tablet 0  . pantoprazole (PROTONIX) 40 MG tablet TAKE 1 TABLET (40 MG TOTAL) BY MOUTH DAILY. USE DAILY WHILE ON PREDNISONE FOR GI PROPHYLAXIS. 30 tablet 1  . simvastatin (ZOCOR) 40 MG tablet Take 40 mg by mouth daily.    . valACYclovir (VALTREX) 500 MG tablet  Take 1 tablet (500 mg total) by mouth 2 (two) times daily. 60 tablet 0  . acetaminophen (TYLENOL) 325 MG tablet Take 2 tablets (650 mg total) by mouth every 6 (six) hours as needed for mild pain (or Fever >/= 101). (Patient not taking: Reported on 04/29/2018)    . carvedilol (COREG) 3.125 MG tablet Take 1 tablet (3.125 mg total) by mouth 2 (two) times daily with a meal. 180 tablet 3  . felodipine (PLENDIL) 5 MG 24 hr tablet Take 5 mg by mouth daily.    . fluticasone (FLONASE) 50 MCG/ACT nasal spray Place 2 sprays into both nostrils daily. (Patient not taking: Reported on 04/29/2018) 16 g 0  . hydrocortisone 1 % lotion Apply 1 application topically 2 (two) times daily as needed for itching.    . lidocaine-prilocaine (EMLA) cream Apply to affected area once (Patient not taking: Reported on 04/29/2018) 30 g 3  . loratadine (CLARITIN) 10 MG tablet Take 10 mg by mouth daily as needed for allergies.     [name] LORazepam (ATIVAN) 0.5 MG tablet Take 1 tablet (0.5 mg total) by mouth every 6 (six) hours as needed (Nausea or vomiting). (Patient not taking: Reported on 04/29/2018) 30 tablet 0  . ondansetron (ZOFRAN) 8 MG tablet Take 1 tablet (8 mg total) by mouth 2 (two) times daily as needed. Start on day 3 after cyclophosphamide chemotherapy. (Patient not taking: Reported on 04/29/2018) 30 tablet 1  . potassium chloride SA (K-DUR,KLOR-CON) 10 MEQ tablet Take 1 tablet (10 mEq) daily. 30 tablet 3  . predniSONE (DELTASONE) 50 MG tablet TAKE 2 TABLETS (100 MG TOTAL) BY MOUTH DAILY. TAKE ON DAYS 1-5 OF CHEMOTHERAPY. (Patient not taking: Reported on 04/29/2018) 60 tablet 1   No current facility-administered medications for this visit.    Facility-Administered Medications Ordered in Other Visits  Medication Dose Route Frequency Provider Last Rate Last Dose  . heparin lock flush 100 unit/mL  500 Units Intravenous Once Corcoran, Melissa C, MD      . riTUXimab (RITUXAN) 900 mg in sodium chloride 0.9 % 160 mL infusion  375  mg/m2 (Treatment Plan Recorded) Intravenous Once Corcoran, Melissa C, MD      . sodium chloride 0.9 % 500 mL with potassium chloride 20 mEq infusion   Intravenous Once Honor Loh E, NP      . sodium chloride flush (NS) 0.9 % injection 10 mL  10 mL Intravenous PRN Lequita Asal, MD   10 mL at 04/29/18 0825    Review of Systems  Constitutional: Negative for diaphoresis, fever, malaise/fatigue and weight loss (stable).       "I am feeling good. Just anxious". Energy stable. Exercising.   HENT: Negative.   Eyes: Negative.   Respiratory: Negative for cough, hemoptysis, sputum production and shortness of breath.   Cardiovascular: Negative for chest pain, palpitations, orthopnea, leg swelling and PND.       Recent echo  and EST  Gastrointestinal: Negative for abdominal pain, blood in stool, constipation, diarrhea, melena, nausea and vomiting.  Genitourinary: Negative for dysuria, frequency, hematuria and urgency.  Musculoskeletal: Negative for back pain, falls, joint pain and myalgias.  Skin: Negative for itching and rash.  Neurological: Positive for sensory change (mild (stable) neuropathy to fingertips). Negative for dizziness, tremors, weakness and headaches.  Endo/Heme/Allergies: Does not bruise/bleed easily.  Psychiatric/Behavioral: Negative for depression, memory loss and suicidal ideas. The patient is nervous/anxious and has insomnia (anxiety and steroids).   All other systems reviewed and are negative.  Performance status (ECOG): 1 - Symptomatic but completely ambulatory  Vital Signs BP 107/74 (BP Location: Left Arm, Patient Position: Sitting)   Pulse (!) 108   Temp 97.7 F (36.5 C) (Tympanic)   Resp 18   Ht 6' (1.829 m)   Wt 254 lb 1.6 oz (115.3 kg)   SpO2 97%   BMI 34.46 kg/m   Physical Exam  Constitutional: He is oriented to person, place, and time and well-developed, well-nourished, and in no distress.  HENT:  Head: Normocephalic and atraumatic.  Near alopecia   Eyes: Pupils are equal, round, and reactive to light. EOM are normal. No scleral icterus.  Blue eyes  Neck: Normal range of motion. Neck supple. No tracheal deviation present. No thyromegaly present.  Cardiovascular: Regular rhythm and normal heart sounds. Tachycardia present. Exam reveals no gallop and no friction rub.  No murmur heard. Pulmonary/Chest: Effort normal and breath sounds normal. No respiratory distress. He has no wheezes. He has no rales.  Abdominal: Soft. Bowel sounds are normal. He exhibits no distension. There is no tenderness.  Musculoskeletal: Normal range of motion. He exhibits no edema or tenderness.  Lymphadenopathy:    He has no cervical adenopathy.    He has no axillary adenopathy.       Right: No inguinal and no supraclavicular adenopathy present.       Left: No inguinal and no supraclavicular adenopathy present.  Neurological: He is alert and oriented to person, place, and time.  Skin: Skin is warm and dry. No rash noted. No erythema.  Area of residual hyperpigmentation from previous zoster infection  Psychiatric: Mood, affect and judgment normal.  Nursing note and vitals reviewed.   Infusion on 04/29/2018  Component Date Value Ref Range Status  . Uric Acid, Serum 04/29/2018 5.5  3.7 - 8.6 mg/dL Final   Performed at [facility], [address] [place], [place] [address]  . LDH 04/29/2018 144  98 - 192 U/L Final   Performed at [facility], [address] [place], [place] [address]  . Sodium 04/29/2018 139  135 - 145 mmol/L Final  . Potassium 04/29/2018 3.1* 3.5 - 5.1 mmol/L Final  . Chloride 04/29/2018 103  98 - 111 mmol/L Final  . CO2 04/29/2018 28  22 - 32 mmol/L Final  . Glucose, Bld 04/29/2018 125* 70 - 99 mg/dL Final  . BUN 04/29/2018 15  6 - 20 mg/dL Final  . Creatinine, Ser 04/29/2018 1.15  0.61 - 1.24 mg/dL Final  . Calcium 04/29/2018 9.4  8.9 - 10.3 mg/dL Final  . Total Protein 04/29/2018 6.6  6.5 - 8.1 g/dL Final  . Albumin  04/29/2018 4.0  3.5 - 5.0 g/dL Final  . AST 04/29/2018 22  15 - 41 U/L Final  . ALT 04/29/2018 28  0 - 44 U/L Final  . Alkaline Phosphatase 04/29/2018 60  38 - 126 U/L Final  . Total Bilirubin 04/29/2018  1.0  0.3 - 1.2 mg/dL Final  . GFR calc non Af Amer 04/29/2018 >60  >60 mL/min Final  . GFR calc Af Amer 04/29/2018 >60  >60 mL/min Final   Comment: (NOTE) The eGFR has been calculated using the CKD EPI equation. This calculation has not been validated in all clinical situations. eGFR's persistently <60 mL/min signify possible Chronic Kidney Disease.   Georgiann Hahn gap 04/29/2018 8  5 - 15 Final   Performed at [facility], [address] [place], [place] [address]  . WBC 04/29/2018 8.4  4.0 - 10.5 K/uL Final  . RBC 04/29/2018 4.15* 4.22 - 5.81 MIL/uL Final  . Hemoglobin 04/29/2018 12.4* 13.0 - 17.0 g/dL Final  . HCT 04/29/2018 36.5* 39.0 - 52.0 % Final  . MCV 04/29/2018 88.0  80.0 - 100.0 fL Final  . MCH 04/29/2018 29.9  26.0 - 34.0 pg Final  . MCHC 04/29/2018 34.0  30.0 - 36.0 g/dL Final  . RDW 04/29/2018 19.4* 11.5 - 15.5 % Final  . Platelets 04/29/2018 273  150 - 400 K/uL Final  . nRBC 04/29/2018 0.0  0.0 - 0.2 % Final  . Neutrophils Relative % 04/29/2018 89  % Final  . Neutro Abs 04/29/2018 7.5  1.7 - 7.7 K/uL Final  . Lymphocytes Relative 04/29/2018 5  % Final  . Lymphs Abs 04/29/2018 0.4* 0.7 - 4.0 K/uL Final  . Monocytes Relative 04/29/2018 4  % Final  . Monocytes Absolute 04/29/2018 0.3  0.1 - 1.0 K/uL Final  . Eosinophils Relative 04/29/2018 1  % Final  . Eosinophils Absolute 04/29/2018 0.1  0.0 - 0.5 K/uL Final  . Basophils Relative 04/29/2018 1  % Final  . Basophils Absolute 04/29/2018 0.0  0.0 - 0.1 K/uL Final  . Immature Granulocytes 04/29/2018 0  % Final  . Abs Immature Granulocytes 04/29/2018 0.03  0.00 - 0.07 K/uL Final   Performed at [facility], [address], [place], [place] [address]    Assessment:  Ronnie Mcintyre is a 52 y.o. male with  clincal stage IB bulky large B cell lymphoma s/p CT guided biopsy on 01/19/2018.  [facility] pathology revealed necrotic tissue. [facility] consultation revealed neoplastic cells are positive for CD20, BCL-6, MUM-1, and BCL-2. The neoplastic cells were negative for CD30, CD3, , CD10, c-MYC, EBV ISH, AE1/3, and PAX-8. Ki67 is 60-70%. 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 The absence of CD30 expression makes primary mediastinal large B-cell lymphoma less likely. FISH for MYC rearrangement was negative. IPI score, age adjusted IPI, are low (1) and NCCN IPI score is low-intermediate (2).  He presented with upper chest fullness with radiation to his neck and arm. He has had B symptoms (sweats and weight loss) worrisome for lymphoma. LDHis 273. Uric acid was normal.  Beta-HCG and AFP were normal on 01/16/2018.  Chest CT angiogramon 01/15/2018 revealed an 11.3 x 8.0 cm anterior mediastinal mass.  Abdomen and pelvic CT on 01/16/2018 revealed a 6.3 x 5.6 cm soft tissue mass or medium density fluid filling the urinary bladder.  There was a 1.5 cm mildly complex lower pole right renal cyst. There was a 1.9 cm indeterminate left adrenal mass.  There was a small pericardial effusion.  Renal ultrasound on 01/25/2018 revealed no evidence of bladder mass.  Finding on prior CT exam likely represented excretion of residual contrast material from a CTA chest exam performed 1 day prior   PET scan on 01/26/2018 revealed  an 11.1 cm intensely hypermetabolic and partially necrotic right anterior mediastinal mass, compatible with malignancy. There was diffuse marrow hypermetabolism, nonspecific, cannot exclude an infiltrative neoplastic marrow process. There was no focal skeletal hypermetabolism. There was no focal bone lesions on the CT images.  There was nonspecific mild heterogeneous prostatic hypermetabolism.  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    Bone marrow biopsy on 02/03/2018 revealed a slightly hypercellular bone marrow for age with trilineage hematopoiesis.  There was no monoclonal B-cell population or abnormal T-cell phenotype identified.  Cytogenetics were normal (46, XY).  Echo on 01/15/2018 revealed an EF of 55-60%.  Echo on 04/22/2018 revealed an EF of 50 to 55%. There was evidence of probable severe hypokinesis of the mid apical-anteroseptal, anterior, and apical myocardium.  Treadmill Myoview on 04/26/2018 revealed a low risk study with a small defect present in the apex location that was partially reversible and could represent small previous myocardial infarction. However, and apical thinning artifact could not be excluded.  Nuclear stress EF: 50%.    Hepatitis B, hepatitis C, and HIV testing was negative on 02/10/2018.  G6PD assay was normal.  He s/p 3 cycles of RCHOP chemotherapy (02/16/2018 - 04/01/2018) with Neulasta support.  Chest, abdomen, and pelvic CT on 04/19/2018 revealed the anterior mediastinal mass had significantly reduced in size (10.1 x 6.9 cm to 6.8 by 4.5 cm).  The smaller left eccentric mediastinal lymph node measured 1.4 x 4.7 cm, previously 2.0 x 3.2 cm.  There was no appreciable adenopathy in the abdomen/pelvis.  There were bilateral hypodense renal lesions technically too small to characterize, including the right kidney lower pole lesion.   He was diagnosed with T6-T7 herpes zoster on 03/19/2018.  He was treated with valacyclovir.  He is on prophylactic valacyclovir.    Symptomatically, patient is doing well. He denies any acute symptoms. No chest pain or shortness of breath. He remains active. No B symptoms or recent infections. Exam is stable.  WBC 8400 ([place] 7500).  Platelets 272,000.  Potassium low at 3.1.  BUN 15 and creatinine 1.15 (CrCl 100.8 mL/min).  LDH and uric acid normal.  Plan: 1. Labs today:  CBC with diff, CMP, LDH, uric acid. 2. DLBCL  Doing  well overall.  Tolerating treatment with minimal side effects.  Labs reviewed. Blood counts stable and adequate enough for treatment.  Has been seen by cardiology and cleared.  Will proceed with cycle #4 RCHOP.  Discuss symptom management.  Patient has antiemetics and pain medications at home to use on a PRN basis. Patient  advising that the  prescribed interventions are adequate at this point. Continue all medications as previously prescribed. 3. Cardiac wall motion abnormalities  Seen in consult by Dr. Fletcher Anon for abnormal echocardiogram.  Patient underwent stress Myoview that revealed a partially reversible defect consistent with previous MI.  LVEF (nuclear) 50%.  Patient is unaware of official stress Myoview results and cardiology recommendations.    Spoke with Dr. Tyrell Antonio office and asked them to follow-up with patient today.  Patient to be started on low-dose carvedilol 3.125 twice daily.  Recommending repeat echocardiogram prior to cycle #5 RCHOP. 4. Chemotherapy-induced neuropathy  Patient has stable neuropathy in his fingertips only.   He notes that his neuropathy does not impose any functional limitations.  No interventions required at this time.  Will monitor closely for progression. 5. Shingles  Resolved with residual skin changes.  Continue prophylactic valacyclovir 500 mg  twice daily. 6. HYPOkalemia  Potassium low at 3.1 today.  Previously treated with oral KCl 20 mEq daily x3 days.  Additionally, he has increased dietary intake of potassium rich foods.  Additional supplementation required as follows:  KCl 20 mEq IV today here in infusion center.   KCl 20 mEq orally x3 days (04/30/2018-05/02/2018). Will use current supply.   Start KCl 10 mEq daily, until advised otherwise, on 05/03/2018. New Rx sent to pharmacy.  7. Tumor lysis monitoring  No evidence of TLS.  Labs normal.  Continue to maintain adequate hydration and prophylactic allopurinol dose as  previously prescribed. 8. RTC on 04/30/2018 for Neulasta injection 9. Schedule repeat echocardiogram to be done on 05/17/2018. 10. RTC on 05/20/2018 for MD assessment, labs (CBC with differential, CMP, LDH, uric acid), and cycle #5 RCHOP with Neulasta support.   Honor Loh, NP  04/29/2018, 12:05 PM  I saw and evaluated the patient, participating in the key portions of the service and reviewing pertinent diagnostic studies and records.  I reviewed the nurse practitioner's note and agree with the findings and the plan.  The assessment and plan were discussed with the patient.  Multiple questions were asked by the patient and answered.   Nolon Stalls, MD 04/29/2018,12:05 PM

## 2018-04-30 ENCOUNTER — Inpatient Hospital Stay: Payer: Commercial Managed Care - PPO

## 2018-04-30 DIAGNOSIS — Z5111 Encounter for antineoplastic chemotherapy: Secondary | ICD-10-CM | POA: Diagnosis not present

## 2018-04-30 DIAGNOSIS — C8510 Unspecified B-cell lymphoma, unspecified site: Secondary | ICD-10-CM

## 2018-04-30 MED ORDER — PEGFILGRASTIM INJECTION 6 MG/0.6ML ~~LOC~~
6.0000 mg | PREFILLED_SYRINGE | Freq: Once | SUBCUTANEOUS | Status: AC
  Administered 2018-04-30: 6 mg via SUBCUTANEOUS

## 2018-05-07 ENCOUNTER — Other Ambulatory Visit: Payer: Self-pay | Admitting: Urgent Care

## 2018-05-17 ENCOUNTER — Ambulatory Visit
Admission: RE | Admit: 2018-05-17 | Discharge: 2018-05-17 | Disposition: A | Discharge status: Home / Self Care | Payer: Commercial Managed Care - PPO | Source: Ambulatory Visit | Attending: Urgent Care | Admitting: Urgent Care

## 2018-05-17 DIAGNOSIS — R931 Abnormal findings on diagnostic imaging of heart and coronary circulation: Secondary | ICD-10-CM | POA: Diagnosis not present

## 2018-05-17 DIAGNOSIS — C8510 Unspecified B-cell lymphoma, unspecified site: Secondary | ICD-10-CM | POA: Insufficient documentation

## 2018-05-17 DIAGNOSIS — R0602 Shortness of breath: Secondary | ICD-10-CM

## 2018-05-17 DIAGNOSIS — Z5111 Encounter for antineoplastic chemotherapy: Secondary | ICD-10-CM | POA: Diagnosis not present

## 2018-05-17 MED ORDER — PERFLUTREN LIPID MICROSPHERE
1.0000 mL | INTRAVENOUS | Status: AC | PRN
  Administered 2018-05-17: 3 mL via INTRAVENOUS
  Filled 2018-05-17: qty 10

## 2018-05-17 NOTE — Progress Notes (Signed)
*  PRELIMINARY RESULTS* Echocardiogram 2D Echocardiogram has been performed. Definity image enhancer was administered.  Ronnie Mcintyre [name] 05/17/2018, 11:06 AM

## 2018-05-20 ENCOUNTER — Other Ambulatory Visit: Payer: Self-pay | Admitting: Hematology and Oncology

## 2018-05-20 ENCOUNTER — Inpatient Hospital Stay: Payer: Commercial Managed Care - PPO

## 2018-05-20 ENCOUNTER — Encounter: Payer: Self-pay | Admitting: Hematology and Oncology

## 2018-05-20 ENCOUNTER — Other Ambulatory Visit: Payer: Self-pay

## 2018-05-20 ENCOUNTER — Inpatient Hospital Stay (HOSPITAL_BASED_OUTPATIENT_CLINIC_OR_DEPARTMENT_OTHER): Payer: Commercial Managed Care - PPO | Admitting: Hematology and Oncology

## 2018-05-20 VITALS — BP 114/77 | HR 102 | Temp 97.1°F | Resp 18 | Wt 255.2 lb

## 2018-05-20 DIAGNOSIS — E876 Hypokalemia: Secondary | ICD-10-CM | POA: Diagnosis not present

## 2018-05-20 DIAGNOSIS — C8510 Unspecified B-cell lymphoma, unspecified site: Secondary | ICD-10-CM

## 2018-05-20 DIAGNOSIS — Z5111 Encounter for antineoplastic chemotherapy: Secondary | ICD-10-CM

## 2018-05-20 DIAGNOSIS — Z7982 Long term (current) use of aspirin: Secondary | ICD-10-CM | POA: Diagnosis not present

## 2018-05-20 DIAGNOSIS — Z79899 Other long term (current) drug therapy: Secondary | ICD-10-CM

## 2018-05-20 DIAGNOSIS — Z5112 Encounter for antineoplastic immunotherapy: Secondary | ICD-10-CM

## 2018-05-20 DIAGNOSIS — R931 Abnormal findings on diagnostic imaging of heart and coronary circulation: Secondary | ICD-10-CM | POA: Diagnosis not present

## 2018-05-20 LAB — COMPREHENSIVE METABOLIC PANEL
ALT: 30 U/L (ref 0–44)
AST: 25 U/L (ref 15–41)
Albumin: 4 g/dL (ref 3.5–5.0)
Alkaline Phosphatase: 63 U/L (ref 38–126)
Anion gap: 6 (ref 5–15)
BUN: 13 mg/dL (ref 6–20)
CO2: 26 mmol/L (ref 22–32)
Calcium: 9.3 mg/dL (ref 8.9–10.3)
Chloride: 103 mmol/L (ref 98–111)
Creatinine, Ser: 1.01 mg/dL (ref 0.61–1.24)
GFR calc Af Amer: >60 mL/min (ref >60)
GFR calc non Af Amer: >60 mL/min (ref >60)
Glucose, Bld: 129 mg/dL — ABNORMAL HIGH (ref 70–99)
Potassium: 3.6 mmol/L (ref 3.5–5.1)
Sodium: 135 mmol/L (ref 135–145)
Total Bilirubin: 1 mg/dL (ref 0.3–1.2)
Total Protein: 7.2 g/dL (ref 6.5–8.1)

## 2018-05-20 LAB — CBC WITH DIFFERENTIAL/PLATELET
Abs Immature Granulocytes: 0.06 10*3/uL (ref 0.00–0.07)
Basophils Absolute: 0.1 10*3/uL (ref 0.0–0.1)
Basophils Relative: 1 %
Eosinophils Absolute: 0 10*3/uL (ref 0.0–0.5)
Eosinophils Relative: 1 %
HCT: 36.2 % — ABNORMAL LOW (ref 39.0–52.0)
Hemoglobin: 12.3 g/dL — ABNORMAL LOW (ref 13.0–17.0)
Immature Granulocytes: 1 %
Lymphocytes Relative: 6 %
Lymphs Abs: 0.5 10*3/uL — ABNORMAL LOW (ref 0.7–4.0)
MCH: 30.5 pg (ref 26.0–34.0)
MCHC: 34 g/dL (ref 30.0–36.0)
MCV: 89.8 fL (ref 80.0–100.0)
Monocytes Absolute: 0.5 10*3/uL (ref 0.1–1.0)
Monocytes Relative: 5 %
Neutro Abs: 7.8 10*3/uL — ABNORMAL HIGH (ref 1.7–7.7)
Neutrophils Relative %: 86 %
Platelets: 341 10*3/uL (ref 150–400)
RBC: 4.03 MIL/uL — ABNORMAL LOW (ref 4.22–5.81)
RDW: 17.6 % — ABNORMAL HIGH (ref 11.5–15.5)
WBC: 8.9 10*3/uL (ref 4.0–10.5)
nRBC: 0 % (ref 0.0–0.2)

## 2018-05-20 LAB — LACTATE DEHYDROGENASE: LDH: 162 U/L (ref 98–192)

## 2018-05-20 LAB — URIC ACID: Uric Acid, Serum: 5 mg/dL (ref 3.7–8.6)

## 2018-05-20 MED ORDER — DEXAMETHASONE SODIUM PHOSPHATE 10 MG/ML IJ SOLN
10.0000 mg | Freq: Once | INTRAMUSCULAR | Status: AC
  Administered 2018-05-20: 10 mg via INTRAVENOUS
  Filled 2018-05-20: qty 1

## 2018-05-20 MED ORDER — SODIUM CHLORIDE 0.9 % IV SOLN
Freq: Once | INTRAVENOUS | Status: AC
  Administered 2018-05-20: 11:00:00 via INTRAVENOUS
  Filled 2018-05-20: qty 250

## 2018-05-20 MED ORDER — SODIUM CHLORIDE 0.9 % IV SOLN
750.0000 mg/m2 | Freq: Once | INTRAVENOUS | Status: AC
  Administered 2018-05-20: 1820 mg via INTRAVENOUS
  Filled 2018-05-20: qty 91

## 2018-05-20 MED ORDER — PALONOSETRON HCL INJECTION 0.25 MG/5ML
0.2500 mg | Freq: Once | INTRAVENOUS | Status: AC
  Administered 2018-05-20: 0.25 mg via INTRAVENOUS
  Filled 2018-05-20: qty 5

## 2018-05-20 MED ORDER — SODIUM CHLORIDE 0.9 % IV SOLN
375.0000 mg/m2 | Freq: Once | INTRAVENOUS | Status: AC
  Administered 2018-05-20: 900 mg via INTRAVENOUS
  Filled 2018-05-20: qty 50

## 2018-05-20 MED ORDER — DOXORUBICIN HCL CHEMO IV INJECTION 2 MG/ML
50.0000 mg/m2 | Freq: Once | INTRAVENOUS | Status: AC
  Administered 2018-05-20: 122 mg via INTRAVENOUS
  Filled 2018-05-20: qty 61

## 2018-05-20 MED ORDER — DIPHENHYDRAMINE HCL 25 MG PO CAPS
50.0000 mg | ORAL_CAPSULE | Freq: Once | ORAL | Status: AC
  Administered 2018-05-20: 50 mg via ORAL
  Filled 2018-05-20: qty 2

## 2018-05-20 MED ORDER — HEPARIN SOD (PORK) LOCK FLUSH 100 UNIT/ML IV SOLN
500.0000 [IU] | Freq: Once | INTRAVENOUS | Status: AC | PRN
  Administered 2018-05-20: 500 [IU]
  Filled 2018-05-20: qty 5

## 2018-05-20 MED ORDER — ACETAMINOPHEN 325 MG PO TABS
650.0000 mg | ORAL_TABLET | Freq: Once | ORAL | Status: AC
  Administered 2018-05-20: 650 mg via ORAL
  Filled 2018-05-20: qty 2

## 2018-05-20 MED ORDER — VINCRISTINE SULFATE CHEMO INJECTION 1 MG/ML
2.0000 mg | Freq: Once | INTRAVENOUS | Status: AC
  Administered 2018-05-20: 2 mg via INTRAVENOUS
  Filled 2018-05-20: qty 2

## 2018-05-20 NOTE — Progress Notes (Signed)
[facility] day:  05/20/2018   Chief Complaint: Ronnie Mcintyre is a 52 y.o. male with a large B-cell lymphoma who is seen for assessment prior to cycle #5 RCHOP chemotherapy.  HPI:  The patient was last seen in the medical oncology clinic on 04/29/2018.  At that time, he was doing well. He denied any acute symptoms. No chest pain or shortness of breath. He remained active. No B symptoms or recent infections. Exam was stable.  WBC 8400 ([place] 7500).  Platelets 272,000.  Potassium was 3.1.  BUN 15 and creatinine 1.15 (CrCl 100.8 mL/min).  LDH and uric acid were normal.  He received cycle #4 RCHOP chemotherapy.  He bgean oral potassium (20 mEq orally x3 days then 10 mEq a day).  Echo on 05/17/2018 revealed an EF of 50-55%.  There was possible mild hypokinesis of the mid-apicalanteroseptal myocardium. Left ventricular diastolic function parameters were normal.  There were no significant changes since the most recent echo.  Patient received his annual influenza vaccination on 05/15/2018. He denies any symptoms following the injection.   During the interim, patient is doing well overall. He notes that he "felt bad" for about 5 days following his last treatment cycle. He denies nausea, vomiting, and changes to his bowel habits. He denies any chest pain or shortness of breath. Patient denies that he has experienced any B symptoms. He denies any interval infections.   Patient advises that he maintains an adequate appetite. He is eating well. Weight today is 255 lb 3.2 oz (115.8 kg), which compared to his last visit to the clinic, represents a 1 pound increase.   Patient denies pain in the clinic today. Patient has plans to return to work before then end of the year.    Past Medical History:  Diagnosis Date  . Cancer ([place])   . Hypertension     Past Surgical History:  Procedure Laterality Date  . ANKLE SURGERY Right   . PORTACATH PLACEMENT Right  02/11/2018   Procedure: INSERTION PORT-A-CATH;  Surgeon: Jules Husbands, MD;  Location: [facility] ORS;  Service: General;  Laterality: Right;    Family History  Problem Relation Age of Onset  . Hypertension Mother   . Hypertension Father   . Multiple myeloma Father     Social History:  reports that he has never smoked. He has never used smokeless tobacco. He reports that he drinks alcohol. He reports that he does not use drugs.  He drinks a beer 1-2 x/week.  He works for [employer].  He lives in [place].  He is alone today.  Allergies: No Known Allergies  Current Medications: Current Outpatient Medications  Medication Sig Dispense Refill  . allopurinol (ZYLOPRIM) 300 MG tablet Take 1 tablet (300 mg total) by mouth daily. 90 tablet 1  . aspirin 81 MG tablet Take 81 mg by mouth daily.     . carvedilol (COREG) 3.125 MG tablet Take 1 tablet (3.125 mg total) by mouth 2 (two) times daily with a meal. 180 tablet 3  . felodipine (PLENDIL) 5 MG 24 hr tablet Take 5 mg by mouth daily.    . hydrocortisone 1 % lotion Apply 1 application topically 2 (two) times daily as needed for itching.    . loratadine (CLARITIN) 10 MG tablet Take 10 mg by mouth daily as needed for allergies.     [name] losartan-hydrochlorothiazide (HYZAAR) 100-25 MG tablet Take 1 tablet by mouth daily.    [name]  oxyCODONE (OXY IR/ROXICODONE) 5 MG immediate release tablet Take 1 tablet (5 mg total) by mouth every 6 (six) hours as needed for moderate pain. 30 tablet 0  . pantoprazole (PROTONIX) 40 MG tablet TAKE 1 TABLET (40 MG TOTAL) BY MOUTH DAILY. USE DAILY WHILE ON PREDNISONE FOR GI PROPHYLAXIS. 90 tablet 1  . potassium chloride SA (K-DUR,KLOR-CON) 10 MEQ tablet Take 1 tablet (10 mEq) daily. 30 tablet 3  . predniSONE (DELTASONE) 50 MG tablet TAKE 2 TABLETS (100 MG TOTAL) BY MOUTH DAILY. TAKE ON DAYS 1-5 OF CHEMOTHERAPY. 60 tablet 1  . valACYclovir (VALTREX) 500 MG tablet Take 1 tablet (500 mg total) by mouth 2 (two) times daily. 60  tablet 0  . acetaminophen (TYLENOL) 325 MG tablet Take 2 tablets (650 mg total) by mouth every 6 (six) hours as needed for mild pain (or Fever >/= 101). (Patient not taking: Reported on 04/29/2018)    . fluticasone (FLONASE) 50 MCG/ACT nasal spray Place 2 sprays into both nostrils daily. (Patient not taking: Reported on 04/29/2018) 16 g 0  . lidocaine-prilocaine (EMLA) cream Apply to affected area once (Patient not taking: Reported on 04/29/2018) 30 g 3  . LORazepam (ATIVAN) 0.5 MG tablet Take 1 tablet (0.5 mg total) by mouth every 6 (six) hours as needed (Nausea or vomiting). (Patient not taking: Reported on 04/29/2018) 30 tablet 0  . ondansetron (ZOFRAN) 8 MG tablet Take 1 tablet (8 mg total) by mouth 2 (two) times daily as needed. Start on day 3 after cyclophosphamide chemotherapy. (Patient not taking: Reported on 04/29/2018) 30 tablet 1  . simvastatin (ZOCOR) 40 MG tablet Take 40 mg by mouth daily.     No current facility-administered medications for this visit.     Review of Systems  Constitutional: Negative for diaphoresis, fever, malaise/fatigue and weight loss (up 1 pound).       Felt "bad" x 5 days after Tx. Energy now stable. Exercising.  HENT: Negative.   Eyes: Negative.   Respiratory: Negative for cough, hemoptysis, sputum production and shortness of breath.   Cardiovascular: Negative for chest pain, palpitations, orthopnea, leg swelling and PND.       Recent echocardiogram; LVEF 50-55%  Gastrointestinal: Negative for abdominal pain, blood in stool, constipation, diarrhea, melena, nausea and vomiting.  Genitourinary: Negative for dysuria, frequency, hematuria and urgency.  Musculoskeletal: Negative for back pain, falls, joint pain and myalgias.  Skin: Negative for itching and rash.  Neurological: Positive for sensory change (mild (stable) to fingertips). Negative for dizziness, tremors, weakness and headaches.  Endo/Heme/Allergies: Does not bruise/bleed easily.   Psychiatric/Behavioral: Negative for depression, memory loss and suicidal ideas. The patient has insomnia (anxiety and steroids). The patient is not nervous/anxious.   All other systems reviewed and are negative.  Performance status (ECOG): 1 - Symptomatic but completely ambulatory  Vital Signs BP 114/77 (BP Location: Left Arm)   Pulse (!) 102   Temp (!) 97.1 F (36.2 C) (Tympanic)   Resp 18   Wt 255 lb 3.2 oz (115.8 kg)   BMI 34.61 kg/m   Physical Exam  Constitutional: He is oriented to person, place, and time and well-developed, well-nourished, and in no distress. No distress.  HENT:  Head: Normocephalic and atraumatic.  Mouth/Throat: Oropharynx is clear and moist and mucous membranes are normal. No oropharyngeal exudate.  Near alopecia.  Eyes: Pupils are equal, round, and reactive to light. EOM are normal. No scleral icterus.  Blue.  Neck: Normal range of motion. Neck supple. No JVD present.  Cardiovascular: Regular rhythm, normal heart sounds and intact distal pulses. Tachycardia present. Exam reveals no gallop and no friction rub.  No murmur heard. Pulmonary/Chest: Effort normal and breath sounds normal. No respiratory distress. He has no wheezes. He has no rales.  Abdominal: Soft. Bowel sounds are normal. He exhibits no distension and no mass. There is no abdominal tenderness. There is no rebound and no guarding.  Musculoskeletal: Normal range of motion.        General: No tenderness or edema.  Lymphadenopathy:    He has no cervical adenopathy.    He has no axillary adenopathy.       Right: No inguinal and no supraclavicular adenopathy present.       Left: No inguinal and no supraclavicular adenopathy present.  Neurological: He is alert and oriented to person, place, and time.  Skin: Skin is warm and dry. No rash noted. He is not diaphoretic. No erythema.  Psychiatric: Mood, affect and judgment normal.  Nursing note and vitals reviewed.   Infusion on 05/20/2018   Component Date Value Ref Range Status  . WBC 05/20/2018 8.9  4.0 - 10.5 K/uL Final  . RBC 05/20/2018 4.03* 4.22 - 5.81 MIL/uL Final  . Hemoglobin 05/20/2018 12.3* 13.0 - 17.0 g/dL Final  . HCT 05/20/2018 36.2* 39.0 - 52.0 % Final  . MCV 05/20/2018 89.8  80.0 - 100.0 fL Final  . MCH 05/20/2018 30.5  26.0 - 34.0 pg Final  . MCHC 05/20/2018 34.0  30.0 - 36.0 g/dL Final  . RDW 05/20/2018 17.6* 11.5 - 15.5 % Final  . Platelets 05/20/2018 341  150 - 400 K/uL Final  . nRBC 05/20/2018 0.0  0.0 - 0.2 % Final  . Neutrophils Relative % 05/20/2018 86  % Final  . Neutro Abs 05/20/2018 7.8* 1.7 - 7.7 K/uL Final  . Lymphocytes Relative 05/20/2018 6  % Final  . Lymphs Abs 05/20/2018 0.5* 0.7 - 4.0 K/uL Final  . Monocytes Relative 05/20/2018 5  % Final  . Monocytes Absolute 05/20/2018 0.5  0.1 - 1.0 K/uL Final  . Eosinophils Relative 05/20/2018 1  % Final  . Eosinophils Absolute 05/20/2018 0.0  0.0 - 0.5 K/uL Final  . Basophils Relative 05/20/2018 1  % Final  . Basophils Absolute 05/20/2018 0.1  0.0 - 0.1 K/uL Final  . Immature Granulocytes 05/20/2018 1  % Final  . Abs Immature Granulocytes 05/20/2018 0.06  0.00 - 0.07 K/uL Final   Performed at [facility], [address] [place], [place] [address]    Assessment:  NERY FRAPPIER is a 52 y.o. 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  [facility] pathology revealed necrotic tissue. [facility] consultation revealed neoplastic cells are positive for CD20, BCL-6, MUM-1, and BCL-2. The neoplastic cells were negative for CD30, CD3, , CD10, c-MYC, EBV ISH, AE1/3, and PAX-8. Ki67 is 60-70%. 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 The absence of CD30 expression makes primary mediastinal large B-cell lymphoma less likely. FISH for MYC rearrangement was negative. IPI score, age adjusted IPI, are low (1) and NCCN IPI score is low-intermediate (2).  He presented  with upper chest fullness with radiation to his neck and arm. He has had B symptoms (sweats and weight loss) worrisome for lymphoma. LDHis 273. Uric acid was normal.  Beta-HCG and AFP were normal on 01/16/2018.  Chest CT angiogramon 01/15/2018 revealed an 11.3 x 8.0 cm anterior mediastinal  mass.  Abdomen and pelvic CT on 01/16/2018 revealed a 6.3 x 5.6 cm soft tissue mass or medium density fluid filling the urinary bladder.  There was a 1.5 cm mildly complex lower pole right renal cyst. There was a 1.9 cm indeterminate left adrenal mass.  There was a small pericardial effusion.  Renal ultrasound on 01/25/2018 revealed no evidence of bladder mass.  Finding on prior CT exam likely represented excretion of residual contrast material from a CTA chest exam performed 1 day prior   PET scan on 01/26/2018 revealed an 11.1 cm intensely hypermetabolic and partially necrotic right anterior mediastinal mass, compatible with malignancy. There was diffuse marrow hypermetabolism, nonspecific, cannot exclude an infiltrative neoplastic marrow process. There was no focal skeletal hypermetabolism. There was no focal bone lesions on the CT images.  There was nonspecific mild heterogeneous prostatic hypermetabolism.  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    Bone marrow biopsy on 02/03/2018 revealed a slightly hypercellular bone marrow for age with trilineage hematopoiesis.  There was no monoclonal B-cell population or abnormal T-cell phenotype identified.  Cytogenetics were normal (46, XY).  Echo on 01/15/2018 revealed an EF of 55-60%.  Echo on 04/22/2018 revealed an EF of 50 to 55%. There was evidence of probable severe hypokinesis of the mid apical-anteroseptal, anterior, and apical myocardium.  Treadmill Myoview on 04/26/2018 revealed a low risk study with a small defect present in the apex location that was partially reversible and could represent  small previous myocardial infarction. However, and apical thinning artifact could not be excluded.  Nuclear stress EF: 50%.  Echo on 05/17/2018 revealed an EF of 50-55%.  Hepatitis B, hepatitis C, and HIV testing was negative on 02/10/2018.  G6PD assay was normal.  He s/p 4 cycles of RCHOP chemotherapy (02/16/2018 - 04/29/2018) with Neulasta support.  Chest, abdomen, and pelvic CT on 04/19/2018 revealed the anterior mediastinal mass had significantly reduced in size (10.1 x 6.9 cm to 6.8 by 4.5 cm).  The smaller left eccentric mediastinal lymph node measured 1.4 x 4.7 cm, previously 2.0 x 3.2 cm.  There was no appreciable adenopathy in the abdomen/pelvis.  There were bilateral hypodense renal lesions technically too small to characterize, including the right kidney lower pole lesion.   He was diagnosed with T6-T7 herpes zoster on 03/19/2018.  He was treated with valacyclovir.  He is on prophylactic valacyclovir.    Symptomatically, patient is doing well. He denies concerns today in clinic. No B symptoms. He denies chest pain and shortness of breath. No recent infections. Exam is stable.  WBC 8900 ([facility] 7800).  Glucose 129 mg/dL.  Plan: 1. Labs today:  CBC with diff, CMP, LDH, uric acid. 2. DLBCL Doing well overall. Tolerating treatments with minimal side effects. Labs reviewed. Blood counts stable and adequate enough for treatment. Will proceed with cycle #5 RCHOP. Discuss symptom management.  Patient has antiemetics and pain medications at home to use on a PRN basis. Patient  advising that the  prescribed interventions are adequate at this point. Continue all medications as previously prescribed.  3. Chemotherapy-induced neuropathy  Patient has stable neuropathy in his fingertips only.[name]   He notes that his neuropathy does not impose any functional limitations.  No intervention required at this time. Monitor closely for progression.  4. Cardiac wall motion abnormalities  Recent repeat  echocardiogram reveal an LVEF of 50-55%.  Mild hypokinesis of the mid-apicalanteroseptal myocardium.   Followed by cardiology Fletcher Anon, MD) -  does not need repeat echocardiogram prior to next chemotherapy cycle.   Continue on carvedilol 3.125 BID.  5. Hypokalemia  Potassium stable at 3.6 mmol/L.  Continues on oral KCl 10 mEq daily.  Continue routine lab monitoring. 6. Tumor lysis monitoring  Labs reviewed.  No evidence of TLS.  Continue to maintain adequate hydration.  Continue prophylactic allopurinol dose as previously prescribed.  Continue routine lab monitoring 7. RTC on 06/10/2018 for MD assessment, labs (CBC with differential, CMP, LDH, uric acid), and cycle #6 RCHOP with Neulasta support.   Honor Loh, NP  05/20/2018, 10:12 AM  I saw and evaluated the patient, participating in the key portions of the service and reviewing pertinent diagnostic studies and records.  I reviewed the nurse practitioner's note and agree with the findings and the plan.  The assessment and plan were discussed with the patient.  Multiple questions were asked by the patient and answered.   Nolon Stalls, MD 05/20/2018,10:12 AM

## 2018-05-20 NOTE — Progress Notes (Signed)
Patient here for follow up. No concerns voiced.  °

## 2018-05-21 ENCOUNTER — Inpatient Hospital Stay: Payer: Commercial Managed Care - PPO | Attending: Hematology and Oncology

## 2018-05-21 DIAGNOSIS — Z5111 Encounter for antineoplastic chemotherapy: Secondary | ICD-10-CM | POA: Diagnosis not present

## 2018-05-21 DIAGNOSIS — C8522 Mediastinal (thymic) large B-cell lymphoma, intrathoracic lymph nodes: Secondary | ICD-10-CM | POA: Insufficient documentation

## 2018-05-21 DIAGNOSIS — Z5189 Encounter for other specified aftercare: Secondary | ICD-10-CM | POA: Diagnosis not present

## 2018-05-21 DIAGNOSIS — C8510 Unspecified B-cell lymphoma, unspecified site: Secondary | ICD-10-CM

## 2018-05-21 MED ORDER — PEGFILGRASTIM INJECTION 6 MG/0.6ML ~~LOC~~
6.0000 mg | PREFILLED_SYRINGE | Freq: Once | SUBCUTANEOUS | Status: AC
  Administered 2018-05-21: 6 mg via SUBCUTANEOUS

## 2018-05-22 ENCOUNTER — Other Ambulatory Visit: Payer: Self-pay | Admitting: Urgent Care

## 2018-05-25 ENCOUNTER — Ambulatory Visit (INDEPENDENT_AMBULATORY_CARE_PROVIDER_SITE_OTHER): Payer: Commercial Managed Care - PPO | Admitting: Cardiovascular Disease

## 2018-05-25 ENCOUNTER — Encounter: Payer: Self-pay | Admitting: Cardiovascular Disease

## 2018-05-25 VITALS — BP 92/68 | HR 86 | Ht 72.0 in | Wt 259.5 lb

## 2018-05-25 DIAGNOSIS — I10 Essential (primary) hypertension: Secondary | ICD-10-CM

## 2018-05-25 DIAGNOSIS — E785 Hyperlipidemia, unspecified: Secondary | ICD-10-CM | POA: Diagnosis not present

## 2018-05-25 NOTE — Progress Notes (Signed)
Cardiology Office Note   Date:  05/25/2018   ID:  Ronnie Mcintyre, DOB 04-11-1966, MRN [id number]  PCP:  [name], MD  Cardiologist:   [name], MD   Chief Complaint  Patient presents with  . OTHER    Myoview/echo no complaints today. Meds reviewed verbally with pt.      History of Present Illness: Ronnie Mcintyre is a 52 y.o. 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   The patient has known history of obesity, hypertension and hyperlipidemia.  He has no prior cardiac history.  He is not a smoker or diabetic.  He has no family history of coronary artery disease. 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  He had an echocardiogram done in June 2019 before chemotherapy which showed an EF of 55 to 60% with no wall motion abnormalities and mildly dilated aortic root at 40 mmHg.  He had a repeat echocardiogram after third cycle of chemotherapy which showed an EF of 50 to 55% with probable severe hypokinesis of the mid to distal anteroseptal, anterior and apical myocardium.   The patient had no cardiac symptoms whatsoever. I referred him for a treadmill Myoview and while he was able to exercise for 4 minutes and 45 seconds.  Perfusion was suboptimal overall but there was a small apical defect likely due to to apical thinning although a small infarct could not be excluded.  Nonetheless, it was a low risk study with EF of 50%. After that, I started the patient on small dose carvedilol given his resting tachycardia.  He has been doing well and continues to deny any chest pain or significant dyspnea.  His blood pressure has been running on the low side.   Past Medical History:  Diagnosis Date  . Cancer ([place])   . Hypertension     Past Surgical History:  Procedure Laterality Date  . ANKLE SURGERY Right   . PORTACATH PLACEMENT Right 02/11/2018   Procedure: INSERTION PORT-A-CATH;   Surgeon: Jules Husbands, MD;  Location: [facility] ORS;  Service: General;  Laterality: Right;     Current Outpatient Medications  Medication Sig Dispense Refill  . acetaminophen (TYLENOL) 325 MG tablet Take 2 tablets (650 mg total) by mouth every 6 (six) hours as needed for mild pain (or Fever >/= 101).    [name] allopurinol (ZYLOPRIM) 300 MG tablet Take 1 tablet (300 mg total) by mouth daily. 90 tablet 1  . aspirin 81 MG tablet Take 81 mg by mouth daily.     . carvedilol (COREG) 3.125 MG tablet Take 1 tablet (3.125 mg total) by mouth 2 (two) times daily with a meal. 180 tablet 3  . felodipine (PLENDIL) 2.5 MG 24 hr tablet Take 2.5 mg by mouth daily.    . fluticasone (FLONASE) 50 MCG/ACT nasal spray Place 2 sprays into both nostrils daily. 16 g 0  . hydrocortisone 1 % lotion Apply 1 application topically 2 (two) times daily as needed for itching.    . lidocaine-prilocaine (EMLA) cream Apply to affected area once 30 g 3  . loratadine (CLARITIN) 10 MG tablet Take 10 mg by mouth daily as needed for allergies.     [name] LORazepam (ATIVAN) 0.5 MG tablet Take 1 tablet (0.5 mg total) by mouth every 6 (six) hours as needed (Nausea or vomiting). 30 tablet 0  . losartan-hydrochlorothiazide (HYZAAR) 100-25 MG tablet Take 1 tablet by mouth daily.    [name]  ondansetron (ZOFRAN) 8 MG tablet Take 1 tablet (8 mg total) by mouth 2 (two) times daily as needed. Start on day 3 after cyclophosphamide chemotherapy. 30 tablet 1  . oxyCODONE (OXY IR/ROXICODONE) 5 MG immediate release tablet Take 1 tablet (5 mg total) by mouth every 6 (six) hours as needed for moderate pain. 30 tablet 0  . pantoprazole (PROTONIX) 40 MG tablet TAKE 1 TABLET (40 MG TOTAL) BY MOUTH DAILY. USE DAILY WHILE ON PREDNISONE FOR GI PROPHYLAXIS. 90 tablet 1  . potassium chloride SA (K-DUR,KLOR-CON) 10 MEQ tablet Take 1 tablet (10 mEq) daily. 30 tablet 3  . predniSONE (DELTASONE) 50 MG tablet TAKE 2 TABLETS (100 MG TOTAL) BY MOUTH DAILY. TAKE ON DAYS 1-5 OF  CHEMOTHERAPY. 60 tablet 1  . simvastatin (ZOCOR) 40 MG tablet Take 40 mg by mouth daily.    . valACYclovir (VALTREX) 500 MG tablet TAKE 1 TABLET BY MOUTH TWICE A DAY 60 tablet 0   No current facility-administered medications for this visit.     Allergies:   Patient has no known allergies.    Social History:  The patient  reports that he has never smoked. He has never used smokeless tobacco. He reports that he drinks alcohol. He reports that he does not use drugs.   Family History:  The patient's family history includes Hypertension in his father and mother; Multiple myeloma in his father.    ROS:  Please see the history of present illness.   Otherwise, review of systems are positive for none.   All other systems are reviewed and negative.    PHYSICAL EXAM: VS:  BP 92/68 (BP Location: Left Arm, Patient Position: Sitting, Cuff Size: Large)   Pulse 86   Ht 6' (1.829 m)   Wt 259 lb 8 oz (117.7 kg)   BMI 35.19 kg/m  , BMI Body mass index is 35.19 kg/m. GEN: Well nourished, well developed, in no acute distress  HEENT: normal  Neck: no JVD, carotid bruits, or masses Cardiac: RRR; no murmurs, rubs, or gallops,no edema  Respiratory:  clear to auscultation bilaterally, normal work of breathing GI: soft, nontender, nondistended, + BS MS: no deformity or atrophy  Skin: warm and dry, no rash Neuro:  Strength and sensation are intact Psych: euthymic mood, full affect   EKG:  EKG is ordered today. The ekg ordered today demonstrates normal sinus rhythm with left axis deviation, old inferior and anteroseptal infarct.   Recent Labs: 04/22/2018: Magnesium 1.9 05/20/2018: ALT 30; BUN 13; Creatinine, Ser 1.01; Hemoglobin 12.3; Platelets 341; Potassium 3.6; Sodium 135    Lipid Panel No results found for: CHOL, TRIG, HDL, CHOLHDL, VLDL, LDLCALC, LDLDIRECT    Wt Readings from Last 3 Encounters:  05/25/18 259 lb 8 oz (117.7 kg)  05/20/18 255 lb 3.2 oz (115.8 kg)  04/29/18 254 lb 1.6 oz  (115.3 kg)      PAD Screen 04/22/2018  Previous PAD dx? No  Previous surgical procedure? No  Pain with walking? No  Feet/toe relief with dangling? No  Painful, non-healing ulcers? No  Extremities discolored? No      ASSESSMENT AND PLAN:  1.  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  The patient can continue with the last cycle of chemotherapy.  Continue treatment with small dose carvedilol and losartan-hydrochlorothiazide.    2.  Essential hypertension: Blood pressure is mildly low after starting carvedilol.  I discontinued felodipine.  If  blood pressure continues to be on the low side, we can consider decreasing the dose of losartan-hydrochlorothiazide.  3.  Hyperlipidemia: Currently on simvastatin.    Disposition:   FU with me in 3 months.  Signed,  [name], MD  05/25/2018 3:53 PM    [place]

## 2018-05-25 NOTE — Patient Instructions (Signed)
Medication Instructions:  STOP the Felodipine (Plendil)  If you need a refill on your cardiac medications before your next appointment, please call your pharmacy.   Lab work: None ordered  Testing/Procedures: None ordered  Follow-Up: At [facility], you and your health needs are our priority.  As part of our continuing mission to provide you with exceptional heart care, we have created designated Provider Care Teams.  These Care Teams include your primary Cardiologist (physician) and Advanced Practice Providers (APPs -  Physician Assistants and Nurse Practitioners) who all work together to provide you with the care you need, when you need it. You will need a follow up appointment in 3 months. You may see Dr. Fletcher Anon or one of the following Advanced Practice Providers on your designated Care Team:   Murray Hodgkins, NP Christell Faith, PA-C . Marrianne Mood, PA-C

## 2018-06-10 ENCOUNTER — Inpatient Hospital Stay: Payer: Commercial Managed Care - PPO

## 2018-06-10 ENCOUNTER — Encounter: Payer: Self-pay | Admitting: Hematology and Oncology

## 2018-06-10 ENCOUNTER — Inpatient Hospital Stay (HOSPITAL_BASED_OUTPATIENT_CLINIC_OR_DEPARTMENT_OTHER): Payer: Commercial Managed Care - PPO | Admitting: Hematology and Oncology

## 2018-06-10 VITALS — BP 108/74 | HR 98 | Temp 98.4°F | Resp 18 | Wt 257.6 lb

## 2018-06-10 DIAGNOSIS — C8522 Mediastinal (thymic) large B-cell lymphoma, intrathoracic lymph nodes: Secondary | ICD-10-CM | POA: Diagnosis not present

## 2018-06-10 DIAGNOSIS — E876 Hypokalemia: Secondary | ICD-10-CM | POA: Diagnosis not present

## 2018-06-10 DIAGNOSIS — C8510 Unspecified B-cell lymphoma, unspecified site: Secondary | ICD-10-CM

## 2018-06-10 DIAGNOSIS — Z5111 Encounter for antineoplastic chemotherapy: Secondary | ICD-10-CM

## 2018-06-10 DIAGNOSIS — G620 Drug-induced polyneuropathy: Secondary | ICD-10-CM

## 2018-06-10 DIAGNOSIS — T451X5A Adverse effect of antineoplastic and immunosuppressive drugs, initial encounter: Secondary | ICD-10-CM

## 2018-06-10 DIAGNOSIS — Z5112 Encounter for antineoplastic immunotherapy: Secondary | ICD-10-CM

## 2018-06-10 DIAGNOSIS — I5189 Other ill-defined heart diseases: Secondary | ICD-10-CM | POA: Diagnosis not present

## 2018-06-10 DIAGNOSIS — B009 Herpesviral infection, unspecified: Secondary | ICD-10-CM

## 2018-06-10 LAB — CBC WITH DIFFERENTIAL/PLATELET
Abs Immature Granulocytes: 0.05 10*3/uL (ref 0.00–0.07)
Basophils Absolute: 0.1 10*3/uL (ref 0.0–0.1)
Basophils Relative: 1 %
Eosinophils Absolute: 0 10*3/uL (ref 0.0–0.5)
Eosinophils Relative: 1 %
HCT: 37 % — ABNORMAL LOW (ref 39.0–52.0)
Hemoglobin: 12.3 g/dL — ABNORMAL LOW (ref 13.0–17.0)
Immature Granulocytes: 1 %
Lymphocytes Relative: 6 %
Lymphs Abs: 0.5 10*3/uL — ABNORMAL LOW (ref 0.7–4.0)
MCH: 30.1 pg (ref 26.0–34.0)
MCHC: 33.2 g/dL (ref 30.0–36.0)
MCV: 90.5 fL (ref 80.0–100.0)
Monocytes Absolute: 0.6 10*3/uL (ref 0.1–1.0)
Monocytes Relative: 7 %
Neutro Abs: 7.2 10*3/uL (ref 1.7–7.7)
Neutrophils Relative %: 84 %
Platelets: 325 10*3/uL (ref 150–400)
RBC: 4.09 MIL/uL — ABNORMAL LOW (ref 4.22–5.81)
RDW: 16.5 % — ABNORMAL HIGH (ref 11.5–15.5)
WBC: 8.4 10*3/uL (ref 4.0–10.5)
nRBC: 0 % (ref 0.0–0.2)

## 2018-06-10 LAB — COMPREHENSIVE METABOLIC PANEL
ALT: 30 U/L (ref 0–44)
AST: 29 U/L (ref 15–41)
Albumin: 4 g/dL (ref 3.5–5.0)
Alkaline Phosphatase: 61 U/L (ref 38–126)
Anion gap: 10 (ref 5–15)
BUN: 14 mg/dL (ref 6–20)
CO2: 25 mmol/L (ref 22–32)
Calcium: 9.3 mg/dL (ref 8.9–10.3)
Chloride: 102 mmol/L (ref 98–111)
Creatinine, Ser: 1.06 mg/dL (ref 0.61–1.24)
GFR calc Af Amer: >60 mL/min (ref >60)
GFR calc non Af Amer: >60 mL/min (ref >60)
Glucose, Bld: 127 mg/dL — ABNORMAL HIGH (ref 70–99)
Potassium: 3.5 mmol/L (ref 3.5–5.1)
Sodium: 137 mmol/L (ref 135–145)
Total Bilirubin: 1 mg/dL (ref 0.3–1.2)
Total Protein: 6.8 g/dL (ref 6.5–8.1)

## 2018-06-10 LAB — LACTATE DEHYDROGENASE: LDH: 167 U/L (ref 98–192)

## 2018-06-10 LAB — MAGNESIUM: Magnesium: 1.9 mg/dL (ref 1.7–2.4)

## 2018-06-10 LAB — URIC ACID: Uric Acid, Serum: 5.2 mg/dL (ref 3.7–8.6)

## 2018-06-10 MED ORDER — SODIUM CHLORIDE 0.9 % IV SOLN
Freq: Once | INTRAVENOUS | Status: AC
  Administered 2018-06-10: 11:00:00 via INTRAVENOUS
  Filled 2018-06-10: qty 250

## 2018-06-10 MED ORDER — HEPARIN SOD (PORK) LOCK FLUSH 100 UNIT/ML IV SOLN
500.0000 [IU] | Freq: Once | INTRAVENOUS | Status: AC
  Administered 2018-06-10: 500 [IU] via INTRAVENOUS
  Filled 2018-06-10: qty 5

## 2018-06-10 MED ORDER — SODIUM CHLORIDE 0.9 % IV SOLN
750.0000 mg/m2 | Freq: Once | INTRAVENOUS | Status: AC
  Administered 2018-06-10: 1820 mg via INTRAVENOUS
  Filled 2018-06-10: qty 91

## 2018-06-10 MED ORDER — ACETAMINOPHEN 325 MG PO TABS
650.0000 mg | ORAL_TABLET | Freq: Once | ORAL | Status: AC
  Administered 2018-06-10: 650 mg via ORAL
  Filled 2018-06-10: qty 2

## 2018-06-10 MED ORDER — SODIUM CHLORIDE 0.9% FLUSH
10.0000 mL | INTRAVENOUS | Status: DC | PRN
  Administered 2018-06-10: 10 mL via INTRAVENOUS
  Filled 2018-06-10: qty 10

## 2018-06-10 MED ORDER — SODIUM CHLORIDE 0.9 % IV SOLN
375.0000 mg/m2 | Freq: Once | INTRAVENOUS | Status: AC
  Administered 2018-06-10: 900 mg via INTRAVENOUS
  Filled 2018-06-10: qty 50

## 2018-06-10 MED ORDER — DIPHENHYDRAMINE HCL 25 MG PO CAPS
50.0000 mg | ORAL_CAPSULE | Freq: Once | ORAL | Status: AC
  Administered 2018-06-10: 50 mg via ORAL
  Filled 2018-06-10: qty 2

## 2018-06-10 MED ORDER — PALONOSETRON HCL INJECTION 0.25 MG/5ML
0.2500 mg | Freq: Once | INTRAVENOUS | Status: AC
  Administered 2018-06-10: 0.25 mg via INTRAVENOUS
  Filled 2018-06-10: qty 5

## 2018-06-10 MED ORDER — DEXAMETHASONE SODIUM PHOSPHATE 10 MG/ML IJ SOLN
10.0000 mg | Freq: Once | INTRAMUSCULAR | Status: AC
  Administered 2018-06-10: 10 mg via INTRAVENOUS
  Filled 2018-06-10: qty 1

## 2018-06-10 MED ORDER — DOXORUBICIN HCL CHEMO IV INJECTION 2 MG/ML
50.0000 mg/m2 | Freq: Once | INTRAVENOUS | Status: AC
  Administered 2018-06-10: 122 mg via INTRAVENOUS
  Filled 2018-06-10: qty 61

## 2018-06-10 MED ORDER — VINCRISTINE SULFATE CHEMO INJECTION 1 MG/ML
2.0000 mg | Freq: Once | INTRAVENOUS | Status: AC
  Administered 2018-06-10: 2 mg via INTRAVENOUS
  Filled 2018-06-10: qty 2

## 2018-06-10 NOTE — Progress Notes (Signed)
Patient offers no complaints today. 

## 2018-06-10 NOTE — Progress Notes (Signed)
[facility] day:  06/10/2018   Chief Complaint: Ronnie Mcintyre is a 52 y.o. male with a large B-cell lymphoma who is seen for assessment prior to cycle #6 RCHOP chemotherapy.  HPI:  The patient was last seen in the medical oncology clinic on 05/20/2018.  At that time, he was doing well. He denied concerns today in clinic. He denied any B symptoms. He denied chest pain and shortness of breath.  Exam was stable.  WBC was 8900 ([place] 7800).  He received cycle #5 RCHOP chemotherapy with Neulasta support.  He saw Dr. Fletcher Anon on 05/25/2018.  Notes reviewed.  He was felt to be able to continue with his last cycle of chemotherapy.  He was to continue with the small dose of carvedilol and losartan-HCTZ.  During the interim, he has done well.  He notes no issues.  He is sleeping well.  He denies any chest pain or shortness of breath.  He remains off work.  He plans to return to work after the first of the year.   Past Medical History:  Diagnosis Date  . Cancer ([place])   . Hypertension     Past Surgical History:  Procedure Laterality Date  . ANKLE SURGERY Right   . PORTACATH PLACEMENT Right 02/11/2018   Procedure: INSERTION PORT-A-CATH;  Surgeon: Jules Husbands, MD;  Location: [facility] ORS;  Service: General;  Laterality: Right;    Family History  Problem Relation Age of Onset  . Hypertension Mother   . Hypertension Father   . Multiple myeloma Father     Social History:  reports that he has never smoked. He has never used smokeless tobacco. He reports that he drinks alcohol. He reports that he does not use drugs.  He drinks a beer 1-2 x/week.  He works for [employer].  He lives in [place].  His is alone today.  Allergies: No Known Allergies  Current Medications: Current Outpatient Medications  Medication Sig Dispense Refill  . acetaminophen (TYLENOL) 325 MG tablet Take 2 tablets (650 mg total) by mouth every 6 (six) hours as needed for mild  pain (or Fever >/= 101).    [name] allopurinol (ZYLOPRIM) 300 MG tablet Take 1 tablet (300 mg total) by mouth daily. 90 tablet 1  . aspirin 81 MG tablet Take 81 mg by mouth daily.     . carvedilol (COREG) 3.125 MG tablet Take 1 tablet (3.125 mg total) by mouth 2 (two) times daily with a meal. 180 tablet 3  . fluticasone (FLONASE) 50 MCG/ACT nasal spray Place 2 sprays into both nostrils daily. 16 g 0  . hydrocortisone 1 % lotion Apply 1 application topically 2 (two) times daily as needed for itching.    . lidocaine-prilocaine (EMLA) cream Apply to affected area once 30 g 3  . loratadine (CLARITIN) 10 MG tablet Take 10 mg by mouth daily as needed for allergies.     [name] LORazepam (ATIVAN) 0.5 MG tablet Take 1 tablet (0.5 mg total) by mouth every 6 (six) hours as needed (Nausea or vomiting). 30 tablet 0  . losartan-hydrochlorothiazide (HYZAAR) 100-25 MG tablet Take 1 tablet by mouth daily.    . ondansetron (ZOFRAN) 8 MG tablet Take 1 tablet (8 mg total) by mouth 2 (two) times daily as needed. Start on day 3 after cyclophosphamide chemotherapy. 30 tablet 1  . oxyCODONE (OXY IR/ROXICODONE) 5 MG immediate release tablet Take 1 tablet (5 mg total) by mouth every 6 (six)  hours as needed for moderate pain. 30 tablet 0  . pantoprazole (PROTONIX) 40 MG tablet TAKE 1 TABLET (40 MG TOTAL) BY MOUTH DAILY. USE DAILY WHILE ON PREDNISONE FOR GI PROPHYLAXIS. 90 tablet 1  . potassium chloride SA (K-DUR,KLOR-CON) 10 MEQ tablet Take 1 tablet (10 mEq) daily. 30 tablet 3  . predniSONE (DELTASONE) 50 MG tablet TAKE 2 TABLETS (100 MG TOTAL) BY MOUTH DAILY. TAKE ON DAYS 1-5 OF CHEMOTHERAPY. 60 tablet 1  . simvastatin (ZOCOR) 40 MG tablet Take 40 mg by mouth daily.    . valACYclovir (VALTREX) 500 MG tablet TAKE 1 TABLET BY MOUTH TWICE A DAY 60 tablet 0   No current facility-administered medications for this visit.    Facility-Administered Medications Ordered in Other Visits  Medication Dose Route Frequency Provider Last Rate Last  Dose  . heparin lock flush 100 unit/mL  500 Units Intravenous Once Corcoran, Melissa C, MD      . sodium chloride flush (NS) 0.9 % injection 10 mL  10 mL Intravenous PRN Lequita Asal, MD   10 mL at 06/10/18 0915    Review of Systems  Constitutional: Negative for chills, diaphoresis, fever, malaise/fatigue and weight loss (up 2 pounds).       No issues.  HENT: Negative.  Negative for congestion, ear discharge, ear pain, nosebleeds, sinus pain and tinnitus.   Eyes: Negative.  Negative for blurred vision, double vision, photophobia, pain and discharge.  Respiratory: Negative.  Negative for cough, hemoptysis, sputum production, shortness of breath and wheezing.   Cardiovascular: Negative.  Negative for chest pain, palpitations, orthopnea, leg swelling and PND.  Gastrointestinal: Negative.  Negative for abdominal pain, blood in stool, constipation, diarrhea, melena, nausea and vomiting.  Genitourinary: Negative.  Negative for dysuria, frequency, hematuria and urgency.  Musculoskeletal: Negative.  Negative for back pain, falls, joint pain, myalgias and neck pain.  Skin: Negative.  Negative for itching and rash.  Neurological: Positive for sensory change (mild (stable) to fingertips). Negative for dizziness, tingling, tremors, speech change, focal weakness, weakness and headaches.  Endo/Heme/Allergies: Does not bruise/bleed easily.  Psychiatric/Behavioral: Negative for depression and memory loss. The patient is not nervous/anxious and does not have insomnia (sleeping well).   All other systems reviewed and are negative.  Performance status (ECOG): 1  Vital Signs BP 108/74 (BP Location: Left Arm, Patient Position: Sitting)   Pulse 98   Temp 98.4 F (36.9 C) (Tympanic)   Resp 18   Wt 257 lb 9 oz (116.8 kg)   BMI 34.93 kg/m   Physical Exam  Constitutional: He is oriented to person, place, and time and well-developed, well-nourished, and in no distress. No distress.  HENT:  Head:  Normocephalic and atraumatic.  Mouth/Throat: Oropharynx is clear and moist and mucous membranes are normal.  Alopecia.  Eyes: Pupils are equal, round, and reactive to light. EOM are normal. Right eye exhibits no discharge. Left eye exhibits no discharge. No scleral icterus.  Blue.  Neck: Normal range of motion. Neck supple. No JVD present.  Cardiovascular: Regular rhythm, normal heart sounds and intact distal pulses. Tachycardia present. Exam reveals no gallop and no friction rub.  No murmur heard. Pulmonary/Chest: Effort normal and breath sounds normal. No respiratory distress. He has no wheezes. He has no rales.  Abdominal: Soft. Bowel sounds are normal. He exhibits no distension and no mass. There is no abdominal tenderness. There is no rebound and no guarding.  Musculoskeletal: Normal range of motion.        General:  No tenderness or edema.  Lymphadenopathy:    He has no cervical adenopathy.    He has no axillary adenopathy.       Right: No inguinal and no supraclavicular adenopathy present.       Left: No inguinal and no supraclavicular adenopathy present.  Neurological: He is alert and oriented to person, place, and time. Gait normal.  Able to walk on heels (no foot drop).  Skin: Skin is warm and dry. No rash noted. He is not diaphoretic. No erythema. No pallor.  Psychiatric: Mood, affect and judgment normal.  Nursing note and vitals reviewed.   Infusion on 06/10/2018  Component Date Value Ref Range Status  . LDH 06/10/2018 167  98 - 192 U/L Final   Performed at [facility], [address] [place], [place] [address]  . Sodium 06/10/2018 137  135 - 145 mmol/L Final  . Potassium 06/10/2018 3.5  3.5 - 5.1 mmol/L Final  . Chloride 06/10/2018 102  98 - 111 mmol/L Final  . CO2 06/10/2018 25  22 - 32 mmol/L Final  . Glucose, Bld 06/10/2018 127* 70 - 99 mg/dL Final  . BUN 06/10/2018 14  6 - 20 mg/dL Final  . Creatinine, Ser 06/10/2018 1.06  0.61 - 1.24 mg/dL Final  . Calcium  06/10/2018 9.3  8.9 - 10.3 mg/dL Final  . Total Protein 06/10/2018 6.8  6.5 - 8.1 g/dL Final  . Albumin 06/10/2018 4.0  3.5 - 5.0 g/dL Final  . AST 06/10/2018 29  15 - 41 U/L Final  . ALT 06/10/2018 30  0 - 44 U/L Final  . Alkaline Phosphatase 06/10/2018 61  38 - 126 U/L Final  . Total Bilirubin 06/10/2018 1.0  0.3 - 1.2 mg/dL Final  . GFR calc non Af Amer 06/10/2018 >60  >60 mL/min Final  . GFR calc Af Amer 06/10/2018 >60  >60 mL/min Final   Comment: (NOTE) The eGFR has been calculated using the CKD EPI equation. This calculation has not been validated in all clinical situations. eGFR's persistently <60 mL/min signify possible Chronic Kidney Disease.   Georgiann Hahn gap 06/10/2018 10  5 - 15 Final   Performed at [facility], [address] [place], [place] [address]  . WBC 06/10/2018 8.4  4.0 - 10.5 K/uL Final  . RBC 06/10/2018 4.09* 4.22 - 5.81 MIL/uL Final  . Hemoglobin 06/10/2018 12.3* 13.0 - 17.0 g/dL Final  . HCT 06/10/2018 37.0* 39.0 - 52.0 % Final  . MCV 06/10/2018 90.5  80.0 - 100.0 fL Final  . MCH 06/10/2018 30.1  26.0 - 34.0 pg Final  . MCHC 06/10/2018 33.2  30.0 - 36.0 g/dL Final  . RDW 06/10/2018 16.5* 11.5 - 15.5 % Final  . Platelets 06/10/2018 325  150 - 400 K/uL Final  . nRBC 06/10/2018 0.0  0.0 - 0.2 % Final  . Neutrophils Relative % 06/10/2018 84  % Final  . Neutro Abs 06/10/2018 7.2  1.7 - 7.7 K/uL Final  . Lymphocytes Relative 06/10/2018 6  % Final  . Lymphs Abs 06/10/2018 0.5* 0.7 - 4.0 K/uL Final  . Monocytes Relative 06/10/2018 7  % Final  . Monocytes Absolute 06/10/2018 0.6  0.1 - 1.0 K/uL Final  . Eosinophils Relative 06/10/2018 1  % Final  . Eosinophils Absolute 06/10/2018 0.0  0.0 - 0.5 K/uL Final  . Basophils Relative 06/10/2018 1  % Final  . Basophils Absolute 06/10/2018 0.1  0.0 - 0.1 K/uL Final  . Immature Granulocytes 06/10/2018  1  % Final  . Abs Immature Granulocytes 06/10/2018 0.05  0.00 - 0.07 K/uL Final   Performed at [facility],  [address] [place], [place] [address]  . Magnesium 06/10/2018 1.9  1.7 - 2.4 mg/dL Final   Performed at [facility], [address] [place], [place] [address]    Assessment:  Ronnie Mcintyre is a 52 y.o. 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  [facility] pathology revealed necrotic tissue. [facility] consultation revealed neoplastic cells are positive for CD20, BCL-6, MUM-1, and BCL-2. The neoplastic cells were negative for CD30, CD3, , CD10, c-MYC, EBV ISH, AE1/3, and PAX-8. Ki67 is 60-70%. 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 The absence of CD30 expression makes primary mediastinal large B-cell lymphoma less likely. FISH for MYC rearrangement was negative. IPI score, age adjusted IPI, are low (1) and NCCN IPI score is low-intermediate (2).  He presented with upper chest fullness with radiation to his neck and arm. He has had B symptoms (sweats and weight loss) worrisome for lymphoma. LDHis 273. Uric acid was normal.  Beta-HCG and AFP were normal on 01/16/2018.  Chest CT angiogramon 01/15/2018 revealed an 11.3 x 8.0 cm anterior mediastinal mass.  Abdomen and pelvic CT on 01/16/2018 revealed a 6.3 x 5.6 cm soft tissue mass or medium density fluid filling the urinary bladder.  There was a 1.5 cm mildly complex lower pole right renal cyst. There was a 1.9 cm indeterminate left adrenal mass.  There was a small pericardial effusion.  Renal ultrasound on 01/25/2018 revealed no evidence of bladder mass.  Finding on prior CT exam likely represented excretion of residual contrast material from a CTA chest exam performed 1 day prior   PET scan on 01/26/2018 revealed an 11.1 cm intensely hypermetabolic and partially necrotic right anterior mediastinal mass, compatible with malignancy. There was diffuse marrow hypermetabolism, nonspecific, cannot exclude an infiltrative neoplastic marrow  process. There was no focal skeletal hypermetabolism. There was no focal bone lesions on the CT images.  There was nonspecific mild heterogeneous prostatic hypermetabolism.  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    Bone marrow biopsy on 02/03/2018 revealed a slightly hypercellular bone marrow for age with trilineage hematopoiesis.  There was no monoclonal B-cell population or abnormal T-cell phenotype identified.  Cytogenetics were normal (46, XY).  Echo on 01/15/2018 revealed an EF of 55-60%.  Echo on 04/22/2018 revealed an EF of 50 to 55%. There was evidence of probable severe hypokinesis of the mid apical-anteroseptal, anterior, and apical myocardium.  Treadmill Myoview on 04/26/2018 revealed a low risk study with a small defect present in the apex location that was partially reversible and could represent small previous myocardial infarction. However, and apical thinning artifact could not be excluded.  Nuclear stress EF: 50%.  Echo on 05/17/2018 revealed an EF of 50-55%.  Hepatitis B, hepatitis C, and HIV testing was negative on 02/10/2018.  G6PD assay was normal.  He is s/p 5 cycles of RCHOP chemotherapy (02/16/2018 - 05/20/2018) with Neulasta support.  Chest, abdomen, and pelvic CT on 04/19/2018 revealed the anterior mediastinal mass had significantly reduced in size (10.1 x 6.9 cm to 6.8 by 4.5 cm).  The smaller left eccentric mediastinal lymph node measured 1.4 x 4.7 cm, previously 2.0 x 3.2 cm.  There was no appreciable adenopathy in the abdomen/pelvis.  There were bilateral hypodense renal lesions technically too small to characterize, including the right kidney lower pole lesion.   He was diagnosed with T6-T7 herpes zoster on 03/19/2018.  He was treated with valacyclovir.  He is on prophylactic valacyclovir.    Symptomatically, he is doing well.  He denies any B symptoms.  Exam reveals no adenopathy or  hepatosplenomegaly.  Plan: 1. Labs today:  CBC with diff, CMP, Mg, LDH, uric acid. 2. DLBCL: Doing well overall. Tolerating treatments with minimal side effects. Labs reviewed. Blood counts stable and adequate enough for treatment. Will proceed with cycle #6RCHOP. Discuss symptom management.  He has antiemetics and pain medications at home to use on a prn bases.  Interventions are adequate.     Discuss plan for end of therapy PET scan. 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 Neuropathy does not impose any functional limitations. Continue to monitor. 4. Cardiac wall motion abnormalities: Echocardiogram reveal an LVEF of 50-55%. Mild hypokinesis of the mid-apicalanteroseptal myocardium.  Patient followed by cardiology Fletcher Anon, MD) - does not need repeat echo.  Continue on carvedilol 3.125 BID.  5. Hypokalemia: Potassium 3.5. Continue oral KCl 10 mEq daily. Continue routine lab monitoring. 6. Tumor lysis monitoring: Labs reviewed.  No evidence of TLS. Continue to maintain adequate hydration. Continue prophylactic allopurinol. Continue routine lab monitoring 7. Schedule PET scan on 07/05/2018. 8.   RTC 07/06/2018 for MD assessment, labs (CBC with diff, CMP, Mg, LDH, uric acid), and review of imaging    Honor Loh, NP  06/10/2018, 10:31 AM  I saw and evaluated the patient, participating in the key portions of the service and reviewing pertinent diagnostic studies and records.  I reviewed the nurse practitioner's note and agree with the findings and the plan.  The assessment and plan were discussed with the patient.  Multiple questions were asked by the patient and answered.   Nolon Stalls, MD 06/10/2018,10:31 AM

## 2018-06-11 ENCOUNTER — Inpatient Hospital Stay: Payer: Commercial Managed Care - PPO

## 2018-06-11 DIAGNOSIS — C8510 Unspecified B-cell lymphoma, unspecified site: Secondary | ICD-10-CM

## 2018-06-11 DIAGNOSIS — Z5111 Encounter for antineoplastic chemotherapy: Secondary | ICD-10-CM | POA: Diagnosis not present

## 2018-06-11 MED ORDER — PEGFILGRASTIM INJECTION 6 MG/0.6ML ~~LOC~~
6.0000 mg | PREFILLED_SYRINGE | Freq: Once | SUBCUTANEOUS | Status: AC
  Administered 2018-06-11: 6 mg via SUBCUTANEOUS

## 2018-06-17 ENCOUNTER — Other Ambulatory Visit: Payer: Self-pay | Admitting: Hematology and Oncology

## 2018-07-02 ENCOUNTER — Encounter
Admission: RE | Admit: 2018-07-02 | Discharge: 2018-07-02 | Disposition: A | Discharge status: Home / Self Care | Payer: Commercial Managed Care - PPO | Source: Ambulatory Visit | Attending: Urgent Care | Admitting: Urgent Care

## 2018-07-02 DIAGNOSIS — C8510 Unspecified B-cell lymphoma, unspecified site: Secondary | ICD-10-CM | POA: Diagnosis present

## 2018-07-02 LAB — GLUCOSE, CAPILLARY: Glucose-Capillary: 112 mg/dL — ABNORMAL HIGH (ref 70–99)

## 2018-07-02 MED ORDER — FLUDEOXYGLUCOSE F - 18 (FDG) INJECTION
13.3000 | Freq: Once | INTRAVENOUS | Status: AC | PRN
  Administered 2018-07-02: 13.57 via INTRAVENOUS

## 2018-07-06 ENCOUNTER — Encounter: Payer: Self-pay | Admitting: Hematology and Oncology

## 2018-07-06 ENCOUNTER — Inpatient Hospital Stay (HOSPITAL_BASED_OUTPATIENT_CLINIC_OR_DEPARTMENT_OTHER): Payer: Commercial Managed Care - PPO | Admitting: Hematology and Oncology

## 2018-07-06 ENCOUNTER — Inpatient Hospital Stay: Payer: Commercial Managed Care - PPO | Attending: Hematology and Oncology

## 2018-07-06 VITALS — BP 105/73 | HR 108 | Temp 97.6°F | Resp 18 | Wt 257.2 lb

## 2018-07-06 DIAGNOSIS — C8332 Diffuse large B-cell lymphoma, intrathoracic lymph nodes: Secondary | ICD-10-CM | POA: Diagnosis not present

## 2018-07-06 DIAGNOSIS — G620 Drug-induced polyneuropathy: Secondary | ICD-10-CM | POA: Diagnosis not present

## 2018-07-06 DIAGNOSIS — C8510 Unspecified B-cell lymphoma, unspecified site: Secondary | ICD-10-CM

## 2018-07-06 DIAGNOSIS — R17 Unspecified jaundice: Secondary | ICD-10-CM

## 2018-07-06 DIAGNOSIS — E876 Hypokalemia: Secondary | ICD-10-CM | POA: Diagnosis not present

## 2018-07-06 DIAGNOSIS — Z7189 Other specified counseling: Secondary | ICD-10-CM

## 2018-07-06 DIAGNOSIS — T451X5A Adverse effect of antineoplastic and immunosuppressive drugs, initial encounter: Secondary | ICD-10-CM

## 2018-07-06 LAB — COMPREHENSIVE METABOLIC PANEL
ALT: 34 U/L (ref 0–44)
AST: 28 U/L (ref 15–41)
Albumin: 4 g/dL (ref 3.5–5.0)
Alkaline Phosphatase: 57 U/L (ref 38–126)
Anion gap: 8 (ref 5–15)
BUN: 16 mg/dL (ref 6–20)
CO2: 26 mmol/L (ref 22–32)
Calcium: 9.4 mg/dL (ref 8.9–10.3)
Chloride: 105 mmol/L (ref 98–111)
Creatinine, Ser: 1.09 mg/dL (ref 0.61–1.24)
GFR calc Af Amer: >60 mL/min (ref >60)
GFR calc non Af Amer: >60 mL/min (ref >60)
Glucose, Bld: 109 mg/dL — ABNORMAL HIGH (ref 70–99)
Potassium: 3.4 mmol/L — ABNORMAL LOW (ref 3.5–5.1)
Sodium: 139 mmol/L (ref 135–145)
Total Bilirubin: 1.3 mg/dL — ABNORMAL HIGH (ref 0.3–1.2)
Total Protein: 6.8 g/dL (ref 6.5–8.1)

## 2018-07-06 LAB — CBC WITH DIFFERENTIAL/PLATELET
Abs Immature Granulocytes: 0.03 10*3/uL (ref 0.00–0.07)
Basophils Absolute: 0.1 10*3/uL (ref 0.0–0.1)
Basophils Relative: 1 %
Eosinophils Absolute: 0.1 10*3/uL (ref 0.0–0.5)
Eosinophils Relative: 2 %
HCT: 37.9 % — ABNORMAL LOW (ref 39.0–52.0)
Hemoglobin: 12.6 g/dL — ABNORMAL LOW (ref 13.0–17.0)
Immature Granulocytes: 0 %
Lymphocytes Relative: 10 %
Lymphs Abs: 0.7 10*3/uL (ref 0.7–4.0)
MCH: 30.4 pg (ref 26.0–34.0)
MCHC: 33.2 g/dL (ref 30.0–36.0)
MCV: 91.3 fL (ref 80.0–100.0)
Monocytes Absolute: 0.9 10*3/uL (ref 0.1–1.0)
Monocytes Relative: 13 %
Neutro Abs: 5.2 10*3/uL (ref 1.7–7.7)
Neutrophils Relative %: 74 %
Platelets: 272 10*3/uL (ref 150–400)
RBC: 4.15 MIL/uL — ABNORMAL LOW (ref 4.22–5.81)
RDW: 16.4 % — ABNORMAL HIGH (ref 11.5–15.5)
WBC: 7 10*3/uL (ref 4.0–10.5)
nRBC: 0 % (ref 0.0–0.2)

## 2018-07-06 LAB — LACTATE DEHYDROGENASE: LDH: 158 U/L (ref 98–192)

## 2018-07-06 LAB — MAGNESIUM: Magnesium: 1.8 mg/dL (ref 1.7–2.4)

## 2018-07-06 LAB — BILIRUBIN, DIRECT: Bilirubin, Direct: 0.2 mg/dL (ref 0.0–0.2)

## 2018-07-06 LAB — URIC ACID: Uric Acid, Serum: 5.8 mg/dL (ref 3.7–8.6)

## 2018-07-06 NOTE — Progress Notes (Signed)
[facility] day:  07/06/2018  Chief Complaint: Ronnie Mcintyre is a 52 y.o. male with a large B-cell lymphoma who is seen for review of restaging after 6 cycles of RCHOP chemotherapy.  HPI:  The patient was last seen in the medical oncology clinic on 06/10/2018.  At that time, he was doing well.  He denied any B symptoms.  Exam revealed no adenopathy or hepatosplenomegaly.  He received cycle #6 RCHOP chemotherapy with Neulasta support.  PET scan on 07/02/2018 revealed clear interval response to therapy with decrease in size and hypermetabolism of the anterior mediastinal mass (6.9 x 11.1 cm to 2 x 2.6 cm; SUV 14.3 to 5.3).  There was a similar appearance of the diffuse marrow hypermetabolism, nonspecific.  There was a 4.2 cm ascending thoracic aortic diameter.  There was a stable left adrenal adenoma.  During the interim, he has done well.  He notes that "my energy is not quite where I need it to be".  He denies any chest pain, shortness of breath or cough.  He denies any B symptoms.  Weight is stable.   Past Medical History:  Diagnosis Date  . Cancer ([place])   . Hypertension     Past Surgical History:  Procedure Laterality Date  . ANKLE SURGERY Right   . PORTACATH PLACEMENT Right 02/11/2018   Procedure: INSERTION PORT-A-CATH;  Surgeon: Jules Husbands, MD;  Location: [facility] ORS;  Service: General;  Laterality: Right;    Family History  Problem Relation Age of Onset  . Hypertension Mother   . Hypertension Father   . Multiple myeloma Father     Social History:  reports that he has never smoked. He has never used smokeless tobacco. He reports current alcohol use. He reports that he does not use drugs.  He drinks a beer 1-2 x/week.  He works for [employer].  He plans to return to work on 07/22/2018.  He lives in [place].  His is alone today.  Allergies: No Known Allergies  Current Medications: Current Outpatient Medications   Medication Sig Dispense Refill  . aspirin 81 MG tablet Take 81 mg by mouth daily.     . carvedilol (COREG) 3.125 MG tablet Take 1 tablet (3.125 mg total) by mouth 2 (two) times daily with a meal. 180 tablet 3  . losartan-hydrochlorothiazide (HYZAAR) 100-25 MG tablet Take 1 tablet by mouth daily.    . simvastatin (ZOCOR) 40 MG tablet Take 40 mg by mouth daily.    [name] acetaminophen (TYLENOL) 325 MG tablet Take 2 tablets (650 mg total) by mouth every 6 (six) hours as needed for mild pain (or Fever >/= 101).    . chlorpheniramine-HYDROcodone (TUSSIONEX) 10-8 MG/5ML SUER Take 5 mLs by mouth every 12 (twelve) hours as needed for cough. 115 mL 0  . guaiFENesin (MUCINEX) 600 MG 12 hr tablet Take 2 tablets (1,200 mg total) by mouth 2 (two) times daily as needed for cough or to loosen phlegm. 28 tablet 0  . potassium chloride SA (K-DUR,KLOR-CON) 20 MEQ tablet Take 1 tablet (20 mEq total) by mouth 2 (two) times daily. For low potassium 14 tablet 0   No current facility-administered medications for this visit.     Review of Systems  Constitutional: Negative.  Negative for chills, diaphoresis, fever, malaise/fatigue and weight loss (stable).       Energy level is "not where I need it to be".  HENT: Negative.  Negative for congestion, ear  discharge, ear pain, nosebleeds, sinus pain, sore throat and tinnitus.   Eyes: Negative.  Negative for blurred vision, double vision, photophobia, pain and discharge.  Respiratory: Negative.  Negative for cough, hemoptysis, sputum production, shortness of breath and wheezing.   Cardiovascular: Negative.  Negative for chest pain, palpitations, orthopnea, leg swelling and PND.  Gastrointestinal: Negative.  Negative for abdominal pain, blood in stool, constipation, diarrhea, melena, nausea and vomiting.  Genitourinary: Negative.  Negative for dysuria, frequency, hematuria and urgency.  Musculoskeletal: Negative.  Negative for back pain, falls, joint pain, myalgias and neck  pain.  Skin: Negative.  Negative for itching and rash.  Neurological: Negative for dizziness, tingling, tremors, sensory change, speech change, focal weakness, weakness and headaches.  Endo/Heme/Allergies: Negative.  Does not bruise/bleed easily.  Psychiatric/Behavioral: Negative.  Negative for depression and memory loss. The patient is not nervous/anxious and does not have insomnia.   All other systems reviewed and are negative.  Performance status (ECOG): 1  Vital Signs BP 105/73 (BP Location: Left Arm, Patient Position: Sitting)   Pulse (!) 108   Temp 97.6 F (36.4 C) (Tympanic)   Resp 18   Wt 257 lb 3 oz (116.7 kg)   BMI 34.88 kg/m   Physical Exam  Constitutional: He is oriented to person, place, and time and well-developed, well-nourished, and in no distress. No distress.  HENT:  Head: Normocephalic and atraumatic.  Mouth/Throat: Oropharynx is clear and moist and mucous membranes are normal. No oropharyngeal exudate.  Alopecia.  Eyes: Pupils are equal, round, and reactive to light. Conjunctivae and EOM are normal. No scleral icterus.  Blue.  Neck: Normal range of motion. Neck supple. No JVD present.  Cardiovascular: Regular rhythm, normal heart sounds and intact distal pulses. Tachycardia present. Exam reveals no gallop and no friction rub.  No murmur heard. Pulmonary/Chest: Effort normal and breath sounds normal. No respiratory distress. He has no wheezes. He has no rales.  Abdominal: Soft. Bowel sounds are normal. He exhibits no distension and no mass. There is no abdominal tenderness. There is no rebound and no guarding.  Musculoskeletal: Normal range of motion.        General: No tenderness or edema.  Lymphadenopathy:    He has no cervical adenopathy.    He has no axillary adenopathy.       Right: No supraclavicular adenopathy present.       Left: No supraclavicular adenopathy present.  Neurological: He is alert and oriented to person, place, and time. Gait normal.   Able to walk on heels (no foot drop).  Skin: Skin is warm and dry. No rash noted. He is not diaphoretic. No erythema. No pallor.  Psychiatric: Mood, affect and judgment normal.  Nursing note and vitals reviewed.   Appointment on 07/06/2018  Component Date Value Ref Range Status  . Uric Acid, Serum 07/06/2018 5.8  3.7 - 8.6 mg/dL Final   Performed at [facility], [address] [place], [place] [address]  . LDH 07/06/2018 158  98 - 192 U/L Final   Performed at [facility], [address] [place], [place] [address]  . Magnesium 07/06/2018 1.8  1.7 - 2.4 mg/dL Final   Performed at [facility], [address] [place], [place] [address]  . Sodium 07/06/2018 139  135 - 145 mmol/L Final  . Potassium 07/06/2018 3.4* 3.5 - 5.1 mmol/L Final  . Chloride 07/06/2018 105  98 - 111 mmol/L Final  . CO2 07/06/2018 26  22 - 32 mmol/L  Final  . Glucose, Bld 07/06/2018 109* 70 - 99 mg/dL Final  . BUN 07/06/2018 16  6 - 20 mg/dL Final  . Creatinine, Ser 07/06/2018 1.09  0.61 - 1.24 mg/dL Final  . Calcium 07/06/2018 9.4  8.9 - 10.3 mg/dL Final  . Total Protein 07/06/2018 6.8  6.5 - 8.1 g/dL Final  . Albumin 07/06/2018 4.0  3.5 - 5.0 g/dL Final  . AST 07/06/2018 28  15 - 41 U/L Final  . ALT 07/06/2018 34  0 - 44 U/L Final  . Alkaline Phosphatase 07/06/2018 57  38 - 126 U/L Final  . Total Bilirubin 07/06/2018 1.3* 0.3 - 1.2 mg/dL Final  . GFR calc non Af Amer 07/06/2018 >60  >60 mL/min Final  . GFR calc Af Amer 07/06/2018 >60  >60 mL/min Final  . Anion gap 07/06/2018 8  5 - 15 Final   Performed at [facility], [address] [place], [place] [address]  . WBC 07/06/2018 7.0  4.0 - 10.5 K/uL Final  . RBC 07/06/2018 4.15* 4.22 - 5.81 MIL/uL Final  . Hemoglobin 07/06/2018 12.6* 13.0 - 17.0 g/dL Final  . HCT 07/06/2018 37.9* 39.0 - 52.0 % Final  . MCV 07/06/2018 91.3  80.0 - 100.0 fL Final  . MCH 07/06/2018 30.4  26.0 - 34.0 pg Final  . MCHC 07/06/2018 33.2  30.0 -  36.0 g/dL Final  . RDW 07/06/2018 16.4* 11.5 - 15.5 % Final  . Platelets 07/06/2018 272  150 - 400 K/uL Final  . nRBC 07/06/2018 0.0  0.0 - 0.2 % Final  . Neutrophils Relative % 07/06/2018 74  % Final  . Neutro Abs 07/06/2018 5.2  1.7 - 7.7 K/uL Final  . Lymphocytes Relative 07/06/2018 10  % Final  . Lymphs Abs 07/06/2018 0.7  0.7 - 4.0 K/uL Final  . Monocytes Relative 07/06/2018 13  % Final  . Monocytes Absolute 07/06/2018 0.9  0.1 - 1.0 K/uL Final  . Eosinophils Relative 07/06/2018 2  % Final  . Eosinophils Absolute 07/06/2018 0.1  0.0 - 0.5 K/uL Final  . Basophils Relative 07/06/2018 1  % Final  . Basophils Absolute 07/06/2018 0.1  0.0 - 0.1 K/uL Final  . Immature Granulocytes 07/06/2018 0  % Final  . Abs Immature Granulocytes 07/06/2018 0.03  0.00 - 0.07 K/uL Final   Performed at [facility], [address] [place], [place] [address]  . Bilirubin, Direct 07/06/2018 0.2  0.0 - 0.2 mg/dL Final   Performed at [facility], [address] [place], [place] [address]    Assessment:  Ronnie Mcintyre is a 52 y.o. 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  [facility] pathology revealed necrotic tissue. [facility] consultation revealed neoplastic cells are positive for CD20, BCL-6, MUM-1, and BCL-2. The neoplastic cells were negative for CD30, CD3, , CD10, c-MYC, EBV ISH, AE1/3, and PAX-8. Ki67 is 60-70%. 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 The absence of CD30 expression makes primary mediastinal large B-cell lymphoma less likely. FISH for MYC rearrangement was negative. IPI score, age adjusted IPI, are low (1) and NCCN IPI score is low-intermediate (2).  He presented with upper chest fullness with radiation to his neck and arm. He has had B symptoms (sweats and weight loss) worrisome for lymphoma. LDHis 273. Uric acid was normal.  Beta-HCG and AFP were normal on  01/16/2018.  Chest CT angiogramon 01/15/2018 revealed  an 11.3 x 8.0 cm anterior mediastinal mass.  Abdomen and pelvic CT on 01/16/2018 revealed a 6.3 x 5.6 cm soft tissue mass or medium density fluid filling the urinary bladder.  There was a 1.5 cm mildly complex lower pole right renal cyst. There was a 1.9 cm indeterminate left adrenal mass.  There was a small pericardial effusion.  Renal ultrasound on 01/25/2018 revealed no evidence of bladder mass.  Finding on prior CT exam likely represented excretion of residual contrast material from a CTA chest exam performed 1 day prior   PET scan on 01/26/2018 revealed an 11.1 cm intensely hypermetabolic and partially necrotic right anterior mediastinal mass, compatible with malignancy. There was diffuse marrow hypermetabolism, nonspecific, cannot exclude an infiltrative neoplastic marrow process. There was no focal skeletal hypermetabolism. There was no focal bone lesions on the CT images.  There was nonspecific mild heterogeneous prostatic hypermetabolism.  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    Bone marrow biopsy on 02/03/2018 revealed a slightly hypercellular bone marrow for age with trilineage hematopoiesis.  There was no monoclonal B-cell population or abnormal T-cell phenotype identified.  Cytogenetics were normal (46, XY).  Echo on 01/15/2018 revealed an EF of 55-60%.  Echo on 04/22/2018 revealed an EF of 50 to 55%. There was evidence of probable severe hypokinesis of the mid apical-anteroseptal, anterior, and apical myocardium.  Treadmill Myoview on 04/26/2018 revealed a low risk study with a small defect present in the apex location that was partially reversible and could represent small previous myocardial infarction. However, and apical thinning artifact could not be excluded.  Nuclear stress EF: 50%.  Echo on 05/17/2018 revealed an EF of 50-55%.  Hepatitis B, hepatitis C, and  HIV testing was negative on 02/10/2018.  G6PD assay was normal.  He received 6 cycles of RCHOP chemotherapy (02/16/2018 - 06/10/2018) with Neulasta support.  Chest, abdomen, and pelvic CT on 04/19/2018 revealed the anterior mediastinal mass had significantly reduced in size (10.1 x 6.9 cm to 6.8 by 4.5 cm).  The smaller left eccentric mediastinal lymph node measured 1.4 x 4.7 cm, previously 2.0 x 3.2 cm.  There was no appreciable adenopathy in the abdomen/pelvis.  There were bilateral hypodense renal lesions technically too small to characterize, including the right kidney lower pole lesion.   PET scan on 07/02/2018 revealed clear interval response to therapy with decrease in size and hypermetabolism of the anterior mediastinal mass (6.9 x 11.1 cm to 2 x 2.6 cm; SUV 14.3 to 5.3).  There was a similar appearance of the diffuse marrow hypermetabolism, nonspecific.  There was a 4.2 cm ascending thoracic aortic diameter.  There was a stable left adrenal adenoma.  He was diagnosed with T6-T7 herpes zoster on 03/19/2018.  He was treated with valacyclovir.  He is on prophylactic valacyclovir.    Symptomatically, he denies any B symptoms.  Exam reveals no adenopathy or hepatosplenomegaly.  Plan: 1. Labs today:  CBC with diff, CMP, LDH, uric acid. 2. DLBCL Clinically doing well. Review interval PET scan.  Images personally reviewed.  Agree with radiology interpretation. Present at tumor board on 07/08/2018. Discuss plans for radiation. Discuss plans for follow-up after completion of therapy. 3. Chemotherapy-induced neuropathy Patient has minimal neuropathy in fingertips. Continue to monitor without intervention. 4. Cardiac wall motion abnormalities Echocardiogram on 05/17/2018 revealed an EF of 50-55%. Mild hypokinesis of the mid-apicalanteroseptal myocardium.  Continue follow-up with cardiology Fletcher Anon, MD).  Continue on carvedilol 3.125 BID.  5. Hypokalemia Potassium 3.4. Continue potassium 10  meq/day. 6. Tumor lysis monitoring Labs reviewed.  No evidence of TLS. Continue adequate hydration. Discontinue prophylactic allopurinol. 7. Elevated bilirubin  Mild. Bilirubin 1.3.   Check direct bilirubin.  Prior history of bilirubin 1.5 (direct 0.1) on 03/30/2018  Suspect Gilbert's disease. 8. Consult radiation oncology. 9.   Return to work note after first of the year. 10.   RTC after radiation.  Patient to call.    Lequita Asal, MD  07/06/2018, 4:35 PM

## 2018-07-06 NOTE — Progress Notes (Signed)
Patient offers no complaints today.  Patient here today for PET results. 

## 2018-07-08 ENCOUNTER — Encounter: Payer: Self-pay | Admitting: Urgent Care

## 2018-07-08 ENCOUNTER — Other Ambulatory Visit: Payer: Commercial Managed Care - PPO

## 2018-07-08 ENCOUNTER — Telehealth: Payer: Self-pay | Admitting: *Deleted

## 2018-07-08 NOTE — Telephone Encounter (Signed)
Patient called and states he wants to return to work on 07/22/18 and needs a note to take to his employer. He states he discussed this with B Pearline Cables, NP. He asks if the letter is ready for pickup. Please call when ready [phone]

## 2018-07-08 NOTE — Telephone Encounter (Signed)
I will draft a general letter. Ronnie Mcintyre is completing his STD information now. Should be no issues with him returning to work on 07/22/2018 as planned.   Honor Loh, MSN, APRN, FNP-C, CEN Oncology/Hematology Nurse Practitioner  [facility] 07/08/18, 11:50 AM

## 2018-07-08 NOTE — Progress Notes (Signed)
Tumor Board Documentation  Ronnie Mcintyre was presented by Dr Mike Gip at our Tumor Board on 07/08/2018, which included representatives from medical oncology, radiation oncology, surgical, radiology, pathology, navigation, internal medicine, research, nutrition, genetics, pulmonology.  Ronnie Mcintyre currently presents as a current patient with history of the following treatments: neoadjuvant chemotherapy.  Additionally, we reviewed previous medical and familial history, history of present illness, and recent lab results along with all available histopathologic and imaging studies. The tumor board considered available treatment options and made the following recommendations:   Radiation Therapy post checmotherapy  The following procedures/referrals were also placed: No orders of the defined types were placed in this encounter.   Clinical Trial Status: not discussed   Staging used: AJCC Stage Group B Cell Lymphoma  National site-specific guidelines NCCN were discussed with respect to the case.  Tumor board is a meeting of clinicians from various specialty areas who evaluate and discuss patients for whom a multidisciplinary approach is being considered. Final determinations in the plan of care are those of the provider(s). The responsibility for follow up of recommendations given during tumor board is that of the provider.   Today's extended care, comprehensive team conference, Ronnie Mcintyre was not present for the discussion and was not examined.   Multidisciplinary Tumor Board is a multidisciplinary case peer review process.  Decisions discussed in the Multidisciplinary Tumor Board reflect the opinions of the specialists present at the conference without having examined the patient.  Ultimately, treatment and diagnostic decisions rest with the primary provider(s) and the patient.

## 2018-07-15 ENCOUNTER — Encounter: Payer: Self-pay | Admitting: Radiation Oncology

## 2018-07-15 ENCOUNTER — Ambulatory Visit
Admission: RE | Admit: 2018-07-15 | Discharge: 2018-07-15 | Disposition: A | Discharge status: Home / Self Care | Payer: Commercial Managed Care - PPO | Source: Ambulatory Visit | Attending: Radiation Oncology | Admitting: Radiation Oncology

## 2018-07-15 ENCOUNTER — Other Ambulatory Visit: Payer: Self-pay

## 2018-07-15 VITALS — BP 135/89 | HR 106 | Temp 95.6°F | Resp 16 | Wt 261.0 lb

## 2018-07-15 DIAGNOSIS — C8332 Diffuse large B-cell lymphoma, intrathoracic lymph nodes: Secondary | ICD-10-CM | POA: Diagnosis present

## 2018-07-15 DIAGNOSIS — Z7982 Long term (current) use of aspirin: Secondary | ICD-10-CM | POA: Diagnosis not present

## 2018-07-15 DIAGNOSIS — I10 Essential (primary) hypertension: Secondary | ICD-10-CM | POA: Diagnosis not present

## 2018-07-15 DIAGNOSIS — C8510 Unspecified B-cell lymphoma, unspecified site: Secondary | ICD-10-CM

## 2018-07-15 DIAGNOSIS — Z79899 Other long term (current) drug therapy: Secondary | ICD-10-CM | POA: Diagnosis not present

## 2018-07-15 NOTE — Consult Note (Signed)
NEW PATIENT EVALUATION  Name: Ronnie Mcintyre  MRN: [id number]  Date:   07/15/2018     DOB: Dec 10, 1965   This 52 y.o. 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  REFERRING PHYSICIAN: [name], MD  CHIEF COMPLAINT:  Chief Complaint  Patient presents with  . Cancer    Initial consultation of lymphoma    DIAGNOSIS: The encounter diagnosis was Large B-cell lymphoma ([place]).   PREVIOUS INVESTIGATIONS:  PET/CT scans and CT scans reviewed Pathology reports reviewed Clinical notes reviewed  HPI: patient is a 52 year old male who presented with increasing dysphagia weight loss and night sweats.he was found to have a large mediastinal mass on PET CT scan measuring approximately 11.3 cm in greatest dimension. He underwent biopsy which was positive for CD20 positive,BCL-6, MUM-1, and BCL-2.[name] Patient's bone marrow was slightly hypercellular although no involvement was noted.PET CT scan showing hypermetabolic and partially necrotic 11.1 cm mass in the anterior mediastinum. Patient underwent 6 cycles of R CHOP. Repeat PET CT scan demonstrated interval response to therapy with decreased size of the mass as well as decrease hypermetabolic activity. He tolerated his chemotherapy fairly well. 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 His case was presented at our weekly tumor conference based on the bulky size of his mediastinal mass adjuvant radiation therapy was recommended.  PLANNED TREATMENT REGIMEN: involved field radiation therapy  PAST MEDICAL HISTORY:  has a past medical history of Cancer ([place]) and Hypertension.    PAST SURGICAL HISTORY:  Past Surgical History:  Procedure Laterality Date  . ANKLE SURGERY Right   . PORTACATH PLACEMENT Right 02/11/2018   Procedure: INSERTION PORT-A-CATH;  Surgeon: Jules Husbands, MD;  Location: [facility] ORS;  Service: General;  Laterality:  Right;    FAMILY HISTORY: family history includes Hypertension in his father and mother; Multiple myeloma in his father.  SOCIAL HISTORY:  reports that he has never smoked. He has never used smokeless tobacco. He reports current alcohol use. He reports that he does not use drugs.  ALLERGIES: Patient has no known allergies.  MEDICATIONS:  Current Outpatient Medications  Medication Sig Dispense Refill  . allopurinol (ZYLOPRIM) 300 MG tablet Take 1 tablet (300 mg total) by mouth daily. 90 tablet 1  . aspirin 81 MG tablet Take 81 mg by mouth daily.     . carvedilol (COREG) 3.125 MG tablet Take 1 tablet (3.125 mg total) by mouth 2 (two) times daily with a meal. 180 tablet 3  . hydrocortisone 1 % lotion Apply 1 application topically 2 (two) times daily as needed for itching.    . loratadine (CLARITIN) 10 MG tablet Take 10 mg by mouth daily as needed for allergies.     [name] losartan-hydrochlorothiazide (HYZAAR) 100-25 MG tablet Take 1 tablet by mouth daily.    . potassium chloride SA (K-DUR,KLOR-CON) 10 MEQ tablet Take 1 tablet (10 mEq) daily. 30 tablet 3  . simvastatin (ZOCOR) 40 MG tablet Take 40 mg by mouth daily.    . valACYclovir (VALTREX) 500 MG tablet TAKE 1 TABLET BY MOUTH TWICE A DAY 60 tablet 0  . acetaminophen (TYLENOL) 325 MG tablet Take 2 tablets (650 mg total) by mouth every 6 (six) hours as needed for mild pain (or Fever >/= 101). (Patient not taking: Reported on 07/06/2018)    . fluticasone (FLONASE) 50 MCG/ACT nasal spray Place 2 sprays into both nostrils daily. (Patient  not taking: Reported on 07/06/2018) 16 g 0  . lidocaine-prilocaine (EMLA) cream Apply to affected area once (Patient not taking: Reported on 07/06/2018) 30 g 3  . LORazepam (ATIVAN) 0.5 MG tablet Take 1 tablet (0.5 mg total) by mouth every 6 (six) hours as needed (Nausea or vomiting). (Patient not taking: Reported on 07/06/2018) 30 tablet 0  . ondansetron (ZOFRAN) 8 MG tablet Take 1 tablet (8 mg total) by mouth 2  (two) times daily as needed. Start on day 3 after cyclophosphamide chemotherapy. (Patient not taking: Reported on 07/06/2018) 30 tablet 1  . oxyCODONE (OXY IR/ROXICODONE) 5 MG immediate release tablet Take 1 tablet (5 mg total) by mouth every 6 (six) hours as needed for moderate pain. (Patient not taking: Reported on 07/06/2018) 30 tablet 0  . pantoprazole (PROTONIX) 40 MG tablet TAKE 1 TABLET (40 MG TOTAL) BY MOUTH DAILY. USE DAILY WHILE ON PREDNISONE FOR GI PROPHYLAXIS. (Patient not taking: Reported on 07/06/2018) 90 tablet 1  . predniSONE (DELTASONE) 50 MG tablet TAKE 2 TABLETS (100 MG TOTAL) BY MOUTH DAILY. TAKE ON DAYS 1-5 OF CHEMOTHERAPY. (Patient not taking: Reported on 07/06/2018) 60 tablet 1   No current facility-administered medications for this encounter.     ECOG PERFORMANCE STATUS:  0 - Asymptomatic  REVIEW OF SYSTEMS:  Patient denies any weight loss, fatigue, weakness, fever, chills or night sweats. Patient denies any loss of vision, blurred vision. Patient denies any ringing  of the ears or hearing loss. No irregular heartbeat. Patient denies heart murmur or history of fainting. Patient denies any chest pain or pain radiating to her upper extremities. Patient denies any shortness of breath, difficulty breathing at night, cough or hemoptysis. Patient denies any swelling in the lower legs. Patient denies any nausea vomiting, vomiting of blood, or coffee ground material in the vomitus. Patient denies any stomach pain. Patient states has had normal bowel movements no significant constipation or diarrhea. Patient denies any dysuria, hematuria or significant nocturia. Patient denies any problems walking, swelling in the joints or loss of balance. Patient denies any skin changes, loss of hair or loss of weight. Patient denies any excessive worrying or anxiety or significant depression. Patient denies any problems with insomnia. Patient denies excessive thirst, polyuria, polydipsia. Patient denies  any swollen glands, patient denies easy bruising or easy bleeding. Patient denies any recent infections, allergies or URI. Patient "s visual fields have not changed significantly in recent time.    PHYSICAL EXAM: BP 135/89 (BP Location: Left Arm, Patient Position: Sitting)   Pulse (!) 106   Temp (!) 95.6 F (35.3 C) (Tympanic)   Resp 16   Wt 261 lb 0.4 oz (118.4 kg)   BMI 35.40 kg/m  Well-developed well-nourished patient in NAD. HEENT reveals PERLA, EOMI, discs not visualized.  Oral cavity is clear. No oral mucosal lesions are identified. Neck is clear without evidence of cervical or supraclavicular adenopathy. Lungs are clear to A&P. Cardiac examination is essentially unremarkable with regular rate and rhythm without murmur rub or thrill. Abdomen is benign with no organomegaly or masses noted. Motor sensory and DTR levels are equal and symmetric in the upper and lower extremities. Cranial nerves II through XII are grossly intact. Proprioception is intact. No peripheral adenopathy or edema is identified. No motor or sensory levels are noted. Crude visual fields are within normal range.  LABORATORY DATA: pathology reports reviewed    RADIOLOGY RESULTS:CT scans and serial PET/CT scans reviewedstage   IMPRESSION: IB bulky diffuse B-cell lymphoma the anterior  mediastinum in 52 year old male status post 6 cycles of R CHOP with good response by PET CT criteria   PLAN: this time I to go ahead with involved field radiation therapy. I would plan on delivering 3600 cGy in 18 fractions. I will use PET CT fusion study for treatment planning purposes. Risks and benefits of treatment including possible dysphasia from radiation esophagitis fatigue alteration of blood counts skin reaction all were described in detail to the patient. He seems to comprehend my treatment plan well. I have personally separate ordered CT simulation for later this week.  I would like to take this opportunity to thank you for  allowing me to participate in the care of your patient.Noreene Filbert, MD

## 2018-07-16 ENCOUNTER — Ambulatory Visit
Admission: RE | Admit: 2018-07-16 | Discharge: 2018-07-16 | Disposition: A | Discharge status: Home / Self Care | Payer: Commercial Managed Care - PPO | Source: Ambulatory Visit | Attending: Radiation Oncology | Admitting: Radiation Oncology

## 2018-07-16 DIAGNOSIS — C8510 Unspecified B-cell lymphoma, unspecified site: Secondary | ICD-10-CM | POA: Insufficient documentation

## 2018-07-19 ENCOUNTER — Other Ambulatory Visit: Payer: Self-pay | Admitting: Hematology and Oncology

## 2018-07-19 DIAGNOSIS — C8510 Unspecified B-cell lymphoma, unspecified site: Secondary | ICD-10-CM | POA: Diagnosis not present

## 2018-07-19 NOTE — Telephone Encounter (Signed)
Does he still need to be on this? He has completed Tx.  BG

## 2018-07-22 ENCOUNTER — Other Ambulatory Visit: Payer: Self-pay | Admitting: *Deleted

## 2018-07-22 DIAGNOSIS — C8510 Unspecified B-cell lymphoma, unspecified site: Secondary | ICD-10-CM

## 2018-07-26 ENCOUNTER — Ambulatory Visit
Admission: RE | Admit: 2018-07-26 | Discharge: 2018-07-26 | Disposition: A | Discharge status: Home / Self Care | Payer: Commercial Managed Care - PPO | Source: Ambulatory Visit | Attending: Radiation Oncology | Admitting: Radiation Oncology

## 2018-07-26 ENCOUNTER — Other Ambulatory Visit: Payer: Self-pay | Admitting: Urgent Care

## 2018-07-26 DIAGNOSIS — R000 Tachycardia, unspecified: Secondary | ICD-10-CM | POA: Diagnosis not present

## 2018-07-26 DIAGNOSIS — E876 Hypokalemia: Secondary | ICD-10-CM | POA: Diagnosis not present

## 2018-07-26 DIAGNOSIS — C8332 Diffuse large B-cell lymphoma, intrathoracic lymph nodes: Secondary | ICD-10-CM | POA: Diagnosis not present

## 2018-07-26 DIAGNOSIS — R0781 Pleurodynia: Secondary | ICD-10-CM | POA: Insufficient documentation

## 2018-07-26 DIAGNOSIS — R05 Cough: Secondary | ICD-10-CM | POA: Diagnosis not present

## 2018-07-26 DIAGNOSIS — C8510 Unspecified B-cell lymphoma, unspecified site: Secondary | ICD-10-CM | POA: Insufficient documentation

## 2018-07-27 ENCOUNTER — Ambulatory Visit
Admission: RE | Admit: 2018-07-27 | Discharge: 2018-07-27 | Disposition: A | Discharge status: Home / Self Care | Payer: Commercial Managed Care - PPO | Source: Ambulatory Visit | Attending: Radiation Oncology | Admitting: Radiation Oncology

## 2018-07-27 DIAGNOSIS — R0781 Pleurodynia: Secondary | ICD-10-CM | POA: Diagnosis not present

## 2018-07-27 DIAGNOSIS — C8332 Diffuse large B-cell lymphoma, intrathoracic lymph nodes: Secondary | ICD-10-CM | POA: Diagnosis not present

## 2018-07-27 DIAGNOSIS — R05 Cough: Secondary | ICD-10-CM | POA: Diagnosis not present

## 2018-07-28 ENCOUNTER — Ambulatory Visit
Admission: RE | Admit: 2018-07-28 | Discharge: 2018-07-28 | Disposition: A | Discharge status: Home / Self Care | Payer: Commercial Managed Care - PPO | Source: Ambulatory Visit | Attending: Radiation Oncology | Admitting: Radiation Oncology

## 2018-07-28 DIAGNOSIS — R0781 Pleurodynia: Secondary | ICD-10-CM | POA: Diagnosis not present

## 2018-07-28 DIAGNOSIS — R05 Cough: Secondary | ICD-10-CM | POA: Diagnosis not present

## 2018-07-28 DIAGNOSIS — C8332 Diffuse large B-cell lymphoma, intrathoracic lymph nodes: Secondary | ICD-10-CM | POA: Diagnosis not present

## 2018-07-29 ENCOUNTER — Ambulatory Visit
Admission: RE | Admit: 2018-07-29 | Discharge: 2018-07-29 | Disposition: A | Discharge status: Home / Self Care | Payer: Commercial Managed Care - PPO | Source: Ambulatory Visit | Attending: Radiation Oncology | Admitting: Radiation Oncology

## 2018-07-29 DIAGNOSIS — C8332 Diffuse large B-cell lymphoma, intrathoracic lymph nodes: Secondary | ICD-10-CM | POA: Diagnosis not present

## 2018-07-29 DIAGNOSIS — R05 Cough: Secondary | ICD-10-CM | POA: Diagnosis not present

## 2018-07-29 DIAGNOSIS — R0781 Pleurodynia: Secondary | ICD-10-CM | POA: Diagnosis not present

## 2018-07-30 ENCOUNTER — Ambulatory Visit
Admission: RE | Admit: 2018-07-30 | Discharge: 2018-07-30 | Disposition: A | Discharge status: Home / Self Care | Payer: Commercial Managed Care - PPO | Source: Ambulatory Visit | Attending: Radiation Oncology | Admitting: Radiation Oncology

## 2018-07-30 DIAGNOSIS — C8332 Diffuse large B-cell lymphoma, intrathoracic lymph nodes: Secondary | ICD-10-CM | POA: Diagnosis not present

## 2018-07-30 DIAGNOSIS — R0781 Pleurodynia: Secondary | ICD-10-CM | POA: Diagnosis not present

## 2018-07-30 DIAGNOSIS — R05 Cough: Secondary | ICD-10-CM | POA: Diagnosis not present

## 2018-08-02 ENCOUNTER — Ambulatory Visit
Admission: RE | Admit: 2018-08-02 | Discharge: 2018-08-02 | Disposition: A | Discharge status: Home / Self Care | Payer: Commercial Managed Care - PPO | Source: Ambulatory Visit | Attending: Radiation Oncology | Admitting: Radiation Oncology

## 2018-08-02 DIAGNOSIS — R0781 Pleurodynia: Secondary | ICD-10-CM | POA: Diagnosis not present

## 2018-08-02 DIAGNOSIS — R05 Cough: Secondary | ICD-10-CM | POA: Diagnosis not present

## 2018-08-02 DIAGNOSIS — C8332 Diffuse large B-cell lymphoma, intrathoracic lymph nodes: Secondary | ICD-10-CM | POA: Diagnosis not present

## 2018-08-03 ENCOUNTER — Inpatient Hospital Stay: Payer: Commercial Managed Care - PPO | Attending: Hematology and Oncology

## 2018-08-03 ENCOUNTER — Ambulatory Visit
Admission: RE | Admit: 2018-08-03 | Discharge: 2018-08-03 | Disposition: A | Discharge status: Home / Self Care | Payer: Commercial Managed Care - PPO | Source: Ambulatory Visit | Attending: Radiation Oncology | Admitting: Radiation Oncology

## 2018-08-03 DIAGNOSIS — R0781 Pleurodynia: Secondary | ICD-10-CM | POA: Diagnosis not present

## 2018-08-03 DIAGNOSIS — C8510 Unspecified B-cell lymphoma, unspecified site: Secondary | ICD-10-CM

## 2018-08-03 DIAGNOSIS — Z95828 Presence of other vascular implants and grafts: Secondary | ICD-10-CM

## 2018-08-03 DIAGNOSIS — R05 Cough: Secondary | ICD-10-CM | POA: Diagnosis not present

## 2018-08-03 DIAGNOSIS — C8332 Diffuse large B-cell lymphoma, intrathoracic lymph nodes: Secondary | ICD-10-CM | POA: Diagnosis not present

## 2018-08-03 LAB — CBC
HCT: 40 % (ref 39.0–52.0)
Hemoglobin: 13.3 g/dL (ref 13.0–17.0)
MCH: 29 pg (ref 26.0–34.0)
MCHC: 33.3 g/dL (ref 30.0–36.0)
MCV: 87.1 fL (ref 80.0–100.0)
Platelets: 222 10*3/uL (ref 150–400)
RBC: 4.59 MIL/uL (ref 4.22–5.81)
RDW: 13.3 % (ref 11.5–15.5)
WBC: 8.1 10*3/uL (ref 4.0–10.5)
nRBC: 0 % (ref 0.0–0.2)

## 2018-08-03 MED ORDER — HEPARIN SOD (PORK) LOCK FLUSH 100 UNIT/ML IV SOLN
500.0000 [IU] | Freq: Once | INTRAVENOUS | Status: AC
  Administered 2018-08-03: 500 [IU] via INTRAVENOUS
  Filled 2018-08-03: qty 5

## 2018-08-03 MED ORDER — HEPARIN SOD (PORK) LOCK FLUSH 100 UNIT/ML IV SOLN: 500.0000 [IU] | Freq: Once | INTRAVENOUS | Status: AC

## 2018-08-03 MED ORDER — SODIUM CHLORIDE 0.9% FLUSH
10.0000 mL | INTRAVENOUS | Status: DC | PRN
  Filled 2018-08-03: qty 10

## 2018-08-03 MED ORDER — SODIUM CHLORIDE 0.9% FLUSH
10.0000 mL | Freq: Once | INTRAVENOUS | Status: AC
  Administered 2018-08-03: 10 mL via INTRAVENOUS
  Filled 2018-08-03: qty 10

## 2018-08-04 ENCOUNTER — Ambulatory Visit
Admission: RE | Admit: 2018-08-04 | Discharge: 2018-08-04 | Disposition: A | Discharge status: Home / Self Care | Payer: Commercial Managed Care - PPO | Source: Ambulatory Visit | Attending: Radiation Oncology | Admitting: Radiation Oncology

## 2018-08-04 DIAGNOSIS — R05 Cough: Secondary | ICD-10-CM | POA: Diagnosis not present

## 2018-08-04 DIAGNOSIS — C8332 Diffuse large B-cell lymphoma, intrathoracic lymph nodes: Secondary | ICD-10-CM | POA: Diagnosis not present

## 2018-08-04 DIAGNOSIS — R0781 Pleurodynia: Secondary | ICD-10-CM | POA: Diagnosis not present

## 2018-08-05 ENCOUNTER — Ambulatory Visit
Admission: RE | Admit: 2018-08-05 | Discharge: 2018-08-05 | Disposition: A | Discharge status: Home / Self Care | Payer: Commercial Managed Care - PPO | Source: Ambulatory Visit | Attending: Radiation Oncology | Admitting: Radiation Oncology

## 2018-08-05 ENCOUNTER — Other Ambulatory Visit: Payer: Self-pay | Admitting: Urgent Care

## 2018-08-05 DIAGNOSIS — R05 Cough: Secondary | ICD-10-CM | POA: Diagnosis not present

## 2018-08-05 DIAGNOSIS — R0781 Pleurodynia: Secondary | ICD-10-CM | POA: Diagnosis not present

## 2018-08-05 DIAGNOSIS — C8332 Diffuse large B-cell lymphoma, intrathoracic lymph nodes: Secondary | ICD-10-CM | POA: Diagnosis not present

## 2018-08-05 NOTE — Telephone Encounter (Signed)
Does he still need to be on this? Finished with Tx. This was for TLS prophylaxis.

## 2018-08-06 ENCOUNTER — Ambulatory Visit
Admission: RE | Admit: 2018-08-06 | Discharge: 2018-08-06 | Disposition: A | Discharge status: Home / Self Care | Payer: Commercial Managed Care - PPO | Source: Ambulatory Visit | Attending: Radiation Oncology | Admitting: Radiation Oncology

## 2018-08-06 DIAGNOSIS — R0781 Pleurodynia: Secondary | ICD-10-CM | POA: Diagnosis not present

## 2018-08-06 DIAGNOSIS — C8332 Diffuse large B-cell lymphoma, intrathoracic lymph nodes: Secondary | ICD-10-CM | POA: Diagnosis not present

## 2018-08-06 DIAGNOSIS — R05 Cough: Secondary | ICD-10-CM | POA: Diagnosis not present

## 2018-08-09 ENCOUNTER — Ambulatory Visit
Admission: RE | Admit: 2018-08-09 | Discharge: 2018-08-09 | Disposition: A | Discharge status: Home / Self Care | Payer: Commercial Managed Care - PPO | Source: Ambulatory Visit | Attending: Radiation Oncology | Admitting: Radiation Oncology

## 2018-08-09 DIAGNOSIS — R05 Cough: Secondary | ICD-10-CM | POA: Diagnosis not present

## 2018-08-09 DIAGNOSIS — C8332 Diffuse large B-cell lymphoma, intrathoracic lymph nodes: Secondary | ICD-10-CM | POA: Diagnosis not present

## 2018-08-09 DIAGNOSIS — R0781 Pleurodynia: Secondary | ICD-10-CM | POA: Diagnosis not present

## 2018-08-10 ENCOUNTER — Inpatient Hospital Stay: Payer: Commercial Managed Care - PPO

## 2018-08-10 ENCOUNTER — Ambulatory Visit
Admission: RE | Admit: 2018-08-10 | Discharge: 2018-08-10 | Disposition: A | Discharge status: Home / Self Care | Payer: Commercial Managed Care - PPO | Source: Ambulatory Visit | Attending: Radiation Oncology | Admitting: Radiation Oncology

## 2018-08-10 DIAGNOSIS — R05 Cough: Secondary | ICD-10-CM | POA: Diagnosis not present

## 2018-08-10 DIAGNOSIS — C8332 Diffuse large B-cell lymphoma, intrathoracic lymph nodes: Secondary | ICD-10-CM | POA: Diagnosis not present

## 2018-08-10 DIAGNOSIS — R0781 Pleurodynia: Secondary | ICD-10-CM | POA: Diagnosis not present

## 2018-08-10 DIAGNOSIS — C8510 Unspecified B-cell lymphoma, unspecified site: Secondary | ICD-10-CM

## 2018-08-10 LAB — CBC
HCT: 41.7 % (ref 39.0–52.0)
Hemoglobin: 13.9 g/dL (ref 13.0–17.0)
MCH: 28.5 pg (ref 26.0–34.0)
MCHC: 33.3 g/dL (ref 30.0–36.0)
MCV: 85.6 fL (ref 80.0–100.0)
NRBC: 0 % (ref 0.0–0.2)
Platelets: 207 10*3/uL (ref 150–400)
RBC: 4.87 MIL/uL (ref 4.22–5.81)
RDW: 13.2 % (ref 11.5–15.5)
WBC: 6.7 10*3/uL (ref 4.0–10.5)

## 2018-08-11 ENCOUNTER — Ambulatory Visit
Admission: RE | Admit: 2018-08-11 | Discharge: 2018-08-11 | Disposition: A | Discharge status: Home / Self Care | Payer: Commercial Managed Care - PPO | Source: Ambulatory Visit | Attending: Radiation Oncology | Admitting: Radiation Oncology

## 2018-08-11 DIAGNOSIS — C8332 Diffuse large B-cell lymphoma, intrathoracic lymph nodes: Secondary | ICD-10-CM | POA: Diagnosis not present

## 2018-08-11 DIAGNOSIS — R0781 Pleurodynia: Secondary | ICD-10-CM | POA: Diagnosis not present

## 2018-08-11 DIAGNOSIS — R05 Cough: Secondary | ICD-10-CM | POA: Diagnosis not present

## 2018-08-12 ENCOUNTER — Ambulatory Visit
Admission: RE | Admit: 2018-08-12 | Discharge: 2018-08-12 | Disposition: A | Discharge status: Home / Self Care | Payer: Commercial Managed Care - PPO | Source: Ambulatory Visit | Attending: Radiation Oncology | Admitting: Radiation Oncology

## 2018-08-12 DIAGNOSIS — R0781 Pleurodynia: Secondary | ICD-10-CM | POA: Diagnosis not present

## 2018-08-12 DIAGNOSIS — R05 Cough: Secondary | ICD-10-CM | POA: Diagnosis not present

## 2018-08-12 DIAGNOSIS — C8332 Diffuse large B-cell lymphoma, intrathoracic lymph nodes: Secondary | ICD-10-CM | POA: Diagnosis not present

## 2018-08-13 ENCOUNTER — Ambulatory Visit
Admission: RE | Admit: 2018-08-13 | Discharge: 2018-08-13 | Disposition: A | Discharge status: Home / Self Care | Payer: Commercial Managed Care - PPO | Source: Ambulatory Visit | Attending: Radiation Oncology | Admitting: Radiation Oncology

## 2018-08-13 DIAGNOSIS — R0781 Pleurodynia: Secondary | ICD-10-CM | POA: Diagnosis not present

## 2018-08-13 DIAGNOSIS — R05 Cough: Secondary | ICD-10-CM | POA: Diagnosis not present

## 2018-08-13 DIAGNOSIS — C8332 Diffuse large B-cell lymphoma, intrathoracic lymph nodes: Secondary | ICD-10-CM | POA: Diagnosis not present

## 2018-08-16 ENCOUNTER — Ambulatory Visit
Admission: RE | Admit: 2018-08-16 | Discharge: 2018-08-16 | Disposition: A | Discharge status: Home / Self Care | Payer: Commercial Managed Care - PPO | Source: Ambulatory Visit | Attending: Radiation Oncology | Admitting: Radiation Oncology

## 2018-08-16 DIAGNOSIS — R0781 Pleurodynia: Secondary | ICD-10-CM | POA: Diagnosis not present

## 2018-08-16 DIAGNOSIS — C8332 Diffuse large B-cell lymphoma, intrathoracic lymph nodes: Secondary | ICD-10-CM | POA: Diagnosis not present

## 2018-08-16 DIAGNOSIS — R05 Cough: Secondary | ICD-10-CM | POA: Diagnosis not present

## 2018-08-17 ENCOUNTER — Inpatient Hospital Stay: Payer: Commercial Managed Care - PPO

## 2018-08-17 ENCOUNTER — Ambulatory Visit
Admission: RE | Admit: 2018-08-17 | Discharge: 2018-08-17 | Disposition: A | Discharge status: Home / Self Care | Payer: Commercial Managed Care - PPO | Source: Ambulatory Visit | Attending: Radiation Oncology | Admitting: Radiation Oncology

## 2018-08-17 DIAGNOSIS — C8332 Diffuse large B-cell lymphoma, intrathoracic lymph nodes: Secondary | ICD-10-CM | POA: Diagnosis not present

## 2018-08-17 DIAGNOSIS — R05 Cough: Secondary | ICD-10-CM | POA: Diagnosis not present

## 2018-08-17 DIAGNOSIS — R0781 Pleurodynia: Secondary | ICD-10-CM | POA: Diagnosis not present

## 2018-08-17 DIAGNOSIS — C8510 Unspecified B-cell lymphoma, unspecified site: Secondary | ICD-10-CM

## 2018-08-17 LAB — CBC
HCT: 41.6 % (ref 39.0–52.0)
Hemoglobin: 13.9 g/dL (ref 13.0–17.0)
MCH: 28.8 pg (ref 26.0–34.0)
MCHC: 33.4 g/dL (ref 30.0–36.0)
MCV: 86.3 fL (ref 80.0–100.0)
Platelets: 207 10*3/uL (ref 150–400)
RBC: 4.82 MIL/uL (ref 4.22–5.81)
RDW: 13.3 % (ref 11.5–15.5)
WBC: 9.1 10*3/uL (ref 4.0–10.5)
nRBC: 0 % (ref 0.0–0.2)

## 2018-08-18 ENCOUNTER — Ambulatory Visit
Admission: RE | Admit: 2018-08-18 | Discharge: 2018-08-18 | Disposition: A | Discharge status: Home / Self Care | Payer: Commercial Managed Care - PPO | Source: Ambulatory Visit | Attending: Radiation Oncology | Admitting: Radiation Oncology

## 2018-08-18 DIAGNOSIS — R0781 Pleurodynia: Secondary | ICD-10-CM | POA: Diagnosis not present

## 2018-08-18 DIAGNOSIS — R05 Cough: Secondary | ICD-10-CM | POA: Diagnosis not present

## 2018-08-18 DIAGNOSIS — C8332 Diffuse large B-cell lymphoma, intrathoracic lymph nodes: Secondary | ICD-10-CM | POA: Diagnosis not present

## 2018-08-19 ENCOUNTER — Other Ambulatory Visit: Payer: Self-pay

## 2018-08-19 ENCOUNTER — Encounter: Payer: Self-pay | Admitting: Nurse Practitioner

## 2018-08-19 ENCOUNTER — Telehealth: Payer: Self-pay | Admitting: *Deleted

## 2018-08-19 ENCOUNTER — Inpatient Hospital Stay: Payer: Commercial Managed Care - PPO

## 2018-08-19 ENCOUNTER — Inpatient Hospital Stay (HOSPITAL_BASED_OUTPATIENT_CLINIC_OR_DEPARTMENT_OTHER): Payer: Commercial Managed Care - PPO | Admitting: Nurse Practitioner

## 2018-08-19 ENCOUNTER — Ambulatory Visit
Admission: RE | Admit: 2018-08-19 | Discharge: 2018-08-19 | Disposition: A | Discharge status: Home / Self Care | Payer: Commercial Managed Care - PPO | Source: Ambulatory Visit | Attending: Radiation Oncology | Admitting: Radiation Oncology

## 2018-08-19 VITALS — BP 120/77 | HR 130 | Temp 98.2°F | Resp 20 | Wt 256.6 lb

## 2018-08-19 VITALS — BP 117/76 | HR 112

## 2018-08-19 DIAGNOSIS — C8332 Diffuse large B-cell lymphoma, intrathoracic lymph nodes: Secondary | ICD-10-CM | POA: Diagnosis not present

## 2018-08-19 DIAGNOSIS — R059 Cough, unspecified: Secondary | ICD-10-CM

## 2018-08-19 DIAGNOSIS — E876 Hypokalemia: Secondary | ICD-10-CM

## 2018-08-19 DIAGNOSIS — C8580 Other specified types of non-Hodgkin lymphoma, unspecified site: Secondary | ICD-10-CM

## 2018-08-19 DIAGNOSIS — Z95828 Presence of other vascular implants and grafts: Secondary | ICD-10-CM

## 2018-08-19 DIAGNOSIS — R0781 Pleurodynia: Secondary | ICD-10-CM

## 2018-08-19 DIAGNOSIS — R000 Tachycardia, unspecified: Secondary | ICD-10-CM | POA: Diagnosis not present

## 2018-08-19 DIAGNOSIS — R05 Cough: Secondary | ICD-10-CM

## 2018-08-19 DIAGNOSIS — C8510 Unspecified B-cell lymphoma, unspecified site: Secondary | ICD-10-CM

## 2018-08-19 DIAGNOSIS — E860 Dehydration: Secondary | ICD-10-CM

## 2018-08-19 LAB — CBC WITH DIFFERENTIAL/PLATELET
Abs Immature Granulocytes: 0.09 10*3/uL — ABNORMAL HIGH (ref 0.00–0.07)
Basophils Absolute: 0 10*3/uL (ref 0.0–0.1)
Basophils Relative: 0 %
Eosinophils Absolute: 0.1 10*3/uL (ref 0.0–0.5)
Eosinophils Relative: 0 %
HCT: 38.6 % — ABNORMAL LOW (ref 39.0–52.0)
Hemoglobin: 12.8 g/dL — ABNORMAL LOW (ref 13.0–17.0)
IMMATURE GRANULOCYTES: 1 %
Lymphocytes Relative: 3 %
Lymphs Abs: 0.4 10*3/uL — ABNORMAL LOW (ref 0.7–4.0)
MCH: 28.3 pg (ref 26.0–34.0)
MCHC: 33.2 g/dL (ref 30.0–36.0)
MCV: 85.4 fL (ref 80.0–100.0)
MONOS PCT: 12 %
Monocytes Absolute: 1.9 10*3/uL — ABNORMAL HIGH (ref 0.1–1.0)
Neutro Abs: 13.8 10*3/uL — ABNORMAL HIGH (ref 1.7–7.7)
Neutrophils Relative %: 84 %
Platelets: 245 10*3/uL (ref 150–400)
RBC: 4.52 MIL/uL (ref 4.22–5.81)
RDW: 13.7 % (ref 11.5–15.5)
WBC: 16.3 10*3/uL — ABNORMAL HIGH (ref 4.0–10.5)
nRBC: 0 % (ref 0.0–0.2)

## 2018-08-19 LAB — COMPREHENSIVE METABOLIC PANEL
ALT: 13 U/L (ref 0–44)
AST: 13 U/L — ABNORMAL LOW (ref 15–41)
Albumin: 3.7 g/dL (ref 3.5–5.0)
Alkaline Phosphatase: 69 U/L (ref 38–126)
Anion gap: 8 (ref 5–15)
BUN: 20 mg/dL (ref 6–20)
CO2: 28 mmol/L (ref 22–32)
Calcium: 8.8 mg/dL — ABNORMAL LOW (ref 8.9–10.3)
Chloride: 99 mmol/L (ref 98–111)
Creatinine, Ser: 1.08 mg/dL (ref 0.61–1.24)
GFR calc Af Amer: >60 mL/min (ref >60)
GFR calc non Af Amer: >60 mL/min (ref >60)
Glucose, Bld: 108 mg/dL — ABNORMAL HIGH (ref 70–99)
Potassium: 2.9 mmol/L — ABNORMAL LOW (ref 3.5–5.1)
Sodium: 135 mmol/L (ref 135–145)
Total Bilirubin: 1.5 mg/dL — ABNORMAL HIGH (ref 0.3–1.2)
Total Protein: 7 g/dL (ref 6.5–8.1)

## 2018-08-19 MED ORDER — HYDROCOD POLST-CPM POLST ER 10-8 MG/5ML PO SUER: 5.0000 mL | Freq: Two times a day (BID) | ORAL | 0 refills | Status: DC | PRN

## 2018-08-19 MED ORDER — GUAIFENESIN ER 600 MG PO TB12: 1200.0000 mg | ORAL_TABLET | Freq: Two times a day (BID) | ORAL | 0 refills | Status: DC | PRN

## 2018-08-19 MED ORDER — LEVOFLOXACIN 500 MG PO TABS: 500.0000 mg | ORAL_TABLET | Freq: Every day | ORAL | 0 refills | Status: AC

## 2018-08-19 MED ORDER — SODIUM CHLORIDE 0.9% FLUSH
10.0000 mL | Freq: Once | INTRAVENOUS | Status: AC
  Administered 2018-08-19: 10 mL via INTRAVENOUS
  Filled 2018-08-19: qty 10

## 2018-08-19 MED ORDER — POTASSIUM CHLORIDE CRYS ER 20 MEQ PO TBCR: 20.0000 meq | EXTENDED_RELEASE_TABLET | Freq: Two times a day (BID) | ORAL | 0 refills | Status: DC

## 2018-08-19 MED ORDER — SODIUM CHLORIDE 0.9 % IV SOLN
INTRAVENOUS | Status: DC
  Administered 2018-08-19: 15:00:00 via INTRAVENOUS
  Filled 2018-08-19 (×2): qty 250

## 2018-08-19 MED ORDER — HEPARIN SOD (PORK) LOCK FLUSH 100 UNIT/ML IV SOLN
500.0000 [IU] | Freq: Once | INTRAVENOUS | Status: AC
  Administered 2018-08-19: 500 [IU]
  Filled 2018-08-19: qty 5

## 2018-08-19 NOTE — Telephone Encounter (Signed)
Friday?

## 2018-08-19 NOTE — Progress Notes (Signed)
Patient c/o of cough that started on Sunday, right rib pain that started on Monday and shoulder pain that started on Tuesday.   Patient states that he is unable to take deep breaths due to the stabbing pain in rib and shoulder.  Patient has not tried taking any over the counter medications.

## 2018-08-19 NOTE — Telephone Encounter (Signed)
  When is his cardiology appointment?  M

## 2018-08-19 NOTE — Progress Notes (Signed)
Symptom Management [place]  Telephone:([phone] Fax:([phone]) [phone]  Patient Care Team: Ronnie Kayser, MD as PCP - General (Internal Medicine) Lequita Asal, MD as Medical Oncologist (Hematology and Oncology)   Name of the patient: Ronnie Mcintyre  [id number]  [birth year]   Date of visit: 08/19/18  [place]  Chief complaint/ Reason for visit-cough and back pain  Heme/Onc history:  Oncology History   Ronnie Mcintyre is a 53 y.o. 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  [facility] pathology revealed necrotic tissue. [facility] consultation revealed neoplastic cells are positive for CD20, BCL-6, MUM-1, and BCL-2. The neoplastic cells were negative for CD30, CD3, , CD10, c-MYC, EBV ISH, AE1/3, and PAX-8. Ki67 is 60-70%. 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 The absence of CD30 expression makes primary mediastinal large B-cell lymphoma less likely. FISH for MYC rearrangement was negative. IPI score, age adjusted IPI, are low (1) and NCCN IPI score is low-intermediate (2).  He presented with upper chest fullness with radiation to his neck and arm. He has had B symptoms (sweats and weight loss) worrisome for lymphoma. LDHis 273. Uric acid was normal.  Beta-HCG and AFP were normal on 01/16/2018.  Chest CT angiogramon 01/15/2018 revealed an 11.3 x 8.0 cm anterior mediastinal mass.  Abdomen and pelvic CT on 01/16/2018 revealeda 6.3 x 5.6 cm soft tissue mass or medium density fluid filling the urinary bladder.There was a1.5 cm mildly complex lower pole right renal cyst.There was a1.9 cm indeterminate left adrenal mass.There was a small pericardial effusion.  Renal ultrasound on 01/25/2018 revealed no evidence of bladder mass.  Finding on prior CT exam likely represented excretion of residual contrast material from a CTA chest  exam performed 1 day prior   PET scan on 01/26/2018 revealed an 11.1 cm intensely hypermetabolic and partially necrotic right anterior mediastinal mass, compatible with malignancy. There was diffuse marrow hypermetabolism, nonspecific, cannot exclude an infiltrative neoplastic marrow process. There was no focal skeletal hypermetabolism. There was no focal bone lesions on the CT images.  There was nonspecific mild heterogeneous prostatic hypermetabolism.  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    Bone marrow biopsy on 02/03/2018 revealed a slightly hypercellular bone marrow for age with trilineage hematopoiesis.  There was no monoclonal B-cell population or abnormal T-cell phenotype identified.  Cytogenetics were normal (46, XY).  Echo on 01/15/2018 revealed an EF of 55-60%.  Hepatitis B, hepatitis C, and HIV testing was negative on 02/10/2018. G6PD assay was normal.  He s/p cycle #1 RCHOP chemotherapy (02/16/2018) with Neulasta support.  Symptomatically, patient is doing well. He denies any acute symptoms. Patient denies that he has experienced any B symptoms. He denies any interval infections. Chest discomfort has has improved. He has "tingling" in the tips of his fingers.      Large B-cell lymphoma ([place])   01/15/2018 Initial Diagnosis    Large B-cell lymphoma ([place])    02/12/2018 -  Chemotherapy    The patient had DOXOrubicin (ADRIAMYCIN) chemo injection 122 mg, 50 mg/m2 = 122 mg, Intravenous,  Once, 6 of 6 cycles Administration: 122 mg (02/16/2018), 122 mg (03/09/2018), 122 mg (04/01/2018), 122 mg (04/29/2018), 122 mg (05/20/2018), 122 mg (06/10/2018) palonosetron (ALOXI) injection 0.25 mg, 0.25 mg, Intravenous,  Once, 6 of 6 cycles Administration: 0.25 mg (02/16/2018), 0.25 mg (03/09/2018), 0.25 mg (04/01/2018),  0.25 mg (04/29/2018), 0.25 mg (05/20/2018), 0.25 mg (06/10/2018) pegfilgrastim (NEULASTA) injection 6 mg, 6 mg,  Subcutaneous, Once, 6 of 6 cycles Administration: 6 mg (02/17/2018), 6 mg (03/10/2018), 6 mg (04/02/2018), 6 mg (04/30/2018), 6 mg (05/21/2018), 6 mg (06/11/2018) vinCRIStine (ONCOVIN) 2 mg in sodium chloride 0.9 % 50 mL chemo infusion, 2 mg, Intravenous,  Once, 6 of 6 cycles Administration: 2 mg (02/16/2018), 2 mg (03/09/2018), 2 mg (04/01/2018), 2 mg (04/29/2018), 2 mg (05/20/2018), 2 mg (06/10/2018) riTUXimab (RITUXAN) 900 mg in sodium chloride 0.9 % 250 mL (2.6471 mg/mL) infusion, 375 mg/m2 = 900 mg, Intravenous,  Once, 4 of 4 cycles Administration: 900 mg (02/16/2018) cyclophosphamide (CYTOXAN) 1,820 mg in sodium chloride 0.9 % 250 mL chemo infusion, 750 mg/m2 = 1,820 mg, Intravenous,  Once, 6 of 6 cycles Administration: 1,820 mg (02/16/2018), 1,820 mg (03/09/2018), 1,820 mg (04/01/2018), 1,820 mg (04/29/2018), 1,820 mg (05/20/2018), 1,820 mg (06/10/2018) riTUXimab (RITUXAN) 900 mg in sodium chloride 0.9 % 160 mL infusion, 375 mg/m2 = 900 mg, Intravenous,  Once, 3 of 3 cycles Dose modification: 375 mg/m2 (original dose 375 mg/m2, Cycle 6, Reason: Other (see comments), Comment: change to rapid) Administration: 900 mg (04/29/2018), 900 mg (05/20/2018), 900 mg (06/10/2018)  for chemotherapy treatment.      Interval history- Ronnie Mcintyre, 53 year old male with above history of large B-cell lymphoma, who presents to symptom management clinic for cough.  He states cough started about 2 weeks ago, ongoing since then. He describes feeling of phlegm in his throat that is difficult to clear.  Describes coughing caused to right side/rib and back pain.  He has been sleeping in the recliner.  Symptoms improved during the day and are aggravated by laying flat.  He is unaware of sick contacts but recently returned to work and is around a large number of people.  Denies fever or chills.  Denies nausea, vomiting, diarrhea.  Appetite is reduced.  Denies chest pain.  Denies shortness of breath.  He completed his  radiation treatment today to mediastinum.  ECOG FS:1 - Symptomatic but completely ambulatory  Review of systems- Review of Systems  Constitutional: Negative.   HENT: Negative.   Eyes: Negative.   Respiratory: Positive for cough and sputum production. Negative for hemoptysis, shortness of breath and wheezing.   Cardiovascular: Negative.   Gastrointestinal: Negative.   Genitourinary: Negative.   Musculoskeletal: Negative.   Skin: Negative.   Neurological: Negative.   Endo/Heme/Allergies: Negative.   Psychiatric/Behavioral: The patient is nervous/anxious.     Current treatment- s/p RCHOP & radiation (completed today)  No Known Allergies  Past Medical History:  Diagnosis Date  . Cancer ([place])   . Hypertension     Past Surgical History:  Procedure Laterality Date  . ANKLE SURGERY Right   . PORTACATH PLACEMENT Right 02/11/2018   Procedure: INSERTION PORT-A-CATH;  Surgeon: Jules Husbands, MD;  Location: [facility] ORS;  Service: General;  Laterality: Right;    Social History   Socioeconomic History  . Marital status: Single    Spouse name: Not on file  . Number of children: Not on file  . Years of education: Not on file  . Highest education level: Not on file  Occupational History  . Not on file  Social Needs  . Financial resource strain: Not on file  . Food insecurity:    Worry: Not on file    Inability: Not on file  . Transportation needs:    Medical: Not on file    Non-medical: Not  on file  Tobacco Use  . Smoking status: Never Smoker  . Smokeless tobacco: Never Used  Substance and Sexual Activity  . Alcohol use: Yes    Frequency: Never    Comment: rarely  . Drug use: No  . Sexual activity: Not on file  Lifestyle  . Physical activity:    Days per week: Not on file    Minutes per session: Not on file  . Stress: Not on file  Relationships  . Social connections:    Talks on phone: Not on file    Gets together: Not on file    Attends religious service: Not on  file    Active member of club or organization: Not on file    Attends meetings of clubs or organizations: Not on file    Relationship status: Not on file  . Intimate partner violence:    Fear of current or ex partner: Not on file    Emotionally abused: Not on file    Physically abused: Not on file    Forced sexual activity: Not on file  Other Topics Concern  . Not on file  Social History Narrative  . Not on file    Family History  Problem Relation Age of Onset  . Hypertension Mother   . Hypertension Father   . Multiple myeloma Father     Current Outpatient Medications:  .  acetaminophen (TYLENOL) 325 MG tablet, Take 2 tablets (650 mg total) by mouth every 6 (six) hours as needed for mild pain (or Fever >/= 101)., Disp: , Rfl:  .  aspirin 81 MG tablet, Take 81 mg by mouth daily. , Disp: , Rfl:  .  losartan-hydrochlorothiazide (HYZAAR) 100-25 MG tablet, Take 1 tablet by mouth daily., Disp: , Rfl:  .  potassium chloride (KLOR-CON M10) 10 MEQ tablet, TAKE 1 TABLET (10 MEQ) BY MOUTH DAILY., Disp: 90 tablet, Rfl: 1 .  simvastatin (ZOCOR) 40 MG tablet, Take 40 mg by mouth daily., Disp: , Rfl:  .  carvedilol (COREG) 3.125 MG tablet, Take 1 tablet (3.125 mg total) by mouth 2 (two) times daily with a meal., Disp: 180 tablet, Rfl: 3  Physical exam:  Vitals:   08/19/18 1359  BP: 120/77  Pulse: (!) 130  Resp: 20  Temp: 98.2 F (36.8 C)  SpO2: 97%  Weight: 256 lb 9.9 oz (116.4 kg)   Physical Exam Constitutional:      General: He is not in acute distress.    Appearance: Normal appearance.  HENT:     Head: Normocephalic.     Right Ear: Tympanic membrane, ear canal and external ear normal.     Left Ear: Tympanic membrane, ear canal and external ear normal.     Nose: Congestion and rhinorrhea present.     Mouth/Throat:     Mouth: Mucous membranes are moist.     Pharynx: Oropharynx is clear. Posterior oropharyngeal erythema present. No oropharyngeal exudate.  Eyes:     General: No  scleral icterus.    Conjunctiva/sclera: Conjunctivae normal.     Pupils: Pupils are equal, round, and reactive to light.  Neck:     Musculoskeletal: Normal range of motion and neck supple. No muscular tenderness.  Cardiovascular:     Rate and Rhythm: Regular rhythm. Tachycardia present.     Pulses: Normal pulses.     Heart sounds: Normal heart sounds.  Pulmonary:     Effort: Pulmonary effort is normal. No respiratory distress.     Breath sounds: Normal  breath sounds. No wheezing or rhonchi.  Abdominal:     General: There is no distension.     Tenderness: There is no abdominal tenderness.  Musculoskeletal:        General: No swelling or tenderness.  Lymphadenopathy:     Cervical: No cervical adenopathy.  Skin:    General: Skin is warm and dry.  Neurological:     Mental Status: He is alert and oriented to person, place, and time.     Motor: No weakness.     Gait: Gait normal.  Psychiatric:        Mood and Affect: Mood is anxious.      CMP Latest Ref Rng & Units 07/06/2018  Glucose 70 - 99 mg/dL 109(H)  BUN 6 - 20 mg/dL 16  Creatinine 0.61 - 1.24 mg/dL 1.09  Sodium 135 - 145 mmol/L 139  Potassium 3.5 - 5.1 mmol/L 3.4(L)  Chloride 98 - 111 mmol/L 105  CO2 22 - 32 mmol/L 26  Calcium 8.9 - 10.3 mg/dL 9.4  Total Protein 6.5 - 8.1 g/dL 6.8  Total Bilirubin 0.3 - 1.2 mg/dL 1.3(H)  Alkaline Phos 38 - 126 U/L 57  AST 15 - 41 U/L 28  ALT 0 - 44 U/L 34   CBC Latest Ref Rng & Units 08/17/2018  WBC 4.0 - 10.5 K/uL 9.1  Hemoglobin 13.0 - 17.0 g/dL 13.9  Hematocrit 39.0 - 52.0 % 41.6  Platelets 150 - 400 K/uL 207    No images are attached to the encounter.  No results found.  Assessment and plan- Patient is a 53 y.o. male diagnosed with large B-cell lymphoma who presents to symptom management clinic for cough and pain.  1.  Clinical stage Ib bulky large B-cell lymphoma-initially presented with difficulty swallowing.  S/p CT-guided biopsy.  Pathology reviewed at [facility] consistent  with large B-cell lymphoma. Received 6 cycles of R-CHOP chemotherapy 02/16/18-06/10/18.  Imaging demonstrated interval response to therapy with decrease in size.  He received adjuvant radiation therapy 07/26/2018-08/19/2018.  Last radiation treatment was today.  2. Cough- Infectious vs radiation induced. Afebrile. ANC 13.8, wbc 16.3. Start mucinex 1200 mg q12h prn for cough. Prescription for Tussionex for cough interfering with sleep or cough unrelieved by mucinex. Encouraged oral fluid intake to help thin secretions. Start Levaquin 500 mg po daily x 5 days.   3. Rib & Back Pain- secondary to cough. Alternate tylenol (not to exceed 3g in 24 hours) and motrin (GI precautions discussed). Call clinic if unrelieved.   4. Tachycardia- likely secondary to poor oral intake. IV fluids given with improvement in HR to baseline.   5. Hypokalemia- likely r/t poor oral intake and hctz. Will increase potassium supplementation to 32mq  and recheck in 1 week.   Currently no follow up with Dr. CMike Gipscheduled. Discussed with patient. I have reached out to Dr. CKem Parkinsonteam to request follow up appt be made and advised patient of this. Patient advised to notify the clinic if there is no improvement in symptoms or if symptoms worsen in next 3-4 days. Lab recheck in 1 week.     Visit Diagnosis 1. [place]   2. Cough   3. Rib pain on right side   4. Tachycardia     Patient expressed understanding and was in agreement with this plan. He also understands that He can call clinic at any time with any questions, concerns, or complaints.   Thank you for allowing me to participate in the care  of this very pleasant patient.   Beckey Rutter, DNP, AGNP-C [place] at [facility] [phone] (work cell) [phone] (office)  CC: Dr. Mike Gip & Honor Loh, NP

## 2018-08-19 NOTE — Telephone Encounter (Signed)
Patient seen in symptom mgmt clinic today for a cough/back pain/dehydration. Pt given 1 liter of fluids today.  He does not have any further follow-up with Dr. Mike Gip. He completed his radiation therapy today. He would like follow-ups in [place] if possible. I flushed his port today. Please advise and f/u with patient when next apt with Dr. Mike Gip is needed. Thanks.

## 2018-08-20 NOTE — Telephone Encounter (Signed)
Ronnie Mcintyre Bidding his appt is today or next Friday

## 2018-08-26 ENCOUNTER — Other Ambulatory Visit: Payer: Commercial Managed Care - PPO

## 2018-08-26 ENCOUNTER — Other Ambulatory Visit: Payer: Self-pay | Admitting: Hematology and Oncology

## 2018-08-26 ENCOUNTER — Inpatient Hospital Stay (HOSPITAL_BASED_OUTPATIENT_CLINIC_OR_DEPARTMENT_OTHER): Payer: Commercial Managed Care - PPO | Admitting: Hematology and Oncology

## 2018-08-26 ENCOUNTER — Inpatient Hospital Stay: Payer: Commercial Managed Care - PPO | Attending: Hematology and Oncology

## 2018-08-26 VITALS — BP 110/79 | HR 112 | Temp 98.2°F | Resp 18 | Wt 246.7 lb

## 2018-08-26 DIAGNOSIS — J069 Acute upper respiratory infection, unspecified: Secondary | ICD-10-CM

## 2018-08-26 DIAGNOSIS — R000 Tachycardia, unspecified: Secondary | ICD-10-CM | POA: Insufficient documentation

## 2018-08-26 DIAGNOSIS — C8510 Unspecified B-cell lymphoma, unspecified site: Secondary | ICD-10-CM

## 2018-08-26 DIAGNOSIS — C8332 Diffuse large B-cell lymphoma, intrathoracic lymph nodes: Secondary | ICD-10-CM | POA: Diagnosis not present

## 2018-08-26 DIAGNOSIS — R012 Other cardiac sounds: Secondary | ICD-10-CM | POA: Diagnosis not present

## 2018-08-26 DIAGNOSIS — R634 Abnormal weight loss: Secondary | ICD-10-CM

## 2018-08-26 DIAGNOSIS — Z79899 Other long term (current) drug therapy: Secondary | ICD-10-CM | POA: Diagnosis not present

## 2018-08-26 DIAGNOSIS — R1012 Left upper quadrant pain: Secondary | ICD-10-CM

## 2018-08-26 DIAGNOSIS — Z923 Personal history of irradiation: Secondary | ICD-10-CM | POA: Diagnosis not present

## 2018-08-26 DIAGNOSIS — E876 Hypokalemia: Secondary | ICD-10-CM

## 2018-08-26 LAB — TSH: TSH: 0.864 u[IU]/mL (ref 0.350–4.500)

## 2018-08-26 LAB — CBC WITH DIFFERENTIAL/PLATELET
Abs Immature Granulocytes: 0.03 10*3/uL (ref 0.00–0.07)
Basophils Absolute: 0.1 10*3/uL (ref 0.0–0.1)
Basophils Relative: 1 %
Eosinophils Absolute: 0.2 10*3/uL (ref 0.0–0.5)
Eosinophils Relative: 3 %
HCT: 42.2 % (ref 39.0–52.0)
Hemoglobin: 14 g/dL (ref 13.0–17.0)
Immature Granulocytes: 0 %
Lymphocytes Relative: 4 %
Lymphs Abs: 0.3 10*3/uL — ABNORMAL LOW (ref 0.7–4.0)
MCH: 28.6 pg (ref 26.0–34.0)
MCHC: 33.2 g/dL (ref 30.0–36.0)
MCV: 86.3 fL (ref 80.0–100.0)
Monocytes Absolute: 0.7 10*3/uL (ref 0.1–1.0)
Monocytes Relative: 9 %
Neutro Abs: 6.7 10*3/uL (ref 1.7–7.7)
Neutrophils Relative %: 83 %
Platelets: 356 10*3/uL (ref 150–400)
RBC: 4.89 MIL/uL (ref 4.22–5.81)
RDW: 13.2 % (ref 11.5–15.5)
WBC: 8.1 10*3/uL (ref 4.0–10.5)
nRBC: 0 % (ref 0.0–0.2)

## 2018-08-26 LAB — COMPREHENSIVE METABOLIC PANEL
ALT: 24 U/L (ref 0–44)
AST: 21 U/L (ref 15–41)
Albumin: 3.6 g/dL (ref 3.5–5.0)
Alkaline Phosphatase: 104 U/L (ref 38–126)
Anion gap: 12 (ref 5–15)
BUN: 25 mg/dL — ABNORMAL HIGH (ref 6–20)
CO2: 24 mmol/L (ref 22–32)
Calcium: 9.2 mg/dL (ref 8.9–10.3)
Chloride: 100 mmol/L (ref 98–111)
Creatinine, Ser: 1.04 mg/dL (ref 0.61–1.24)
GFR calc Af Amer: >60 mL/min (ref >60)
GFR calc non Af Amer: >60 mL/min (ref >60)
Glucose, Bld: 108 mg/dL — ABNORMAL HIGH (ref 70–99)
Potassium: 3.9 mmol/L (ref 3.5–5.1)
Sodium: 136 mmol/L (ref 135–145)
Total Bilirubin: 0.8 mg/dL (ref 0.3–1.2)
Total Protein: 7.3 g/dL (ref 6.5–8.1)

## 2018-08-26 LAB — LACTATE DEHYDROGENASE: LDH: 143 U/L (ref 98–192)

## 2018-08-26 LAB — URIC ACID: Uric Acid, Serum: 7 mg/dL (ref 3.7–8.6)

## 2018-08-26 NOTE — Progress Notes (Signed)
Pt here for follow up. Reports cough and congestion is improving since being seen in symptom management on 08/19/2018. Appetite is decreased to about 50% and reports some issues with sleeping at night. Denies any other concerns at this time.

## 2018-08-26 NOTE — Progress Notes (Signed)
[facility] day:  08/26/2018   Chief Complaint: Ronnie Mcintyre is a 53 y.o. male with a large B-cell lymphoma who is seen for assessment after interval radiation.  HPI:  The patient was last seen in the medical oncology clinic on 07/06/2018.  At that time, he denied any B symptoms. Exam was unremarkable.  PET scan revealed response to therapy.  He was referred to radiation oncology.  He was presented at tumor board.  Plan was for consolidative radiation.  He saw Dr Baruch Gouty on 07/15/2018.  Plan was for 3600 cGy in 18 fractions.  He received radiation from 07/27/2018 - 08/19/2018.  He saw Beckey Rutter, NP on 08/19/2018 for cough and back pain.  He described a cough x 2 weeks with phlegm production.  Cough caused back and rib pain.  He was prescribed mucinex 1200 mg BID PRN for cough, Tussionex for cough unrelieved by mucinex, and Levaquin 500 mg daily x 5 days.  Hematocrit was 38.6, hemoglobin 12.8, platelets 245,000, WBC 16,300.  Creatinine was 1.08.  Bilirubin 1.5.  Potassium was 2.9.  Potassium was increased to 20 mEq a day.  During the interim, patient has been doing well following treatment of his acute illness on 08/19/2018. He notes that he has had to sleep in a recliner some this week secondary to RIGHT pleuritic chest pain. Pain at this point has resolved, and patient is able to lay flat. Patient has some residual productive cough. He states, "I am still bringing up dark phlegmy stuff in the mornings". Patient notes that his energy has improved. He is back to work 9.5 hr/day x 6 days a week.   Patient denies any increased shortness of breath. He has been able to perceive an elevated heart rate. Resting rates at home has been in the 120s. He presents today in the 110s.   Patient's appetite was reduced during his acute illness. Appetite is slowly improving. Weight today is 246 lb 11.1 oz (111.9 kg), which compared to his last visit to the clinic on  07/07/2019, represents an 11 pound decrease.  Patient denies pain in the clinic today.   Past Medical History:  Diagnosis Date  . Cancer ([place])   . Hypertension     Past Surgical History:  Procedure Laterality Date  . ANKLE SURGERY Right   . PORTACATH PLACEMENT Right 02/11/2018   Procedure: INSERTION PORT-A-CATH;  Surgeon: Jules Husbands, MD;  Location: [facility] ORS;  Service: General;  Laterality: Right;    Family History  Problem Relation Age of Onset  . Hypertension Mother   . Hypertension Father   . Multiple myeloma Father     Social History:  reports that he has never smoked. He has never used smokeless tobacco. He reports current alcohol use. He reports that he does not use drugs.  He drinks a beer 1-2 x/week.  He works for [employer].  He plans to return to work on 07/22/2018.  He lives in [place].  His is alone today.  Allergies: No Known Allergies  Current Medications: Current Outpatient Medications  Medication Sig Dispense Refill  . acetaminophen (TYLENOL) 325 MG tablet Take 2 tablets (650 mg total) by mouth every 6 (six) hours as needed for mild pain (or Fever >/= 101).    [name] aspirin 81 MG tablet Take 81 mg by mouth daily.     . carvedilol (COREG) 3.125 MG tablet Take 1 tablet (3.125 mg total) by mouth 2 (two)  times daily with a meal. 180 tablet 3  . chlorpheniramine-HYDROcodone (TUSSIONEX) 10-8 MG/5ML SUER Take 5 mLs by mouth every 12 (twelve) hours as needed for cough. 115 mL 0  . guaiFENesin (MUCINEX) 600 MG 12 hr tablet Take 2 tablets (1,200 mg total) by mouth 2 (two) times daily as needed for cough or to loosen phlegm. 28 tablet 0  . losartan-hydrochlorothiazide (HYZAAR) 100-25 MG tablet Take 1 tablet by mouth daily.    . potassium chloride SA (K-DUR,KLOR-CON) 20 MEQ tablet Take 1 tablet (20 mEq total) by mouth 2 (two) times daily. For low potassium 14 tablet 0  . simvastatin (ZOCOR) 40 MG tablet Take 40 mg by mouth daily.     No current  facility-administered medications for this visit.     Review of Systems  Constitutional: Positive for weight loss (11 pounds). Negative for chills, diaphoresis, fever and malaise/fatigue.       Feeling better.   More energy.  HENT: Negative.  Negative for congestion, ear pain, nosebleeds, sinus pain, sore throat and tinnitus.   Eyes: Negative.  Negative for blurred vision, double vision, photophobia and pain.  Respiratory: Positive for cough (residual) and sputum production (some dark phlegm). Negative for hemoptysis, shortness of breath and wheezing.        Pleuritic pain, resolved after acute illness.  Cardiovascular: Negative for chest pain, palpitations, orthopnea, leg swelling and PND.       Tachycardia.  Gastrointestinal: Negative.  Negative for abdominal pain, blood in stool, constipation, diarrhea, melena, nausea and vomiting.       Appetite improving.  Genitourinary: Negative.  Negative for dysuria, frequency, hematuria and urgency.  Musculoskeletal: Negative.  Negative for back pain, falls, joint pain, myalgias and neck pain.  Skin: Negative.  Negative for itching and rash.  Neurological: Negative.  Negative for dizziness, tingling, sensory change, speech change, focal weakness, weakness and headaches.  Endo/Heme/Allergies: Negative.  Does not bruise/bleed easily.  Psychiatric/Behavioral: Negative.  Negative for depression and memory loss. The patient is not nervous/anxious and does not have insomnia.   All other systems reviewed and are negative.  Performance status (ECOG): 1  Vital Signs BP 110/79 (BP Location: Left Arm, Patient Position: Sitting)   Pulse (!) 112 Comment: Apical, Pulses Weak  Temp 98.2 F (36.8 C) (Oral)   Resp 18   Wt 246 lb 11.1 oz (111.9 kg)   SpO2 99%   BMI 33.46 kg/m   Physical Exam  Constitutional: He is oriented to person, place, and time and well-developed, well-nourished, and in no distress. No distress.  HENT:  Head: Normocephalic and  atraumatic.  Mouth/Throat: Oropharynx is clear and moist and mucous membranes are normal. No oropharyngeal exudate.  Alopecia.  Eyes: Pupils are equal, round, and reactive to light. Conjunctivae and EOM are normal. No scleral icterus.  Blue.  Neck: Normal range of motion. Neck supple.  Cardiovascular: Regular rhythm, normal heart sounds and intact distal pulses. Tachycardia present. Exam reveals no gallop and no friction rub.  No murmur heard. Pulmonary/Chest: Effort normal and breath sounds normal. No respiratory distress. He has no wheezes. He has no rales.  Abdominal: Soft. Bowel sounds are normal. He exhibits no distension and no mass. There is no abdominal tenderness. There is no rebound and no guarding.  Musculoskeletal: Normal range of motion.        General: No tenderness or edema.  Lymphadenopathy:    He has no cervical adenopathy.    He has no axillary adenopathy.  Right: No supraclavicular adenopathy present.       Left: No supraclavicular adenopathy present.  Neurological: He is alert and oriented to person, place, and time. Gait normal.  Skin: Skin is warm and dry. No rash noted. He is not diaphoretic. No erythema. No pallor.  Psychiatric: Mood, affect and judgment normal.  Nursing note and vitals reviewed.   Appointment on 08/26/2018  Component Date Value Ref Range Status  . Uric Acid, Serum 08/26/2018 7.0  3.7 - 8.6 mg/dL Final   Performed at [facility], [address], [place], [place] [address]  . LDH 08/26/2018 143  98 - 192 U/L Final   Performed at [facility], [address] [place], [place] [address]  . Sodium 08/26/2018 136  135 - 145 mmol/L Final  . Potassium 08/26/2018 3.9  3.5 - 5.1 mmol/L Final  . Chloride 08/26/2018 100  98 - 111 mmol/L Final  . CO2 08/26/2018 24  22 - 32 mmol/L Final  . Glucose, Bld 08/26/2018 108* 70 - 99 mg/dL Final  . BUN 08/26/2018 25* 6 - 20 mg/dL Final  . Creatinine, Ser 08/26/2018 1.04  0.61 -  1.24 mg/dL Final  . Calcium 08/26/2018 9.2  8.9 - 10.3 mg/dL Final  . Total Protein 08/26/2018 7.3  6.5 - 8.1 g/dL Final  . Albumin 08/26/2018 3.6  3.5 - 5.0 g/dL Final  . AST 08/26/2018 21  15 - 41 U/L Final  . ALT 08/26/2018 24  0 - 44 U/L Final  . Alkaline Phosphatase 08/26/2018 104  38 - 126 U/L Final  . Total Bilirubin 08/26/2018 0.8  0.3 - 1.2 mg/dL Final  . GFR calc non Af Amer 08/26/2018 >60  >60 mL/min Final  . GFR calc Af Amer 08/26/2018 >60  >60 mL/min Final  . Anion gap 08/26/2018 12  5 - 15 Final   Performed at [facility], [address], [place], [place] [address]  . WBC 08/26/2018 8.1  4.0 - 10.5 K/uL Final  . RBC 08/26/2018 4.89  4.22 - 5.81 MIL/uL Final  . Hemoglobin 08/26/2018 14.0  13.0 - 17.0 g/dL Final  . HCT 08/26/2018 42.2  39.0 - 52.0 % Final  . MCV 08/26/2018 86.3  80.0 - 100.0 fL Final  . MCH 08/26/2018 28.6  26.0 - 34.0 pg Final  . MCHC 08/26/2018 33.2  30.0 - 36.0 g/dL Final  . RDW 08/26/2018 13.2  11.5 - 15.5 % Final  . Platelets 08/26/2018 356  150 - 400 K/uL Final  . nRBC 08/26/2018 0.0  0.0 - 0.2 % Final  . Neutrophils Relative % 08/26/2018 83  % Final  . Neutro Abs 08/26/2018 6.7  1.7 - 7.7 K/uL Final  . Lymphocytes Relative 08/26/2018 4  % Final  . Lymphs Abs 08/26/2018 0.3* 0.7 - 4.0 K/uL Final  . Monocytes Relative 08/26/2018 9  % Final  . Monocytes Absolute 08/26/2018 0.7  0.1 - 1.0 K/uL Final  . Eosinophils Relative 08/26/2018 3  % Final  . Eosinophils Absolute 08/26/2018 0.2  0.0 - 0.5 K/uL Final  . Basophils Relative 08/26/2018 1  % Final  . Basophils Absolute 08/26/2018 0.1  0.0 - 0.1 K/uL Final  . Immature Granulocytes 08/26/2018 0  % Final  . Abs Immature Granulocytes 08/26/2018 0.03  0.00 - 0.07 K/uL Final   Performed at [facility], [address], [place], [place] [address]    Assessment:  Ronnie Mcintyre is a 53 y.o.  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  [facility] pathology revealed necrotic tissue. [facility] consultation revealed neoplastic cells are positive for CD20, BCL-6, MUM-1, and BCL-2. The neoplastic cells were negative for CD30, CD3, , CD10, c-MYC, EBV ISH, AE1/3, and PAX-8. Ki67 is 60-70%. 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 The absence of CD30 expression makes primary mediastinal large B-cell lymphoma less likely. FISH for MYC rearrangement was negative. IPI score, age adjusted IPI, are low (1) and NCCN IPI score is low-intermediate (2).  He presented with upper chest fullness with radiation to his neck and arm. He has had B symptoms (sweats and weight loss) worrisome for lymphoma. LDHis 273. Uric acid was normal.  Beta-HCG and AFP were normal on 01/16/2018.  Chest CT angiogramon 01/15/2018 revealed an 11.3 x 8.0 cm anterior mediastinal mass.  Abdomen and pelvic CT on 01/16/2018 revealed a 6.3 x 5.6 cm soft tissue mass or medium density fluid filling the urinary bladder.  There was a 1.5 cm mildly complex lower pole right renal cyst. There was a 1.9 cm indeterminate left adrenal mass.  There was a small pericardial effusion.  Renal ultrasound on 01/25/2018 revealed no evidence of bladder mass.  Finding on prior CT exam likely represented excretion of residual contrast material from a CTA chest exam performed 1 day prior   PET scan on 01/26/2018 revealed an 11.1 cm intensely hypermetabolic and partially necrotic right anterior mediastinal mass, compatible with malignancy. There was diffuse marrow hypermetabolism, nonspecific, cannot exclude an infiltrative neoplastic marrow process. There was no focal skeletal hypermetabolism. There was no focal bone lesions on the CT images.  There was nonspecific mild heterogeneous prostatic hypermetabolism.  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    Bone marrow biopsy on  02/03/2018 revealed a slightly hypercellular bone marrow for age with trilineage hematopoiesis.  There was no monoclonal B-cell population or abnormal T-cell phenotype identified.  Cytogenetics were normal (46, XY).  Echo on 01/15/2018 revealed an EF of 55-60%.  Echo on 04/22/2018 revealed an EF of 50 to 55%. There was evidence of probable severe hypokinesis of the mid apical-anteroseptal, anterior, and apical myocardium.  Treadmill Myoview on 04/26/2018 revealed a low risk study with a small defect present in the apex location that was partially reversible and could represent small previous myocardial infarction. However, and apical thinning artifact could not be excluded.  Nuclear stress EF: 50%.  Echo on 05/17/2018 revealed an EF of 50-55%.  Hepatitis B, hepatitis C, and HIV testing was negative on 02/10/2018.  G6PD assay was normal.  He received 6 cycles of RCHOP chemotherapy (02/16/2018 - 06/10/2018) with Neulasta support.  He received radiation (3600 cGy in 18 fractions) from 07/27/2018 - 08/19/2018.  Chest, abdomen, and pelvic CT on 04/19/2018 revealed the anterior mediastinal mass had significantly reduced in size (10.1 x 6.9 cm to 6.8 by 4.5 cm).  The smaller left eccentric mediastinal lymph node measured 1.4 x 4.7 cm, previously 2.0 x 3.2 cm.  There was no appreciable adenopathy in the abdomen/pelvis.  There were bilateral hypodense renal lesions technically too small to characterize, including the right kidney lower pole lesion.   PET scan on 07/02/2018 revealed clear interval response to therapy with decrease in size and hypermetabolism of the anterior mediastinal mass (6.9 x 11.1 cm to 2 x 2.6 cm; SUV 14.3 to 5.3).  There  was a similar appearance of the diffuse marrow hypermetabolism, nonspecific.  There was a 4.2 cm ascending thoracic aortic diameter.  There was a stable left adrenal adenoma.  He was diagnosed with T6-T7 herpes zoster on 03/19/2018.  He was treated with valacyclovir.  He is  on prophylactic valacyclovir.    Symptomatically, he is recovering from an acute upper respiratory infection.  He lost weight when he was ill.  Exam reveals no adenopathy or hepatosplenomegaly.  Plan: 1. Labs today:  CBC with diff, CMP, LDH, uric acid. 2. Stage IB diffuse large B cell lymphoma Clinically he is doing well. He is s/p radiation. Discuss plans for end of therapy PET scan 3 months after completion of radiation. Schedule PET scan on 11/18/2018. 3. Upper respiratory illness and weight loss  Patient is doing better.  Symptoms have dramatically improved after completion of antibiotics. Appetite is improving. Discuss plan for follow-up weight check in 2 weeks.  4. Cardiac wall motion abnormalities Echo on 05/17/2018 revealed an EF of 50-55%. He had mild hypokinesis of the mid-apicalanteroseptal myocardium.  He is followed by Dr. Fletcher Anon.  5. Tachycardia  Patient has a resting tachycardia.  Check TSH  Follow-up with cardiology. 6. Hypokalemia Potassium was 2.9 on 08/19/2018. Potassium 3.9 today on supplementation. Decrease potassium chloride to 10 mew q day. 7. Elevated bilirubin  Bilirubin 0.8 today.   Patient has a history of an intermittent mild hyperbilirubinemia.  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  Continue to monitor. 8.   RTC in 2 weeks for labs (BMP) and weight check. 9.   RTC after PET scan for MD assessment, labs (CBC with diff, CMP, LDH, uric acid), and review of imaging.   Honor Loh, NP  08/26/2018, 9:18 AM   I saw and evaluated the patient, participating in the key portions of the service and reviewing pertinent diagnostic studies and records.  I reviewed the nurse practitioner's note and agree with the findings and the plan.  The assessment and plan were discussed with the patient.  Multiple questions were asked by the patient and answered.   Nolon Stalls, MD 08/26/2018,9:18 AM

## 2018-08-27 ENCOUNTER — Ambulatory Visit: Payer: Commercial Managed Care - PPO | Admitting: Cardiovascular Disease

## 2018-08-27 ENCOUNTER — Encounter: Payer: Self-pay | Admitting: Cardiovascular Disease

## 2018-08-27 VITALS — BP 104/70 | HR 99 | Ht 72.0 in | Wt 247.0 lb

## 2018-08-27 DIAGNOSIS — I10 Essential (primary) hypertension: Secondary | ICD-10-CM | POA: Diagnosis not present

## 2018-08-27 DIAGNOSIS — E785 Hyperlipidemia, unspecified: Secondary | ICD-10-CM

## 2018-08-27 DIAGNOSIS — R0602 Shortness of breath: Secondary | ICD-10-CM

## 2018-08-27 MED ORDER — LOSARTAN POTASSIUM 100 MG PO TABS: 100.0000 mg | ORAL_TABLET | Freq: Every day | ORAL | 3 refills | Status: DC

## 2018-08-27 MED ORDER — CARVEDILOL 6.25 MG PO TABS: 6.2500 mg | ORAL_TABLET | Freq: Two times a day (BID) | ORAL | 3 refills | Status: DC

## 2018-08-27 NOTE — Patient Instructions (Addendum)
Medication Instructions:  STOP the potassium INCREASE the Cardevilol to 6.25 mg twice daily STOP the Hydrochlorothiazide 25 mg daily  If you need a refill on your cardiac medications before your next appointment, please call your pharmacy.   Lab work: None ordered  Testing/Procedures: Your physician has requested that you have an echocardiogram. Echocardiography is a painless test that uses sound waves to create images of your heart. It provides your doctor with information about the size and shape of your heart and how well your heart's chambers and valves are working. You may receive an ultrasound enhancing agent through an IV if needed to better visualize your heart during the echo.This procedure takes approximately one hour. There are no restrictions for this procedure. This will take place at the [facility] clinic.    Follow-Up: At [facility], you and your health needs are our priority.  As part of our continuing mission to provide you with exceptional heart care, we have created designated Provider Care Teams.  These Care Teams include your primary Cardiologist (physician) and Advanced Practice Providers (APPs -  Physician Assistants and Nurse Practitioners) who all work together to provide you with the care you need, when you need it. You will need a follow up appointment in 3 months. You may see Dr. Fletcher Anon or one of the following Advanced Practice Providers on your designated Care Team:   Murray Hodgkins, NP Christell Faith, PA-C . Marrianne Mood, PA-C

## 2018-08-27 NOTE — Progress Notes (Signed)
Cardiology Office Note   Date:  08/27/2018   ID:  Ronnie Mcintyre, DOB 07-05-1966, MRN [id number]  PCP:  [name], MD  Cardiologist:   [name], MD   Chief Complaint  Patient presents with  . Other    3 month follow up. Patient c/o HR running about 110-115 but states he does think he is drinking enough fluids. Meds reviewed verbally with patient.       History of Present Illness: Ronnie Mcintyre is a 53 y.o. 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   The patient has known history of obesity, hypertension and hyperlipidemia.  He has no prior cardiac history.  He is not a smoker or diabetic.  He has no family history of coronary artery disease. 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  He had an echocardiogram done in June 2019 before chemotherapy which showed an EF of 55 to 60% with no wall motion abnormalities and mildly dilated aortic root at 40 mmHg.  He had a repeat echocardiogram after third cycle of chemotherapy which showed an EF of 50 to 55% with probable severe hypokinesis of the mid to distal anteroseptal, anterior and apical myocardium.   Treadmill Myoview in October was done.He was able to exercise for 4 minutes and 45 seconds.  Perfusion was suboptimal overall but there was a small apical defect likely due to to apical thinning although a small infarct could not be excluded.  Nonetheless, it was a low risk study with EF of 50%. After that, I started the patient on small dose carvedilol given his resting tachycardia.    He finished chemotherapy followed by extensive radiation therapy.  After that, he developed severe cough and some shortness of breath that ultimately improved with Mucinex and Tussionex.  He has mild residual shortness of breath.  He also did have pleuritic chest pain while he was receiving radiation therapy. He did have an episode where he felt  that he was dehydrated and had sinus tachycardia.  He also was hypokalemic.  He takes hydrochlorothiazide.  Past Medical History:  Diagnosis Date  . Cancer ([place])   . Hypertension     Past Surgical History:  Procedure Laterality Date  . ANKLE SURGERY Right   . PORTACATH PLACEMENT Right 02/11/2018   Procedure: INSERTION PORT-A-CATH;  Surgeon: Jules Husbands, MD;  Location: [facility] ORS;  Service: General;  Laterality: Right;     Current Outpatient Medications  Medication Sig Dispense Refill  . acetaminophen (TYLENOL) 325 MG tablet Take 2 tablets (650 mg total) by mouth every 6 (six) hours as needed for mild pain (or Fever >/= 101).    [name] aspirin 81 MG tablet Take 81 mg by mouth daily.     . chlorpheniramine-HYDROcodone (TUSSIONEX) 10-8 MG/5ML SUER Take 5 mLs by mouth every 12 (twelve) hours as needed for cough. 115 mL 0  . guaiFENesin (MUCINEX) 600 MG 12 hr tablet Take 2 tablets (1,200 mg total) by mouth 2 (two) times daily as needed for cough or to loosen phlegm. 28 tablet 0  . losartan-hydrochlorothiazide (HYZAAR) 100-25 MG tablet Take 1 tablet by mouth daily.    . potassium chloride SA (K-DUR,KLOR-CON) 20 MEQ tablet Take 1 tablet (20 mEq total) by mouth 2 (two) times daily. For low potassium 14 tablet 0  . simvastatin (ZOCOR) 40 MG tablet Take 40 mg by mouth daily.    . carvedilol (COREG) 3.125  MG tablet Take 1 tablet (3.125 mg total) by mouth 2 (two) times daily with a meal. 180 tablet 3   No current facility-administered medications for this visit.     Allergies:   Patient has no known allergies.    Social History:  The patient  reports that he has never smoked. He has never used smokeless tobacco. He reports current alcohol use. He reports that he does not use drugs.   Family History:  The patient's family history includes Hypertension in his father and mother; Multiple myeloma in his father.    ROS:  Please see the history of present illness.   Otherwise, review of systems are  positive for none.   All other systems are reviewed and negative.    PHYSICAL EXAM: VS:  BP 140/70 (BP Location: Left Arm, Patient Position: Sitting, Cuff Size: Normal)   Pulse 99   Ht 6' (1.829 m)   Wt 247 lb (112 kg)   BMI 33.50 kg/m  , BMI Body mass index is 33.5 kg/m. GEN: Well nourished, well developed, in no acute distress  HEENT: normal  Neck: no JVD, carotid bruits, or masses Cardiac: RRR; no murmurs, rubs, or gallops,no edema  Respiratory:  clear to auscultation bilaterally, normal work of breathing GI: soft, nontender, nondistended, + BS MS: no deformity or atrophy  Skin: warm and dry, no rash Neuro:  Strength and sensation are intact Psych: euthymic mood, full affect   EKG:  EKG is ordered today. The ekg ordered today demonstrates normal sinus rhythm with left axis deviation, old inferior and anteroseptal infarct.  PVCs noted.   Recent Labs: 07/06/2018: Magnesium 1.8 08/26/2018: ALT 24; BUN 25; Creatinine, Ser 1.04; Hemoglobin 14.0; Platelets 356; Potassium 3.9; Sodium 136; TSH 0.864    Lipid Panel No results found for: CHOL, TRIG, HDL, CHOLHDL, VLDL, LDLCALC, LDLDIRECT    Wt Readings from Last 3 Encounters:  08/27/18 247 lb (112 kg)  08/26/18 246 lb 11.1 oz (111.9 kg)  08/19/18 256 lb 9.9 oz (116.4 kg)      PAD Screen 04/22/2018  Previous PAD dx? No  Previous surgical procedure? No  Pain with walking? No  Feet/toe relief with dangling? No  Painful, non-healing ulcers? No  Extremities discolored? No      ASSESSMENT AND PLAN:  1.  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  I am going to obtain a repeat echocardiogram especially with his recent episodes of cough and worsening shortness of breath as well as pleuritic chest pain post radiation therapy.   He continues to have resting tachycardia and I elected to increase carvedilol to 6.25 mg twice daily.  2.  Essential  hypertension: Given the increase in the dose of carvedilol, I elected to discontinue hydrochlorothiazide and continue losartan.  I also stopped potassium supplement.  3.  Hyperlipidemia: Currently on simvastatin.    Disposition:   FU with me in 3 months.  Signed,  [name], MD  08/27/2018 4:25 PM    [facility]

## 2018-08-31 ENCOUNTER — Ambulatory Visit (INDEPENDENT_AMBULATORY_CARE_PROVIDER_SITE_OTHER): Payer: Commercial Managed Care - PPO

## 2018-08-31 DIAGNOSIS — R0600 Dyspnea, unspecified: Secondary | ICD-10-CM

## 2018-08-31 DIAGNOSIS — R0602 Shortness of breath: Secondary | ICD-10-CM

## 2018-08-31 MED ORDER — PERFLUTREN LIPID MICROSPHERE
1.0000 mL | INTRAVENOUS | Status: AC | PRN
  Administered 2018-08-31: 2 mL via INTRAVENOUS

## 2018-09-09 ENCOUNTER — Inpatient Hospital Stay (HOSPITAL_BASED_OUTPATIENT_CLINIC_OR_DEPARTMENT_OTHER): Payer: Commercial Managed Care - PPO | Admitting: Hematology and Oncology

## 2018-09-09 ENCOUNTER — Encounter: Payer: Self-pay | Admitting: Hematology and Oncology

## 2018-09-09 ENCOUNTER — Ambulatory Visit
Admission: RE | Admit: 2018-09-09 | Discharge: 2018-09-09 | Disposition: A | Discharge status: Home / Self Care | Payer: Commercial Managed Care - PPO | Attending: Hematology and Oncology | Admitting: Hematology and Oncology

## 2018-09-09 ENCOUNTER — Inpatient Hospital Stay: Payer: Commercial Managed Care - PPO

## 2018-09-09 ENCOUNTER — Ambulatory Visit
Admission: RE | Admit: 2018-09-09 | Discharge: 2018-09-09 | Disposition: A | Discharge status: Home / Self Care | Payer: Commercial Managed Care - PPO | Source: Ambulatory Visit | Attending: Hematology and Oncology | Admitting: Hematology and Oncology

## 2018-09-09 ENCOUNTER — Encounter: Payer: Self-pay | Admitting: Urgent Care

## 2018-09-09 ENCOUNTER — Telehealth: Payer: Self-pay

## 2018-09-09 VITALS — BP 111/78 | HR 89 | Temp 98.1°F | Resp 18 | Ht 72.0 in | Wt 251.8 lb

## 2018-09-09 DIAGNOSIS — R05 Cough: Secondary | ICD-10-CM

## 2018-09-09 DIAGNOSIS — R059 Cough, unspecified: Secondary | ICD-10-CM

## 2018-09-09 DIAGNOSIS — C8510 Unspecified B-cell lymphoma, unspecified site: Secondary | ICD-10-CM

## 2018-09-09 DIAGNOSIS — E876 Hypokalemia: Secondary | ICD-10-CM

## 2018-09-09 DIAGNOSIS — R000 Tachycardia, unspecified: Secondary | ICD-10-CM

## 2018-09-09 DIAGNOSIS — R0602 Shortness of breath: Secondary | ICD-10-CM | POA: Diagnosis not present

## 2018-09-09 DIAGNOSIS — C8332 Diffuse large B-cell lymphoma, intrathoracic lymph nodes: Secondary | ICD-10-CM | POA: Diagnosis not present

## 2018-09-09 LAB — BASIC METABOLIC PANEL
Anion gap: 6 (ref 5–15)
BUN: 15 mg/dL (ref 6–20)
CO2: 26 mmol/L (ref 22–32)
Calcium: 9.1 mg/dL (ref 8.9–10.3)
Chloride: 106 mmol/L (ref 98–111)
Creatinine, Ser: 0.95 mg/dL (ref 0.61–1.24)
GFR calc Af Amer: >60 mL/min (ref >60)
GFR calc non Af Amer: >60 mL/min (ref >60)
Glucose, Bld: 108 mg/dL — ABNORMAL HIGH (ref 70–99)
Potassium: 3.8 mmol/L (ref 3.5–5.1)
Sodium: 138 mmol/L (ref 135–145)

## 2018-09-09 MED ORDER — SODIUM CHLORIDE 0.9% FLUSH
10.0000 mL | INTRAVENOUS | Status: DC | PRN
  Administered 2018-09-09: 10 mL via INTRAVENOUS
  Filled 2018-09-09: qty 10

## 2018-09-09 MED ORDER — HEPARIN SOD (PORK) LOCK FLUSH 100 UNIT/ML IV SOLN
500.0000 [IU] | Freq: Once | INTRAVENOUS | Status: AC
  Administered 2018-09-09: 500 [IU] via INTRAVENOUS

## 2018-09-09 NOTE — Telephone Encounter (Signed)
-----   Message from Lequita Asal, MD sent at 09/09/2018  3:36 PM EST ----- Regarding: Please call patient  CXR looks good.  M ----- Message ----- From: Interface, Rad Results In Sent: 09/09/2018   1:55 PM EST To: Lequita Asal, MD

## 2018-09-09 NOTE — Telephone Encounter (Signed)
Left VM informing patient CXR were negative. Phone number left any questions should arise.

## 2018-09-09 NOTE — Progress Notes (Signed)
Patient c/o increase pain noted to right side of his neck (pain level today 2) x 3 days

## 2018-09-09 NOTE — Progress Notes (Signed)
[facility] day:  09/09/2018   Chief Complaint: Ronnie Mcintyre is a 53 y.o. male with a large B-cell lymphoma who is seen for a 2 week assessment and sick call visit.   HPI:  The patient was last seen in the medical oncology clinic on 08/26/2018.  At that time, patient was recovering from an acute respiratory illness. He had been sleeping in a recliner secondary to RIGHT pleuritic chest pain; resolved.  Productive cough persisted.  He denied any fevers or increased SOB.  He was back to working full time. Appetite had been decreased; weight was down 11 pounds. Exam revealed that patient was TACHcardic to the 110s. Lungs were clear. Labs were unremarkable.   During the interim,he has felt "pretty good".  He feels better some days than others.He states that Dr Fletcher Anon took him off HCTZ  last week.  He still has a cough when he lays down.  He props his head on pillows then falls asleep.  He has had a cough x 1 1/2 months.  Last Wednesday or Thursday (02/12 or 09/02/2018), he noted some tenderness above his port and into his neck which extended up to his ear.   Past Medical History:  Diagnosis Date  . Cancer ([place])   . Hypertension     Past Surgical History:  Procedure Laterality Date  . ANKLE SURGERY Right   . PORTACATH PLACEMENT Right 02/11/2018   Procedure: INSERTION PORT-A-CATH;  Surgeon: Jules Husbands, MD;  Location: [facility] ORS;  Service: General;  Laterality: Right;    Family History  Problem Relation Age of Onset  . Hypertension Mother   . Hypertension Father   . Multiple myeloma Father     Social History:  reports that he has never smoked. He has never used smokeless tobacco. He reports current alcohol use. He reports that he does not use drugs.  He drinks a beer 1-2 x/week.  He works for [employer].  He plans to return to work on 07/22/2018.  He lives in [place].  His is alone today.  Allergies: No Known Allergies  Current  Medications: Current Outpatient Medications  Medication Sig Dispense Refill  . aspirin 81 MG tablet Take 81 mg by mouth daily.     . carvedilol (COREG) 6.25 MG tablet Take 1 tablet (6.25 mg total) by mouth 2 (two) times daily with a meal. 180 tablet 3  . simvastatin (ZOCOR) 40 MG tablet Take 40 mg by mouth daily.    [name] acetaminophen (TYLENOL) 325 MG tablet Take 2 tablets (650 mg total) by mouth every 6 (six) hours as needed for mild pain (or Fever >/= 101).    [name] telmisartan (MICARDIS) 80 MG tablet Take by mouth.     No current facility-administered medications for this visit.     Review of Systems  Constitutional: Negative for chills, diaphoresis, fever, malaise/fatigue and weight loss (up 5 pounds).       Feels "pretty good".  Some days better than others.  HENT: Negative.  Negative for congestion, ear discharge, ear pain, nosebleeds, sinus pain, sore throat and tinnitus.   Eyes: Negative.  Negative for blurred vision, double vision and pain.  Respiratory: Positive for cough (chronic). Negative for hemoptysis, sputum production, shortness of breath and wheezing.   Cardiovascular: Negative.  Negative for chest pain, palpitations, orthopnea, leg swelling and PND.  Gastrointestinal: Negative.  Negative for abdominal pain, blood in stool, constipation, diarrhea, heartburn, melena, nausea and vomiting.  Genitourinary: Negative.  Negative for dysuria, frequency, hematuria and urgency.  Musculoskeletal: Positive for neck pain (tender above port and into neck). Negative for back pain, falls, joint pain and myalgias.  Skin: Negative.  Negative for itching and rash.  Neurological: Negative.  Negative for dizziness, tingling, tremors, sensory change, speech change, focal weakness, weakness and headaches.  Endo/Heme/Allergies: Negative.  Does not bruise/bleed easily.  Psychiatric/Behavioral: Negative.  Negative for depression and memory loss. The patient does not have insomnia.   All other systems  reviewed and are negative.  Performance status (ECOG): 1  Vital Signs BP 111/78 (BP Location: Left Arm, Patient Position: Sitting)   Pulse 89   Temp 98.1 F (36.7 C) (Tympanic)   Resp 18   Ht 6' (1.829 m)   Wt 251 lb 12.3 oz (114.2 kg)   SpO2 100%   BMI 34.15 kg/m   Physical Exam  Constitutional: He is oriented to person, place, and time and well-developed, well-nourished, and in no distress. No distress.  HENT:  Head: Normocephalic and atraumatic.  Mouth/Throat: Oropharynx is clear and moist and mucous membranes are normal. No oropharyngeal exudate.  Alopecia.  Eyes: Pupils are equal, round, and reactive to light. Conjunctivae and EOM are normal. No scleral icterus.  Blue eyes.  Neck: Normal range of motion. Neck supple. No JVD present.  Slight tenderness on SCM palpation.  Cardiovascular: Normal rate, regular rhythm and normal heart sounds. Exam reveals no gallop and no friction rub.  No murmur heard. Pulmonary/Chest: Effort normal and breath sounds normal. No respiratory distress. He has no wheezes. He has no rales.  Abdominal: Soft. Bowel sounds are normal. He exhibits no distension and no mass. There is no abdominal tenderness. There is no rebound and no guarding.  Musculoskeletal: Normal range of motion.        General: No tenderness or edema.  Lymphadenopathy:    He has no cervical adenopathy.    He has no axillary adenopathy.       Right: No supraclavicular adenopathy present.       Left: No supraclavicular adenopathy present.  Neurological: He is alert and oriented to person, place, and time. Gait normal.  Skin: Skin is warm and dry. No rash noted. He is not diaphoretic. No erythema. No pallor.  Psychiatric: Mood, affect and judgment normal.  Nursing note and vitals reviewed.   Infusion on 09/09/2018  Component Date Value Ref Range Status  . Sodium 09/09/2018 138  135 - 145 mmol/L Final  . Potassium 09/09/2018 3.8  3.5 - 5.1 mmol/L Final  . Chloride 09/09/2018  106  98 - 111 mmol/L Final  . CO2 09/09/2018 26  22 - 32 mmol/L Final  . Glucose, Bld 09/09/2018 108* 70 - 99 mg/dL Final  . BUN 09/09/2018 15  6 - 20 mg/dL Final  . Creatinine, Ser 09/09/2018 0.95  0.61 - 1.24 mg/dL Final  . Calcium 09/09/2018 9.1  8.9 - 10.3 mg/dL Final  . GFR calc non Af Amer 09/09/2018 >60  >60 mL/min Final  . GFR calc Af Amer 09/09/2018 >60  >60 mL/min Final  . Anion gap 09/09/2018 6  5 - 15 Final   Performed at [facility], [address] [place], [place] [address]    Assessment:  GERAMY LAMORTE is a 53 y.o. 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  [facility] pathology revealed necrotic tissue. [facility] consultation revealed neoplastic cells are positive for CD20, BCL-6,  MUM-1, and BCL-2. The neoplastic cells were negative for CD30, CD3, , CD10, c-MYC, EBV ISH, AE1/3, and PAX-8. Ki67 is 60-70%. 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 The absence of CD30 expression makes primary mediastinal large B-cell lymphoma less likely. FISH for MYC rearrangement was negative. IPI score, age adjusted IPI, are low (1) and NCCN IPI score is low-intermediate (2).  He presented with upper chest fullness with radiation to his neck and arm. He has had B symptoms (sweats and weight loss) worrisome for lymphoma. LDHis 273. Uric acid was normal.  Beta-HCG and AFP were normal on 01/16/2018.  Chest CT angiogramon 01/15/2018 revealed an 11.3 x 8.0 cm anterior mediastinal mass.  Abdomen and pelvic CT on 01/16/2018 revealed a 6.3 x 5.6 cm soft tissue mass or medium density fluid filling the urinary bladder.  There was a 1.5 cm mildly complex lower pole right renal cyst. There was a 1.9 cm indeterminate left adrenal mass.  There was a small pericardial effusion.  Renal ultrasound on 01/25/2018 revealed no evidence of bladder mass.  Finding on prior CT exam likely represented  excretion of residual contrast material from a CTA chest exam performed 1 day prior   PET scan on 01/26/2018 revealed an 11.1 cm intensely hypermetabolic and partially necrotic right anterior mediastinal mass, compatible with malignancy. There was diffuse marrow hypermetabolism, nonspecific, cannot exclude an infiltrative neoplastic marrow process. There was no focal skeletal hypermetabolism. There was no focal bone lesions on the CT images.  There was nonspecific mild heterogeneous prostatic hypermetabolism.  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    Bone marrow biopsy on 02/03/2018 revealed a slightly hypercellular bone marrow for age with trilineage hematopoiesis.  There was no monoclonal B-cell population or abnormal T-cell phenotype identified.  Cytogenetics were normal (46, XY).  Echo on 01/15/2018 revealed an EF of 55-60%.  Echo on 04/22/2018 revealed an EF of 50 to 55%. There was evidence of probable severe hypokinesis of the mid apical-anteroseptal, anterior, and apical myocardium.  Treadmill Myoview on 04/26/2018 revealed a low risk study with a small defect present in the apex location that was partially reversible and could represent small previous myocardial infarction. However, and apical thinning artifact could not be excluded.  Nuclear stress EF: 50%.  Echo on 05/17/2018 revealed an EF of 50-55%.  Echo on 08/31/2018 revealed an EF of 40-45%.  Hepatitis B, hepatitis C, and HIV testing was negative on 02/10/2018.  G6PD assay was normal.  He received 6 cycles of RCHOP chemotherapy (02/16/2018 - 06/10/2018) with Neulasta support.  He received radiation (3600 cGy in 18 fractions) from 07/27/2018 - 08/19/2018.  Chest, abdomen, and pelvic CT on 04/19/2018 revealed the anterior mediastinal mass had significantly reduced in size (10.1 x 6.9 cm to 6.8 by 4.5 cm).  The smaller left eccentric mediastinal lymph node measured 1.4 x 4.7  cm, previously 2.0 x 3.2 cm.  There was no appreciable adenopathy in the abdomen/pelvis.  There were bilateral hypodense renal lesions technically too small to characterize, including the right kidney lower pole lesion.   PET scan on 07/02/2018 revealed clear interval response to therapy with decrease in size and hypermetabolism of the anterior mediastinal mass (6.9 x 11.1 cm to 2 x 2.6 cm; SUV 14.3 to 5.3).  There was a similar appearance of the diffuse marrow hypermetabolism, nonspecific.  There was a 4.2 cm ascending thoracic aortic diameter.  There was  a stable left adrenal adenoma.  He was diagnosed with T6-T7 herpes zoster on 03/19/2018.  He was treated with valacyclovir.  He is on prophylactic valacyclovir.    Symptomatically, he has a chronic cough.  He notes tenderness above his port and into his neck.  Exam reveals no adenopathy or hepatosplenomegaly.  SCM is slightly tender.  Plan: 1. Labs today:  CBC with diff, CMP, LDH, uric acid. 2. Diffuse large B cell lymphoma Patient s/p chemotherapy and radiation. Patient recovering from treatment. Patient denies B symptoms. Exam reveals no adenopathy. Discuss plan for PET scan on 11/18/2018. 3. Weight loss Patient has gained weight during the interim. No evidence of excess fluid. Continue to monitor. 4. Cardiac wall motion abnormalities Echo on 08/31/2018 revealed an EF of 40-45%. There was mildly increased left ventricular wall thickness and left ventricular diffuse hypokinesis. Patient followed closely by Dr Fletcher Anon.  5. Intermittent tachycardia  Check TSH. 6. Chronic cough  Etiology unclear.  CXR (PA and lateral) today. 7. Hypokalemia Potassium 3.8. Discuss plan to continue potassium supplementation. 8. Neck tenderness  Exam reveals tenderness in the SCM without palpable adenopathy.  Port-a-cath site is unremarkable.  No evidence of infection.  Continue to monitor. 9.   RTC in 2 weeks for labs (BMP) and weight check. 10.    RTC after PET scan for MD assessment, labs (CBC with diff, CMP, LDH, uric acid), and review of PET scan.  Addendum:  CXR today revealed no active cardiopulmonary disease.  I discussed the assessment and treatment plan with the patient.  The patient was provided an opportunity to ask questions and all were answered.  The patient agreed with the plan and demonstrated an understanding of the instructions.  The patient was advised to call back or seek an in person evaluation if the symptoms worsen or if the condition fails to improve as anticipated.    Nolon Stalls, MD 09/09/2018, 9:11 AM

## 2018-09-20 ENCOUNTER — Ambulatory Visit
Admission: RE | Admit: 2018-09-20 | Discharge: 2018-09-20 | Disposition: A | Discharge status: Home / Self Care | Payer: Commercial Managed Care - PPO | Source: Ambulatory Visit | Attending: Radiation Oncology | Admitting: Radiation Oncology

## 2018-09-20 ENCOUNTER — Other Ambulatory Visit: Payer: Self-pay

## 2018-09-20 ENCOUNTER — Encounter: Payer: Self-pay | Admitting: Radiation Oncology

## 2018-09-20 ENCOUNTER — Inpatient Hospital Stay: Payer: Commercial Managed Care - PPO | Attending: Hematology and Oncology

## 2018-09-20 VITALS — BP 130/87 | HR 44 | Temp 96.1°F | Resp 16 | Wt 249.5 lb

## 2018-09-20 DIAGNOSIS — Z923 Personal history of irradiation: Secondary | ICD-10-CM | POA: Insufficient documentation

## 2018-09-20 DIAGNOSIS — C8510 Unspecified B-cell lymphoma, unspecified site: Secondary | ICD-10-CM | POA: Diagnosis not present

## 2018-09-20 NOTE — Progress Notes (Signed)
Radiation Oncology Follow up Note  Name: Ronnie Mcintyre   Date:   09/20/2018 MRN:  [id number] DOB: 09-May-1966    This 53 y.o. male presents to the clinic today for one-month follow-up status post involved field radiation therapy to his chest for stage IB large B-cell lymphoma of the anterior mediastinum.  REFERRING PROVIDER: [name], MD  HPI: patient is a 53 year old male now out 1 month having completed involved field radiation therapy to his chest for stage IB large B-cell lymphoma status post 6 cycles of R CHOP. Seen today in routine follow up is doing well specifically denies hemoptysis chest tightness. Has a mild nonproductive cough. No fever chills or night sweats..  COMPLICATIONS OF TREATMENT: none  FOLLOW UP COMPLIANCE: keeps appointments   PHYSICAL EXAM:  BP 130/87 (BP Location: Left Arm, Patient Position: Sitting)   Pulse (!) 44   Temp (!) 96.1 F (35.6 C) (Tympanic)   Resp 16   Wt 249 lb 7.2 oz (113.2 kg)   BMI 33.83 kg/m  Well-developed well-nourished patient in NAD. HEENT reveals PERLA, EOMI, discs not visualized.  Oral cavity is clear. No oral mucosal lesions are identified. Neck is clear without evidence of cervical or supraclavicular adenopathy. Lungs are clear to A&P. Cardiac examination is essentially unremarkable with regular rate and rhythm without murmur rub or thrill. Abdomen is benign with no organomegaly or masses noted. Motor sensory and DTR levels are equal and symmetric in the upper and lower extremities. Cranial nerves II through XII are grossly intact. Proprioception is intact. No peripheral adenopathy or edema is identified. No motor or sensory levels are noted. Crude visual fields are within normal range.  RADIOLOGY RESULTS: no current films for review PET CT scan has been ordered for April  PLAN: present time patient is doing well with very limited side effects from his treatment. I'm please was overall progress. I've asked to see him back in about  3-4 months after his PET/CT scan has been performed. Patient knows to call sooner with any concerns. He continues close follow-up care with medical oncology.  I would like to take this opportunity to thank you for allowing me to participate in the care of your patient.Noreene Filbert, MD

## 2018-09-22 DIAGNOSIS — E782 Mixed hyperlipidemia: Secondary | ICD-10-CM | POA: Diagnosis not present

## 2018-09-22 DIAGNOSIS — R7302 Impaired glucose tolerance (oral): Secondary | ICD-10-CM | POA: Diagnosis not present

## 2018-09-22 DIAGNOSIS — I10 Essential (primary) hypertension: Secondary | ICD-10-CM | POA: Diagnosis not present

## 2018-09-30 DIAGNOSIS — Z760 Encounter for issue of repeat prescription: Secondary | ICD-10-CM | POA: Diagnosis not present

## 2018-11-16 ENCOUNTER — Other Ambulatory Visit: Payer: Self-pay

## 2018-11-16 ENCOUNTER — Encounter: Payer: Self-pay | Admitting: Emergency Medicine

## 2018-11-16 ENCOUNTER — Ambulatory Visit
Admission: EM | Admit: 2018-11-16 | Discharge: 2018-11-16 | Disposition: A | Discharge status: Home / Self Care | Payer: Commercial Managed Care - PPO | Attending: Family Medicine | Admitting: Family Medicine

## 2018-11-16 DIAGNOSIS — R51 Headache: Secondary | ICD-10-CM

## 2018-11-16 DIAGNOSIS — H15101 Unspecified episcleritis, right eye: Secondary | ICD-10-CM

## 2018-11-16 DIAGNOSIS — L03811 Cellulitis of head [any part, except face]: Secondary | ICD-10-CM

## 2018-11-16 DIAGNOSIS — R519 Headache, unspecified: Secondary | ICD-10-CM

## 2018-11-16 MED ORDER — NAPROXEN 500 MG PO TABS: 500.0000 mg | ORAL_TABLET | Freq: Two times a day (BID) | ORAL | 0 refills | Status: DC | PRN

## 2018-11-16 MED ORDER — DOXYCYCLINE HYCLATE 100 MG PO CAPS: 100.0000 mg | ORAL_CAPSULE | Freq: Two times a day (BID) | ORAL | 0 refills | Status: DC

## 2018-11-16 NOTE — ED Provider Notes (Signed)
[facility]    CSN: [id number] Arrival date & time: 11/16/18  3086  History   Chief Complaint Chief Complaint  Patient presents with  . Headache   HPI  53 year old male presents with headache and right eye redness.  Patient reports that approximately 2 weeks ago he woke up and had a raised, red area on his forehead.  He states that he felt that he was bitten by something.  The area subsequently drained and scabbed over.  He states that he has now been having headaches daily.  Mild in severity. Headache is primarily over this region.  Radiates slightly to the right.  He has been using Tylenol with improvement.  Additionally, patient reports a 2-day history of right eye redness.  Nonpainful.  No visual disturbance.  No reports of foreign body.  Patient does note that this has happened to him before secondary to allergies.  No other associated symptoms.  No other complaints or concerns at this time.  PMH, Surgical Hx, Family Hx, Social History reviewed and updated as below.  PMH: Patient Active Problem List   Diagnosis Date Noted  . Regional wall motion abnormality of heart 04/25/2018  . Goals of care, counseling/discussion 04/25/2018  . Neuropathy due to chemotherapeutic drug ([place]) 04/11/2018  . Encounter for antineoplastic immunotherapy 04/11/2018  . Encounter for antineoplastic chemotherapy 03/09/2018  . Hypokalemia 03/09/2018  . Chest discomfort 02/15/2018  . Lymphoma malignant, 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[place])   . Renal cyst, right 01/22/2018  . Large B-cell lymphoma ([place]) 01/15/2018  . Asymptomatic proteinuria 11/16/2014  . Elevated fasting glucose 11/16/2014  . Hyperlipidemia 11/16/2014  . Hypertension, essential 11/16/2014  . Seasonal allergic rhinitis 11/16/2014   Past Surgical History:  Procedure Laterality Date  . ANKLE SURGERY Right   . PORTACATH PLACEMENT Right 02/11/2018   Procedure: INSERTION PORT-A-CATH;  Surgeon: Jules Husbands, MD;  Location: [facility] ORS;  Service:  General;  Laterality: Right;   Home Medications    Prior to Admission medications   Medication Sig Start Date End Date Taking? Authorizing Provider  acetaminophen (TYLENOL) 325 MG tablet Take 2 tablets (650 mg total) by mouth every 6 (six) hours as needed for mild pain (or Fever >/= 101). 01/16/18  Yes Dustin Flock, MD  aspirin 81 MG tablet Take 81 mg by mouth daily.    Yes [provider]  carvedilol (COREG) 6.25 MG tablet Take 1 tablet (6.25 mg total) by mouth 2 (two) times daily with a meal. 08/27/18 11/25/18 Yes Wellington Hampshire, MD  simvastatin (ZOCOR) 40 MG tablet Take 40 mg by mouth daily.   Yes [provider]  telmisartan (MICARDIS) 80 MG tablet Take by mouth. 11/08/18  Yes [provider]  doxycycline (VIBRAMYCIN) 100 MG capsule Take 1 capsule (100 mg total) by mouth 2 (two) times daily. 11/16/18   Coral Spikes, DO  naproxen (NAPROSYN) 500 MG tablet Take 1 tablet (500 mg total) by mouth 2 (two) times daily as needed for moderate pain or headache. 11/16/18   Coral Spikes, DO   Family History Family History  Problem Relation Age of Onset  . Hypertension Mother   . Hypertension Father   . Multiple myeloma Father    Social History Social History   Tobacco Use  . Smoking status: Never Smoker  . Smokeless tobacco: Never Used  Substance Use Topics  . Alcohol use: Yes    Frequency: Never    Comment: rarely  . Drug use: No   Allergies  Patient has no known allergies.  Review of Systems Review of Systems  Constitutional: Negative for fever.  Neurological: Positive for headaches.   Physical Exam Triage Vital Signs ED Triage Vitals  Enc Vitals Group     BP 11/16/18 0836 (!) 141/95     Pulse Rate 11/16/18 0836 88     Resp 11/16/18 0836 16     Temp 11/16/18 0836 98.3 F (36.8 C)     Temp Source 11/16/18 0836 Oral     SpO2 11/16/18 0836 99 %     Weight 11/16/18 0833 246 lb (111.6 kg)     Height 11/16/18 0833 6' (1.829 m)     Head  Circumference --      Peak Flow --      Pain Score 11/16/18 0833 2     Pain Loc --      Pain Edu? --      Excl. in [place]? --    Updated Vital Signs BP (!) 141/95 (BP Location: Left Arm)   Pulse 88   Temp 98.3 F (36.8 C) (Oral)   Resp 16   Ht 6' (1.829 m)   Wt 111.6 kg   SpO2 99%   BMI 33.36 kg/m   Visual Acuity Right Eye Distance:   Left Eye Distance:   Bilateral Distance:    Right Eye Near:   Left Eye Near:    Bilateral Near:     Physical Exam Vitals signs and nursing note reviewed.  Constitutional:      General: He is not in acute distress.    Appearance: Normal appearance.  HENT:     Head:     Comments: Forehead with a raised area with mild surrounding erythema; central eschar noted.    Nose: Nose normal.  Eyes:     Comments: Injection noted laterally (Right eye). Appears consistent with episcleritis.  Cardiovascular:     Rate and Rhythm: Normal rate and regular rhythm.  Pulmonary:     Effort: Pulmonary effort is normal. No respiratory distress.  Neurological:     Mental Status: He is alert.  Psychiatric:        Mood and Affect: Mood normal.        Behavior: Behavior normal.    UC Treatments / Results  Labs (all labs ordered are listed, but only abnormal results are displayed) Labs Reviewed - No data to display  EKG None  Radiology No results found.  Procedures Procedures (including critical care time)  Medications Ordered in UC Medications - No data to display  Initial Impression / Assessment and Plan / UC Course  I have reviewed the triage vital signs and the nursing notes.  Pertinent labs & imaging results that were available during my care of the patient were reviewed by me and considered in my medical decision making (see chart for details).    53 year old male presents with headache which is likely secondary to cellulitis.  Placing on doxycycline.  Naproxen for headache.  Additionally, patient appears to have episcleritis.  NSAIDs should  help with this as well.  Final Clinical Impressions(s) / UC Diagnoses   Final diagnoses:  Acute nonintractable headache, unspecified headache type  Cellulitis of head except face  Episcleritis of right eye     Discharge Instructions     Medication as directed.  Call with concerns.  Take care  Dr. Lacinda Axon   ED Prescriptions    Medication Sig Dispense Auth. Provider   naproxen (NAPROSYN) 500 MG tablet Take 1  tablet (500 mg total) by mouth 2 (two) times daily as needed for moderate pain or headache. 30 tablet Soren Lazarz G, DO   doxycycline (VIBRAMYCIN) 100 MG capsule Take 1 capsule (100 mg total) by mouth 2 (two) times daily. 14 capsule Coral Spikes, DO     Controlled Substance Prescriptions [place] Controlled Substance Registry consulted? Not Applicable   Coral Spikes, [place] 11/16/18 0737

## 2018-11-16 NOTE — Discharge Instructions (Signed)
Medication as directed. ? ?Call with concerns. ? ?Take care ? ?Dr. Salbador Fiveash ?

## 2018-11-16 NOTE — ED Triage Notes (Signed)
Pt c/o headache and eye redness. About 2 weeks ago pt woke up and something had bitten him on the forehead. The abscess is healing and scabbed over but he has been having headaches on the right side of his head daily, worse at night and his right eye is red.

## 2018-11-18 ENCOUNTER — Inpatient Hospital Stay: Payer: Commercial Managed Care - PPO

## 2018-11-18 ENCOUNTER — Encounter: Payer: Self-pay | Admitting: Hematology and Oncology

## 2018-11-18 ENCOUNTER — Ambulatory Visit
Admission: RE | Admit: 2018-11-18 | Discharge: 2018-11-18 | Disposition: A | Discharge status: Home / Self Care | Payer: Commercial Managed Care - PPO | Source: Ambulatory Visit | Attending: Urgent Care | Admitting: Urgent Care

## 2018-11-18 ENCOUNTER — Other Ambulatory Visit: Payer: Self-pay

## 2018-11-18 ENCOUNTER — Inpatient Hospital Stay: Payer: Commercial Managed Care - PPO | Attending: Hematology and Oncology | Admitting: Hematology and Oncology

## 2018-11-18 VITALS — BP 133/89 | HR 83 | Temp 98.3°F | Resp 18 | Ht 72.0 in | Wt 249.3 lb

## 2018-11-18 DIAGNOSIS — I10 Essential (primary) hypertension: Secondary | ICD-10-CM | POA: Diagnosis not present

## 2018-11-18 DIAGNOSIS — C8332 Diffuse large B-cell lymphoma, intrathoracic lymph nodes: Secondary | ICD-10-CM | POA: Insufficient documentation

## 2018-11-18 DIAGNOSIS — C8510 Unspecified B-cell lymphoma, unspecified site: Secondary | ICD-10-CM | POA: Diagnosis not present

## 2018-11-18 DIAGNOSIS — R000 Tachycardia, unspecified: Secondary | ICD-10-CM

## 2018-11-18 DIAGNOSIS — R222 Localized swelling, mass and lump, trunk: Secondary | ICD-10-CM | POA: Insufficient documentation

## 2018-11-18 DIAGNOSIS — C8580 Other specified types of non-Hodgkin lymphoma, unspecified site: Secondary | ICD-10-CM

## 2018-11-18 DIAGNOSIS — R05 Cough: Secondary | ICD-10-CM | POA: Diagnosis not present

## 2018-11-18 DIAGNOSIS — J189 Pneumonia, unspecified organism: Secondary | ICD-10-CM | POA: Diagnosis not present

## 2018-11-18 DIAGNOSIS — E876 Hypokalemia: Secondary | ICD-10-CM

## 2018-11-18 LAB — COMPREHENSIVE METABOLIC PANEL
ALT: 28 U/L (ref 0–44)
AST: 21 U/L (ref 15–41)
Albumin: 4.1 g/dL (ref 3.5–5.0)
Alkaline Phosphatase: 69 U/L (ref 38–126)
Anion gap: 7 (ref 5–15)
BUN: 21 mg/dL — ABNORMAL HIGH (ref 6–20)
CO2: 24 mmol/L (ref 22–32)
Calcium: 8.9 mg/dL (ref 8.9–10.3)
Chloride: 105 mmol/L (ref 98–111)
Creatinine, Ser: 0.99 mg/dL (ref 0.61–1.24)
GFR calc Af Amer: >60 mL/min (ref >60)
GFR calc non Af Amer: >60 mL/min (ref >60)
Glucose, Bld: 85 mg/dL (ref 70–99)
Potassium: 3.9 mmol/L (ref 3.5–5.1)
Sodium: 136 mmol/L (ref 135–145)
Total Bilirubin: 1.5 mg/dL — ABNORMAL HIGH (ref 0.3–1.2)
Total Protein: 7 g/dL (ref 6.5–8.1)

## 2018-11-18 LAB — CBC WITH DIFFERENTIAL/PLATELET
Abs Immature Granulocytes: 0.04 10*3/uL (ref 0.00–0.07)
Basophils Absolute: 0 10*3/uL (ref 0.0–0.1)
Basophils Relative: 0 %
Eosinophils Absolute: 0.2 10*3/uL (ref 0.0–0.5)
Eosinophils Relative: 3 %
HCT: 42.7 % (ref 39.0–52.0)
Hemoglobin: 14.8 g/dL (ref 13.0–17.0)
Immature Granulocytes: 0 %
Lymphocytes Relative: 11 %
Lymphs Abs: 1 10*3/uL (ref 0.7–4.0)
MCH: 29.7 pg (ref 26.0–34.0)
MCHC: 34.7 g/dL (ref 30.0–36.0)
MCV: 85.7 fL (ref 80.0–100.0)
Monocytes Absolute: 0.7 10*3/uL (ref 0.1–1.0)
Monocytes Relative: 8 %
Neutro Abs: 7 10*3/uL (ref 1.7–7.7)
Neutrophils Relative %: 78 %
Platelets: 226 10*3/uL (ref 150–400)
RBC: 4.98 MIL/uL (ref 4.22–5.81)
RDW: 16.2 % — ABNORMAL HIGH (ref 11.5–15.5)
WBC: 9 10*3/uL (ref 4.0–10.5)
nRBC: 0 % (ref 0.0–0.2)

## 2018-11-18 LAB — GLUCOSE, CAPILLARY: Glucose-Capillary: 97 mg/dL (ref 70–99)

## 2018-11-18 LAB — LACTATE DEHYDROGENASE: LDH: 162 U/L (ref 98–192)

## 2018-11-18 LAB — URIC ACID: Uric Acid, Serum: 7.3 mg/dL (ref 3.7–8.6)

## 2018-11-18 MED ORDER — HEPARIN SOD (PORK) LOCK FLUSH 100 UNIT/ML IV SOLN
500.0000 [IU] | Freq: Once | INTRAVENOUS | Status: AC
  Administered 2018-11-18: 500 [IU] via INTRAVENOUS

## 2018-11-18 MED ORDER — SODIUM CHLORIDE 0.9% FLUSH
10.0000 mL | INTRAVENOUS | Status: DC | PRN
  Administered 2018-11-18: 10 mL via INTRAVENOUS
  Filled 2018-11-18: qty 10

## 2018-11-18 MED ORDER — FLUDEOXYGLUCOSE F - 18 (FDG) INJECTION
12.9300 | Freq: Once | INTRAVENOUS | Status: AC | PRN
  Administered 2018-11-18: 09:00:00 12.93 via INTRAVENOUS

## 2018-11-18 NOTE — Progress Notes (Signed)
[facility]  [address], [address] [place], [place] [address] Phone: [phone]  Fax: [phone]   Clinic Day:  11/18/2018  Referring physician: [name], MD  Chief Complaint: Ronnie Mcintyre is a 53 y.o. male with a large B-cell lymphoma who is seen for a 2 month assessment and follow-up of recent CXR   HPI:  The patient was last seen in the medical oncology clinic on 09/09/2018.  At that time, he denied any B symptoms.  Exam revealed no adenopathy or hepatosplenomegaly.  He presented to [facility] on 11/16/2018 with daily headaches lasting 2 weeks after an insect bit his forehead as well as redness in his right eye.  He was started on doxycycline for cellulitis, Naproxen for headache, and NSAIDs for episcleritis.   PET scan on 11/18/2018 revealed continued decrease in metabolic activity of anterior mediastinal mass (Deauville 3), no evidence of lymphoma progression, and new focus of metabolic airspace disease in the medial right lower lobe favored focus of pulmonary infection (pneumonia).  During the interim, he has been doing well.  He reports two weeks ago, he had two bumps on his forehead, which he attributes to working under his house and two bees that possibly stung him. When the swollen lesion opened 2 days later, the fluid was clear. He has had intermittent headaches. He reports it looks better today than yesterday. 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 His right eye has had redness and discharge that is improving.   He has a productive cough and pain on his right side since undergoing radiation. It worsens at night when he lays down, but is improving. Tender lymph nodes in his neck have resolved. He walks about 2 miles every day without shortness of breath.    Past Medical History:  Diagnosis Date  . Cancer ([place])   . Hypertension     Past Surgical History:  Procedure Laterality Date  . ANKLE SURGERY Right   . PORTACATH PLACEMENT Right  02/11/2018   Procedure: INSERTION PORT-A-CATH;  Surgeon: Jules Husbands, MD;  Location: [facility] ORS;  Service: General;  Laterality: Right;    Family History  Problem Relation Age of Onset  . Hypertension Mother   . Hypertension Father   . Multiple myeloma Father     Social History:  reports that he has never smoked. He has never used smokeless tobacco. He reports current alcohol use. He reports that he does not use drugs. He drinks beer 1-2x/week.  He is walking 2 miles/day.  He works for [employer].  He lives in [place]. The patient is alone today.  Allergies: No Known Allergies  Current Medications: Current Outpatient Medications  Medication Sig Dispense Refill  . aspirin 81 MG tablet Take 81 mg by mouth daily.     . carvedilol (COREG) 6.25 MG tablet Take 1 tablet (6.25 mg total) by mouth 2 (two) times daily with a meal. 180 tablet 3  . doxycycline (VIBRAMYCIN) 100 MG capsule Take 1 capsule (100 mg total) by mouth 2 (two) times daily. 14 capsule 0  . naproxen (NAPROSYN) 500 MG tablet Take 1 tablet (500 mg total) by mouth 2 (two) times daily as needed for moderate pain or headache. 30 tablet 0  . simvastatin (ZOCOR) 40 MG tablet Take 40 mg by mouth daily.    [name] telmisartan (MICARDIS) 80 MG tablet Take by mouth.    [name] acetaminophen (TYLENOL) 325 MG tablet Take 2 tablets (650 mg total) by  mouth every 6 (six) hours as needed for mild pain (or Fever >/= 101). (Patient not taking: Reported on 11/18/2018)     No current facility-administered medications for this visit.     Review of Systems  Constitutional: Negative.  Negative for chills, fever, malaise/fatigue and weight loss (down 2 pounds).  HENT: Negative.  Negative for congestion, ear pain, hearing loss, nosebleeds, sinus pain, sore throat and tinnitus.   Eyes: Negative.  Negative for blurred vision, double vision, photophobia and pain.  Respiratory: Positive for cough (since radiation). Negative for hemoptysis, sputum production  and wheezing. Shortness of breath: resolved.   Cardiovascular: Negative for chest pain (resolved), palpitations, orthopnea, leg swelling and PND.  Gastrointestinal: Negative.  Negative for abdominal pain, blood in stool, constipation, diarrhea, heartburn, nausea and vomiting.  Genitourinary: Negative.  Negative for dysuria, frequency, hematuria and urgency.  Musculoskeletal: Negative.  Negative for back pain, joint pain, myalgias and neck pain.  Skin: Negative for itching and rash.       Forehead lesion s/p insect bite.  Neurological: Positive for headaches. Negative for dizziness, tingling, sensory change, speech change, focal weakness and weakness.  Endo/Heme/Allergies: Negative.  Does not bruise/bleed easily.  Psychiatric/Behavioral: Negative.  Negative for memory loss. The patient is not nervous/anxious and does not have insomnia.    Performance status (ECOG): 1  Blood pressure 133/89, pulse 83, temperature 98.3 F (36.8 C), temperature source Tympanic, resp. rate 18, height 6' (1.829 m), weight 249 lb 5.4 oz (113.1 kg).   Physical Exam  Constitutional: He is oriented to person, place, and time. He appears well-developed and well-nourished. No distress.  HENT:  Head: Normocephalic and atraumatic.  Mouth/Throat: Oropharynx is clear and moist. No oropharyngeal exudate.  Alopecia. Wearing mask.  Eyes: Pupils are equal, round, and reactive to light. EOM are normal. Right eye exhibits chemosis and discharge.  Blue eyes.  Neck: Normal range of motion. Neck supple. No JVD present.  Cardiovascular: Normal rate, regular rhythm and normal heart sounds. Exam reveals no gallop and no friction rub.  No murmur heard. Pulmonary/Chest: Effort normal and breath sounds normal. No respiratory distress. He has no wheezes. He has no rales.  Abdominal: Soft. Bowel sounds are normal. He exhibits no distension and no mass. There is no abdominal tenderness. There is no rebound and no guarding.   Musculoskeletal: Normal range of motion.        General: No tenderness or edema.  Lymphadenopathy:    He has no cervical adenopathy.    He has no axillary adenopathy.       Right: No supraclavicular adenopathy present.       Left: No supraclavicular adenopathy present.  Neurological: He is alert and oriented to person, place, and time. He has normal reflexes.  Skin: Skin is warm and dry. He is not diaphoretic.  Psychiatric: He has a normal mood and affect. His behavior is normal. Judgment and thought content normal.  Nursing note and vitals reviewed.   Hospital Outpatient Visit on 11/18/2018  Component Date Value Ref Range Status  . Glucose-Capillary 11/18/2018 97  70 - 99 mg/dL Final    Assessment:  Ronnie Mcintyre is a 53 y.o. 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  [facility] pathology revealed necrotic tissue. [facility] consultation revealed neoplastic cells are positive for CD20, BCL-6, MUM-1, and BCL-2. The neoplastic cells were negative for CD30, CD3, , CD10, c-MYC, EBV ISH, AE1/3, and PAX-8. Ki67 is 60-70%. These findings  were consistent with a large B-cell lymphoma with an activated B-cell phenotype. The absence of CD30 expression makes primary mediastinal large B-cell lymphoma less likely. FISH for MYC rearrangement was negative. IPI score, age adjusted IPI, are low (1) and NCCN IPI score is low-intermediate (2).  He presented with upper chest fullness with radiation to his neck and arm. He has had B symptoms (sweats and weight loss) worrisome for lymphoma. LDHis 273. Uric acid was normal.  Beta-HCG and AFP were normal on 01/16/2018.  Chest CT angiogramon 01/15/2018 revealed an 11.3 x 8.0 cm anterior mediastinal mass.  Abdomen and pelvic CT on 01/16/2018 revealeda 6.3 x 5.6 cm soft tissue mass or medium density fluid filling the urinary bladder.There was a1.5 cm mildly complex lower pole right renal cyst.There was a1.9 cm  indeterminate left adrenal mass.There was a small pericardial effusion.  Renal ultrasound on 01/25/2018 revealed no evidence of bladder mass.  Finding on prior CT exam likely represented excretion of residual contrast material from a CTA chest exam performed 1 day prior   PET scan on 01/26/2018 revealed an 11.1 cm intensely hypermetabolic and partially necrotic right anterior mediastinal mass, compatible with malignancy. There was diffuse marrow hypermetabolism, nonspecific, cannot exclude an infiltrative neoplastic marrow process. There was no focal skeletal hypermetabolism. There was no focal bone lesions on the CT images.  There was nonspecific mild heterogeneous prostatic hypermetabolism.  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    Bone marrow biopsy on 02/03/2018 revealed a slightly hypercellular bone marrow for age with trilineage hematopoiesis.  There was no monoclonal B-cell population or abnormal T-cell phenotype identified.  Cytogenetics were normal (46, XY).  Echo on 01/15/2018 revealed an EF of 55-60%.  Echo on 04/22/2018 revealed an EF of 50 to 55%. There was evidence of probable severe hypokinesis of the mid apical-anteroseptal, anterior, and apical myocardium.  Treadmill Myoview on 04/26/2018 revealed a low risk study with a small defect present in the apex location that was partially reversible and could represent small previous myocardial infarction. However, and apical thinning artifact could not be excluded.  Nuclear stress EF: 50%.  Echo on 05/17/2018 revealed an EF of 50-55%.  Hepatitis B, hepatitis C, and HIV testing was negative on 02/10/2018. G6PD assay was normal.  He received 6 cycles of RCHOP chemotherapy (02/16/2018 - 06/10/2018) with Neulasta support.  He received radiation (3600 cGy in 18 fractions) from 07/27/2018 - 08/19/2018.  Chest, abdomen, and pelvic CT on 04/19/2018 revealed the anterior  mediastinal mass had significantly reduced in size (10.1 x 6.9 cm to 6.8 by 4.5 cm).  The smaller left eccentric mediastinal lymph node measured 1.4 x 4.7 cm, previously 2.0 x 3.2 cm.  There was no appreciable adenopathy in the abdomen/pelvis.  There were bilateral hypodense renal lesions technically too small to characterize, including the right kidney lower pole lesion.   PET scan on 07/02/2018 revealed clear interval response to therapy with decrease in size and hypermetabolism of the anterior mediastinal mass (6.9 x 11.1 cm to 2 x 2.6 cm; SUV 14.3 to 5.3).  There was a similar appearance of the diffuse marrow hypermetabolism, nonspecific.  There was a 4.2 cm ascending thoracic aortic diameter.  There was a stable left adrenal adenoma.  He was diagnosed with T6-T7 herpes zoster on 03/19/2018.  He was treated with valacyclovir. He is on prophylactic valacyclovir.    PET scan on 11/18/2018 revealed continued decrease in metabolic activity of anterior mediastinal  mass (Deauville 3), no evidence of lymphoma progression on skull base to thigh, and new focus of metabolic airspace disease in the medial right lower lobe favored focus of pulmonary infection (pneumonia).  Symptomatically, he has had a cough since radiation.  Exam reveals no adenopathy or hepatosplenomegaly.  He has some sequelae after being bit by insects (?bees).  Plan: 1. Labs today:  CBC with diff, CMP, LDH, uric acid. 2. Diffuse large B cell lymphoma Clinically doing well. He notes a chronic cough since completion of radiation. PET scan today reveals continued improvement. Discuss plan for follow-up: every 3 months in year 1, every 4 months in year 2, every 6 months in year 3-5, then annually. 3.   Cardiac wall motion abnormalities Echo on 05/17/2018 revealed an EF of 50-55%. Mild hypokinesis of the mid-apicalanteroseptal myocardium.  Patient follows with Dr Fletcher Anon, cardiology.  4.   Chronic cough  Temporally associated with  radiation.  PET scan reveals medial right lobe changes.  No fever.  Patient s/p recent course of doxycycline.  Patient to contact clinic if any change in cough or fever. 5.   Elevated bilirubin             Bilirubin 1.5.              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 6.   Port flush today.  7.   RTC in 8 weeks for MD assessment and labs (CBC with diff, CMP, LDH, uric acid).  I discussed the assessment and treatment plan with the patient.  The patient was provided an opportunity to ask questions and all were answered.  The patient agreed with the plan and demonstrated an understanding of the instructions.  The patient was advised to call back if the symptoms worsen or if the condition fails to improve as anticipated.  I provided 20 minutes of face-to-face time during this this encounter and > 50% was spent counseling as documented under my assessment and plan.    Nolon Stalls, MD, PhD    11/18/2018, 3:19 PM  I, Molly Dorshimer, am acting as Education administrator for [employer]. Mike Gip, MD, PhD.  I, Melissa C. Mike Gip, MD, have reviewed the above documentation for accuracy and completeness, and I agree with the above.

## 2018-11-18 NOTE — Progress Notes (Signed)
No new changes noted today 

## 2018-11-22 ENCOUNTER — Other Ambulatory Visit: Payer: Commercial Managed Care - PPO

## 2018-11-22 ENCOUNTER — Inpatient Hospital Stay: Payer: Commercial Managed Care - PPO

## 2018-11-23 ENCOUNTER — Other Ambulatory Visit: Payer: Commercial Managed Care - PPO

## 2018-11-23 ENCOUNTER — Ambulatory Visit: Payer: Commercial Managed Care - PPO | Admitting: Hematology and Oncology

## 2018-11-23 IMAGING — DX DG CHEST 1V
1 series · 1 of 1 positions shown · non-contrast
Comparison: 01/19/2018

CLINICAL DATA: Status post Port-A-Cath placement.

EXAM:
CHEST  1 VIEW

[chest ap]
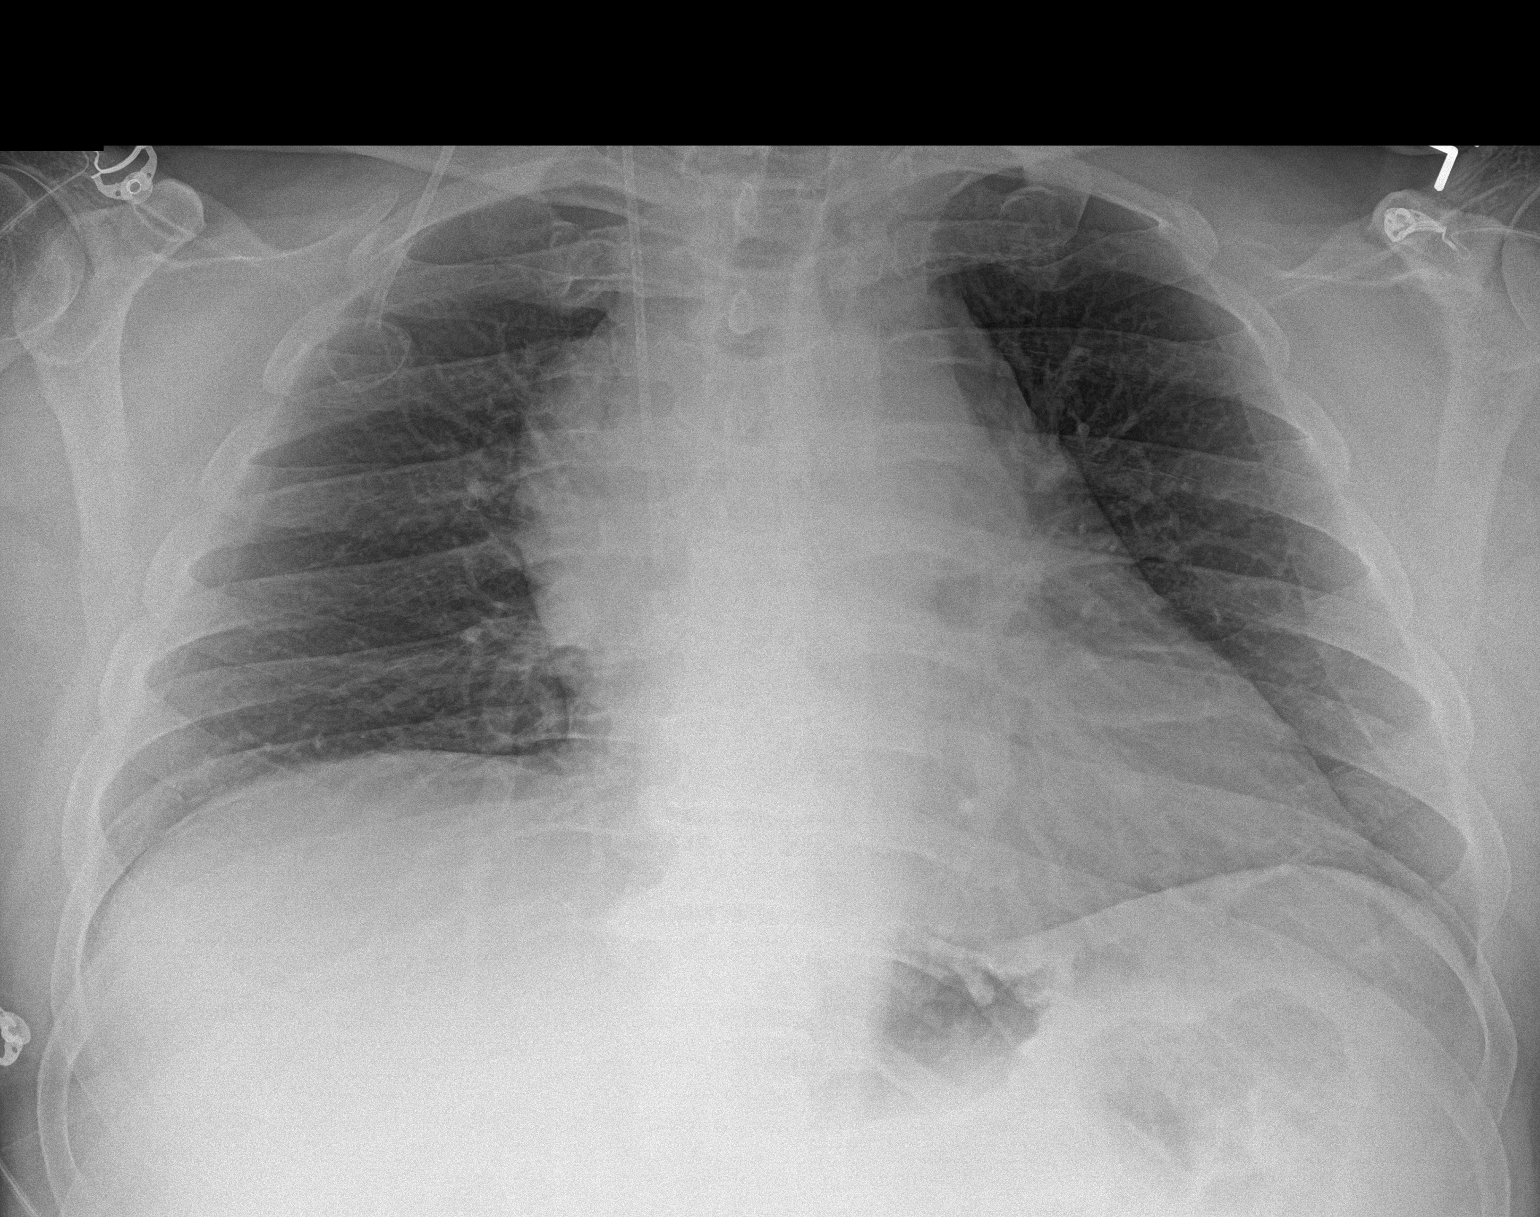

[1 of 1 positions shown; findings below may reference images not displayed]

FINDINGS: Right anterior chest wall, internal jugular, Port-A-Cath has its tip
projecting in the lower superior vena cava.

No pneumothorax.

Lungs are clear.
IMPRESSION: Well-positioned right anterior chest wall Port-A-Cath, tip
projecting in lower superior vena cava. No pneumothorax.

## 2018-11-24 ENCOUNTER — Telehealth: Payer: Self-pay

## 2018-11-24 ENCOUNTER — Encounter: Payer: Self-pay | Admitting: Hematology and Oncology

## 2018-11-24 NOTE — Telephone Encounter (Signed)

## 2018-11-25 ENCOUNTER — Other Ambulatory Visit: Payer: Self-pay

## 2018-11-25 ENCOUNTER — Telehealth (INDEPENDENT_AMBULATORY_CARE_PROVIDER_SITE_OTHER): Payer: Commercial Managed Care - PPO | Admitting: Cardiovascular Disease

## 2018-11-25 ENCOUNTER — Encounter: Payer: Self-pay | Admitting: Cardiovascular Disease

## 2018-11-25 VITALS — BP 121/79 | HR 89 | Ht 72.0 in | Wt 246.1 lb

## 2018-11-25 DIAGNOSIS — I427 Cardiomyopathy due to drug and external agent: Secondary | ICD-10-CM | POA: Diagnosis not present

## 2018-11-25 DIAGNOSIS — I10 Essential (primary) hypertension: Secondary | ICD-10-CM

## 2018-11-25 NOTE — Progress Notes (Signed)
Virtual Visit via Video Note   This visit type was conducted due to national recommendations for restrictions regarding the COVID-19 Pandemic (e.g. social distancing) in an effort to limit this patient's exposure and mitigate transmission in our community.  Due to his co-morbid illnesses, this patient is at least at moderate risk for complications without adequate follow up.  This format is felt to be most appropriate for this patient at this time.  All issues noted in this document were discussed and addressed.  A limited physical exam was performed with this format.  Please refer to the patient's chart for his consent to telehealth for [facility].   Date:  11/25/2018   ID:  Ronnie Mcintyre, DOB 1966/04/14, MRN [id number]  Patient Location: Home Provider Location: Office  PCP:  [name], MD  Cardiologist:  [name], MD  Electrophysiologist:  None   Evaluation Performed:  Follow-Up Visit  Chief Complaint: Feels better overall  History of Present Illness:    Ronnie Mcintyre is a 53 y.o. 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 The patient has known history of obesity, hypertension and hyperlipidemia.  He has no prior cardiac history.  He is not a smoker or diabetic.  He has no family history of coronary artery disease. 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  He had an echocardiogram done in June 2019 before chemotherapy which showed an EF of 55 to 60% with no wall motion abnormalities and mildly dilated aortic root at 40 mmHg.  He had a repeat echocardiogram after third cycle of chemotherapy which showed an EF of 50 to 55% with probable severe hypokinesis of the mid to distal anteroseptal, anterior and apical myocardium.   Treadmill Myoview in October was done.He was able to exercise for 4 minutes and 45 seconds.  Perfusion was suboptimal overall but there was a small  apical defect likely due to to apical thinning although a small infarct could not be excluded.  Nonetheless, it was a low risk study with EF of 50%.  He received extensive radiation therapy.    This was complicated by persistent coughing and shortness of breath that gradually improved.   During last visit, I stopped hydrochlorothiazide and increase the dose of carvedilol.  He felt significantly better since then.  We repeated his echocardiogram which showed an EF of 40 to 45%. He feels well at the present time and he has been able to walk 3 miles daily for exercise.  He is getting his strength back.  No leg edema.   The patient does not have symptoms concerning for COVID-19 infection (fever, chills, cough, or new shortness of breath).    Past Medical History:  Diagnosis Date  . Cancer ([place])   . Hypertension    Past Surgical History:  Procedure Laterality Date  . ANKLE SURGERY Right   . PORTACATH PLACEMENT Right 02/11/2018   Procedure: INSERTION PORT-A-CATH;  Surgeon: Jules Husbands, MD;  Location: [facility] ORS;  Service: General;  Laterality: Right;     Current Meds  Medication Sig  . acetaminophen (TYLENOL) 325 MG tablet Take 2 tablets (650 mg total) by mouth every 6 (six) hours as needed for mild pain (or Fever >/= 101).  [name] aspirin 81 MG tablet Take 81 mg by mouth daily.   . carvedilol (COREG) 6.25 MG tablet Take 1 tablet (6.25 mg total) by mouth 2 (two) times daily with  a meal.  . simvastatin (ZOCOR) 40 MG tablet Take 40 mg by mouth daily.  [name] telmisartan (MICARDIS) 80 MG tablet Take by mouth.     Allergies:   Patient has no known allergies.   Social History   Tobacco Use  . Smoking status: Never Smoker  . Smokeless tobacco: Never Used  Substance Use Topics  . Alcohol use: Yes    Frequency: Never    Comment: rarely  . Drug use: No     Family Hx: The patient's family history includes Hypertension in his father and mother; Multiple myeloma in his father.  ROS:   Please see  the history of present illness.     All other systems reviewed and are negative.   Prior CV studies:   The following studies were reviewed today:  I reviewed results of most recent echocardiogram with him  Labs/Other Tests and Data Reviewed:    EKG:  No ECG reviewed.  Recent Labs: 07/06/2018: Magnesium 1.8 08/26/2018: TSH 0.864 11/18/2018: ALT 28; BUN 21; Creatinine, Ser 0.99; Hemoglobin 14.8; Platelets 226; Potassium 3.9; Sodium 136   Recent Lipid Panel No results found for: CHOL, TRIG, HDL, CHOLHDL, LDLCALC, LDLDIRECT  Wt Readings from Last 3 Encounters:  11/25/18 246 lb 1 oz (111.6 kg)  11/18/18 249 lb 5.4 oz (113.1 kg)  11/16/18 246 lb (111.6 kg)     Objective:    Vital Signs:  BP 121/79   Pulse 89   Ht 6' (1.829 m)   Wt 246 lb 1 oz (111.6 kg)   BMI 33.37 kg/m    VITAL SIGNS:  reviewed GEN:  no acute distress EYES:  sclerae anicteric, EOMI - Extraocular Movements Intact RESPIRATORY:  normal respiratory effort, symmetric expansion CARDIOVASCULAR:  no peripheral edema SKIN:  no rash, lesions or ulcers. MUSCULOSKELETAL:  no obvious deformities. NEURO:  alert and oriented x 3, no obvious focal deficit PSYCH:  normal affect  ASSESSMENT & PLAN:    1.  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   Continue treatment with carvedilol and telmisartan.  He is clinically doing very well at the present time and will make no changes.  The plan is to repeat his echocardiogram in 6 to 12 months to ensure stability.  If ejection fraction goes below 40%, I plan to switch him to [facility] and possibly adding spironolactone.  2.  Essential hypertension: Blood pressure is well controlled.  3.  Hyperlipidemia: Currently on simvastatin.   COVID-19 Education: The signs and symptoms of COVID-19 were discussed with the patient and how to seek care for testing (follow up with PCP or arrange E-visit).  The  importance of social distancing was discussed today.  Time:   Today, I have spent 15 minutes with the patient with telehealth technology discussing the above problems.     Medication Adjustments/Labs and Tests Ordered: Current medicines are reviewed at length with the patient today.  Concerns regarding medicines are outlined above.   Tests Ordered: No orders of the defined types were placed in this encounter.   Medication Changes: No orders of the defined types were placed in this encounter.   Disposition:  Follow up in 3 month(s)  Signed, [name], MD  11/25/2018 9:20 AM    [place]

## 2018-11-25 NOTE — Patient Instructions (Signed)
Medication Instructions:  Continue same medications If you need a refill on your cardiac medications before your next appointment, please call your pharmacy.   Lab work: None If you have labs (blood work) drawn today and your tests are completely normal, you will receive your results only by: . MyChart Message (if you have MyChart) OR . A paper copy in the mail If you have any lab test that is abnormal or we need to change your treatment, we will call you to review the results.  Testing/Procedures: None  Follow-Up: At [facility], you and your health needs are our priority.  As part of our continuing mission to provide you with exceptional heart care, we have created designated Provider Care Teams.  These Care Teams include your primary Cardiologist (physician) and Advanced Practice Providers (APPs -  Physician Assistants and Nurse Practitioners) who all work together to provide you with the care you need, when you need it. You will need a follow up appointment in 3 months.  Please call our office 2 months in advance to schedule this appointment.  You may see Graesyn Schreifels, MD or one of the following Advanced Practice Providers on your designated Care Team:   Christopher Berge, NP Ryan Dunn, PA-C . Jacquelyn Visser, PA-C     

## 2018-11-26 DIAGNOSIS — H15091 Other scleritis, right eye: Secondary | ICD-10-CM | POA: Diagnosis not present

## 2018-12-06 ENCOUNTER — Encounter: Payer: Self-pay | Admitting: Hematology and Oncology

## 2018-12-31 ENCOUNTER — Encounter: Payer: Self-pay | Admitting: *Deleted

## 2019-01-10 ENCOUNTER — Other Ambulatory Visit: Payer: Self-pay

## 2019-01-10 ENCOUNTER — Encounter: Payer: Self-pay | Admitting: Hematology and Oncology

## 2019-01-10 ENCOUNTER — Telehealth: Payer: Self-pay

## 2019-01-10 DIAGNOSIS — Z20828 Contact with and (suspected) exposure to other viral communicable diseases: Secondary | ICD-10-CM

## 2019-01-10 DIAGNOSIS — Z20822 Contact with and (suspected) exposure to covid-19: Secondary | ICD-10-CM

## 2019-01-10 NOTE — Telephone Encounter (Signed)
Spoke with patient regarding possible exposure. Patient scheduled for drive thru testing 0/25/85 at 0900. Patient verbalizes understanding and denies any questions.

## 2019-01-11 ENCOUNTER — Other Ambulatory Visit: Payer: Commercial Managed Care - PPO

## 2019-01-12 ENCOUNTER — Other Ambulatory Visit: Payer: Self-pay

## 2019-01-13 ENCOUNTER — Ambulatory Visit: Payer: Commercial Managed Care - PPO | Admitting: Hematology and Oncology

## 2019-01-13 ENCOUNTER — Other Ambulatory Visit: Payer: Commercial Managed Care - PPO

## 2019-01-20 ENCOUNTER — Telehealth: Payer: Self-pay

## 2019-01-20 NOTE — Telephone Encounter (Signed)
Contacted patient to inform him COVID results were back and resulted as negative. Patient verbalizes understanding and denies any further questions.

## 2019-01-24 NOTE — Progress Notes (Signed)
[facility]  [address] [place], [place] [address] Phone: [phone]  Fax: [phone]   Clinic Day:  01/26/2019  Referring physician: [name], MD  Chief Complaint: Ronnie Mcintyre is a 53 y.o. male with stage IB large B-cell lymphoma who is seen fora 2 month assessment.  HPI:  The patient was last seen in the medical oncology clinic on 11/18/2018. At that time, he described a cough since radiation.  Exam revealed no adenopathy or hepatosplenomegaly.  Exam revealed sequelae after being bit by insects (?bees). He was on doxycycline.   He contacted the clinic after finishing his course of doxycycline with improvement in his insect bite but no improvement in his eye redness on 11/24/2018.  He was referred to his eye doctor, who put him on a steroid for 2-3 weeks and the redness resolved.   He was seen in cardiology via telemedicine on 11/25/2018 by Dr. Fletcher Anon.  Echo on 08/31/2018 revealed an ejection fraction of 40-45%. 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   COVID-19 testing after a possible exposure at work on 01/10/2019 was negative.   During the interim, he is doing "pretty good." He has a scar on his forehead from the insect bite. He denies any chest pain, shortness of breath, difficulty sleeping, edema, fevers, sweats, weight loss, lumps or bumps. He continues to have a cough he has had since radiation, worse at night that wakes him up briefly.   He is active exercising and walking at least an hour per day. He has been working a lot more during the pandemic.    Past Medical History:  Diagnosis Date   Cancer ([place])    Hypertension     Past Surgical History:  Procedure Laterality Date   ANKLE SURGERY Right    PORTACATH PLACEMENT Right 02/11/2018   Procedure: INSERTION PORT-A-CATH;  Surgeon: Jules Husbands, MD;  Location: [facility] ORS;  Service: General;  Laterality: Right;    Family  History  Problem Relation Age of Onset   Hypertension Mother    Hypertension Father    Multiple myeloma Father     Social History:  reports that he has never smoked. He has never used smokeless tobacco. He reports current alcohol use. He reports that he does not use drugs.  He drinks beer 1-2x/week.  He is walking for an hour, about 2 miles/day. He works for [employer], about 9-10 hours/day 6 days a week. He lives in [place]. The patient is alone today.  Allergies: No Known Allergies  Current Medications: Current Outpatient Medications  Medication Sig Dispense Refill   aspirin 81 MG tablet Take 81 mg by mouth daily.      carvedilol (COREG) 6.25 MG tablet Take 1 tablet (6.25 mg total) by mouth 2 (two) times daily with a meal. 180 tablet 3   simvastatin (ZOCOR) 40 MG tablet Take 40 mg by mouth daily.     telmisartan (MICARDIS) 80 MG tablet Take by mouth.     acetaminophen (TYLENOL) 325 MG tablet Take 2 tablets (650 mg total) by mouth every 6 (six) hours as needed for mild pain (or Fever >/= 101). (Patient not taking: Reported on 01/26/2019)     No current facility-administered medications for this visit.     Review of Systems  Constitutional: Negative.  Negative for chills, diaphoresis, fever, malaise/fatigue and weight loss (Up 1lb).       Doing "pretty good."   HENT: Negative.  Negative  for congestion, ear pain, hearing loss, nosebleeds, sinus pain, sore throat and tinnitus.   Eyes: Positive for redness (right). Negative for blurred vision, double vision, photophobia and pain.  Respiratory: Positive for cough (since radiation). Negative for hemoptysis, sputum production, shortness of breath and wheezing.   Cardiovascular: Negative for chest pain, palpitations, orthopnea, leg swelling and PND.  Gastrointestinal: Negative.  Negative for abdominal pain, blood in stool, constipation, diarrhea, heartburn, nausea and vomiting.  Genitourinary: Negative.  Negative for dysuria,  frequency, hematuria and urgency.  Musculoskeletal: Negative.  Negative for back pain, joint pain, myalgias and neck pain.  Skin: Negative for itching and rash.       Forehead scar s/p insect bite.  Neurological: Negative for dizziness, tingling, sensory change, speech change, focal weakness, weakness and headaches.  Endo/Heme/Allergies: Negative.  Does not bruise/bleed easily.  Psychiatric/Behavioral: Negative.  Negative for memory loss. The patient is not nervous/anxious and does not have insomnia.   All other systems reviewed and are negative.  Performance status (ECOG): 1  Vitals Blood pressure 110/85, pulse 83, temperature 98.5 F (36.9 C), resp. rate 18, height 6' (1.829 m), weight 250 lb 10.6 oz (113.7 kg), SpO2 99 %.   Physical Exam  Constitutional: He is oriented to person, place, and time. He appears well-developed and well-nourished. No distress.  HENT:  Head: Normocephalic and atraumatic.  Mouth/Throat: Oropharynx is clear and moist. No oropharyngeal exudate.  Alopecia. Wearing mask.  Eyes: Pupils are equal, round, and reactive to light. EOM are normal. Right eye exhibits chemosis. Right eye exhibits no discharge.  Blue eyes.  Right eye injected laterally.  Neck: Normal range of motion. Neck supple. No JVD present.  Cardiovascular: Normal rate, regular rhythm and normal heart sounds. Exam reveals no gallop and no friction rub.  No murmur heard. Pulmonary/Chest: Effort normal. No respiratory distress. He has no wheezes. He has no rales.  Slight decreased breath sounds RLL.  Abdominal: Soft. Bowel sounds are normal. He exhibits no distension and no mass. There is no abdominal tenderness. There is no rebound and no guarding.  Musculoskeletal: Normal range of motion.        General: No tenderness or edema.  Lymphadenopathy:    He has no cervical adenopathy.    He has no axillary adenopathy.       Right: No inguinal and no supraclavicular adenopathy present.       Left: No  inguinal and no supraclavicular adenopathy present.  Neurological: He is alert and oriented to person, place, and time. He has normal reflexes.  Skin: Skin is warm and dry. He is not diaphoretic. No erythema.  Psychiatric: He has a normal mood and affect. His behavior is normal. Judgment and thought content normal.  Nursing note and vitals reviewed.   Infusion on 01/26/2019  Component Date Value Ref Range Status   Uric Acid, Serum 01/26/2019 6.9  3.7 - 8.6 mg/dL Final   Performed at [facility], [address], [place], [place] [address]   LDH 01/26/2019 141  98 - 192 U/L Final   Performed at [facility], [address], [place], [place] [address]   Sodium 01/26/2019 137  135 - 145 mmol/L Final   Potassium 01/26/2019 4.0  3.5 - 5.1 mmol/L Final   Chloride 01/26/2019 103  98 - 111 mmol/L Final   CO2 01/26/2019 24  22 - 32 mmol/L Final   Glucose, Bld 01/26/2019 93  70 - 99 mg/dL Final   BUN 01/26/2019 21*  6 - 20 mg/dL Final   Creatinine, Ser 01/26/2019 1.19  0.61 - 1.24 mg/dL Final   Calcium 01/26/2019 9.1  8.9 - 10.3 mg/dL Final   Total Protein 01/26/2019 6.9  6.5 - 8.1 g/dL Final   Albumin 01/26/2019 4.0  3.5 - 5.0 g/dL Final   AST 01/26/2019 22  15 - 41 U/L Final   ALT 01/26/2019 28  0 - 44 U/L Final   Alkaline Phosphatase 01/26/2019 61  38 - 126 U/L Final   Total Bilirubin 01/26/2019 1.5* 0.3 - 1.2 mg/dL Final   GFR calc non Af Amer 01/26/2019 >60  >60 mL/min Final   GFR calc Af Amer 01/26/2019 >60  >60 mL/min Final   Anion gap 01/26/2019 10  5 - 15 Final   Performed at [facility], [address], [place], [place] [address]   WBC 01/26/2019 8.2  4.0 - 10.5 K/uL Final   RBC 01/26/2019 5.19  4.22 - 5.81 MIL/uL Final   Hemoglobin 01/26/2019 15.1  13.0 - 17.0 g/dL Final   HCT 01/26/2019 45.0  39.0 - 52.0 % Final   MCV 01/26/2019 86.7  80.0 - 100.0 fL Final   MCH 01/26/2019 29.1  26.0 - 34.0 pg Final   MCHC  01/26/2019 33.6  30.0 - 36.0 g/dL Final   RDW 01/26/2019 14.9  11.5 - 15.5 % Final   Platelets 01/26/2019 206  150 - 400 K/uL Final   nRBC 01/26/2019 0.0  0.0 - 0.2 % Final   Neutrophils Relative % 01/26/2019 80  % Final   Neutro Abs 01/26/2019 6.5  1.7 - 7.7 K/uL Final   Lymphocytes Relative 01/26/2019 10  % Final   Lymphs Abs 01/26/2019 0.9  0.7 - 4.0 K/uL Final   Monocytes Relative 01/26/2019 8  % Final   Monocytes Absolute 01/26/2019 0.7  0.1 - 1.0 K/uL Final   Eosinophils Relative 01/26/2019 2  % Final   Eosinophils Absolute 01/26/2019 0.2  0.0 - 0.5 K/uL Final   Basophils Relative 01/26/2019 0  % Final   Basophils Absolute 01/26/2019 0.0  0.0 - 0.1 K/uL Final   Immature Granulocytes 01/26/2019 0  % Final   Abs Immature Granulocytes 01/26/2019 0.03  0.00 - 0.07 K/uL Final   Performed at [facility], [address] [place], [place] [address]    Assessment:  Ronnie Mcintyre is a 53 y.o. 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 [facility]. [facility] consultationrevealed neoplastic cells are positive for CD20, BCL-6, MUM-1, and BCL-2. The neoplastic cells were negative for CD30, CD3, , CD10, c-MYC, EBV ISH, AE1/3, and PAX-8. Ki67 is 60-70%. 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 The absence of CD30 expression makes primary mediastinal large B-cell lymphoma less likely. FISH for MYC rearrangement was negative.IPI score, age adjusted IPI, are low (1) and NCCN IPI scoreis low-intermediate (2).  He presented with upper chest fullness with radiation to his neck and arm. He has had B symptoms (sweats and weight loss) worrisome for lymphoma. LDHis 273. Uric acid was normal. Beta-HCG and AFP were normal on 01/16/2018.  Chest CT angiogramon 01/15/2018 revealed an 11.3 x 8.0 cm anterior mediastinal mass.  Abdomen and pelvic CT  on 01/16/2018 revealeda 6.3 x 5.6 cm soft tissue mass or medium density fluid filling the urinary bladder.There was a1.5 cm mildly complex lower pole right renal cyst.There was a1.9 cm indeterminate left  adrenal mass.There was a small pericardial effusion. Renal ultrasoundon 01/25/2018 revealed no evidence of bladder mass. Finding on prior CT exam likely represented excretion of residual contrast material from a CTA chest exam performed 1 day prior   PET scanon 01/26/2018 revealed an 11.1 cm intensely hypermetabolic and partially necrotic right anterior mediastinal mass, compatible with malignancy. There was diffuse marrow hypermetabolism, nonspecific, cannot exclude an infiltrative neoplastic marrow process. There was no focal skeletal hypermetabolism. There was no focal bone lesions on the CT images. There was nonspecific mild heterogeneous prostatic hypermetabolism. 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   Bone marrow biopsyon 02/03/2018 revealed a slightly hypercellular bone marrow for age with trilineage hematopoiesis. There was no monoclonal B-cell population or abnormal T-cell phenotype identified. Cytogenetics were normal (46, XY).  Echoon 01/15/2018 revealed an EF of 55-60%. Echoon 04/22/2018 revealed an EF of 50 to 55%. There was evidence of probablesevere hypokinesisof the mid apical-anteroseptal, anterior, and apical myocardium. Treadmill Myoviewon 04/26/2018 revealed a low risk study with a small defect present in the apex location that was partially reversible and could represent small previous myocardial infarction. However, and apical thinning artifact could not be excluded. Nuclear stress EF: 50%. Echoon 05/17/2018 revealed an EF of 50-55%.  Echo on 08/31/2018 revealed an ejection fraction of 40-45%.  Hepatitis B, hepatitis C, and HIV testing was negative on 02/10/2018. G6PD assay was  normal.  He received 6 cycles of RCHOPchemotherapy (02/16/2018 - 06/10/2018) with Neulasta support. He received radiation(3600 cGy in 18 fractions) from 07/27/2018 - 08/19/2018.  Chest, abdomen, and pelvic CTon 04/19/2018 revealed the anterior mediastinal mass had significantly reduced in size (10.1 x 6.9 cm to 6.8 by 4.5 cm). The smaller left eccentric mediastinal lymph node measured 1.4 x 4.7 cm, previously 2.0 x 3.2 cm. There was no appreciable adenopathy in the abdomen/pelvis. There were bilateral hypodense renal lesions technically too small to characterize, including the right kidney lower pole lesion.   PET scanon 07/02/2018 revealed clear interval response to therapy with decrease in size and hypermetabolism of the anterior mediastinal mass (6.9 x 11.1 cm to 2 x 2.6 cm; SUV 14.3 to 5.3). There was a similar appearance of the diffuse marrow hypermetabolism, nonspecific. There was a 4.2 cm ascending thoracic aortic diameter. There was a stable left adrenal adenoma.  He was diagnosed with T6-T7herpes zosteron 03/19/2018. He was treated with valacyclovir. He is on prophylactic valacyclovir.   PET scan on 11/18/2018 revealed continued decrease in metabolic activity of anterior mediastinal mass ([place] 3), no evidence of lymphoma progression on skull base to thigh, and new focus of metabolic airspace disease in the medial right lower lobe favored focus of pulmonary infection (pneumonia).  Symptomatically, he denies any fevers, sweats or weight loss.  He denies any symptoms of CHF.  Exam reveals slight decreased breath sounds in the right lower lobe.  Plan: 1.   Labs today: CBC with diff, CMP, LDH, uric acid. 2.   Diffuse large B cell lymphoma Clinically, he continues to do well.   He has a chronic cough since completion of radiation.   Anticipate follow-up scans at 6 months Discuss plan for follow-up: every 3 months in year 1, every 4 months in year 2, every 6 months in  year 3-5, then annually. 3.   Cardiac wall motion abnormalities Echo on 08/31/2017 revealed an EF of 40-45%. Patient followed by Dr. Fletcher Anon of cardiology.  4.   Chronic cough  Cough is temporally associated with radiation.    He is status post a course of doxycycline             CXR (PA and latera) today. 5.   Elevated bilirubin Bilirubin 1.5 (direct 0.2).  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 6.   Port flush today.  7.   Port flush every 12 weeks. 8.   Labs prior to CT scan: CBC with diff, CMP, LDH, uric acid. 9.   Chest, abdomen, and pelvic CT on 05/20/2019. 10.   RTC after CT scans for MD assessment and review of labs and imaging.  Addendum:  CXR revealed no active cardiopulmonary disease.  I discussed the assessment and treatment plan with the patient.  The patient was provided an opportunity to ask questions and all were answered. The patient agreed with the plan and demonstrated an understanding of the instructions. The patient was advised to call back if the symptoms worsen or if the condition fails to improve as anticipated.  I provided 15 minutes of face-to-face time during this this encounter and > 50% was spent counseling as documented under my assessment and plan.    Lequita Asal, MD, PhD    01/26/2019, 9:32 AM  I, Cloyde Reams Dorshimer, am acting as Education administrator for [employer]. Mike Gip, MD, PhD.  I, Melissa C. Mike Gip, MD, have reviewed the above documentation for accuracy and completeness, and I agree with the above.

## 2019-01-25 ENCOUNTER — Other Ambulatory Visit: Payer: Self-pay

## 2019-01-26 ENCOUNTER — Inpatient Hospital Stay: Payer: Commercial Managed Care - PPO | Admitting: Hematology and Oncology

## 2019-01-26 ENCOUNTER — Inpatient Hospital Stay: Payer: Commercial Managed Care - PPO | Attending: Hematology and Oncology

## 2019-01-26 ENCOUNTER — Ambulatory Visit
Admission: RE | Admit: 2019-01-26 | Discharge: 2019-01-26 | Disposition: A | Discharge status: Home / Self Care | Payer: Commercial Managed Care - PPO | Attending: Hematology and Oncology | Admitting: Hematology and Oncology

## 2019-01-26 ENCOUNTER — Ambulatory Visit
Admission: RE | Admit: 2019-01-26 | Discharge: 2019-01-26 | Disposition: A | Discharge status: Home / Self Care | Payer: Commercial Managed Care - PPO | Source: Ambulatory Visit | Attending: Hematology and Oncology | Admitting: Hematology and Oncology

## 2019-01-26 ENCOUNTER — Other Ambulatory Visit: Payer: Self-pay

## 2019-01-26 ENCOUNTER — Encounter: Payer: Self-pay | Admitting: Hematology and Oncology

## 2019-01-26 VITALS — BP 110/85 | HR 83 | Temp 98.5°F | Resp 18 | Ht 72.0 in | Wt 250.7 lb

## 2019-01-26 DIAGNOSIS — C8510 Unspecified B-cell lymphoma, unspecified site: Secondary | ICD-10-CM

## 2019-01-26 DIAGNOSIS — R931 Abnormal findings on diagnostic imaging of heart and coronary circulation: Secondary | ICD-10-CM | POA: Diagnosis not present

## 2019-01-26 DIAGNOSIS — I10 Essential (primary) hypertension: Secondary | ICD-10-CM | POA: Insufficient documentation

## 2019-01-26 DIAGNOSIS — C8580 Other specified types of non-Hodgkin lymphoma, unspecified site: Secondary | ICD-10-CM

## 2019-01-26 DIAGNOSIS — C8332 Diffuse large B-cell lymphoma, intrathoracic lymph nodes: Secondary | ICD-10-CM | POA: Insufficient documentation

## 2019-01-26 DIAGNOSIS — R17 Unspecified jaundice: Secondary | ICD-10-CM | POA: Diagnosis not present

## 2019-01-26 LAB — CBC WITH DIFFERENTIAL/PLATELET
Abs Immature Granulocytes: 0.03 10*3/uL (ref 0.00–0.07)
Basophils Absolute: 0 10*3/uL (ref 0.0–0.1)
Basophils Relative: 0 %
Eosinophils Absolute: 0.2 10*3/uL (ref 0.0–0.5)
Eosinophils Relative: 2 %
HCT: 45 % (ref 39.0–52.0)
Hemoglobin: 15.1 g/dL (ref 13.0–17.0)
Immature Granulocytes: 0 %
Lymphocytes Relative: 10 %
Lymphs Abs: 0.9 10*3/uL (ref 0.7–4.0)
MCH: 29.1 pg (ref 26.0–34.0)
MCHC: 33.6 g/dL (ref 30.0–36.0)
MCV: 86.7 fL (ref 80.0–100.0)
Monocytes Absolute: 0.7 10*3/uL (ref 0.1–1.0)
Monocytes Relative: 8 %
Neutro Abs: 6.5 10*3/uL (ref 1.7–7.7)
Neutrophils Relative %: 80 %
Platelets: 206 10*3/uL (ref 150–400)
RBC: 5.19 MIL/uL (ref 4.22–5.81)
RDW: 14.9 % (ref 11.5–15.5)
WBC: 8.2 10*3/uL (ref 4.0–10.5)
nRBC: 0 % (ref 0.0–0.2)

## 2019-01-26 LAB — LACTATE DEHYDROGENASE: LDH: 141 U/L (ref 98–192)

## 2019-01-26 LAB — COMPREHENSIVE METABOLIC PANEL
ALT: 28 U/L (ref 0–44)
AST: 22 U/L (ref 15–41)
Albumin: 4 g/dL (ref 3.5–5.0)
Alkaline Phosphatase: 61 U/L (ref 38–126)
Anion gap: 10 (ref 5–15)
BUN: 21 mg/dL — ABNORMAL HIGH (ref 6–20)
CO2: 24 mmol/L (ref 22–32)
Calcium: 9.1 mg/dL (ref 8.9–10.3)
Chloride: 103 mmol/L (ref 98–111)
Creatinine, Ser: 1.19 mg/dL (ref 0.61–1.24)
GFR calc Af Amer: >60 mL/min (ref >60)
GFR calc non Af Amer: >60 mL/min (ref >60)
Glucose, Bld: 93 mg/dL (ref 70–99)
Potassium: 4 mmol/L (ref 3.5–5.1)
Sodium: 137 mmol/L (ref 135–145)
Total Bilirubin: 1.5 mg/dL — ABNORMAL HIGH (ref 0.3–1.2)
Total Protein: 6.9 g/dL (ref 6.5–8.1)

## 2019-01-26 LAB — URIC ACID: Uric Acid, Serum: 6.9 mg/dL (ref 3.7–8.6)

## 2019-01-26 LAB — BILIRUBIN, DIRECT: Bilirubin, Direct: 0.2 mg/dL (ref 0.0–0.2)

## 2019-01-26 MED ORDER — HEPARIN SOD (PORK) LOCK FLUSH 100 UNIT/ML IV SOLN
500.0000 [IU] | Freq: Once | INTRAVENOUS | Status: AC
  Administered 2019-01-26: 500 [IU] via INTRAVENOUS

## 2019-01-26 MED ORDER — SODIUM CHLORIDE 0.9% FLUSH
10.0000 mL | INTRAVENOUS | Status: DC | PRN
  Administered 2019-01-26: 10 mL via INTRAVENOUS
  Filled 2019-01-26: qty 10

## 2019-01-26 NOTE — Progress Notes (Signed)
No new changes noted today 

## 2019-01-27 ENCOUNTER — Telehealth: Payer: Self-pay

## 2019-01-27 NOTE — Telephone Encounter (Signed)
Contacted patient and informed him CXR was negative. Patient verbalizes understanding and denies any further questions or concerns.

## 2019-01-27 NOTE — Telephone Encounter (Signed)
-----   Message from Lequita Asal, MD sent at 01/26/2019  4:33 PM EDT -----  Please call patient.  CXR is good !  M ----- Message ----- From: Interface, Rad Results In Sent: 01/26/2019   3:18 PM EDT To: Lequita Asal, MD

## 2019-01-29 IMAGING — CT CT CHEST W/ CM
2 of 5 series · 12 of 36 positions shown, 15 images · IV contrast (iopamidol)
Comparison: Multiple exams, including PET-CT of 01/26/2018 and CT
exams from 01/15/2018 and 01/16/2018

CLINICAL DATA: Restaging non-Hodgkin's lymphoma diagnosed in January 2018. Ongoing chemotherapy.

EXAM:
CT CHEST, ABDOMEN, AND PELVIS WITH CONTRAST
TECHNIQUE: Multidetector CT imaging of the chest, abdomen and pelvis was
performed following the standard protocol during bolus
administration of intravenous contrast.
CONTRAST:  100mL X35NDC-199 IOPAMIDOL (X35NDC-199) INJECTION 61%

[Series 2: cap with · axial · 0.84mm/px · z∈[-885,-335]mm · 9 of 138 slices shown, 12 images]
[im 14/138  mediastinal]
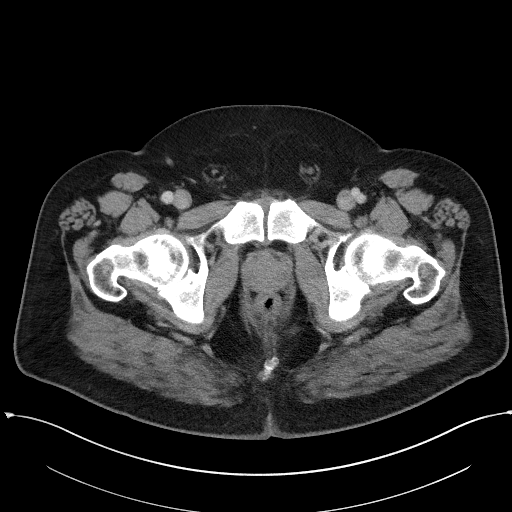
[im 14/138  lung]
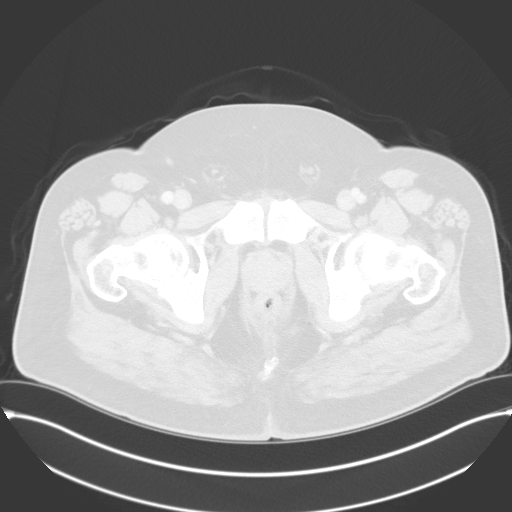
[im 28/138  lung]
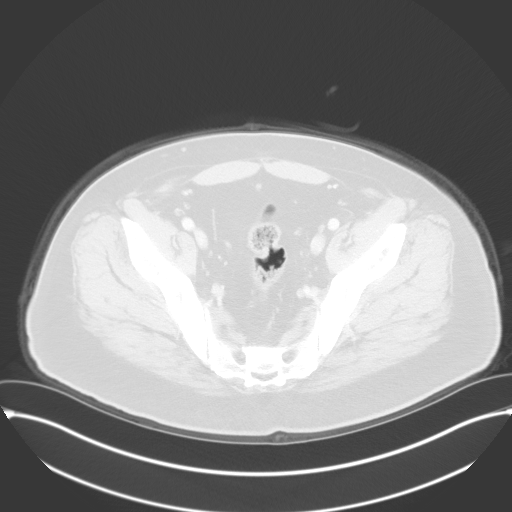
[im 42/138  lung]
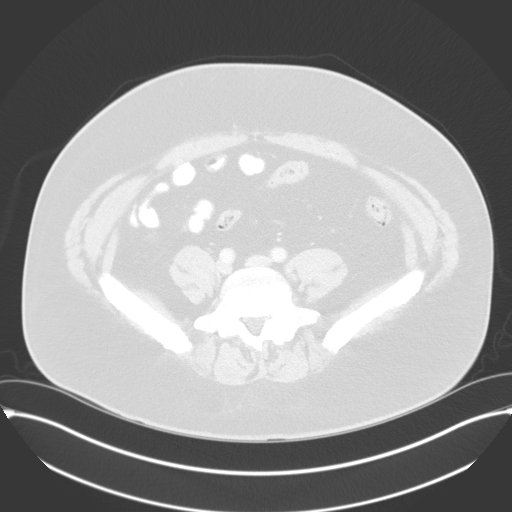
[im 55/138  lung]
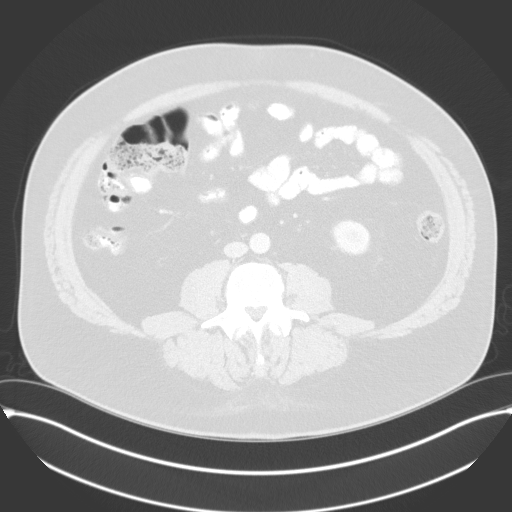
[im 69/138  mediastinal]
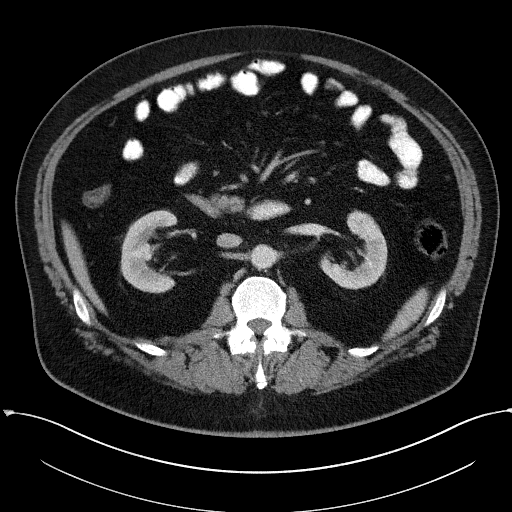
[im 69/138  lung]
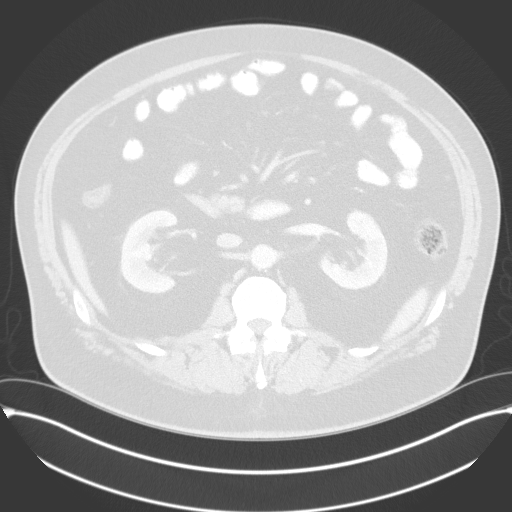
[im 83/138  lung]
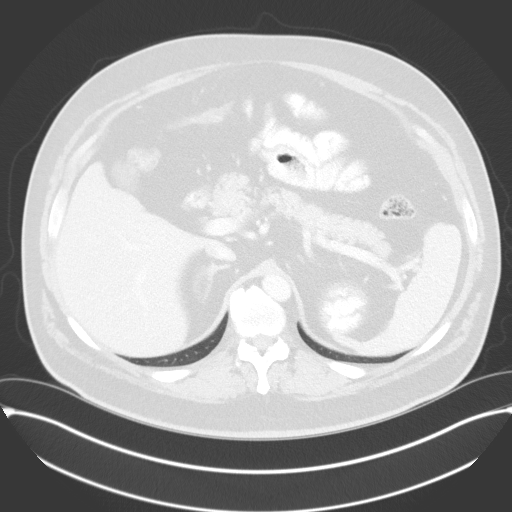
[im 96/138  lung]
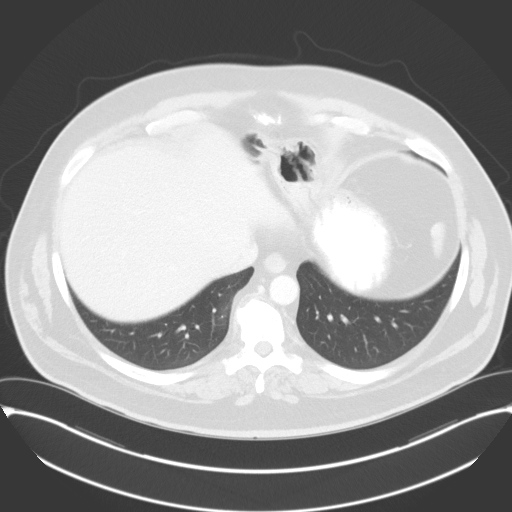
[im 110/138  lung]
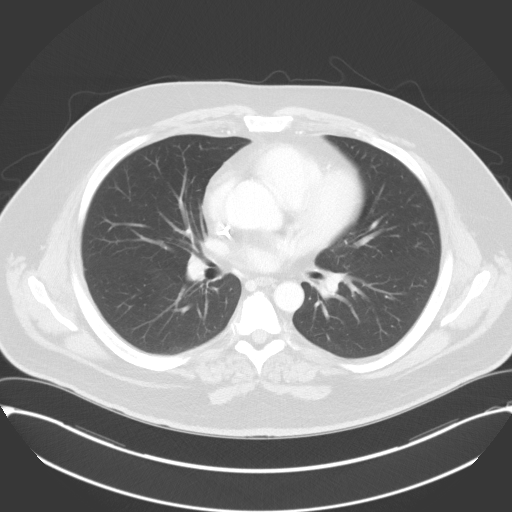
[im 124/138  mediastinal]
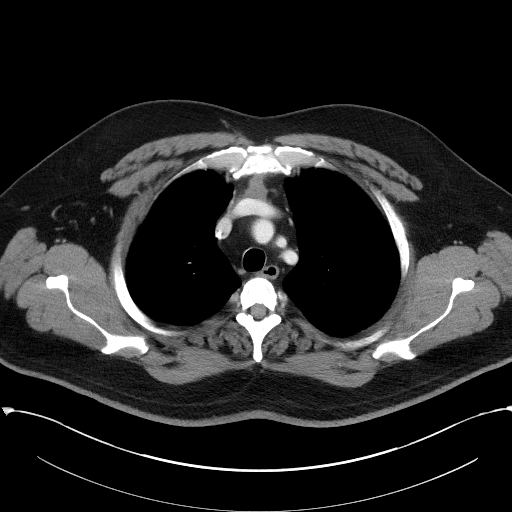
[im 124/138  lung]
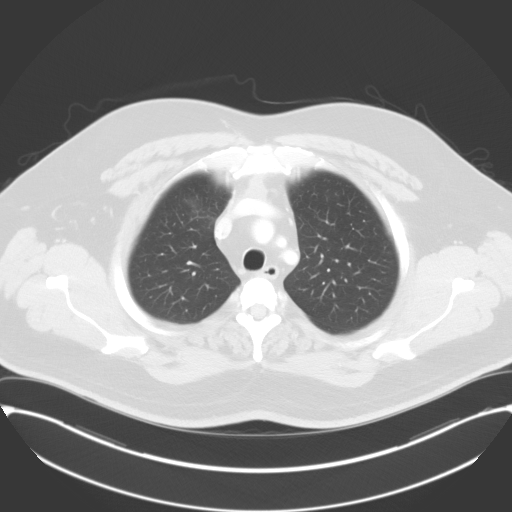

[Series 5: coronals · coronal · 0.96mm/px · 3 of 176 slices shown]
[im 36/176  lung]
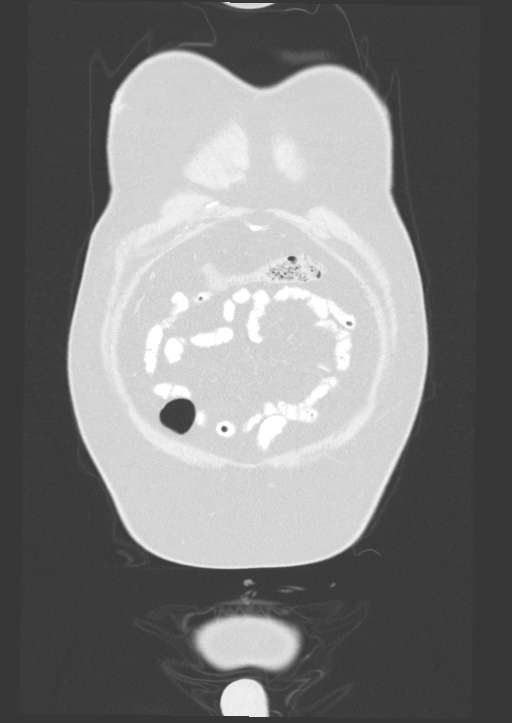
[im 71/176  lung]
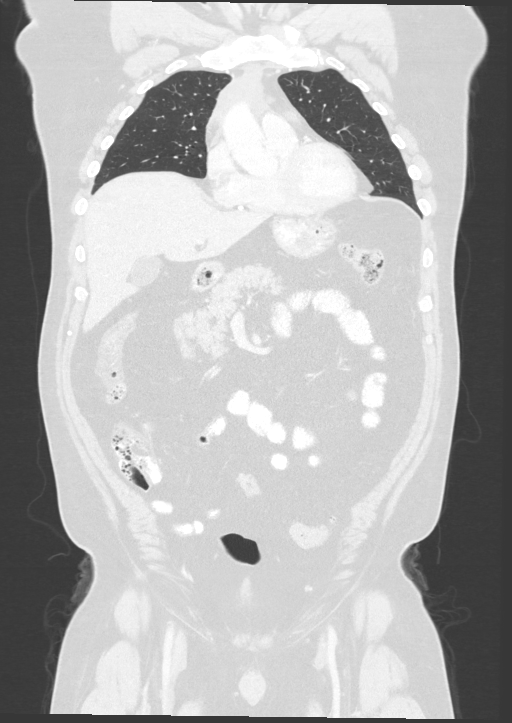
[im 106/176  lung]
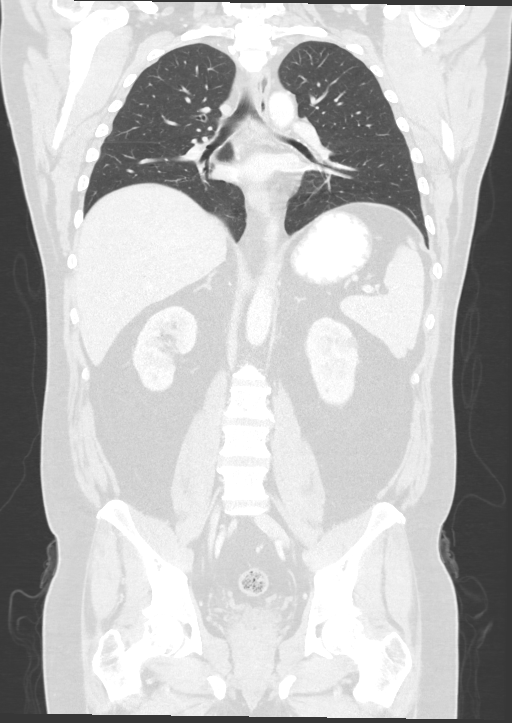

[12 of 36 positions shown; findings below may reference images not displayed]

FINDINGS: CT CHEST FINDINGS

Cardiovascular: Right Port-A-Cath tip: Cavoatrial junction.

Left anterior descending, circumflex, and right coronary artery
atherosclerotic calcification. Ascending aortic aneurysm 4.3 cm in
diameter on image [DATE].

Mediastinum/Nodes: The dominant anterior mediastinal mass is
relatively low-density and measures 6.8 by 4.5 cm, formerly 10.1 by
6.9 cm. Left mediastinal adenopathy measures 1.4 cm in short axis,
previously 2.0 cm, but is further elongated in the
anterior-posterior dimension, measuring 4.7 cm today and previously
3.2 cm.

Lungs/Pleura: Unremarkable

Musculoskeletal: Lower thoracic spondylosis.

CT ABDOMEN PELVIS FINDINGS

Hepatobiliary: Unremarkable

Pancreas: Unremarkable

Spleen: Unremarkable.  No splenomegaly.

Adrenals/Urinary Tract: 1.8 by 1.7 cm left adrenal adenoma.

Bilateral hypodense renal lesions are primarily too small to
characterize. The left kidney lower pole lesion appeared photopenic
on the prior PET-CT but the other lesions are too small to be
certain of. Particular a 1.2 by 1.1 cm right kidney lower pole
lesion was thought to potentially be complex on the prior CT
abdomen. This lesion previously measured 1.5 by 1.3 cm.

Stomach/Bowel: Unremarkable

Vascular/Lymphatic: Mild bilateral common iliac artery
atherosclerotic calcification noted. No adenopathy in the
abdomen/pelvis.

Reproductive: Upper normal prostate size. Symmetric seminal
vesicles.

Other: No supplemental non-categorized findings.

Musculoskeletal: Small umbilical hernia contains adipose tissue.
Mild bilateral degenerative hip arthropathy. Transitional S1
vertebra. Grade 1 degenerative anterolisthesis at L5-S1.
IMPRESSION: 1. The dominant anterior mediastinal mass is significantly reduced
in size, currently 6.8 by 0.5 cm, formerly 10.1 by 6.9 cm.
2. The smaller left eccentric mediastinal lymph node measures 1.4 by
4.7 cm, previously 2.0 by 3.2 cm.
3. No appreciable adenopathy in the abdomen/pelvis.
4. Bilateral hypodense renal lesions are technically too small to
characterize, including the right kidney lower pole lesion. If the
patient has hematuria or if otherwise clinically warranted, renal
protocol MRI with and without contrast could be utilized for
definitive characterization. Ascending aortic aneurysm 4.3 cm in
diameter. Recommend annual imaging followup by CTA or MRA. This
recommendation follows 2747
ACCF/AHA/AATS/ACR/ASA/SCA/TRIS/WITTE/ORMANDO/MARJONO Guidelines for the
Diagnosis and Management of Patients with Thoracic Aortic Disease.
Circulation. 2747; 121: [id number]
5. Other imaging findings of potential clinical significance:
Notable coronary artery atherosclerotic calcification. Small left
adrenal adenoma. Small umbilical hernia contains adipose tissue.
Transitional S1 vertebra.

## 2019-02-07 ENCOUNTER — Ambulatory Visit: Payer: Commercial Managed Care - PPO | Admitting: Radiation Oncology

## 2019-02-10 ENCOUNTER — Telehealth: Payer: Self-pay

## 2019-02-10 NOTE — Telephone Encounter (Signed)
Spoke with the patient to see if he need a refill for potassium. the patient states he is no longer taking this medication at this time. The patient was understanding.

## 2019-02-14 ENCOUNTER — Encounter: Payer: Self-pay | Admitting: Radiation Oncology

## 2019-02-14 ENCOUNTER — Ambulatory Visit
Admission: RE | Admit: 2019-02-14 | Discharge: 2019-02-14 | Disposition: A | Discharge status: Home / Self Care | Payer: Commercial Managed Care - PPO | Source: Ambulatory Visit | Attending: Radiation Oncology | Admitting: Radiation Oncology

## 2019-02-14 ENCOUNTER — Other Ambulatory Visit: Payer: Self-pay

## 2019-02-14 VITALS — BP 127/93 | HR 81 | Temp 96.9°F | Resp 18 | Wt 251.9 lb

## 2019-02-14 DIAGNOSIS — R05 Cough: Secondary | ICD-10-CM | POA: Insufficient documentation

## 2019-02-14 DIAGNOSIS — C8510 Unspecified B-cell lymphoma, unspecified site: Secondary | ICD-10-CM

## 2019-02-14 DIAGNOSIS — Z9221 Personal history of antineoplastic chemotherapy: Secondary | ICD-10-CM | POA: Diagnosis not present

## 2019-02-14 DIAGNOSIS — Z8572 Personal history of non-Hodgkin lymphomas: Secondary | ICD-10-CM | POA: Insufficient documentation

## 2019-02-14 DIAGNOSIS — Z923 Personal history of irradiation: Secondary | ICD-10-CM | POA: Diagnosis not present

## 2019-02-14 NOTE — Progress Notes (Signed)
Radiation Oncology Follow up Note  Name: Ronnie Mcintyre   Date:   02/14/2019 MRN:  [id number] DOB: 1965/10/24    This 53 y.o. male presents to the clinic today for 70-month follow-up status post involved field radiation therapy to his mediastinum for stage Ib large cell B lymphoma of the anterior mediastinum.  REFERRING PROVIDER: [name], MD  HPI: Patient is a 53 year old male now about 5 months having completed involved field radiation therapy to his chest for stage Ib large B-cell lymphoma status post 6 cycles of R-CHOP seen today in routine follow-up he is doing well specifically denies fever chills night sweats.  Has still has a slight nonproductive cough.  Last PET CT scan was back in April showed excellent response and chest with almost total eradication of hypermetabolic disease.[name]  He is scheduled for another follow-up CT scan in October.  COMPLICATIONS OF TREATMENT: none  FOLLOW UP COMPLIANCE: keeps appointments   PHYSICAL EXAM:  BP (!) 127/93   Pulse 81   Temp (!) 96.9 F (36.1 C)   Resp 18   Wt 251 lb 14.4 oz (114.3 kg)   BMI 34.16 kg/m  Well-developed well-nourished patient in NAD. HEENT reveals PERLA, EOMI, discs not visualized.  Oral cavity is clear. No oral mucosal lesions are identified. Neck is clear without evidence of cervical or supraclavicular adenopathy. Lungs are clear to A&P. Cardiac examination is essentially unremarkable with regular rate and rhythm without murmur rub or thrill. Abdomen is benign with no organomegaly or masses noted. Motor sensory and DTR levels are equal and symmetric in the upper and lower extremities. Cranial nerves II through XII are grossly intact. Proprioception is intact. No peripheral adenopathy or edema is identified. No motor or sensory levels are noted. Crude visual fields are within normal range.  No peripheral adenopathy is identified.  RADIOLOGY RESULTS: PET CT scan is reviewed and compatible with above-stated findings  PLAN:  Present time patient is doing well with no evidence of disease.  I am pleased with his overall progress.  We will review his chest CT when it is available in October.  Otherwise of asked to see him back in 6 months for follow-up.  Patient continues close follow-up care with medical oncology.  I would like to take this opportunity to thank you for allowing me to participate in the care of your patient.Noreene Filbert, MD

## 2019-02-16 DIAGNOSIS — R17 Unspecified jaundice: Secondary | ICD-10-CM | POA: Insufficient documentation

## 2019-03-08 ENCOUNTER — Encounter: Payer: Self-pay | Admitting: Hematology and Oncology

## 2019-03-25 DIAGNOSIS — T451X5A Adverse effect of antineoplastic and immunosuppressive drugs, initial encounter: Secondary | ICD-10-CM | POA: Insufficient documentation

## 2019-05-11 ENCOUNTER — Other Ambulatory Visit: Payer: Self-pay

## 2019-05-11 DIAGNOSIS — C8510 Unspecified B-cell lymphoma, unspecified site: Secondary | ICD-10-CM

## 2019-05-15 NOTE — Progress Notes (Signed)
[facility]  [address] [place], [place] [address] Phone: [phone]  Fax: [phone]   Clinic Day:  05/18/2019  Referring physician: [name], MD  Chief Complaint: Ronnie Mcintyre is a 53 y.o. male with stage IB large B-cell lymphoma who is seen for 3 month assessment and review of interval imaging studies.   HPI: The patient was last seen in the medical oncology clinic on 01/26/2019. At that time,  he denied any fevers, sweats or weight loss. He denied any symptoms of CHF.  Exam revealed slight decreased breath sounds in the right lower lobe. CBC was normal. Bilirubin 1.5 (direct 0.2). Uric acid was 6.9.    Patient had a 5 month follow up with Dr Baruch Gouty on 02/14/2019. He had a slight productive cough. He denied any fevers, chills, and night sweats. Follow-up was scheduled in 6 months.   Chest, abdomen and pelvis CT on 05/16/2019 revealed continued interval decrease in size of minimal residual anterior mediastinal soft tissue density (1.6 x 3.2 cm compared to 2.2 x 4.2 cm).  There were no new findings in the chest and no evidence of metastatic disease within the abdomen/pelvis. There were a small bilateral renal cysts. There was a stable 1.36 cm left adrenal nodule likely adenoma. There was a mild ectasia of the ascending thoracic aorta measuring 3.8 cm in AP diameter unchanged. Recommend annual imaging follow up by CTA or MRA.   Labs on 05/16/2019 included a hematocrit 46.5, hemoglobin 16.2, platelets 180,000, WBC 7,100. Calcium 8.8. LDH 134. Uric acid was 7.5.   During the interim, he has felt good.  He notes being tired at the end of a 50 hour work week. He reports an upcoming appointment with his cardiologist in 05/2019.   The patient wanted to know when he could have his port removed.  I informed patient that he could have it removed anytime.  I informed patient that people normally keep it in for about a year.    Past Medical History:   Diagnosis Date  . Cancer ([place])   . Hypertension     Past Surgical History:  Procedure Laterality Date  . ANKLE SURGERY Right   . PORTACATH PLACEMENT Right 02/11/2018   Procedure: INSERTION PORT-A-CATH;  Surgeon: Jules Husbands, MD;  Location: [facility] ORS;  Service: General;  Laterality: Right;    Family History  Problem Relation Age of Onset  . Hypertension Mother   . Hypertension Father   . Multiple myeloma Father     Social History:  reports that he has never smoked. He has never used smokeless tobacco. He reports current alcohol use. He reports that he does not use drugs. He drinks beer 1-2x/week.He is walking for an hour, about 2 miles/day.He works for [employer], about 9-10 hours/day 6 days a week. He lives in [place]. The patient is alone today.  Allergies: No Known Allergies  Current Medications: Current Outpatient Medications  Medication Sig Dispense Refill  . aspirin 81 MG tablet Take 81 mg by mouth daily.     . carvedilol (COREG) 6.25 MG tablet Take 1 tablet (6.25 mg total) by mouth 2 (two) times daily with a meal. 180 tablet 3  . simvastatin (ZOCOR) 40 MG tablet Take 40 mg by mouth daily.    [name] telmisartan (MICARDIS) 80 MG tablet Take by mouth.    [name] acetaminophen (TYLENOL) 325 MG tablet Take 2 tablets (650 mg total) by mouth every 6 (six) hours as needed for  mild pain (or Fever >/= 101). (Patient not taking: Reported on 01/26/2019)     No current facility-administered medications for this visit.     Review of Systems  Constitutional: Positive for malaise/fatigue (work related). Negative for chills, diaphoresis, fever and weight loss (up 4 lbs).       Feels "good".  HENT: Negative.  Negative for congestion, ear pain, hearing loss, nosebleeds, sinus pain, sore throat and tinnitus.   Eyes: Negative.  Negative for blurred vision, double vision and photophobia.  Respiratory: Negative.  Negative for cough, hemoptysis, sputum production, shortness of breath and  wheezing.   Cardiovascular: Negative.  Negative for chest pain, palpitations, orthopnea, leg swelling and PND.  Gastrointestinal: Negative.  Negative for abdominal pain, blood in stool, constipation, diarrhea, heartburn, nausea and vomiting.  Genitourinary: Negative.  Negative for dysuria, frequency, hematuria and urgency.  Musculoskeletal: Negative.  Negative for back pain, joint pain, myalgias and neck pain.  Skin: Negative for itching and rash.       Forehead scar s/p insect bite.  Neurological: Negative.  Negative for dizziness, tingling, sensory change, speech change, focal weakness, weakness and headaches.  Endo/Heme/Allergies: Negative.  Does not bruise/bleed easily.  Psychiatric/Behavioral: Negative.  Negative for memory loss. The patient is not nervous/anxious and does not have insomnia.   All other systems reviewed and are negative.  Performance status (ECOG):  0  Vitals Blood pressure 118/64, pulse 88, temperature (!) 96.7 F (35.9 C), temperature source Tympanic, resp. rate 18, height 6' (1.829 m), weight 254 lb 6.6 oz (115.4 kg), SpO2 100 %.  Physical Exam  Constitutional: He is oriented to person, place, and time. He appears well-developed and well-nourished. No distress.  HENT:  Head: Normocephalic and atraumatic.  Mouth/Throat: Oropharynx is clear and moist. No oropharyngeal exudate.  Alopecia.  Mask.  Eyes: Pupils are equal, round, and reactive to light. Conjunctivae and EOM are normal. Right eye exhibits no chemosis. No scleral icterus.  Blue eyes.  Neck: Normal range of motion. Neck supple. No JVD present.  Cardiovascular: Normal rate, regular rhythm and normal heart sounds. Exam reveals no gallop and no friction rub.  No murmur heard. Pulmonary/Chest: Effort normal and breath sounds normal. No respiratory distress. He has no wheezes. He has no rales.  Abdominal: Soft. Bowel sounds are normal. He exhibits no distension and no mass. There is no abdominal tenderness.  There is no rebound and no guarding.  Musculoskeletal: Normal range of motion.        General: No tenderness or edema.  Lymphadenopathy:       Head (right side): No preauricular, no posterior auricular and no occipital adenopathy present.       Head (left side): No preauricular, no posterior auricular and no occipital adenopathy present.    He has no cervical adenopathy.    He has no axillary adenopathy.       Right: No inguinal and no supraclavicular adenopathy present.       Left: No inguinal and no supraclavicular adenopathy present.  Neurological: He is alert and oriented to person, place, and time. He has normal reflexes.  Skin: Skin is warm and dry. No rash noted. He is not diaphoretic. No erythema. No pallor.  Scar on right side of forehead.  Psychiatric: He has a normal mood and affect. His behavior is normal. Judgment and thought content normal.  Nursing note and vitals reviewed.   Infusion on 05/16/2019  Component Date Value Ref Range Status  . Uric Acid, Serum 05/16/2019 7.5  3.7 - 8.6 mg/dL Final   Performed at [facility], [address], [place], [place] [address]  . LDH 05/16/2019 134  98 - 192 U/L Final   Performed at [facility], [address] [place], [place] [address]  . Sodium 05/16/2019 136  135 - 145 mmol/L Final  . Potassium 05/16/2019 3.8  3.5 - 5.1 mmol/L Final  . Chloride 05/16/2019 102  98 - 111 mmol/L Final  . CO2 05/16/2019 27  22 - 32 mmol/L Final  . Glucose, Bld 05/16/2019 105* 70 - 99 mg/dL Final  . BUN 05/16/2019 15  6 - 20 mg/dL Final  . Creatinine, Ser 05/16/2019 1.09  0.61 - 1.24 mg/dL Final  . Calcium 05/16/2019 8.8* 8.9 - 10.3 mg/dL Final  . Total Protein 05/16/2019 6.6  6.5 - 8.1 g/dL Final  . Albumin 05/16/2019 3.9  3.5 - 5.0 g/dL Final  . AST 05/16/2019 22  15 - 41 U/L Final  . ALT 05/16/2019 28  0 - 44 U/L Final  . Alkaline Phosphatase 05/16/2019 62  38 - 126 U/L Final  . Total Bilirubin 05/16/2019 1.1  0.3 -  1.2 mg/dL Final  . GFR calc non Af Amer 05/16/2019 >60  >60 mL/min Final  . GFR calc Af Amer 05/16/2019 >60  >60 mL/min Final  . Anion gap 05/16/2019 7  5 - 15 Final   Performed at [facility], [address] [place], [place] [address]  . WBC 05/16/2019 7.1  4.0 - 10.5 K/uL Final  . RBC 05/16/2019 5.41  4.22 - 5.81 MIL/uL Final  . Hemoglobin 05/16/2019 16.2  13.0 - 17.0 g/dL Final  . HCT 05/16/2019 46.5  39.0 - 52.0 % Final  . MCV 05/16/2019 86.0  80.0 - 100.0 fL Final  . MCH 05/16/2019 29.9  26.0 - 34.0 pg Final  . MCHC 05/16/2019 34.8  30.0 - 36.0 g/dL Final  . RDW 05/16/2019 15.2  11.5 - 15.5 % Final  . Platelets 05/16/2019 180  150 - 400 K/uL Final  . nRBC 05/16/2019 0.0  0.0 - 0.2 % Final  . Neutrophils Relative % 05/16/2019 81  % Final  . Neutro Abs 05/16/2019 5.8  1.7 - 7.7 K/uL Final  . Lymphocytes Relative 05/16/2019 9  % Final  . Lymphs Abs 05/16/2019 0.7  0.7 - 4.0 K/uL Final  . Monocytes Relative 05/16/2019 7  % Final  . Monocytes Absolute 05/16/2019 0.5  0.1 - 1.0 K/uL Final  . Eosinophils Relative 05/16/2019 2  % Final  . Eosinophils Absolute 05/16/2019 0.1  0.0 - 0.5 K/uL Final  . Basophils Relative 05/16/2019 1  % Final  . Basophils Absolute 05/16/2019 0.0  0.0 - 0.1 K/uL Final  . Immature Granulocytes 05/16/2019 0  % Final  . Abs Immature Granulocytes 05/16/2019 0.02  0.00 - 0.07 K/uL Final   Performed at [facility], [address] [place], [place] [address]    Assessment:  Ronnie Mcintyre is a 53 y.o. 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 [facility] pathologyrevealed necrotic tissue. [facility] consultationrevealed neoplastic cells are positive for CD20, BCL-6, MUM-1, and BCL-2. The neoplastic cells were negative for CD30, CD3, , CD10, c-MYC, EBV ISH, AE1/3, and PAX-8. Ki67 is 60-70%. 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 The absence of  CD30 expression makes primary mediastinal large B-cell lymphoma less likely. FISH for [facility]  rearrangement was negative.IPI score, age adjusted IPI, are low (1) and NCCN IPI scoreis low-intermediate (2).  He presented with upper chest fullness with radiation to his neck and arm. He has had B symptoms (sweats and weight loss) worrisome for lymphoma. LDHis 273. Uric acid was normal. Beta-HCG and AFP were normal on 01/16/2018.  Chest CT angiogramon 01/15/2018 revealed an 11.3 x 8.0 cm anterior mediastinal mass.  Abdomen and pelvic CT on 01/16/2018 revealeda 6.3 x 5.6 cm soft tissue mass or medium density fluid filling the urinary bladder.There was a1.5 cm mildly complex lower pole right renal cyst.There was a1.9 cm indeterminate left adrenal mass.There was a small pericardial effusion. Renal ultrasoundon 01/25/2018 revealed no evidence of bladder mass. Finding on prior CT exam likely represented excretion of residual contrast material from a CTA chest exam performed 1 day prior   PET scanon 01/26/2018 revealed an 11.1 cm intensely hypermetabolic and partially necrotic right anterior mediastinal mass, compatible with malignancy. There was diffuse marrow hypermetabolism, nonspecific, cannot exclude an infiltrative neoplastic marrow process. There was no focal skeletal hypermetabolism. There was no focal bone lesions on the CT images. There was nonspecific mild heterogeneous prostatic hypermetabolism. 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   Bone marrow biopsyon 02/03/2018 revealed a slightly hypercellular bone marrow for age with trilineage hematopoiesis. There was no monoclonal B-cell population or abnormal T-cell phenotype identified. Cytogenetics were normal (46, XY).  Echoon 01/15/2018 revealed an EF of 55-60%. Echoon 04/22/2018 revealed an EF of 50 to 55%. There was evidence of probablesevere  hypokinesisof the mid apical-anteroseptal, anterior, and apical myocardium. Treadmill Myoviewon 04/26/2018 revealed a low risk study with a small defect present in the apex location that was partially reversible and could represent small previous myocardial infarction. However, and apical thinning artifact could not be excluded. Nuclear stress EF: 50%. Echoon 05/17/2018 revealed an EF of 50-55%.  Echo on 08/31/2018 revealed an ejection fraction of 40-45%.  Hepatitis B, hepatitis C, and HIV testing was negative on 02/10/2018. G6PD assay was normal.  He received 6 cycles of RCHOPchemotherapy (02/16/2018 - 06/10/2018) with Neulasta support. He received radiation(3600 cGy in 18 fractions) from 07/27/2018 - 08/19/2018.  Chest, abdomen, and pelvic CTon 04/19/2018 revealed the anterior mediastinal mass had significantly reduced in size (10.1 x 6.9 cm to 6.8 by 4.5 cm). The smaller left eccentric mediastinal lymph node measured 1.4 x 4.7 cm, previously 2.0 x 3.2 cm. There was no appreciable adenopathy in the abdomen/pelvis. There were bilateral hypodense renal lesions technically too small to characterize, including the right kidney lower pole lesion.   PET scanon 07/02/2018 revealed clear interval response to therapy with decrease in size and hypermetabolism of the anterior mediastinal mass (6.9 x 11.1 cm to 2 x 2.6 cm; SUV 14.3 to 5.3). There was a similar appearance of the diffuse marrow hypermetabolism, nonspecific. There was a 4.2 cm ascending thoracic aortic diameter. There was a stable left adrenal adenoma.  He was diagnosed with T6-T7herpes zosteron 03/19/2018. He was treated with valacyclovir. He was on prophylactic valacyclovir.  PET scanon 11/18/2018 revealed continued decrease in metabolic activity of anterior mediastinal mass ([place] 3), no evidence of lymphoma progression on skull base to thigh, and new focus of metabolic airspace disease in the medialrightlower  lobe favored focus of pulmonary infection (pneumonia).  Chest, abdomen and pelvis CT on 05/16/2019 revealed continued interval decrease in size of minimal residual anterior mediastinal soft tissue density (1.6 x 3.2 cm compared to  2.2 x 4.2 cm).  There were no new findings in the chest and no evidence of metastatic disease within the abdomen/pelvis. There were a small bilateral renal cysts. There was a stable 1.36 cm left adrenal nodule likely adenoma. There was a mild ectasia of the ascending thoracic aorta measuring 3.8 cm in AP diameter unchanged. Recommend annual imaging follow up by CTA or MRA.  Symptomatically, he is doing well.  He denies any fevers, sweats or weight loss.  Exam reveals no adenopathy or hepatosplenomegaly.  Plan: 1.   Review labs from 05/16/2019. 2.   Diffuse large B cell lymphoma Clinically, he is doing well. Chest, abdomen, and pelvic CT scan on 05/16/2019 revealed no evidence of disease.  Continue surveillance. 3.Cardiac wall motion abnormalities Clinically, he is doing well with no symptoms of CHF. Echo on 08/31/2017 revealed an EF of 40-45%. He is followed closely by Dr. Fletcher Anon of cardiology. 4.Elevated bilirubin Bilirubin1.5 (direct 0.2) on 01/26/2019.  Bilirubin 1.1 on 05/16/2019. 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 5.Port-a-cath  Discuss future plans for port-a-cath removal.  Continue port flush every 6 weeks. 6.   RTC in 3 months for MD assessment and labs (CBC with diff, CMP, LDH, uric acid).  I discussed the assessment and treatment plan with the patient.  The patient was provided an opportunity to ask questions and all were answered.  The patient agreed with the plan and demonstrated an understanding of the instructions.  The patient was advised to call back if the symptoms worsen or if the condition fails to improve as anticipated.   Lequita Asal, MD, PhD    05/18/2019, 8:49 AM  I, Selena Batten, am acting as scribe for [employer]. Mike Gip, MD, PhD.  I, Melissa C. Mike Gip, MD, have reviewed the above documentation for accuracy and completeness, and I agree with the above.

## 2019-05-16 ENCOUNTER — Inpatient Hospital Stay: Payer: Commercial Managed Care - PPO

## 2019-05-16 ENCOUNTER — Ambulatory Visit
Admission: RE | Admit: 2019-05-16 | Discharge: 2019-05-16 | Disposition: A | Discharge status: Home / Self Care | Payer: Commercial Managed Care - PPO | Source: Ambulatory Visit | Attending: Hematology and Oncology | Admitting: Hematology and Oncology

## 2019-05-16 ENCOUNTER — Other Ambulatory Visit: Payer: Self-pay

## 2019-05-16 DIAGNOSIS — D3502 Benign neoplasm of left adrenal gland: Secondary | ICD-10-CM | POA: Diagnosis not present

## 2019-05-16 DIAGNOSIS — I10 Essential (primary) hypertension: Secondary | ICD-10-CM | POA: Diagnosis not present

## 2019-05-16 DIAGNOSIS — C8510 Unspecified B-cell lymphoma, unspecified site: Secondary | ICD-10-CM

## 2019-05-16 DIAGNOSIS — C8332 Diffuse large B-cell lymphoma, intrathoracic lymph nodes: Secondary | ICD-10-CM | POA: Diagnosis present

## 2019-05-16 DIAGNOSIS — R0989 Other specified symptoms and signs involving the circulatory and respiratory systems: Secondary | ICD-10-CM | POA: Insufficient documentation

## 2019-05-16 LAB — CBC WITH DIFFERENTIAL/PLATELET
Abs Immature Granulocytes: 0.02 10*3/uL (ref 0.00–0.07)
Basophils Absolute: 0 10*3/uL (ref 0.0–0.1)
Basophils Relative: 1 %
Eosinophils Absolute: 0.1 10*3/uL (ref 0.0–0.5)
Eosinophils Relative: 2 %
HCT: 46.5 % (ref 39.0–52.0)
Hemoglobin: 16.2 g/dL (ref 13.0–17.0)
Immature Granulocytes: 0 %
Lymphocytes Relative: 9 %
Lymphs Abs: 0.7 10*3/uL (ref 0.7–4.0)
MCH: 29.9 pg (ref 26.0–34.0)
MCHC: 34.8 g/dL (ref 30.0–36.0)
MCV: 86 fL (ref 80.0–100.0)
Monocytes Absolute: 0.5 10*3/uL (ref 0.1–1.0)
Monocytes Relative: 7 %
Neutro Abs: 5.8 10*3/uL (ref 1.7–7.7)
Neutrophils Relative %: 81 %
Platelets: 180 10*3/uL (ref 150–400)
RBC: 5.41 MIL/uL (ref 4.22–5.81)
RDW: 15.2 % (ref 11.5–15.5)
WBC: 7.1 10*3/uL (ref 4.0–10.5)
nRBC: 0 % (ref 0.0–0.2)

## 2019-05-16 LAB — COMPREHENSIVE METABOLIC PANEL
ALT: 28 U/L (ref 0–44)
AST: 22 U/L (ref 15–41)
Albumin: 3.9 g/dL (ref 3.5–5.0)
Alkaline Phosphatase: 62 U/L (ref 38–126)
Anion gap: 7 (ref 5–15)
BUN: 15 mg/dL (ref 6–20)
CO2: 27 mmol/L (ref 22–32)
Calcium: 8.8 mg/dL — ABNORMAL LOW (ref 8.9–10.3)
Chloride: 102 mmol/L (ref 98–111)
Creatinine, Ser: 1.09 mg/dL (ref 0.61–1.24)
GFR calc Af Amer: >60 mL/min (ref >60)
GFR calc non Af Amer: >60 mL/min (ref >60)
Glucose, Bld: 105 mg/dL — ABNORMAL HIGH (ref 70–99)
Potassium: 3.8 mmol/L (ref 3.5–5.1)
Sodium: 136 mmol/L (ref 135–145)
Total Bilirubin: 1.1 mg/dL (ref 0.3–1.2)
Total Protein: 6.6 g/dL (ref 6.5–8.1)

## 2019-05-16 LAB — LACTATE DEHYDROGENASE: LDH: 134 U/L (ref 98–192)

## 2019-05-16 LAB — URIC ACID: Uric Acid, Serum: 7.5 mg/dL (ref 3.7–8.6)

## 2019-05-16 MED ORDER — SODIUM CHLORIDE 0.9% FLUSH
10.0000 mL | INTRAVENOUS | Status: DC | PRN
  Administered 2019-05-16: 10:00:00 10 mL via INTRAVENOUS
  Filled 2019-05-16: qty 10

## 2019-05-16 MED ORDER — IOHEXOL 300 MG/ML  SOLN
100.0000 mL | Freq: Once | INTRAMUSCULAR | Status: AC | PRN
  Administered 2019-05-16: 11:00:00 100 mL via INTRAVENOUS

## 2019-05-16 MED ORDER — HEPARIN SOD (PORK) LOCK FLUSH 100 UNIT/ML IV SOLN
500.0000 [IU] | Freq: Once | INTRAVENOUS | Status: AC
  Administered 2019-05-16: 500 [IU] via INTRAVENOUS

## 2019-05-18 ENCOUNTER — Other Ambulatory Visit: Payer: Self-pay

## 2019-05-18 ENCOUNTER — Inpatient Hospital Stay: Payer: Commercial Managed Care - PPO | Attending: Hematology and Oncology | Admitting: Hematology and Oncology

## 2019-05-18 ENCOUNTER — Encounter: Payer: Self-pay | Admitting: Hematology and Oncology

## 2019-05-18 VITALS — BP 118/64 | HR 88 | Temp 96.7°F | Resp 18 | Ht 72.0 in | Wt 254.4 lb

## 2019-05-18 DIAGNOSIS — R931 Abnormal findings on diagnostic imaging of heart and coronary circulation: Secondary | ICD-10-CM

## 2019-05-18 DIAGNOSIS — Z7189 Other specified counseling: Secondary | ICD-10-CM | POA: Diagnosis not present

## 2019-05-18 DIAGNOSIS — C8510 Unspecified B-cell lymphoma, unspecified site: Secondary | ICD-10-CM | POA: Diagnosis not present

## 2019-05-18 DIAGNOSIS — C8332 Diffuse large B-cell lymphoma, intrathoracic lymph nodes: Secondary | ICD-10-CM | POA: Diagnosis not present

## 2019-05-18 NOTE — Progress Notes (Signed)
No new changes noted today 

## 2019-06-29 ENCOUNTER — Other Ambulatory Visit: Payer: Self-pay

## 2019-06-29 ENCOUNTER — Inpatient Hospital Stay: Payer: Commercial Managed Care - PPO | Attending: Hematology and Oncology

## 2019-06-29 DIAGNOSIS — C8332 Diffuse large B-cell lymphoma, intrathoracic lymph nodes: Secondary | ICD-10-CM | POA: Insufficient documentation

## 2019-06-29 DIAGNOSIS — Z95828 Presence of other vascular implants and grafts: Secondary | ICD-10-CM

## 2019-06-29 DIAGNOSIS — Z452 Encounter for adjustment and management of vascular access device: Secondary | ICD-10-CM | POA: Diagnosis present

## 2019-06-29 MED ORDER — HEPARIN SOD (PORK) LOCK FLUSH 100 UNIT/ML IV SOLN
500.0000 [IU] | Freq: Once | INTRAVENOUS | Status: AC
  Administered 2019-06-29: 09:00:00 500 [IU] via INTRAVENOUS

## 2019-06-29 MED ORDER — SODIUM CHLORIDE 0.9% FLUSH
10.0000 mL | INTRAVENOUS | Status: DC | PRN
  Administered 2019-06-29: 10 mL via INTRAVENOUS
  Filled 2019-06-29: qty 10

## 2019-08-16 ENCOUNTER — Other Ambulatory Visit: Payer: Self-pay | Admitting: Cardiovascular Disease

## 2019-08-16 NOTE — Telephone Encounter (Signed)
Done

## 2019-08-16 NOTE — Telephone Encounter (Signed)
Please schedule overdue F/U appointment with Dr. Arida. Thank you! ?

## 2019-08-17 ENCOUNTER — Inpatient Hospital Stay: Payer: Commercial Managed Care - PPO

## 2019-08-17 ENCOUNTER — Inpatient Hospital Stay: Payer: Commercial Managed Care - PPO | Attending: Hematology and Oncology | Admitting: Hematology and Oncology

## 2019-08-22 ENCOUNTER — Ambulatory Visit
Admission: RE | Admit: 2019-08-22 | Payer: Commercial Managed Care - PPO | Source: Ambulatory Visit | Admitting: Radiation Oncology

## 2019-08-22 NOTE — Progress Notes (Signed)
This encounter was created in error - please disregard.

## 2019-08-28 NOTE — Progress Notes (Signed)
[facility]  [address], [address] [place], [place] [address] Phone: [phone]  Fax: [phone]   Telemedicine Office Visit:  08/31/2019  Referring physician: [name], MD  I connected with Ronnie Mcintyre on 08/31/19 at 10:37 AM EDT by videoconferencing and verified that I was speaking with the correct person using 2 identifiers.  The patient was at work.  I discussed the limitations, risk, security and privacy concerns of performing an evaluation and management service by videoconferencing and  the availability of in person appointments.  I also discussed with the patient that there may be a patient responsible charge related to this service.  The patient expressed understanding and agreed to proceed.   Chief Complaint: Ronnie Mcintyre is a 54 y.o. male with IBlarge B-cell lymphoma who is seen for 3 month assessment.   HPI: The patient was last seen in the medical oncology clinic on 05/18/2019. At that time,  he was doing well.  He denied any fevers, sweats or weight loss.  Exam revealed no adenopathy or hepatosplenomegaly.  Labs revealed a normal CBC, LDH, and uric acid.  During the interim, he has felt "pretty good".  He notes being in quarantine a month ago secondary to COVID-19 his office.  He has been busy with work.  He states that he is tired at the end of the week felt secondary to his long hours.  He denies any fevers, sweats or weight loss.  He denies any adenopathy, bruising or bleeding.  He has no symptoms of CHF.  He has an appointment with Dr. Fletcher Anon in 09/2019.   Past Medical History:  Diagnosis Date  . Cancer ([place])   . Hypertension     Past Surgical History:  Procedure Laterality Date  . ANKLE SURGERY Right   . PORTACATH PLACEMENT Right 02/11/2018   Procedure: INSERTION PORT-A-CATH;  Surgeon: Jules Husbands, MD;  Location: [facility] ORS;  Service: General;  Laterality: Right;    Family History  Problem Relation Age of Onset  .  Hypertension Mother   . Hypertension Father   . Multiple myeloma Father     Social History:  reports that he has never smoked. He has never used smokeless tobacco. He reports current alcohol use. He reports that he does not use drugs. He drinks beer 1-2x/week.He is walkingfor an hour, about2 miles/day.He works for [employer], about 9-10 hours/day 6 days a week. He lives in [place] The patient is alone today.  Participants in the patient's visit and their role in the encounter included the patient and Orlene Och, RN today.  The intake visit was provided by Orlene Och, RN.   Allergies: No Known Allergies  Current Medications: Current Outpatient Medications  Medication Sig Dispense Refill  . aspirin 81 MG tablet Take 81 mg by mouth daily.     . carvedilol (COREG) 6.25 MG tablet TAKE 1 TABLET (6.25 MG TOTAL) BY MOUTH 2 (TWO) TIMES DAILY WITH A MEAL. 180 tablet 0  . simvastatin (ZOCOR) 40 MG tablet Take 40 mg by mouth daily.    [name] telmisartan (MICARDIS) 80 MG tablet Take by mouth.    [name] acetaminophen (TYLENOL) 325 MG tablet Take 2 tablets (650 mg total) by mouth every 6 (six) hours as needed for mild pain (or Fever >/= 101). (Patient not taking: Reported on 01/26/2019)     No current facility-administered medications for this visit.    Review of Systems  Constitutional: Negative for chills, diaphoresis, fever, malaise/fatigue (work  related) and weight loss (no new weight).       Feels "pretty good".  HENT: Negative.  Negative for congestion, ear pain, hearing loss, nosebleeds, sinus pain, sore throat and tinnitus.   Eyes: Negative.  Negative for blurred vision, double vision and photophobia.  Respiratory: Negative.  Negative for cough, hemoptysis, sputum production, shortness of breath and wheezing.   Cardiovascular: Negative.  Negative for chest pain, palpitations, orthopnea, leg swelling and PND.  Gastrointestinal: Negative.  Negative for abdominal pain, blood in  stool, constipation, diarrhea, heartburn, nausea and vomiting.  Genitourinary: Negative.  Negative for dysuria, frequency, hematuria and urgency.  Musculoskeletal: Negative.  Negative for back pain, joint pain, myalgias and neck pain.  Skin: Negative.  Negative for itching and rash.  Neurological: Negative.  Negative for dizziness, tingling, sensory change, speech change, focal weakness, weakness and headaches.  Endo/Heme/Allergies: Negative.  Does not bruise/bleed easily.  Psychiatric/Behavioral: Negative.  Negative for depression and memory loss. The patient is not nervous/anxious and does not have insomnia.   All other systems reviewed and are negative.  Performance status (ECOG):  0  Vitals There were no vitals taken for this visit.   Physical Exam  Constitutional: He is oriented to person, place, and time. He appears well-developed and well-nourished. No distress.  HENT:  Head: Normocephalic and atraumatic.  Cap.  Short gray hair.  Mask.  Eyes: Conjunctivae and EOM are normal. Right eye exhibits no chemosis. No scleral icterus.  Blue eyes.  Neurological: He is alert and oriented to person, place, and time.  Skin: He is not diaphoretic.  Psychiatric: He has a normal mood and affect. His behavior is normal. Judgment and thought content normal.  Nursing note reviewed.   Infusion on 08/30/2019  Component Date Value Ref Range Status  . Uric Acid, Serum 08/30/2019 7.2  3.7 - 8.6 mg/dL Final   Performed at [facility], [address] [place], [place] [address]  . LDH 08/30/2019 136  98 - 192 U/L Final   Performed at [facility], [address], [place], [place] [address]  . Sodium 08/30/2019 135  135 - 145 mmol/L Final  . Potassium 08/30/2019 4.0  3.5 - 5.1 mmol/L Final  . Chloride 08/30/2019 102  98 - 111 mmol/L Final  . CO2 08/30/2019 25  22 - 32 mmol/L Final  . Glucose, Bld 08/30/2019 91  70 - 99 mg/dL Final  . BUN 08/30/2019 17  6 - 20 mg/dL Final    . Creatinine, Ser 08/30/2019 0.95  0.61 - 1.24 mg/dL Final  . Calcium 08/30/2019 9.3  8.9 - 10.3 mg/dL Final  . Total Protein 08/30/2019 6.7  6.5 - 8.1 g/dL Final  . Albumin 08/30/2019 4.0  3.5 - 5.0 g/dL Final  . AST 08/30/2019 20  15 - 41 U/L Final  . ALT 08/30/2019 27  0 - 44 U/L Final  . Alkaline Phosphatase 08/30/2019 61  38 - 126 U/L Final  . Total Bilirubin 08/30/2019 1.4* 0.3 - 1.2 mg/dL Final  . GFR calc non Af Amer 08/30/2019 >60  >60 mL/min Final  . GFR calc Af Amer 08/30/2019 >60  >60 mL/min Final  . Anion gap 08/30/2019 8  5 - 15 Final   Performed at [facility], [address] [place],  [address]  . WBC 08/30/2019 8.3  4.0 - 10.5 K/uL Final  . RBC 08/30/2019 5.57  4.22 - 5.81 MIL/uL Final  . Hemoglobin 08/30/2019 16.6  13.0 -  17.0 g/dL Final  . HCT 08/30/2019 47.6  39.0 - 52.0 % Final  . MCV 08/30/2019 85.5  80.0 - 100.0 fL Final  . MCH 08/30/2019 29.8  26.0 - 34.0 pg Final  . MCHC 08/30/2019 34.9  30.0 - 36.0 g/dL Final  . RDW 08/30/2019 14.6  11.5 - 15.5 % Final  . Platelets 08/30/2019 207  150 - 400 K/uL Final  . nRBC 08/30/2019 0.0  0.0 - 0.2 % Final  . Neutrophils Relative % 08/30/2019 76  % Final  . Neutro Abs 08/30/2019 6.4  1.7 - 7.7 K/uL Final  . Lymphocytes Relative 08/30/2019 12  % Final  . Lymphs Abs 08/30/2019 1.0  0.7 - 4.0 K/uL Final  . Monocytes Relative 08/30/2019 8  % Final  . Monocytes Absolute 08/30/2019 0.7  0.1 - 1.0 K/uL Final  . Eosinophils Relative 08/30/2019 2  % Final  . Eosinophils Absolute 08/30/2019 0.2  0.0 - 0.5 K/uL Final  . Basophils Relative 08/30/2019 1  % Final  . Basophils Absolute 08/30/2019 0.1  0.0 - 0.1 K/uL Final  . Immature Granulocytes 08/30/2019 1  % Final  . Abs Immature Granulocytes 08/30/2019 0.05  0.00 - 0.07 K/uL Final   Performed at [facility], [address], [place], [place] [address]    Assessment:  Ronnie Mcintyre is a 54 y.o. 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 [facility]. [facility] consultationrevealed neoplastic cells are positive for CD20, BCL-6, MUM-1, and BCL-2. The neoplastic cells were negative for CD30, CD3, , CD10, c-MYC, EBV ISH, AE1/3, and PAX-8. Ki67 is 60-70%. 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 The absence of CD30 expression makes primary mediastinal large B-cell lymphoma less likely. FISH for MYC rearrangement was negative.IPI score, age adjusted IPI, are low (1) and NCCN IPI scoreis low-intermediate (2).  He presented with upper chest fullness with radiation to his neck and arm. He has had B symptoms (sweats and weight loss) worrisome for lymphoma. LDHis 273. Uric acid was normal. Beta-HCG and AFP were normal on 01/16/2018.  Chest CT angiogramon 01/15/2018 revealed an 11.3 x 8.0 cm anterior mediastinal mass.  Abdomen and pelvic CT on 01/16/2018 revealeda 6.3 x 5.6 cm soft tissue mass or medium density fluid filling the urinary bladder.There was a1.5 cm mildly complex lower pole right renal cyst.There was a1.9 cm indeterminate left adrenal mass.There was a small pericardial effusion. Renal ultrasoundon 01/25/2018 revealed no evidence of bladder mass. Finding on prior CT exam likely represented excretion of residual contrast material from a CTA chest exam performed 1 day prior   PET scanon 01/26/2018 revealed an 11.1 cm intensely hypermetabolic and partially necrotic right anterior mediastinal mass, compatible with malignancy. There was diffuse marrow hypermetabolism, nonspecific, cannot exclude an infiltrative neoplastic marrow process. There was no focal skeletal hypermetabolism. There was no focal bone lesions on the CT images. There was nonspecific mild heterogeneous prostatic hypermetabolism. There was a left adrenal adenoma, aortic atherosclerosis, and an ectatic 4.1 cm ascending  thoracic aorta.   Bone marrow biopsyon 02/03/2018 revealed a slightly hypercellular bone marrow for age with trilineage hematopoiesis. There was no monoclonal B-cell population or abnormal T-cell phenotype identified. Cytogenetics were normal (46, XY).  Echoon 01/15/2018 revealed an EF of 55-60%. Echoon 04/22/2018 revealed an EF of 50 to 55%. There was evidence of probablesevere hypokinesisof the mid apical-anteroseptal, anterior, and apical myocardium. Treadmill Myoviewon 04/26/2018 revealed  a low risk study with a small defect present in the apex location that was partially reversible and could represent small previous myocardial infarction. However, and apical thinning artifact could not be excluded. Nuclear stress EF: 50%. Echoon 05/17/2018 revealed an EF of 50-55%.Echoon 08/31/2018 revealed an ejection fraction of 40-45%.  Hepatitis B, hepatitis C, and HIV testing was negative on 02/10/2018. G6PD assay was normal.  He received 6 cycles of RCHOPchemotherapy (02/16/2018 - 06/10/2018) with Neulasta support. He received radiation(3600 cGy in 18 fractions) from 07/27/2018 - 08/19/2018.  Chest, abdomen, and pelvic CTon 04/19/2018 revealed the anterior mediastinal mass had significantly reduced in size (10.1 x 6.9 cm to 6.8 by 4.5 cm). The smaller left eccentric mediastinal lymph node measured 1.4 x 4.7 cm, previously 2.0 x 3.2 cm. There was no appreciable adenopathy in the abdomen/pelvis. There were bilateral hypodense renal lesions technically too small to characterize, including the right kidney lower pole lesion.   PET scanon 07/02/2018 revealed clear interval response to therapy with decrease in size and hypermetabolism of the anterior mediastinal mass (6.9 x 11.1 cm to 2 x 2.6 cm; SUV 14.3 to 5.3). There was a similar appearance of the diffuse marrow hypermetabolism, nonspecific. There was a 4.2 cm ascending thoracic aortic diameter. There was a stable left adrenal  adenoma.  He was diagnosed with T6-T7herpes zosteron 03/19/2018. He was treated with valacyclovir. He was on prophylactic valacyclovir.  PET scanon 11/18/2018 revealed continued decrease in metabolic activity of anterior mediastinal mass ([place] 3), no evidence of lymphoma progression on skull base to thigh, and new focus of metabolic airspace disease in the medialrightlower lobe favored focus of pulmonary infection (pneumonia).  Chest, abdomen and pelvis CT on 05/16/2019 revealed continued interval decrease in size of minimal residual anterior mediastinal soft tissue density (1.6 x 3.2 cm compared to 2.2 x 4.2 cm).  There were no new findings in the chest and no evidence of metastatic disease within the abdomen/pelvis. There were a small bilateral renal cysts. There was a stable 1.36 cm left adrenal nodule likely adenoma. There was a mild ectasia of the ascending thoracic aorta measuring 3.8 cm in AP diameter unchanged. Recommend annual imaging follow up by CTA or MRA.  Symptomatically, he is doing well.  He denies any fevers, sweats or weight loss.  Denies any adenopathy, bruising or bleeding.  He denies any symptoms of heart failure.  Plan: 1.   Labs today: CBC with diff, CMP, LDH, uric acid. 2.Diffuse large B cell lymphoma Clinically, he is doing well. Chest, abdomen, and pelvic CT scan on 05/16/2019 revealed no evidence of disease. Discuss plans for imaging in 6 months (10/2019).   Continue surveillance.[name] 3.Cardiac wall motion abnormalities Clinically, he denies any symptoms of CHF.  Echo on02/05/2018 revealed an EF of40-45%. Follow-up with Dr. Fletcher Anon in 09/2019. 4.Elevated bilirubin Bilirubin1.5 (direct 0.2) on 01/26/2019.             Bilirubin 1.4 on 08/30/2019. 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 5.Port-a-cath             Continue port flush every 6-12 weeks. 6.   Schedule chest, abdomen and pelvis CT 11/14/2019. 7.    Labs prior to scan (CBC with diff, CMP, LDH, uric acid). 8.   RTC after CT scan for MD assessment (in person), review of labs and imaging studies.  I discussed the assessment and treatment plan with the patient.  The patient was provided an opportunity to ask questions and all were answered.  The patient  agreed with the plan and demonstrated an understanding of the instructions.  The patient was advised to call back if the symptoms worsen or if the condition fails to improve as anticipated.   Lequita Asal, MD, PhD    08/31/2019, 10:37 AM

## 2019-08-30 ENCOUNTER — Encounter: Payer: Self-pay | Admitting: Hematology and Oncology

## 2019-08-30 ENCOUNTER — Other Ambulatory Visit: Payer: Self-pay

## 2019-08-30 ENCOUNTER — Inpatient Hospital Stay: Payer: Commercial Managed Care - PPO | Attending: Hematology and Oncology

## 2019-08-30 DIAGNOSIS — C8332 Diffuse large B-cell lymphoma, intrathoracic lymph nodes: Secondary | ICD-10-CM | POA: Diagnosis not present

## 2019-08-30 DIAGNOSIS — C8510 Unspecified B-cell lymphoma, unspecified site: Secondary | ICD-10-CM

## 2019-08-30 DIAGNOSIS — Z7189 Other specified counseling: Secondary | ICD-10-CM

## 2019-08-30 DIAGNOSIS — R931 Abnormal findings on diagnostic imaging of heart and coronary circulation: Secondary | ICD-10-CM

## 2019-08-30 LAB — CBC WITH DIFFERENTIAL/PLATELET
Abs Immature Granulocytes: 0.05 10*3/uL (ref 0.00–0.07)
Basophils Absolute: 0.1 10*3/uL (ref 0.0–0.1)
Basophils Relative: 1 %
Eosinophils Absolute: 0.2 10*3/uL (ref 0.0–0.5)
Eosinophils Relative: 2 %
HCT: 47.6 % (ref 39.0–52.0)
Hemoglobin: 16.6 g/dL (ref 13.0–17.0)
Immature Granulocytes: 1 %
Lymphocytes Relative: 12 %
Lymphs Abs: 1 10*3/uL (ref 0.7–4.0)
MCH: 29.8 pg (ref 26.0–34.0)
MCHC: 34.9 g/dL (ref 30.0–36.0)
MCV: 85.5 fL (ref 80.0–100.0)
Monocytes Absolute: 0.7 10*3/uL (ref 0.1–1.0)
Monocytes Relative: 8 %
Neutro Abs: 6.4 10*3/uL (ref 1.7–7.7)
Neutrophils Relative %: 76 %
Platelets: 207 10*3/uL (ref 150–400)
RBC: 5.57 MIL/uL (ref 4.22–5.81)
RDW: 14.6 % (ref 11.5–15.5)
WBC: 8.3 10*3/uL (ref 4.0–10.5)
nRBC: 0 % (ref 0.0–0.2)

## 2019-08-30 LAB — COMPREHENSIVE METABOLIC PANEL
ALT: 27 U/L (ref 0–44)
AST: 20 U/L (ref 15–41)
Albumin: 4 g/dL (ref 3.5–5.0)
Alkaline Phosphatase: 61 U/L (ref 38–126)
Anion gap: 8 (ref 5–15)
BUN: 17 mg/dL (ref 6–20)
CO2: 25 mmol/L (ref 22–32)
Calcium: 9.3 mg/dL (ref 8.9–10.3)
Chloride: 102 mmol/L (ref 98–111)
Creatinine, Ser: 0.95 mg/dL (ref 0.61–1.24)
GFR calc Af Amer: >60 mL/min (ref >60)
GFR calc non Af Amer: >60 mL/min (ref >60)
Glucose, Bld: 91 mg/dL (ref 70–99)
Potassium: 4 mmol/L (ref 3.5–5.1)
Sodium: 135 mmol/L (ref 135–145)
Total Bilirubin: 1.4 mg/dL — ABNORMAL HIGH (ref 0.3–1.2)
Total Protein: 6.7 g/dL (ref 6.5–8.1)

## 2019-08-30 LAB — LACTATE DEHYDROGENASE: LDH: 136 U/L (ref 98–192)

## 2019-08-30 LAB — URIC ACID: Uric Acid, Serum: 7.2 mg/dL (ref 3.7–8.6)

## 2019-08-30 MED ORDER — SODIUM CHLORIDE 0.9% FLUSH
10.0000 mL | INTRAVENOUS | Status: DC | PRN
  Administered 2019-08-30: 10 mL via INTRAVENOUS
  Filled 2019-08-30: qty 10

## 2019-08-30 MED ORDER — HEPARIN SOD (PORK) LOCK FLUSH 100 UNIT/ML IV SOLN
500.0000 [IU] | Freq: Once | INTRAVENOUS | Status: AC
  Administered 2019-08-30: 11:00:00 500 [IU] via INTRAVENOUS
  Filled 2019-08-30: qty 5

## 2019-08-30 NOTE — Progress Notes (Signed)
Confirmed patient name and DOB for pre appointment assessment. Patient reports no concerns at this time, he states he is feeling good

## 2019-08-31 ENCOUNTER — Other Ambulatory Visit: Payer: Commercial Managed Care - PPO

## 2019-08-31 ENCOUNTER — Inpatient Hospital Stay: Payer: Commercial Managed Care - PPO | Attending: Hematology and Oncology | Admitting: Hematology and Oncology

## 2019-08-31 DIAGNOSIS — T451X5A Adverse effect of antineoplastic and immunosuppressive drugs, initial encounter: Secondary | ICD-10-CM

## 2019-08-31 DIAGNOSIS — C8580 Other specified types of non-Hodgkin lymphoma, unspecified site: Secondary | ICD-10-CM

## 2019-08-31 DIAGNOSIS — R17 Unspecified jaundice: Secondary | ICD-10-CM | POA: Diagnosis not present

## 2019-08-31 DIAGNOSIS — I427 Cardiomyopathy due to drug and external agent: Secondary | ICD-10-CM | POA: Diagnosis not present

## 2019-08-31 DIAGNOSIS — Z95828 Presence of other vascular implants and grafts: Secondary | ICD-10-CM

## 2019-09-12 NOTE — Progress Notes (Signed)
Cardiology Office Note    Date:  09/21/2019   ID:  Ronnie Mcintyre, DOB 1966-05-17, MRN [id number]  PCP:  [name], MD  Cardiologist:  [name], MD  Electrophysiologist:  None   Chief Complaint: Follow up  History of Present Illness:   Ronnie Mcintyre is a 54 y.o. 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   He was diagnosed with large B-cell lymphoma in 12/2017 and underwent chemotherapy with RCHOP. Echo in 12/2017, pre-chemotherapy, showed an EF of 55-60% with no RWMA and a mildly dilated aortic root at 40 mm. Repeat echo after his third cycle of chemotherapy in 04/2018 showed an EF of 50-55% with probable severe hypokinesis of the mid to distal anteroseptal, anterior, and apical myocardium. Treadmill Myoview in 04/2018, showed the patient was able to exercise for 4 minutes and 45 seconds with perfusion being sub optimal overall, with a small apical defect felt to be likely secondary to apical thinning, although a small infarct could not be excluded. EF 50%. Overall, this was a low risk study.  He has also undergone extensive radiation complicated by coughing and SOB that slowly improved. Following discontinuation of HCTZ and titration of Coreg, repeat echo in 08/2018 showed an EF of 40-45% with the patient noting improvement in symptoms. He was last seen virtually in 11/2018, and was able to walk 3 miles per day. He was continued on Coreg and telmisartan.   He comes in doing well from a cardiac perspective.  He denies any chest discomfort, dyspnea, palpitations, dizziness, presyncope, syncope.  No lower extremity swelling or orthopnea.  He notes the winter months are quite busy at work and in this setting he has not been able to find the time to continue his walking regimen at home.  As his work begins to slow down heading into the spring months he looks  forward to resuming this exercise regimen.  That said, he still consistently gets 18,000 steps in per day.  He does note some split-second 1-2 times per month occurring sharp pain going down the left arm to the tips of his fingers.  He has not necessarily noted a specific correlation when this occurs.  He is tolerating carvedilol and telmisartan without issues.  He notes his strength and functional status continue to improve following chemoradiation.  He is scheduled for fasting blood work with his PCP next week.   Labs independently reviewed: 08/2019 - HGB 16.6, PLT 207, potassium 4.0, BUN 17, SCr 0.95, albumin 4.0, AST/ALT normal 03/2019 - TC 153, TG 125, HDL 24, LDL 93 09/2018 - A1c 4.8 08/2018 - TSH normal 06/2018 - magnesium 1.8  Past Medical History:  Diagnosis Date  . Cancer ([place])   . Hypertension     Past Surgical History:  Procedure Laterality Date  . ANKLE SURGERY Right   . PORTACATH PLACEMENT Right 02/11/2018   Procedure: INSERTION PORT-A-CATH;  Surgeon: Jules Husbands, MD;  Location: [facility] ORS;  Service: General;  Laterality: Right;    Current Medications: Current Meds  Medication Sig  . acetaminophen (TYLENOL) 325 MG tablet Take 2 tablets (650 mg total) by mouth every 6 (six) hours as needed for mild pain (or Fever >/= 101).  [name] aspirin 81 MG tablet Take 81 mg by mouth daily.   . carvedilol (COREG) 6.25 MG tablet TAKE 1 TABLET (6.25 MG TOTAL) BY MOUTH 2 (TWO)  TIMES DAILY WITH A MEAL.  . simvastatin (ZOCOR) 40 MG tablet Take 40 mg by mouth daily.  [name] telmisartan (MICARDIS) 80 MG tablet Take by mouth.    Allergies:   Patient has no known allergies.   Social History   Socioeconomic History  . Marital status: Single    Spouse name: Not on file  . Number of children: Not on file  . Years of education: Not on file  . Highest education level: Not on file  Occupational History  . Not on file  Tobacco Use  . Smoking status: Never Smoker  . Smokeless tobacco: Never Used    Substance and Sexual Activity  . Alcohol use: Yes    Comment: rarely  . Drug use: No  . Sexual activity: Not on file  Other Topics Concern  . Not on file  Social History Narrative  . Not on file   Social Determinants of Health   Financial Resource Strain:   . Difficulty of Paying Living Expenses: Not on file  Food Insecurity:   . Worried About Charity fundraiser in the Last Year: Not on file  . Ran Out of Food in the Last Year: Not on file  Transportation Needs:   . Lack of Transportation (Medical): Not on file  . Lack of Transportation (Non-Medical): Not on file  Physical Activity:   . Days of Exercise per Week: Not on file  . Minutes of Exercise per Session: Not on file  Stress:   . Feeling of Stress : Not on file  Social Connections:   . Frequency of Communication with Friends and Family: Not on file  . Frequency of Social Gatherings with Friends and Family: Not on file  . Attends Religious Services: Not on file  . Active Member of Clubs or Organizations: Not on file  . Attends Archivist Meetings: Not on file  . Marital Status: Not on file     Family History:  The patient's family history includes Hypertension in his father and mother; Multiple myeloma in his father.  ROS:   Review of Systems  Constitutional: Positive for malaise/fatigue. Negative for chills, diaphoresis, fever and weight loss.  HENT: Negative for congestion.   Eyes: Negative for discharge and redness.  Respiratory: Negative for cough, sputum production, shortness of breath and wheezing.   Cardiovascular: Negative for chest pain, palpitations, orthopnea, claudication, leg swelling and PND.  Gastrointestinal: Negative for abdominal pain, heartburn, nausea and vomiting.  Musculoskeletal: Positive for myalgias. Negative for falls.       Split second sharp left arm pain  Skin: Negative for rash.  Neurological: Negative for dizziness, tingling, tremors, sensory change, speech change, focal  weakness, loss of consciousness and weakness.  Endo/Heme/Allergies: Does not bruise/bleed easily.  Psychiatric/Behavioral: Negative for substance abuse. The patient is not nervous/anxious.      EKGs/Labs/Other Studies Reviewed:    Studies reviewed were summarized above. The additional studies were reviewed today:  08/2018: 1. The left ventricle has mild-moderately reduced systolic function of  92-42%. The cavity size was normal. There is mildly increased left  ventricular wall thickness. Left ventricular diastology could not be  evaluated Left ventricular diffuse  hypokinesis.  2. The right ventricle has normal systolic function. The cavity was  normal. There is no increase in right ventricular wall thickness. Right  ventricular systolic pressure could not be assessed.  3. The mitral valve is normal in structure.  4. The tricuspid valve is normal in structure.  5. The  aortic valve is tricuspid.  6. The pulmonic valve was normal in structure.  7. There is mild dilatation of the aortic root, ascending aorta and  transverse aorta.  8. The interatrial septum was not well visualized.  __________  Nuclear MPI 04/2018:  Blood pressure demonstrated a normal response to exercise. Exercise duration of 4 minutes and 45 seconds. Nondiagnostic EKG due to left anterior fascicular block.  Defect 1: There is a small defect present in the apex location. This defect is partially reversible and could represent small previous myocardial infarction. However, and apical thinning artifact cannot be excluded  This is a low risk study.  Nuclear stress EF: 50%.   EKG:  EKG is ordered today.  The EKG ordered today demonstrates NSR, 86 bpm, left axis deviation left anterior fascicular block, poor R wave progression along the precordial leads, rare PVC possible old inferior and anteroseptal infarct.  When compared to prior study, no significant ST-T changes with improved PVC burden.  Recent  Labs: 08/30/2019: ALT 27; BUN 17; Creatinine, Ser 0.95; Hemoglobin 16.6; Platelets 207; Potassium 4.0; Sodium 135  Recent Lipid Panel No results found for: CHOL, TRIG, HDL, CHOLHDL, VLDL, LDLCALC, LDLDIRECT  PHYSICAL EXAM:    VS:  BP 112/76 (BP Location: Left Arm, Patient Position: Sitting, Cuff Size: Normal)   Pulse 86   Ht 6' (1.829 m)   Wt 257 lb (116.6 kg)   SpO2 97%   BMI 34.86 kg/m   BMI: Body mass index is 34.86 kg/m.  Physical Exam  Constitutional: He is oriented to person, place, and time. He appears well-developed and well-nourished.  HENT:  Head: Normocephalic and atraumatic.  Eyes: Right eye exhibits no discharge. Left eye exhibits no discharge.  Neck: No JVD present.  Cardiovascular: Normal rate, regular rhythm, S1 normal, S2 normal and normal heart sounds. Exam reveals no distant heart sounds, no friction rub, no midsystolic click and no opening snap.  No murmur heard. Pulses:      Posterior tibial pulses are 2+ on the right side and 2+ on the left side.  Pulmonary/Chest: Effort normal and breath sounds normal. No respiratory distress. He has no decreased breath sounds. He has no wheezes. He has no rales. He exhibits no tenderness.  Abdominal: Soft. He exhibits no distension. There is no abdominal tenderness.  Musculoskeletal:        General: No edema.     Cervical back: Normal range of motion.  Neurological: He is alert and oriented to person, place, and time.  Skin: Skin is warm and dry. No cyanosis. Nails show no clubbing.  Psychiatric: He has a normal mood and affect. His speech is normal and behavior is normal. Judgment and thought content normal.    Wt Readings from Last 3 Encounters:  09/21/19 257 lb (116.6 kg)  05/18/19 254 lb 6.6 oz (115.4 kg)  02/14/19 251 lb 14.4 oz (114.3 kg)     ASSESSMENT & PLAN:   1. 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  Schedule echo now that he is done with chemotherapy.  If  his EF has trended to less than 40% plan to discontinue telmisartan and initiate Entresto followed by possible addition of spironolactone with continuation of Coreg.  If EF has improved plan will be to continue carvedilol and telmisartan.  He is not requiring standing diuretic therapy.  He does note a split second 1-2 times per month randomly occurring sharp pain down the left arm.  This appears  to be atypical in etiology.  Plan for echo as outlined above.  If his cardiomyopathy persists or is worsened we may need to revisit noninvasive ischemic evaluation with possible coronary CTA.  However, I would prefer to avoid further radiation if possible.  2. Occasional PVCs.  Asymptomatic.  Update echo as outlined above.  Remains on carvedilol.  3. HTN: Blood pressure is well controlled.  Continue carvedilol and telmisartan.  4. HLD: Remains on simvastatin.  Followed by PCP.  Disposition: F/u with Dr. Fletcher Anon or an APP in 2 months.   Medication Adjustments/Labs and Tests Ordered: Current medicines are reviewed at length with the patient today.  Concerns regarding medicines are outlined above. Medication changes, Labs and Tests ordered today are summarized above and listed in the Patient Instructions accessible in Encounters.   Signed, Christell Faith, PA-C 09/21/2019 2:00 PM     [address] [place], [place] [address] [phone]

## 2019-09-21 ENCOUNTER — Ambulatory Visit: Payer: Commercial Managed Care - PPO | Admitting: Physician Assistant

## 2019-09-21 ENCOUNTER — Other Ambulatory Visit: Payer: Self-pay

## 2019-09-21 ENCOUNTER — Encounter: Payer: Self-pay | Admitting: Physician Assistant

## 2019-09-21 VITALS — BP 112/76 | HR 86 | Ht 72.0 in | Wt 257.0 lb

## 2019-09-21 DIAGNOSIS — E785 Hyperlipidemia, unspecified: Secondary | ICD-10-CM

## 2019-09-21 DIAGNOSIS — I5022 Chronic systolic (congestive) heart failure: Secondary | ICD-10-CM

## 2019-09-21 DIAGNOSIS — T451X5A Adverse effect of antineoplastic and immunosuppressive drugs, initial encounter: Secondary | ICD-10-CM

## 2019-09-21 DIAGNOSIS — I427 Cardiomyopathy due to drug and external agent: Secondary | ICD-10-CM | POA: Diagnosis not present

## 2019-09-21 DIAGNOSIS — I493 Ventricular premature depolarization: Secondary | ICD-10-CM | POA: Diagnosis not present

## 2019-09-21 DIAGNOSIS — I10 Essential (primary) hypertension: Secondary | ICD-10-CM | POA: Diagnosis not present

## 2019-09-21 NOTE — Patient Instructions (Signed)
Medication Instructions:  Your physician recommends that you continue on your current medications as directed. Please refer to the Current Medication list given to you today.  *If you need a refill on your cardiac medications before your next appointment, please call your pharmacy*   Lab Work: none If you have labs (blood work) drawn today and your tests are completely normal, you will receive your results only by: [name] MyChart Message (if you have MyChart) OR . A paper copy in the mail If you have any lab test that is abnormal or we need to change your treatment, we will call you to review the results.  Testing/Procedures: Your physician has requested that you have an echocardiogram. Echocardiography is a painless test that uses sound waves to create images of your heart. It provides your doctor with information about the size and shape of your heart and how well your heart's chambers and valves are working. This procedure takes approximately one hour. There are no restrictions for this procedure. You may get an IV, if needed, to receive an ultrasound enhancing agent through to better visualize your heart.   Follow-Up: At [facility], you and your health needs are our priority.  As part of our continuing mission to provide you with exceptional heart care, we have created designated Provider Care Teams.  These Care Teams include your primary Cardiologist (physician) and Advanced Practice Providers (APPs -  Physician Assistants and Nurse Practitioners) who all work together to provide you with the care you need, when you need it.  We recommend signing up for the patient portal called "MyChart".  Sign up information is provided on this After Visit Summary.  MyChart is used to connect with patients for Virtual Visits (Telemedicine).  Patients are able to view lab/test results, encounter notes, upcoming appointments, etc.  Non-urgent messages can be sent to your provider as well.   To learn more  about what you can do with MyChart, go to [email or web address].    Your next appointment:   3 month(s)  The format for your next appointment:   In Person  Provider:    You may see [name], MD or Christell Faith, PA-C   Echocardiogram An echocardiogram is a procedure that uses painless sound waves (ultrasound) to produce an image of the heart. Images from an echocardiogram can provide important information about:  Signs of coronary artery disease (CAD).  Aneurysm detection. An aneurysm is a weak or damaged part of an artery wall that bulges out from the normal force of blood pumping through the body.  Heart size and shape. Changes in the size or shape of the heart can be associated with certain conditions, including heart failure, aneurysm, and CAD.  Heart muscle function.  Heart valve function.  Signs of a past heart attack.  Fluid buildup around the heart.  Thickening of the heart muscle.  A tumor or infectious growth around the heart valves. Tell a health care provider about:  Any allergies you have.  All medicines you are taking, including vitamins, herbs, eye drops, creams, and over-the-counter medicines.  Any blood disorders you have.  Any surgeries you have had.  Any medical conditions you have.  Whether you are pregnant or may be pregnant. What are the risks? Generally, this is a safe procedure. However, problems may occur, including:  Allergic reaction to dye (contrast) that may be used during the procedure. What happens before the procedure? No specific preparation is needed. You may eat and drink normally. What happens  during the procedure?   An IV tube may be inserted into one of your veins.  You may receive contrast through this tube. A contrast is an injection that improves the quality of the pictures from your heart.  A gel will be applied to your chest.  A wand-like tool (transducer) will be moved over your chest. The gel will help to  transmit the sound waves from the transducer.  The sound waves will harmlessly bounce off of your heart to allow the heart images to be captured in real-time motion. The images will be recorded on a computer. The procedure may vary among health care providers and hospitals. What happens after the procedure?  You may return to your normal, everyday life, including diet, activities, and medicines, unless your health care provider tells you not to do that. Summary  An echocardiogram is a procedure that uses painless sound waves (ultrasound) to produce an image of the heart.  Images from an echocardiogram can provide important information about the size and shape of your heart, heart muscle function, heart valve function, and fluid buildup around your heart.  You do not need to do anything to prepare before this procedure. You may eat and drink normally.  After the echocardiogram is completed, you may return to your normal, everyday life, unless your health care provider tells you not to do that. This information is not intended to replace advice given to you by your health care provider. Make sure you discuss any questions you have with your health care provider. Document Revised: 10/28/2018 Document Reviewed: 08/09/2016 Elsevier Patient Education  [place].

## 2019-10-27 ENCOUNTER — Ambulatory Visit (INDEPENDENT_AMBULATORY_CARE_PROVIDER_SITE_OTHER): Payer: Commercial Managed Care - PPO

## 2019-10-27 ENCOUNTER — Other Ambulatory Visit: Payer: Self-pay

## 2019-10-27 DIAGNOSIS — I5022 Chronic systolic (congestive) heart failure: Secondary | ICD-10-CM

## 2019-10-28 ENCOUNTER — Telehealth: Payer: Self-pay

## 2019-10-28 DIAGNOSIS — I5022 Chronic systolic (congestive) heart failure: Secondary | ICD-10-CM

## 2019-10-28 MED ORDER — SPIRONOLACTONE 25 MG PO TABS: 12.5000 mg | ORAL_TABLET | Freq: Every day | ORAL | 3 refills | Status: DC

## 2019-10-28 NOTE — Telephone Encounter (Signed)
Call to patient to review labs.    Pt verbalized understanding and has no further questions at this time.    Advised pt to call for any further questions or concerns.  Discussed POC and orders placed.   Confirmed upcoming appt in June.

## 2019-10-28 NOTE — Telephone Encounter (Signed)
-----   Message from Rise Mu, [place] sent at 10/27/2019  3:12 PM EDT ----- Post chemotherapy echo demonstrated an EF of 40 to 45%, slightly stiffened heart, mildly dilated aortic root and ascending aorta, no significant valvular abnormalities.  When compared to prior echo from 08/2018, his pump function is unchanged and the dilated aortic root/ascending aorta are stable.  Recommendations: -His cardiomyopathy has been felt to be nonischemic secondary to underlying chemotherapy with low risk stress test in 2019 -We can follow his dilated aortic root/ascending aorta with echo/CT/MR periodically though would prefer to avoid further radiation with CT given his history -Add spironolactone 12.5 mg daily -Recheck BMET 1 week after starting spironolactone  -Follow up as planned in 12/2019 -Discussed with Dr. Fletcher Anon

## 2019-11-10 ENCOUNTER — Other Ambulatory Visit: Payer: Self-pay | Admitting: Cardiovascular Disease

## 2019-12-08 ENCOUNTER — Other Ambulatory Visit: Payer: Self-pay

## 2019-12-08 DIAGNOSIS — C8580 Other specified types of non-Hodgkin lymphoma, unspecified site: Secondary | ICD-10-CM

## 2019-12-12 ENCOUNTER — Inpatient Hospital Stay: Payer: Commercial Managed Care - PPO | Attending: Hematology and Oncology

## 2019-12-12 ENCOUNTER — Other Ambulatory Visit: Payer: Self-pay

## 2019-12-12 DIAGNOSIS — C8580 Other specified types of non-Hodgkin lymphoma, unspecified site: Secondary | ICD-10-CM

## 2019-12-12 DIAGNOSIS — I427 Cardiomyopathy due to drug and external agent: Secondary | ICD-10-CM

## 2019-12-12 DIAGNOSIS — R17 Unspecified jaundice: Secondary | ICD-10-CM

## 2019-12-12 DIAGNOSIS — C8522 Mediastinal (thymic) large B-cell lymphoma, intrathoracic lymph nodes: Secondary | ICD-10-CM | POA: Diagnosis present

## 2019-12-12 LAB — CBC WITH DIFFERENTIAL/PLATELET
Abs Immature Granulocytes: 0.04 10*3/uL (ref 0.00–0.07)
Basophils Absolute: 0 10*3/uL (ref 0.0–0.1)
Basophils Relative: 1 %
Eosinophils Absolute: 0.2 10*3/uL (ref 0.0–0.5)
Eosinophils Relative: 3 %
HCT: 47.6 % (ref 39.0–52.0)
Hemoglobin: 16.3 g/dL (ref 13.0–17.0)
Immature Granulocytes: 1 %
Lymphocytes Relative: 9 %
Lymphs Abs: 0.7 10*3/uL (ref 0.7–4.0)
MCH: 29.5 pg (ref 26.0–34.0)
MCHC: 34.2 g/dL (ref 30.0–36.0)
MCV: 86.2 fL (ref 80.0–100.0)
Monocytes Absolute: 0.5 10*3/uL (ref 0.1–1.0)
Monocytes Relative: 7 %
Neutro Abs: 6.6 10*3/uL (ref 1.7–7.7)
Neutrophils Relative %: 79 %
Platelets: 185 10*3/uL (ref 150–400)
RBC: 5.52 MIL/uL (ref 4.22–5.81)
RDW: 15 % (ref 11.5–15.5)
WBC: 8.1 10*3/uL (ref 4.0–10.5)
nRBC: 0 % (ref 0.0–0.2)

## 2019-12-12 LAB — COMPREHENSIVE METABOLIC PANEL
ALT: 31 U/L (ref 0–44)
AST: 20 U/L (ref 15–41)
Albumin: 4.1 g/dL (ref 3.5–5.0)
Alkaline Phosphatase: 60 U/L (ref 38–126)
Anion gap: 7 (ref 5–15)
BUN: 22 mg/dL — ABNORMAL HIGH (ref 6–20)
CO2: 27 mmol/L (ref 22–32)
Calcium: 8.9 mg/dL (ref 8.9–10.3)
Chloride: 104 mmol/L (ref 98–111)
Creatinine, Ser: 1.21 mg/dL (ref 0.61–1.24)
GFR calc Af Amer: >60 mL/min (ref >60)
GFR calc non Af Amer: >60 mL/min (ref >60)
Glucose, Bld: 112 mg/dL — ABNORMAL HIGH (ref 70–99)
Potassium: 4.4 mmol/L (ref 3.5–5.1)
Sodium: 138 mmol/L (ref 135–145)
Total Bilirubin: 1.1 mg/dL (ref 0.3–1.2)
Total Protein: 6.8 g/dL (ref 6.5–8.1)

## 2019-12-12 LAB — LACTATE DEHYDROGENASE: LDH: 144 U/L (ref 98–192)

## 2019-12-12 LAB — URIC ACID: Uric Acid, Serum: 8.3 mg/dL (ref 3.7–8.6)

## 2019-12-12 MED ORDER — SODIUM CHLORIDE 0.9% FLUSH
10.0000 mL | INTRAVENOUS | Status: DC | PRN
  Administered 2019-12-12: 10 mL via INTRAVENOUS
  Filled 2019-12-12: qty 10

## 2019-12-12 MED ORDER — HEPARIN SOD (PORK) LOCK FLUSH 100 UNIT/ML IV SOLN
500.0000 [IU] | Freq: Once | INTRAVENOUS | Status: AC
  Administered 2019-12-12: 500 [IU] via INTRAVENOUS
  Filled 2019-12-12: qty 5

## 2019-12-13 NOTE — Progress Notes (Signed)
[facility]  [address] [place], [place] [address] Phone: [phone]  Fax: [phone]   Clinic Day:  12/15/2019  Referring physician: [name], MD  Chief Complaint: Ronnie Mcintyre is a 54 y.o. male with IBlarge B-cell lymphoma who is seen for 3 month assessment.    HPI: The patient was last seen in the medical oncology clinic on 08/31/2019 for a telemedicine visit. At that time, he was doing well.  He denied any fevers, sweats or weight loss.  He denied any adenopathy, bruising or bleeding. He denied any symptoms of heart failure. Labs were normal. Surveillance continued.   Patient saw Christell Faith, PA in the [facility] clinic on 09/21/2019. His strength and functional status continued to improve following chemoradiation. He denied any chest discomfort, dyspnea, palpitations, dizziness, presyncope, syncope. He denied any lower extremity swelling or orthopnea. Patient was advised to follow up with Dr. Fletcher Anon in 2 months.   Echo on 10/27/2019 revealed an EF of 40-45%.  Labs on 12/12/2019 included hematocrit 47.6, hemoglobin 16.3, platelets 185,000, WBC 8,100. CMP was normal. LDH was 144. Uric acid was 8.3 (normal).   Chest, abdomen and pelvis CT on 12/14/2019 revealed continued interval decrease in size of the anterior mediastinal soft tissue, likely treated tumor. There were no new or progressive findings.  There was no new areas of lymphadenopathy in the chest, abdomen or pelvis.  There was stable age advanced three-vessel coronary artery calcifications and a stable small left adrenal gland adenoma.  During the interim, he has been doing well. He denies waking up short of breath. He does not have to prop his head up on multiple pillows at night. For the past 2 weeks he has felt like there is something in his throat around the sternal notch. He denies any pain but feels the discomfort may be from a cough he developed s/p radiation. After clearing his  throat he feels fine. Patient questioned port removal. I noted if the next scan is good, then we can consider port removal.   Since his job is short staffed he is working a lot. Patient notes he will get his COVID-19 vaccine soon. I recommended the pfizer or moderna.    Past Medical History:  Diagnosis Date  . Cancer ([place])   . Hypertension     Past Surgical History:  Procedure Laterality Date  . ANKLE SURGERY Right   . PORTACATH PLACEMENT Right 02/11/2018   Procedure: INSERTION PORT-A-CATH;  Surgeon: Jules Husbands, MD;  Location: [facility] ORS;  Service: General;  Laterality: Right;    Family History  Problem Relation Age of Onset  . Hypertension Mother   . Hypertension Father   . Multiple myeloma Father     Social History:  reports that he has never smoked. He has never used smokeless tobacco. He reports current alcohol use. He reports that he does not use drugs. He drinks beer 1-2x/week.He is walkingfor an hour, about2 miles/day.He works for [employer], about 9-10 hours/day 6 days a week. He lives in [place] The patient is alone today.   Allergies: No Known Allergies  Current Medications: Current Outpatient Medications  Medication Sig Dispense Refill  . acetaminophen (TYLENOL) 325 MG tablet Take 2 tablets (650 mg total) by mouth every 6 (six) hours as needed for mild pain (or Fever >/= 101).    [name] aspirin 81 MG tablet Take 81 mg by mouth daily.     . carvedilol (COREG) 6.25 MG tablet Take  1 tablet (6.25 mg total) by mouth 2 (two) times daily with a meal. 180 tablet 0  . simvastatin (ZOCOR) 40 MG tablet Take 40 mg by mouth daily.    [name] spironolactone (ALDACTONE) 25 MG tablet Take 0.5 tablets (12.5 mg total) by mouth daily. 45 tablet 3  . telmisartan (MICARDIS) 80 MG tablet Take by mouth.     No current facility-administered medications for this visit.    Review of Systems  Constitutional: Negative for chills, diaphoresis, fever, malaise/fatigue and weight loss (up  6 lbs).       Doing good.  HENT: Negative.  Negative for congestion, ear pain, hearing loss, nosebleeds, sinus pain, sore throat and tinnitus.        Discomfort in throat x 2 weeks; feels fine after clearing throat.  Eyes: Negative.  Negative for blurred vision, double vision and photophobia.  Respiratory: Positive for cough (s/p radiation). Negative for hemoptysis, sputum production, shortness of breath and wheezing.   Cardiovascular: Negative.  Negative for chest pain, palpitations, orthopnea, leg swelling and PND.  Gastrointestinal: Negative.  Negative for abdominal pain, blood in stool, constipation, diarrhea, heartburn, nausea and vomiting.  Genitourinary: Negative.  Negative for dysuria, frequency, hematuria and urgency.  Musculoskeletal: Negative.  Negative for back pain, joint pain, myalgias and neck pain.  Skin: Negative.  Negative for itching and rash.  Neurological: Negative.  Negative for dizziness, tingling, sensory change, speech change, focal weakness, weakness and headaches.  Endo/Heme/Allergies: Negative.  Does not bruise/bleed easily.  Psychiatric/Behavioral: Negative.  Negative for depression and memory loss. The patient is not nervous/anxious and does not have insomnia.   All other systems reviewed and are negative.  Performance status (ECOG):  0  Vitals Blood pressure 103/80, pulse 83, temperature (!) 94.8 F (34.9 C), temperature source Tympanic, resp. rate 16, weight 260 lb 11.1 oz (118.2 kg), SpO2 99 %.   Physical Exam  Constitutional: He is oriented to person, place, and time. He appears well-developed and well-nourished. No distress.  HENT:  Head: Normocephalic and atraumatic.  Mouth/Throat: Oropharynx is clear and moist.  Cap.  Short gray hair.  Mask.  Eyes: Pupils are equal, round, and reactive to light. Conjunctivae and EOM are normal. Right eye exhibits no chemosis. No scleral icterus.  Blue eyes.  Neck: No JVD present.  Cardiovascular: Normal rate,  regular rhythm and normal heart sounds.  No murmur heard. Pulmonary/Chest: Effort normal and breath sounds normal. No respiratory distress. He has no wheezes. He has no rales. He exhibits no tenderness.  Abdominal: Soft. Bowel sounds are normal. He exhibits no distension and no mass. There is no abdominal tenderness. There is no rebound and no guarding.  Musculoskeletal:        General: No tenderness or edema. Normal range of motion.     Cervical back: Normal range of motion and neck supple.  Lymphadenopathy:    He has no cervical adenopathy.    He has no axillary adenopathy.       Right: No supraclavicular adenopathy present.       Left: No supraclavicular adenopathy present.  Neurological: He is alert and oriented to person, place, and time.  Skin: Skin is warm and dry. He is not diaphoretic.  Psychiatric: He has a normal mood and affect. His behavior is normal. Judgment and thought content normal.  Nursing note and vitals reviewed.   Imaging studies: 01/15/2018:  Chest CT angiogramrevealed an 11.3 x 8.0 cm anterior mediastinal mass. 01/16/2018:  Abdomen and pelvic CT on  01/16/2018 revealeda 6.3 x 5.6 cm soft tissue mass or medium density fluid filling the urinary bladder.There was a1.5 cm mildly complex lower pole right renal cyst.There was a1.9 cm indeterminate left adrenal mass.There was a small pericardial effusion.  01/25/2018:  Renal ultrasoundrevealed no evidence of bladder mass. Finding on prior CT exam likely represented excretion of residual contrast material from a CTA chest exam performed 1 day prior  01/26/2018:  PET scanrevealed an 11.1 cm intensely hypermetabolic and partially necrotic right anterior mediastinal mass, compatible with malignancy. There was diffuse marrow hypermetabolism, nonspecific, cannot exclude an infiltrative neoplastic marrow process. There was no focal skeletal hypermetabolism. There was no focal bone lesions on the CT images. There was  nonspecific mild heterogeneous prostatic hypermetabolism. 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  04/19/2018:  Chest, abdomen, and pelvic CTrevealed the anterior mediastinal mass had significantly reduced in size (10.1 x 6.9 cm to 6.8 by 4.5 cm). The smaller left eccentric mediastinal lymph node measured 1.4 x 4.7 cm, previously 2.0 x 3.2 cm. There was no appreciable adenopathy in the abdomen/pelvis. There were bilateral hypodense renal lesions technically too small to characterize, including the right kidney lower pole lesion.  07/02/2018:  PET scanrevealed clear interval response to therapy with decrease in size and hypermetabolism of the anterior mediastinal mass (6.9 x 11.1 cm to 2 x 2.6 cm; SUV 14.3 to 5.3). There was a similar appearance of the diffuse marrow hypermetabolism, nonspecific. There was a 4.2 cm ascending thoracic aortic diameter. There was a stable left adrenal adenoma. 11/18/2018:  PET scanrevealed continued decrease in metabolic activity of anterior mediastinal mass ([place] 3), no evidence of lymphoma progression on skull base to thigh, and new focus of metabolic airspace disease in the medialrightlower lobe favored focus of pulmonary infection (pneumonia). 05/16/2019:  Chest, abdomen and pelvis CT revealed continued interval decrease in size of minimal residual anterior mediastinal soft tissue density (1.6 x 3.2 cm compared to 2.2 x 4.2 cm).  There were no new findings in the chest and no evidence of metastatic disease within the abdomen/pelvis. There were a small bilateral renal cysts. There was a stable 1.36 cm left adrenal nodule likely adenoma. There was a mild ectasia of the ascending thoracic aorta measuring 3.8 cm in AP diameter unchanged. Recommend annual imaging follow up by CTA or MRA. 11/24/2019:  Chest, abdomen and pelvis CT revealed continued interval decrease in size of the anterior mediastinal  soft tissue, likely treated tumor. There were no new or progressive findings.  There was no new areas of lymphadenopathy in the chest, abdomen or pelvis.  There was stable age advanced three-vessel coronary artery calcifications and a stable small left adrenal gland adenoma.   No visits with results within 3 Day(s) from this visit.  Latest known visit with results is:  Infusion on 12/12/2019  Component Date Value Ref Range Status  . Uric Acid, Serum 12/12/2019 8.3  3.7 - 8.6 mg/dL Final   Performed at [facility], [address], [place], [place] [address]  . LDH 12/12/2019 144  98 - 192 U/L Final   Performed at [facility], [address] [place],  [address]  . Sodium 12/12/2019 138  135 - 145 mmol/L Final  . Potassium 12/12/2019 4.4  3.5 - 5.1 mmol/L Final  . Chloride 12/12/2019 104  98 - 111 mmol/L Final  . CO2 12/12/2019 27  22 - 32 mmol/L Final  .  Glucose, Bld 12/12/2019 112* 70 - 99 mg/dL Final   Glucose reference range applies only to samples taken after fasting for at least 8 hours.  . BUN 12/12/2019 22* 6 - 20 mg/dL Final  . Creatinine, Ser 12/12/2019 1.21  0.61 - 1.24 mg/dL Final  . Calcium 12/12/2019 8.9  8.9 - 10.3 mg/dL Final  . Total Protein 12/12/2019 6.8  6.5 - 8.1 g/dL Final  . Albumin 12/12/2019 4.1  3.5 - 5.0 g/dL Final  . AST 12/12/2019 20  15 - 41 U/L Final  . ALT 12/12/2019 31  0 - 44 U/L Final  . Alkaline Phosphatase 12/12/2019 60  38 - 126 U/L Final  . Total Bilirubin 12/12/2019 1.1  0.3 - 1.2 mg/dL Final  . GFR calc non Af Amer 12/12/2019 >60  >60 mL/min Final  . GFR calc Af Amer 12/12/2019 >60  >60 mL/min Final  . Anion gap 12/12/2019 7  5 - 15 Final   Performed at [facility], [address] [place], [place] [address]  . WBC 12/12/2019 8.1  4.0 - 10.5 K/uL Final  . RBC 12/12/2019 5.52  4.22 - 5.81 MIL/uL Final  . Hemoglobin 12/12/2019 16.3  13.0 - 17.0 g/dL Final  . HCT 12/12/2019 47.6  39.0 - 52.0 % Final    . MCV 12/12/2019 86.2  80.0 - 100.0 fL Final  . MCH 12/12/2019 29.5  26.0 - 34.0 pg Final  . MCHC 12/12/2019 34.2  30.0 - 36.0 g/dL Final  . RDW 12/12/2019 15.0  11.5 - 15.5 % Final  . Platelets 12/12/2019 185  150 - 400 K/uL Final  . nRBC 12/12/2019 0.0  0.0 - 0.2 % Final  . Neutrophils Relative % 12/12/2019 79  % Final  . Neutro Abs 12/12/2019 6.6  1.7 - 7.7 K/uL Final  . Lymphocytes Relative 12/12/2019 9  % Final  . Lymphs Abs 12/12/2019 0.7  0.7 - 4.0 K/uL Final  . Monocytes Relative 12/12/2019 7  % Final  . Monocytes Absolute 12/12/2019 0.5  0.1 - 1.0 K/uL Final  . Eosinophils Relative 12/12/2019 3  % Final  . Eosinophils Absolute 12/12/2019 0.2  0.0 - 0.5 K/uL Final  . Basophils Relative 12/12/2019 1  % Final  . Basophils Absolute 12/12/2019 0.0  0.0 - 0.1 K/uL Final  . Immature Granulocytes 12/12/2019 1  % Final  . Abs Immature Granulocytes 12/12/2019 0.04  0.00 - 0.07 K/uL Final   Performed at [facility], [address] [place], [place] [address]    Assessment:  Ronnie Mcintyre is a 54 y.o. 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 [facility] pathologyrevealed necrotic tissue. [facility] consultationrevealed neoplastic cells are positive for CD20, BCL-6, MUM-1, and BCL-2. The neoplastic cells were negative for CD30, CD3, , CD10, c-MYC, EBV ISH, AE1/3, and PAX-8. Ki67 is 60-70%. 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 The absence of CD30 expression makes primary mediastinal large B-cell lymphoma less likely. FISH for MYC rearrangement was negative.IPI score, age adjusted IPI, are low (1) and NCCN IPI scoreis low-intermediate (2).  He presented with upper chest fullness with radiation to his neck and arm. He has had B symptoms (sweats and weight loss) worrisome for lymphoma. LDHis 273. Uric acid was normal. Beta-HCG and AFP were normal on 01/16/2018.  PET scanon  01/26/2018 revealed an 11.1 cm intensely hypermetabolic and partially necrotic right anterior mediastinal mass, compatible with  malignancy. There was diffuse marrow hypermetabolism, nonspecific, cannot exclude an infiltrative neoplastic marrow process. There was no focal skeletal hypermetabolism. There was no focal bone lesions on the CT images. There was nonspecific mild heterogeneous prostatic hypermetabolism. 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   Bone marrow biopsyon 02/03/2018 revealed a slightly hypercellular bone marrow for age with trilineage hematopoiesis. There was no monoclonal B-cell population or abnormal T-cell phenotype identified. Cytogenetics were normal (46, XY).  Echoon 01/15/2018 revealed an EF of 55-60%. Echoon 04/22/2018 revealed an EF of 50 to 55%. There was evidence of probablesevere hypokinesisof the mid apical-anteroseptal, anterior, and apical myocardium. Treadmill Myoviewon 04/26/2018 revealed a low risk study with a small defect present in the apex location that was partially reversible and could represent small previous myocardial infarction. However, and apical thinning artifact could not be excluded. Nuclear stress EF: 50%. Echoon 05/17/2018 revealed an EF of 50-55%.Echoon 08/31/2018 revealed an ejection fraction of 40-45%.  Echo on 10/27/2019 revealed an EF of 40-45%.  Hepatitis B, hepatitis C, and HIV testing was negative on 02/10/2018. G6PD assay was normal.  He received 6 cycles of RCHOPchemotherapy (02/16/2018 - 06/10/2018) with Neulasta support. He received radiation(3600 cGy in 18 fractions) from 07/27/2018 - 08/19/2018.  Chest, abdomen and pelvis CT on 12/14/2019 revealed continued interval decrease in size of the anterior mediastinal soft tissue, likely treated tumor. There were no new or progressive findings.  There was no new areas of lymphadenopathy in the chest,  abdomen or pelvis.  There was stable age advanced three-vessel coronary artery calcifications and a stable small left adrenal gland adenoma.  He was diagnosed with T6-T7herpes zosteron 03/19/2018. He was treated with valacyclovir.   He plans to get the COVID-19 vaccine soon.    Symptomatically, he is doing well.  He denies any fevers, sweats or weight loss.  He has a sensation of something in his throat.  He denies any sinus drainage.  Exam reveals no adenopathy or hepatosplenomegaly  Plan: 1.   Review labs from 12/12/2019.  2.Diffuse large B cell lymphoma Clinically, he continues to do well. Review chest, abdomen and pelvic CT scan from 12/14/2019.  Image personally reviewed.  Agree with radiology interpretation.    No evidence of recurrent disease.  Continue surveillance 3.Cardiac wall motion abnormalities Clinically, he denies any symptoms of CHF.  Echo on 10/27/2019 revealed an EF of 40-45%. Continue close monitoring by cardiology 4.   Abnormal throat sensation  Etiology unclear.  No abnormality noted on scan.  Consider ENT evaluation. 5.Port-a-cath  Port flush every 8 weeks.  Consider port removal after next scans. 6.   RTC in 16 weeks for MD assessment, labs (CBC with diff, CMP, LDH, uric acid), and port flush  I discussed the assessment and treatment plan with the patient.  The patient was provided an opportunity to ask questions and all were answered.  The patient agreed with the plan and demonstrated an understanding of the instructions.  The patient was advised to call back if the symptoms worsen or if the condition fails to improve as anticipated.   Lequita Asal, MD, PhD    12/15/2019, 9:01 AM   I, Selena Batten, am acting as scribe for Lequita Asal, MD, PhD.   I, Drue Second. Mike Gip, MD, have reviewed the above documentation for accuracy and completeness, and I agree with the above.

## 2019-12-14 ENCOUNTER — Encounter: Payer: Self-pay | Admitting: Hematology and Oncology

## 2019-12-14 ENCOUNTER — Other Ambulatory Visit: Payer: Self-pay

## 2019-12-14 ENCOUNTER — Ambulatory Visit
Admission: RE | Admit: 2019-12-14 | Discharge: 2019-12-14 | Disposition: A | Discharge status: Home / Self Care | Payer: Commercial Managed Care - PPO | Source: Ambulatory Visit | Attending: Hematology and Oncology | Admitting: Hematology and Oncology

## 2019-12-14 DIAGNOSIS — C8580 Other specified types of non-Hodgkin lymphoma, unspecified site: Secondary | ICD-10-CM | POA: Insufficient documentation

## 2019-12-14 MED ORDER — IOHEXOL 300 MG/ML  SOLN
150.0000 mL | Freq: Once | INTRAMUSCULAR | Status: AC | PRN
  Administered 2019-12-14: 125 mL via INTRAVENOUS

## 2019-12-14 NOTE — Progress Notes (Signed)
Patient was called for preassessment. Patient denied any pain or concerns at this time.

## 2019-12-15 ENCOUNTER — Inpatient Hospital Stay: Payer: Commercial Managed Care - PPO | Admitting: Hematology and Oncology

## 2019-12-15 VITALS — BP 103/80 | HR 83 | Temp 94.8°F | Resp 16 | Wt 260.7 lb

## 2019-12-15 DIAGNOSIS — C8522 Mediastinal (thymic) large B-cell lymphoma, intrathoracic lymph nodes: Secondary | ICD-10-CM | POA: Diagnosis present

## 2019-12-15 DIAGNOSIS — T451X5A Adverse effect of antineoplastic and immunosuppressive drugs, initial encounter: Secondary | ICD-10-CM

## 2019-12-15 DIAGNOSIS — Z95828 Presence of other vascular implants and grafts: Secondary | ICD-10-CM

## 2019-12-15 DIAGNOSIS — I427 Cardiomyopathy due to drug and external agent: Secondary | ICD-10-CM | POA: Diagnosis not present

## 2019-12-15 DIAGNOSIS — C8580 Other specified types of non-Hodgkin lymphoma, unspecified site: Secondary | ICD-10-CM | POA: Diagnosis not present

## 2019-12-21 NOTE — Progress Notes (Signed)
Cardiology Office Note    Date:  12/28/2019   ID:  Ronnie Mcintyre, DOB 05-09-1966, MRN [id number]  PCP:  Ronnie Kayser, MD  Cardiologist:  [name], MD  Electrophysiologist:  None   Chief Complaint: Follow-up  History of Present Illness:   Ronnie Mcintyre is a 54 y.o. 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   He was diagnosed with large B-cell lymphoma in 12/2017 and underwent chemotherapy with RCHOP. Echo in 12/2017, pre-chemotherapy, showed an EF of 55-60% with no RWMA and a mildly dilated aortic root at 40 mm. Repeat echo after his third cycle of chemotherapy in 04/2018 showed an EF of 50-55% with probable severe hypokinesis of the mid to distal anteroseptal, anterior, and apical myocardium. Treadmill Myoview in 04/2018, showed the patient was able to exercise for 4 minutes and 45 seconds with perfusion being sub optimal overall, with a small apical defect felt to be likely secondary to apical thinning, although a small infarct could not be excluded. EF 50%. Overall, this was a low risk study.  He has also undergone extensive radiation complicated by coughing and SOB that slowly improved. Following discontinuation of HCTZ and titration of Coreg, repeat echo in 08/2018 showed an EF of 40-45% with the patient noting improvement in symptoms. He was seen virtually in 11/2018, and was able to walk 3 miles per day. He was continued on Coreg and telmisartan. He was last seen in the office in 09/2019 and was doing well from a cardiac perspective. His place of employment was quite busy in the winter months, and in this setting he had not been able to continue with his walking regimen at home, though did plan to do so as the weather warms up. Given that he was done with chemotherapy, he underwent repeat echo on 10/27/2019 which showed an EF of 40 to 45%, global hypokinesis, grade  1 diastolic dysfunction, normal RV systolic function and RV cavity size, no significant valvular abnormality, mildly dilated aortic root and ascending aorta. When compared to his prior study, his EF and dilated aortic root/ascending aorta were stable. Spironolactone was added to his regimen. Recent CT abdomen pelvis ordered by oncology for continued monitoring on 12/14/2019 showed continued interval decrease in the size of anterior mediastinal soft tissue felt to be likely treated tumor with no new or progressive findings or new areas of lymphadenopathy in the chest, abdomen, or pelvis. He was noted to have stable age advanced three-vessel coronary artery calcification on this study.  He comes in doing well from a cardiac perspective.  He is now back to his walking regimen over the past week and a half and is walking 3 miles per day without any cardiac limitation.  He indicates he feels better with this than he has when he is unable to walk secondary to work.  He denies any chest pain, dyspnea, palpitations, dizziness, presyncope, syncope.  No lower extremity swelling, orthopnea, or early satiety.  He is tolerating all medications without issues.  He does have a mildly pruritic rash along the bilateral antecubital fossa.  He otherwise is without concerns at this time.   Labs independently reviewed: 11/2019 - Hgb 16.3, PLT 185, potassium 4.4, BUN 22, serum creatinine 1.21, albumin 4.1, AST/ALT normal 03/2019 - TC 153, TG 125, HDL 24, LDL 93 09/2018 - A1c 4.8 08/2018 - TSH normal 06/2018 - magnesium 1.8  Past Medical History:  Diagnosis Date  . Cancer ([place])   . Hypertension     Past Surgical History:  Procedure Laterality Date  . ANKLE SURGERY Right   . PORTACATH PLACEMENT Right 02/11/2018   Procedure: INSERTION PORT-A-CATH;  Surgeon: Jules Husbands, MD;  Location: [facility] ORS;  Service: General;  Laterality: Right;    Current Medications: Current Meds  Medication Sig  . acetaminophen (TYLENOL) 325  MG tablet Take 2 tablets (650 mg total) by mouth every 6 (six) hours as needed for mild pain (or Fever >/= 101).  [name] aspirin 81 MG tablet Take 81 mg by mouth daily.   . carvedilol (COREG) 6.25 MG tablet Take 1 tablet (6.25 mg total) by mouth 2 (two) times daily with a meal.  . simvastatin (ZOCOR) 40 MG tablet Take 40 mg by mouth daily.  [name] spironolactone (ALDACTONE) 25 MG tablet Take 0.5 tablets (12.5 mg total) by mouth daily.  [name] telmisartan (MICARDIS) 80 MG tablet Take by mouth.    Allergies:   Patient has no known allergies.   Social History   Socioeconomic History  . Marital status: Single    Spouse name: Not on file  . Number of children: Not on file  . Years of education: Not on file  . Highest education level: Not on file  Occupational History  . Not on file  Tobacco Use  . Smoking status: Never Smoker  . Smokeless tobacco: Never Used  Substance and Sexual Activity  . Alcohol use: Yes    Comment: rarely  . Drug use: No  . Sexual activity: Not on file  Other Topics Concern  . Not on file  Social History Narrative  . Not on file   Social Determinants of Health   Financial Resource Strain:   . Difficulty of Paying Living Expenses:   Food Insecurity:   . Worried About Charity fundraiser in the Last Year:   . Arboriculturist in the Last Year:   Transportation Needs:   . Film/video editor (Medical):   [name] Lack of Transportation (Non-Medical):   Physical Activity:   . Days of Exercise per Week:   . Minutes of Exercise per Session:   Stress:   . Feeling of Stress :   Social Connections:   . Frequency of Communication with Friends and Family:   . Frequency of Social Gatherings with Friends and Family:   . Attends Religious Services:   . Active Member of Clubs or Organizations:   . Attends Archivist Meetings:   [name] Marital Status:      Family History:  The patient's family history includes Hypertension in his father and mother; Multiple myeloma in his  father.  ROS:   Review of Systems  Constitutional: Negative for chills, diaphoresis, fever, malaise/fatigue and weight loss.  HENT: Negative for congestion.   Eyes: Negative for discharge and redness.  Respiratory: Negative for cough, sputum production, shortness of breath and wheezing.   Cardiovascular: Negative for chest pain, palpitations, orthopnea, claudication, leg swelling and PND.  Gastrointestinal: Negative for abdominal pain, heartburn, nausea and vomiting.  Musculoskeletal: Negative for falls and myalgias.  Skin: Positive for rash.  Neurological: Negative for dizziness, tingling, tremors, sensory change, speech change, focal weakness, loss of consciousness and weakness.  Endo/Heme/Allergies: Does not bruise/bleed easily.  Psychiatric/Behavioral: Negative for substance abuse. The patient is not nervous/anxious.   All other systems reviewed and are negative.    EKGs/Labs/Other Studies Reviewed:  Studies reviewed were summarized above. The additional studies were reviewed today:  10/2019: 1. Left ventricular ejection fraction, by estimation, is 40 to 45%. The  left ventricle has mild to moderately decreased function. The left  ventricle demonstrates global hypokinesis. There is mild left ventricular  hypertrophy. Left ventricular diastolic  parameters are consistent with Grade I diastolic dysfunction (impaired  relaxation).  2. Right ventricular systolic function is normal. The right ventricular  size is normal.  3. The mitral valve is normal in structure. No evidence of mitral valve  regurgitation.  4. The aortic valve is normal in structure. Aortic valve regurgitation is  not visualized.  5. Aortic dilatation noted. There is mild dilatation of the aortic root  and of the ascending aorta measuring 42 mm.   EKG:  EKG is ordered today.  The EKG ordered today demonstrates NSR, 93 bpm, left axis deviation, rare PVC, poor R wave progression along the precordial leads,  nonspecific ST-T changes, largely unchanged when compared to prior study  Recent Labs: 12/12/2019: ALT 31; BUN 22; Creatinine, Ser 1.21; Hemoglobin 16.3; Platelets 185; Potassium 4.4; Sodium 138  Recent Lipid Panel No results found for: CHOL, TRIG, HDL, CHOLHDL, VLDL, LDLCALC, LDLDIRECT  PHYSICAL EXAM:    VS:  BP 102/84 (BP Location: Left Arm, Patient Position: Sitting, Cuff Size: Normal)   Pulse 93   Ht 6' (1.829 m)   Wt 262 lb 4 oz (119 kg)   SpO2 98%   BMI 35.57 kg/m   BMI: Body mass index is 35.57 kg/m.  Physical Exam  Constitutional: He is oriented to person, place, and time. He appears well-developed and well-nourished.  HENT:  Head: Normocephalic and atraumatic.  Eyes: Right eye exhibits no discharge. Left eye exhibits no discharge.  Neck: No JVD present.  Cardiovascular: Normal rate, regular rhythm, S1 normal, S2 normal and normal heart sounds. Exam reveals no distant heart sounds, no friction rub, no midsystolic click and no opening snap.  No murmur heard. Pulses:      Posterior tibial pulses are 2+ on the right side and 2+ on the left side.  Pulmonary/Chest: Effort normal and breath sounds normal. No respiratory distress. He has no decreased breath sounds. He has no wheezes. He has no rales. He exhibits no tenderness.  Abdominal: Soft. He exhibits no distension. There is no abdominal tenderness.  Musculoskeletal:        General: No edema.     Cervical back: Normal range of motion.  Neurological: He is alert and oriented to person, place, and time.  Skin: Skin is warm and dry. Rash noted. No cyanosis. Nails show no clubbing.  Psychiatric: He has a normal mood and affect. His speech is normal and behavior is normal. Judgment and thought content normal.    Wt Readings from Last 3 Encounters:  12/28/19 262 lb 4 oz (119 kg)  12/15/19 260 lb 11.1 oz (118.2 kg)  09/21/19 257 lb (116.6 kg)     ASSESSMENT & PLAN:   1. 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  Continue GDMT including carvedilol, spironolactone, and telmisartan.  Relative hypotension precludes further escalation of evidence-based therapy or transition from telmisartan to Entresto.  CHF education.  2. Coronary artery calcification: He is without any symptoms concerning for angina.  Prior stress test was low risk.  He is ambulating 3 miles per day now without any symptoms concerning for angina.  Aggressive risk factor modification as outlined below with transition from simvastatin to rosuvastatin.  We did discuss possible calcium score though I do not think this would change our management at this time given he is asymptomatic and we would plan to transition him to a high intensity statin nonetheless.  Should he develop any symptoms concerning for angina would have low threshold to pursue coronary CTA.  3. Dilated aortic root/ascending aorta: Optimal BP and heart rate control recommended.  Follow-up imaging in 10/2020.  4. PVCs: Asymptomatic.  Isolated PVCs noted on EKG and rhythm strip in the office today.  Continue carvedilol as outlined above.  5. HTN: Blood pressure remains well controlled.  Continue current medications as outlined above.  6. HLD: LDL 93 from 03/2019.  With significant multivessel coronary artery calcification noted on CT chest goal LDL is less than 70, possibly less than 60.  Discontinue simvastatin.  Start rosuvastatin 40 mg daily.  Recheck fasting lipid panel and liver function in 8 weeks.  7. Rash: Rash along the antecubital fossa of the bilateral upper extremities appears to be consistent with contact dermatitis.  Advised patient if rash persists he should contact PCP or oncology.  Rash is not consistent with drug reaction.  Disposition: F/u with Dr. Fletcher Anon or an APP in 3 months.   Medication Adjustments/Labs and Tests Ordered: Current medicines are reviewed at length with the patient today.  Concerns regarding medicines are outlined above.  Medication changes, Labs and Tests ordered today are summarized above and listed in the Patient Instructions accessible in Encounters.   Signed, Christell Faith, PA-C 12/28/2019 3:11 PM     [address] [place], [place] [address] [phone]

## 2019-12-27 ENCOUNTER — Ambulatory Visit: Payer: Commercial Managed Care - PPO | Admitting: Physician Assistant

## 2019-12-28 ENCOUNTER — Encounter: Payer: Self-pay | Admitting: Physician Assistant

## 2019-12-28 ENCOUNTER — Ambulatory Visit (INDEPENDENT_AMBULATORY_CARE_PROVIDER_SITE_OTHER): Payer: Commercial Managed Care - PPO | Admitting: Physician Assistant

## 2019-12-28 ENCOUNTER — Other Ambulatory Visit: Payer: Self-pay

## 2019-12-28 VITALS — BP 102/84 | HR 93 | Ht 72.0 in | Wt 262.2 lb

## 2019-12-28 DIAGNOSIS — I5022 Chronic systolic (congestive) heart failure: Secondary | ICD-10-CM

## 2019-12-28 DIAGNOSIS — I77810 Thoracic aortic ectasia: Secondary | ICD-10-CM

## 2019-12-28 DIAGNOSIS — Z79899 Other long term (current) drug therapy: Secondary | ICD-10-CM

## 2019-12-28 DIAGNOSIS — R21 Rash and other nonspecific skin eruption: Secondary | ICD-10-CM

## 2019-12-28 DIAGNOSIS — E785 Hyperlipidemia, unspecified: Secondary | ICD-10-CM

## 2019-12-28 DIAGNOSIS — I427 Cardiomyopathy due to drug and external agent: Secondary | ICD-10-CM | POA: Diagnosis not present

## 2019-12-28 DIAGNOSIS — I493 Ventricular premature depolarization: Secondary | ICD-10-CM

## 2019-12-28 DIAGNOSIS — I2584 Coronary atherosclerosis due to calcified coronary lesion: Secondary | ICD-10-CM

## 2019-12-28 DIAGNOSIS — I2510 Atherosclerotic heart disease of native coronary artery without angina pectoris: Secondary | ICD-10-CM | POA: Diagnosis not present

## 2019-12-28 DIAGNOSIS — I10 Essential (primary) hypertension: Secondary | ICD-10-CM

## 2019-12-28 DIAGNOSIS — T451X5A Adverse effect of antineoplastic and immunosuppressive drugs, initial encounter: Secondary | ICD-10-CM

## 2019-12-28 MED ORDER — ROSUVASTATIN CALCIUM 40 MG PO TABS
40.0000 mg | ORAL_TABLET | Freq: Every day | ORAL | 6 refills | Status: DC
Start: 2019-12-28 — End: 2020-06-25

## 2019-12-28 NOTE — Patient Instructions (Signed)
Medication Instructions:   Your physician has recommended you make the following change in your medication:   1.  STOP taking your Simvastatin. 2.  START taking Crestor (Rosuvastatin) Take 1 tablet (40 mg total) by mouth daily.  *If you need a refill on your cardiac medications before your next appointment, please call your pharmacy*   Lab Work:  Get labs drawn in 8 weeks (Aug. 9th)  - You will need to be fasting. Please do not have anything to eat or drink after midnight the morning you have the lab work. You may only have water or black coffee with no cream or sugar.  - Please go to the [facility] You will check in at the front desk to the right as you walk into the atrium. Valet Parking is offered if needed. - No appointment needed. You may go any day between 7 am and 6 pm.  If you have labs (blood work) drawn today and your tests are completely normal, you will receive your results only by: [name] MyChart Message (if you have MyChart) OR . A paper copy in the mail If you have any lab test that is abnormal or we need to change your treatment, we will call you to review the results.   Testing/Procedures: None Ordered.   Follow-Up: At [facility], you and your health needs are our priority.  As part of our continuing mission to provide you with exceptional heart care, we have created designated Provider Care Teams.  These Care Teams include your primary Cardiologist (physician) and Advanced Practice Providers (APPs -  Physician Assistants and Nurse Practitioners) who all work together to provide you with the care you need, when you need it.  We recommend signing up for the patient portal called "MyChart".  Sign up information is provided on this After Visit Summary.  MyChart is used to connect with patients for Virtual Visits (Telemedicine).  Patients are able to view lab/test results, encounter notes, upcoming appointments, etc.  Non-urgent messages can be sent to your  provider as well.   To learn more about what you can do with MyChart, go to [email or web address].    Your next appointment:   3 month(s)  The format for your next appointment:   In Person  Provider:    You may see [name], MD or one of the following Advanced Practice Providers on your designated Care Team:    Murray Hodgkins, NP  Christell Faith, PA-C  Marrianne Mood, PA-C    Other Instructions N/A

## 2020-02-09 ENCOUNTER — Other Ambulatory Visit: Payer: Self-pay

## 2020-02-09 ENCOUNTER — Inpatient Hospital Stay: Payer: Commercial Managed Care - PPO | Attending: Hematology and Oncology

## 2020-02-09 DIAGNOSIS — Z452 Encounter for adjustment and management of vascular access device: Secondary | ICD-10-CM | POA: Diagnosis not present

## 2020-02-09 DIAGNOSIS — Z95828 Presence of other vascular implants and grafts: Secondary | ICD-10-CM

## 2020-02-09 DIAGNOSIS — Z8572 Personal history of non-Hodgkin lymphomas: Secondary | ICD-10-CM | POA: Insufficient documentation

## 2020-02-09 MED ORDER — HEPARIN SOD (PORK) LOCK FLUSH 100 UNIT/ML IV SOLN
500.0000 [IU] | Freq: Once | INTRAVENOUS | Status: AC
  Administered 2020-02-09: 500 [IU] via INTRAVENOUS
  Filled 2020-02-09: qty 5

## 2020-02-09 MED ORDER — SODIUM CHLORIDE 0.9% FLUSH
10.0000 mL | Freq: Once | INTRAVENOUS | Status: AC
  Administered 2020-02-09: 10 mL via INTRAVENOUS
  Filled 2020-02-09: qty 10

## 2020-02-14 ENCOUNTER — Other Ambulatory Visit: Payer: Self-pay | Admitting: Cardiovascular Disease

## 2020-02-25 IMAGING — CT CT CHEST W/ CM
2 of 3 series · 15 of 31 positions shown, 18 images · IV contrast (omnipaque)
Comparison: 04/19/2018 and PET-CT 11/18/2018

CLINICAL DATA: Follow-up staging large B-cell lymphoma. Last
chemotherapy May 2018. Completed radiation July 2018.

EXAM:
CT CHEST, ABDOMEN, AND PELVIS WITH CONTRAST
TECHNIQUE: Multidetector CT imaging of the chest, abdomen and pelvis was
performed following the standard protocol during bolus
administration of intravenous contrast.
CONTRAST:  100mL OMNIPAQUE IOHEXOL 300 MG/ML  SOLN

[Series 2: cap with · axial · 0.87mm/px · z∈[-981,-406]mm · 12 of 139 slices shown, 15 images]
[im 12/139  mediastinal]
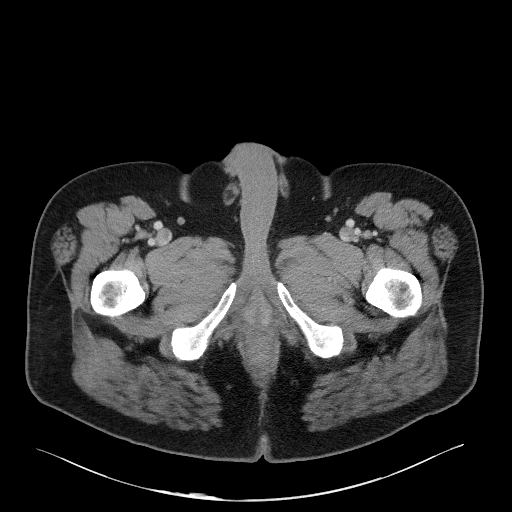
[im 12/139  lung]
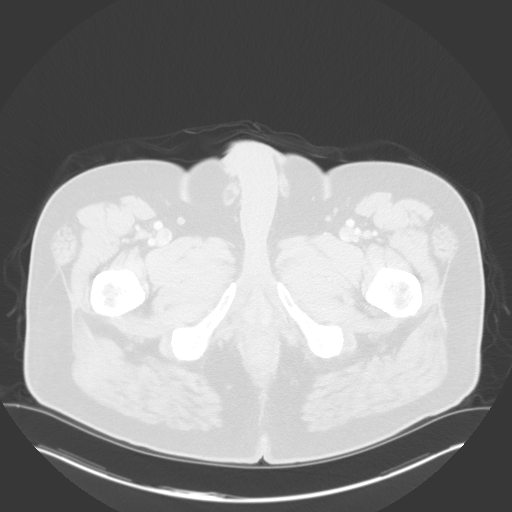
[im 24/139  lung]
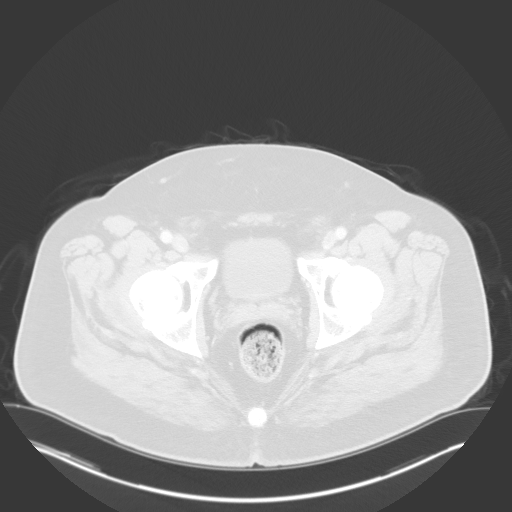
[im 35/139  lung]
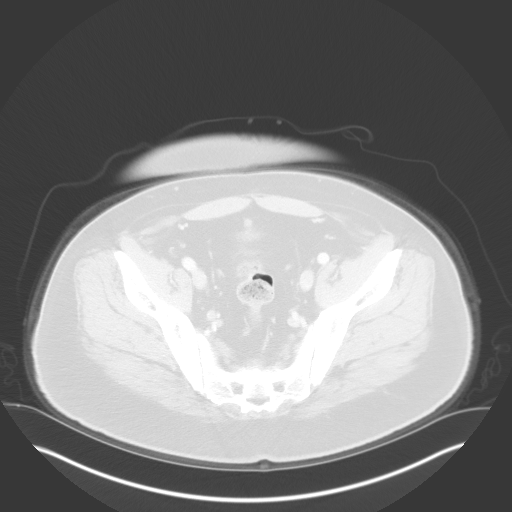
[im 47/139  lung]
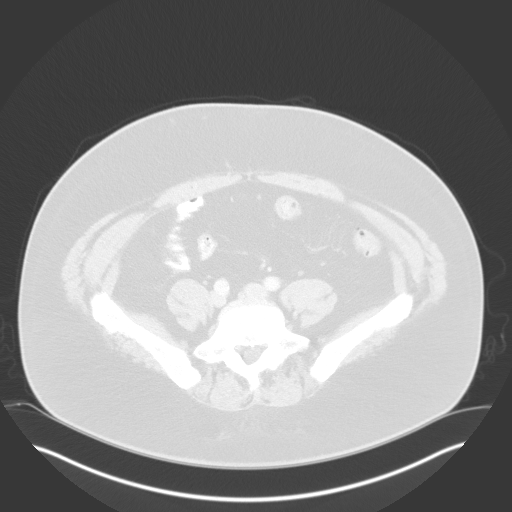
[im 58/139  mediastinal]
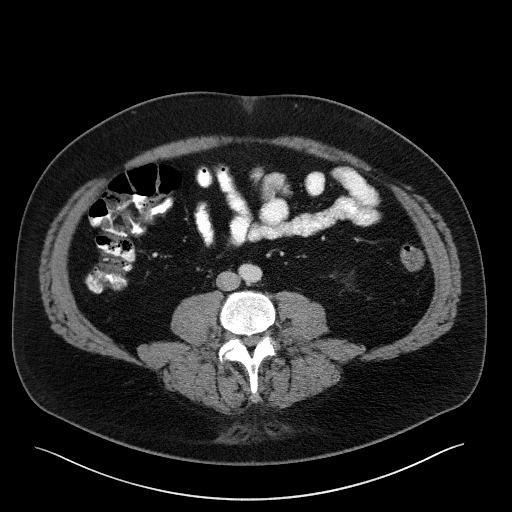
[im 58/139  lung]
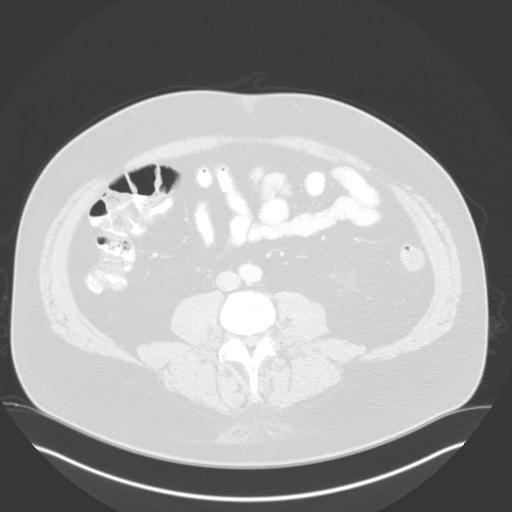
[im 68/139  lung]
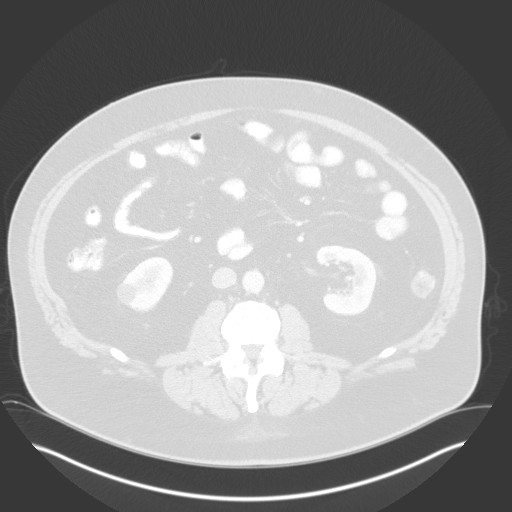
[im 70/139  lung]
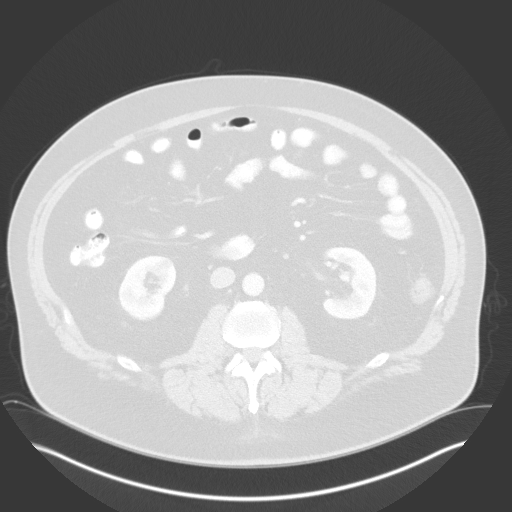
[im 81/139  lung]
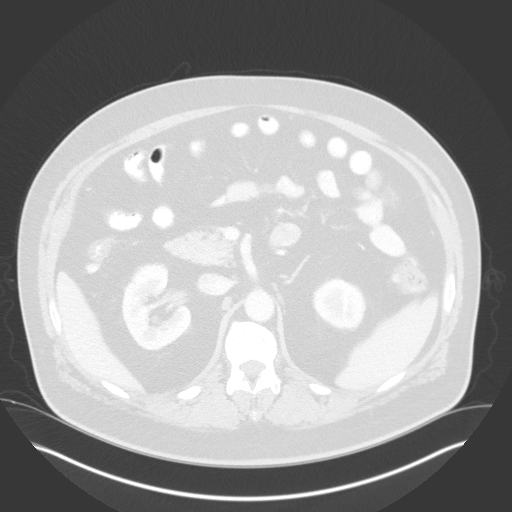
[im 93/139  mediastinal]
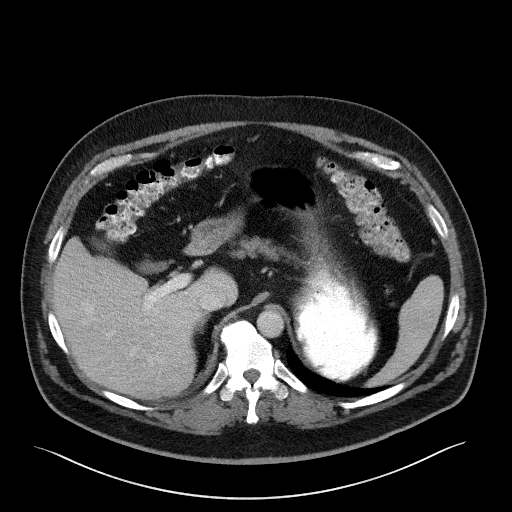
[im 93/139  lung]
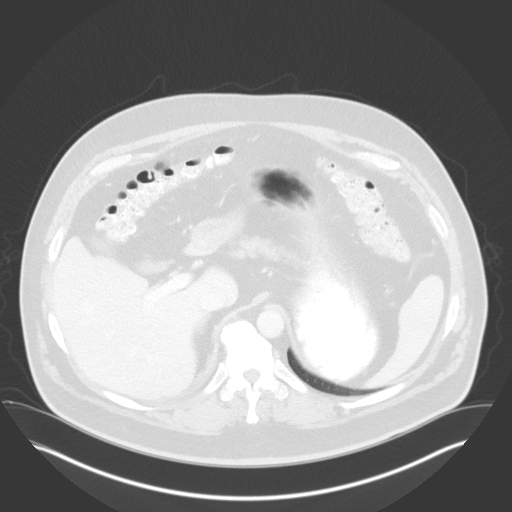
[im 104/139  lung]
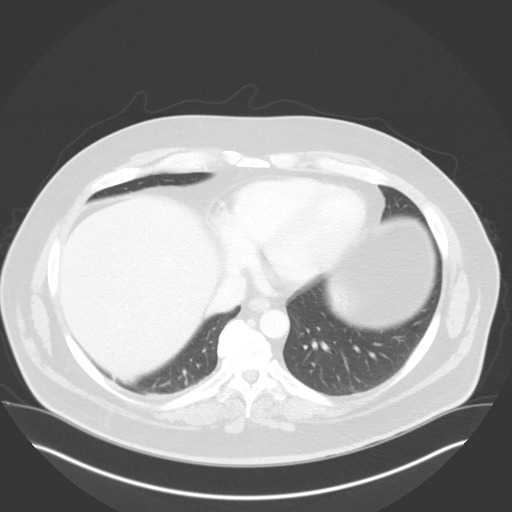
[im 116/139  lung]
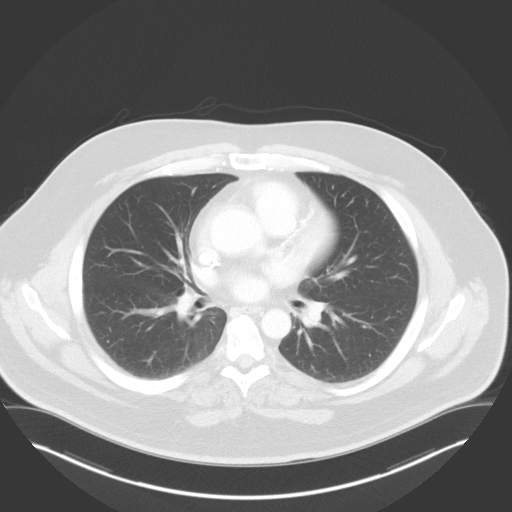
[im 127/139  lung]
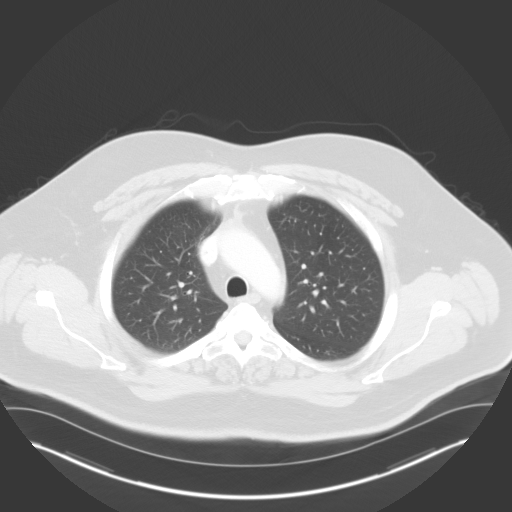

[Series 5: lung · axial · 0.87mm/px · z∈[-574,-506]mm · 3 of 126 slices shown]
[im 12/126  lung]
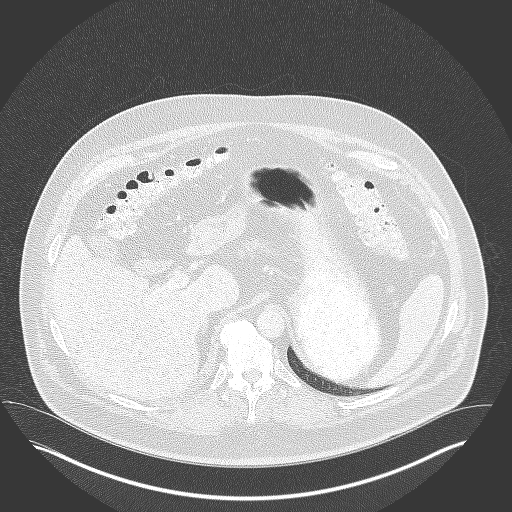
[im 35/126  lung]
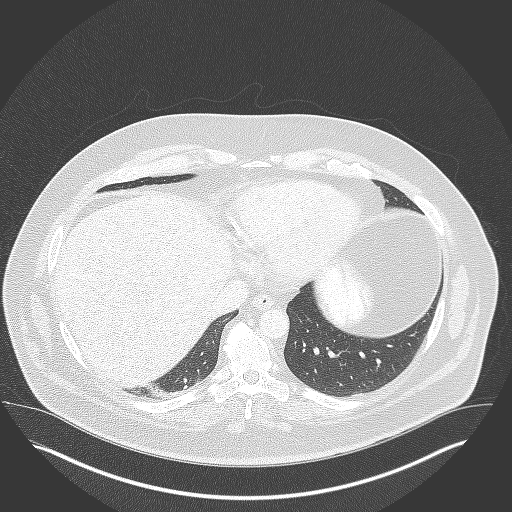
[im 46/126  lung]
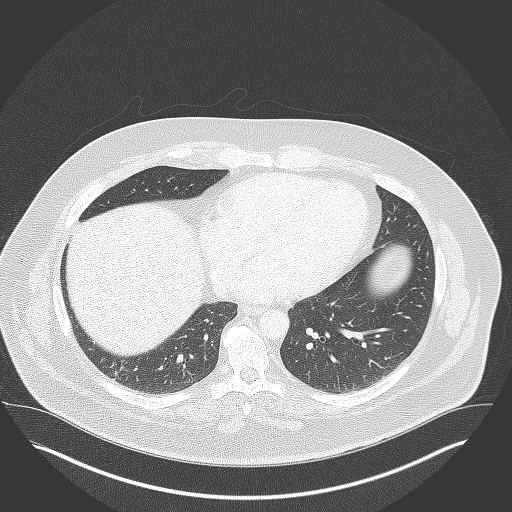

[15 of 31 positions shown; findings below may reference images not displayed]

FINDINGS: CT CHEST FINDINGS

Cardiovascular: Right IJ Port-A-Cath with tip over the cavoatrial
junction. Heart is normal size. Mild calcified plaque over the 3
vessel coronary arteries. Mild ectasia of the ascending thoracic
aorta measuring 3.8 cm in AP diameter. Pulmonary arterial system is
normal. Remaining vascular structures are normal.

Mediastinum/Nodes: Continued interval decrease in size of anterior
mediastinal mass now with only minimal residual soft tissue density
in this region measuring 1.6 x 3.2 cm (previously 2.2 x 4.2 cm
11/18/2018). No other mediastinal or hilar adenopathy. Remaining
mediastinal structures are normal.

Lungs/Pleura: Lungs are well inflated without focal airspace
consolidation or effusion. No new nodules/masses. Minimal right
basilar atelectasis. Airways are normal.

Musculoskeletal: Mild degenerative change of the spine.

CT ABDOMEN PELVIS FINDINGS

Hepatobiliary: Gallbladder, liver and biliary tree are normal.

Pancreas: Normal.

Spleen: Normal.

Adrenals/Urinary Tract: Stable 1.6 cm left adrenal nodule which is
not hypermetabolic on previous PET-CT. Right adrenal gland is
normal. Kidneys are normal in size without hydronephrosis or
nephrolithiasis. Bilateral renal cysts. Ureters and bladder are
normal.

Stomach/Bowel: Stomach and small bowel are normal. Appendix is not
visualized. Colon is normal.

Vascular/Lymphatic: Abdominal aorta is normal in caliber without
aneurysm or significant calcified atherosclerotic plaque. No
adenopathy.

Reproductive: Normal.

Other: No free fluid or inflammatory change.

Musculoskeletal: Unchanged.
IMPRESSION: 1. Continued interval decrease in size of minimal residual anterior
mediastinal soft tissue density now measuring 1.6 x 3.2 cm
(previously 2.2 x 4.2 cm) known to represent patient's original
large B-cell lymphoma. No new findings in the chest and no evidence
of metastatic disease within the abdomen/pelvis.

2.  Small bilateral renal cysts.

3.  Stable 1.6 cm left adrenal nodule likely adenoma.

4. Mild ectasia of the ascending thoracic aorta measuring 3.8 cm in
AP diameter unchanged. Recommend annual imaging followup by CTA or
MRA. This recommendation follows 8565
ACCF/AHA/AATS/ACR/ASA/SCA/MARCOS MAURICIO/DUANE/MERJALIIS/BLANKENSHIP Guidelines for the
Diagnosis and Management of Patients with Thoracic Aortic Disease.
Circulation.8565; 121: E266-e369. Aortic aneurysm NOS (EA9YZ-QZF.6).
Atherosclerotic coronary artery disease.

## 2020-03-29 ENCOUNTER — Ambulatory Visit: Payer: Commercial Managed Care - PPO | Admitting: Cardiovascular Disease

## 2020-03-29 ENCOUNTER — Encounter: Payer: Self-pay | Admitting: Cardiovascular Disease

## 2020-03-29 ENCOUNTER — Other Ambulatory Visit: Payer: Self-pay

## 2020-03-29 VITALS — BP 100/70 | HR 95 | Ht 72.0 in | Wt 264.4 lb

## 2020-03-29 DIAGNOSIS — E785 Hyperlipidemia, unspecified: Secondary | ICD-10-CM

## 2020-03-29 DIAGNOSIS — I10 Essential (primary) hypertension: Secondary | ICD-10-CM

## 2020-03-29 DIAGNOSIS — I5022 Chronic systolic (congestive) heart failure: Secondary | ICD-10-CM

## 2020-03-29 NOTE — Patient Instructions (Signed)

## 2020-03-29 NOTE — Progress Notes (Signed)
Cardiology Office Note   Date:  03/29/2020   ID:  Ronnie Mcintyre, DOB March 20, 1966, MRN [id number]  PCP:  [name], MD  Cardiologist:   [name], MD   Chief Complaint  Patient presents with  . office visit    3 month F/U-Patient reports "not feeling well"/nausea after getting second dose of Moderna COVID vaccine this morning; Meds verbally reviewed with patient.      History of Present Illness: Ronnie Mcintyre is a 54 y.o. 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 The patient has known history of obesity, hypertension and hyperlipidemia.  He has no prior cardiac history.  He is not a smoker or diabetic.  He has no family history of coronary artery disease. 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  He had an echocardiogram done in June 2019 before chemotherapy which showed an EF of 55 to 60% with no wall motion abnormalities and mildly dilated aortic root at 40 mmHg.  He had a repeat echocardiogram after third cycle of chemotherapy which showed an EF of 50 to 55% with probable severe hypokinesis of the mid to distal anteroseptal, anterior and apical myocardium.   Treadmill Myoview in October of 2019 was suboptimal overall but there was a small apical defect likely due to to apical thinning although a small infarct could not be excluded.  Nonetheless, it was a low risk study with EF of 50%. He had extensive radiation therapy that was complicated by cough and shortness of breath that gradually improved.  Repeat echocardiogram in early 2020 showed an EF of 40 to 45%.  His echo was again repeated in April 2021 and was stable with an EF of 40 to 45%.  He has been doing well with no recent chest pain, shortness of breath or palpitations.  He walks daily for exercise with no significant limitations.   Past Medical History:  Diagnosis Date  . Cancer ([place])   . Hypertension     Past  Surgical History:  Procedure Laterality Date  . ANKLE SURGERY Right   . PORTACATH PLACEMENT Right 02/11/2018   Procedure: INSERTION PORT-A-CATH;  Surgeon: Jules Husbands, MD;  Location: [facility] ORS;  Service: General;  Laterality: Right;     Current Outpatient Medications  Medication Sig Dispense Refill  . acetaminophen (TYLENOL) 325 MG tablet Take 2 tablets (650 mg total) by mouth every 6 (six) hours as needed for mild pain (or Fever >/= 101).    [name] aspirin 81 MG tablet Take 81 mg by mouth daily.     . carvedilol (COREG) 6.25 MG tablet TAKE 1 TABLET (6.25 MG TOTAL) BY MOUTH 2 (TWO) TIMES DAILY WITH A MEAL. 180 tablet 0  . rosuvastatin (CRESTOR) 40 MG tablet Take 1 tablet (40 mg total) by mouth daily. 30 tablet 6  . spironolactone (ALDACTONE) 25 MG tablet Take 0.5 tablets (12.5 mg total) by mouth daily. 45 tablet 3  . telmisartan (MICARDIS) 80 MG tablet Take 80 mg by mouth daily.      No current facility-administered medications for this visit.    Allergies:   Patient has no known allergies.    Social History:  The patient  reports that he has never smoked. He has never used smokeless tobacco. He reports current alcohol use. He reports that he does not use drugs.   Family History:  The patient's family history includes Hypertension in his father and  mother; Multiple myeloma in his father.    ROS:  Please see the history of present illness.   Otherwise, review of systems are positive for none.   All other systems are reviewed and negative.    PHYSICAL EXAM: VS:  BP 100/70 (BP Location: Left Arm, Patient Position: Sitting, Cuff Size: Large)   Pulse 95   Ht 6' (1.829 m)   Wt 264 lb 6 oz (119.9 kg)   SpO2 98%   BMI 35.86 kg/m  , BMI Body mass index is 35.86 kg/m. GEN: Well nourished, well developed, in no acute distress  HEENT: normal  Neck: no JVD, carotid bruits, or masses Cardiac: RRR; no murmurs, rubs, or gallops,no edema  Respiratory:  clear to auscultation bilaterally, normal  work of breathing GI: soft, nontender, nondistended, + BS MS: no deformity or atrophy  Skin: warm and dry, no rash Neuro:  Strength and sensation are intact Psych: euthymic mood, full affect   EKG:  EKG is ordered today. The ekg ordered today demonstrates normal sinus rhythm with a PVC, poor R wave progression in the anterior leads with left axis deviation.  Recent Labs: 12/12/2019: ALT 31; BUN 22; Creatinine, Ser 1.21; Hemoglobin 16.3; Platelets 185; Potassium 4.4; Sodium 138    Lipid Panel No results found for: CHOL, TRIG, HDL, CHOLHDL, VLDL, LDLCALC, LDLDIRECT    Wt Readings from Last 3 Encounters:  03/29/20 264 lb 6 oz (119.9 kg)  12/28/19 262 lb 4 oz (119 kg)  12/15/19 260 lb 11.1 oz (118.2 kg)      PAD Screen 04/22/2018  Previous PAD dx? No  Previous surgical procedure? No  Pain with walking? No  Feet/toe relief with dangling? No  Painful, non-healing ulcers? No  Extremities discolored? No      ASSESSMENT AND PLAN:  1.  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  No evidence of volume overload. Continue treatment with carvedilol, spironolactone and telmisartan.  He is going to have labs done in the near future.  2.  Essential hypertension: Blood pressure is well controlled.  3.  Hyperlipidemia: Currently on simvastatin.  4.  Coronary artery calcifications: Currently with no anginal symptoms.  5.  Mildly dilated aortic root: Aortic root was 42 mm by echo.  However, by CT scan the maximal measurement was 38 mm which is in the upper limit of normal considering his size.   Disposition:   FU with me in 6 months.  Signed,  [name], MD  03/29/2020 3:44 PM    [place]

## 2020-04-04 NOTE — Progress Notes (Signed)
[facility]  [address], [address] [place], [place] [address] Phone: [phone]  Fax: [phone]   Clinic Day:  04/05/2020  Referring physician: [name], MD  Chief Complaint: Ronnie Mcintyre is a 54 y.o. male with stage IBlarge B-cell lymphoma who is seen for 4 month assessment.    HPI: The patient was last seen in the medical oncology clinic on 12/15/2019. At that time, he was doing well.  He denied any fevers, sweats or weight loss.  He had a sensation of something in his throat.  He denied any sinus drainage.  Exam revealed no adenopathy or hepatosplenomegaly. Hematocrit was 47.6, hemoglobin 16.3, platelets 185,000, WBC 8,100.  Uric acid was 8.3. LDH was 144. We discussed continued surveillance.  During the interim, he has been "good." He still has some discomfort in his throat and a mild cough. These symptoms have been present since radiation. He denies fevers, sweats, weight loss, lumps or bumps, bruising or bleeding, chest pain, shortness of breath, orthopnea, leg swelling, or pain.  He follows with a cardiologist every 6 months. His most recent visit was last week and everything was fine.   Past Medical History:  Diagnosis Date  . Cancer ([place])   . Hypertension     Past Surgical History:  Procedure Laterality Date  . ANKLE SURGERY Right   . PORTACATH PLACEMENT Right 02/11/2018   Procedure: INSERTION PORT-A-CATH;  Surgeon: Jules Husbands, MD;  Location: [facility] ORS;  Service: General;  Laterality: Right;    Family History  Problem Relation Age of Onset  . Hypertension Mother   . Hypertension Father   . Multiple myeloma Father     Social History:  reports that he has never smoked. He has never used smokeless tobacco. He reports current alcohol use. He reports that he does not use drugs. He drinks beer 1-2x/week.He is walkingfor an hour, about2 miles/day.He has a basset hound puppy named Georgie. He works for [employer],  about 55-60 hours/week. He lives in [place]. The patient is alone today.   Allergies: No Known Allergies  Current Medications: Current Outpatient Medications  Medication Sig Dispense Refill  . acetaminophen (TYLENOL) 325 MG tablet Take 2 tablets (650 mg total) by mouth every 6 (six) hours as needed for mild pain (or Fever >/= 101).    [name] aspirin 81 MG tablet Take 81 mg by mouth daily.     . carvedilol (COREG) 6.25 MG tablet TAKE 1 TABLET (6.25 MG TOTAL) BY MOUTH 2 (TWO) TIMES DAILY WITH A MEAL. 180 tablet 0  . rosuvastatin (CRESTOR) 40 MG tablet Take 1 tablet (40 mg total) by mouth daily. 30 tablet 6  . spironolactone (ALDACTONE) 25 MG tablet Take 0.5 tablets (12.5 mg total) by mouth daily. 45 tablet 3  . telmisartan (MICARDIS) 80 MG tablet Take 80 mg by mouth daily.      No current facility-administered medications for this visit.   Facility-Administered Medications Ordered in Other Visits  Medication Dose Route Frequency Provider Last Rate Last Admin  . sodium chloride flush (NS) 0.9 % injection 10 mL  10 mL Intravenous PRN Lequita Asal, MD   10 mL at 04/05/20 7473   Review of Systems  Constitutional: Negative for chills, diaphoresis, fever, malaise/fatigue and weight loss (up 2 lbs).       Feels "good."  HENT: Negative for congestion, ear discharge, ear pain, hearing loss, nosebleeds, sinus pain, sore throat and tinnitus.  Discomfort in throat s/p radiation  Eyes: Negative.  Negative for blurred vision, double vision and photophobia.  Respiratory: Positive for cough (s/p radiation). Negative for hemoptysis, sputum production, shortness of breath and wheezing.   Cardiovascular: Negative.  Negative for chest pain, palpitations, orthopnea, leg swelling and PND.  Gastrointestinal: Negative.  Negative for abdominal pain, blood in stool, constipation, diarrhea, heartburn, nausea and vomiting.  Genitourinary: Negative.  Negative for dysuria, frequency, hematuria and urgency.   Musculoskeletal: Negative.  Negative for back pain, joint pain, myalgias and neck pain.  Skin: Negative.  Negative for itching and rash.  Neurological: Negative.  Negative for dizziness, tingling, sensory change, speech change, focal weakness, weakness and headaches.  Endo/Heme/Allergies: Negative.  Does not bruise/bleed easily.  Psychiatric/Behavioral: Negative.  Negative for depression and memory loss. The patient is not nervous/anxious and does not have insomnia.   All other systems reviewed and are negative.  Performance status (ECOG):  0  Vitals Blood pressure 100/77, pulse 78, temperature 97.8 F (36.6 C), temperature source Tympanic, resp. rate 18, height 6' (1.829 m), weight 262 lb 14.4 oz (119.3 kg), SpO2 100 %.   Physical Exam Vitals and nursing note reviewed.  Constitutional:      General: He is not in acute distress.    Appearance: He is well-developed. He is not diaphoretic.  HENT:     Head: Normocephalic and atraumatic.     Comments: Short dark hair with graying.  Male pattern baldness. Eyes:     General: No scleral icterus.    Conjunctiva/sclera: Conjunctivae normal.     Right eye: No chemosis.    Pupils: Pupils are equal, round, and reactive to light.     Comments: Blue eyes.  Neck:     Vascular: No JVD.  Cardiovascular:     Rate and Rhythm: Normal rate and regular rhythm.     Heart sounds: Normal heart sounds. No murmur heard.   Pulmonary:     Effort: Pulmonary effort is normal. No respiratory distress.     Breath sounds: Normal breath sounds. No wheezing or rales.  Chest:     Chest wall: No tenderness.  Abdominal:     General: Bowel sounds are normal. There is no distension.     Palpations: Abdomen is soft. There is no hepatomegaly, splenomegaly or mass.     Tenderness: There is no abdominal tenderness. There is no guarding or rebound.  Musculoskeletal:        General: No tenderness. Normal range of motion.     Cervical back: Normal range of motion and  neck supple.  Lymphadenopathy:     Head:     Right side of head: No preauricular, posterior auricular or occipital adenopathy.     Left side of head: No preauricular, posterior auricular or occipital adenopathy.     Cervical: No cervical adenopathy.     Upper Body:     Right upper body: No supraclavicular or axillary adenopathy.     Left upper body: No supraclavicular or axillary adenopathy.     Lower Body: No right inguinal adenopathy. No left inguinal adenopathy.  Skin:    General: Skin is warm and dry.  Neurological:     Mental Status: He is alert and oriented to person, place, and time.  Psychiatric:        Behavior: Behavior normal.        Thought Content: Thought content normal.        Judgment: Judgment normal.    Imaging studies: 01/15/2018:  Chest CT angiogramrevealed an 11.3 x 8.0 cm anterior mediastinal mass. 01/16/2018:  Abdomen and pelvic CT on 01/16/2018 revealeda 6.3 x 5.6 cm soft tissue mass or medium density fluid filling the urinary bladder.There was a1.5 cm mildly complex lower pole right renal cyst.There was a1.9 cm indeterminate left adrenal mass.There was a small pericardial effusion.  01/25/2018:  Renal ultrasoundrevealed no evidence of bladder mass. Finding on prior CT exam likely represented excretion of residual contrast material from a CTA chest exam performed 1 day prior  01/26/2018:  PET scanrevealed an 11.1 cm intensely hypermetabolic and partially necrotic right anterior mediastinal mass, compatible with malignancy. There was diffuse marrow hypermetabolism, nonspecific, cannot exclude an infiltrative neoplastic marrow process. There was no focal skeletal hypermetabolism. There was no focal bone lesions on the CT images. There was nonspecific mild heterogeneous prostatic hypermetabolism. 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  04/19/2018:  Chest, abdomen, and pelvic CTrevealed  the anterior mediastinal mass had significantly reduced in size (10.1 x 6.9 cm to 6.8 by 4.5 cm). The smaller left eccentric mediastinal lymph node measured 1.4 x 4.7 cm, previously 2.0 x 3.2 cm. There was no appreciable adenopathy in the abdomen/pelvis. There were bilateral hypodense renal lesions technically too small to characterize, including the right kidney lower pole lesion.  07/02/2018:  PET scanrevealed clear interval response to therapy with decrease in size and hypermetabolism of the anterior mediastinal mass (6.9 x 11.1 cm to 2 x 2.6 cm; SUV 14.3 to 5.3). There was a similar appearance of the diffuse marrow hypermetabolism, nonspecific. There was a 4.2 cm ascending thoracic aortic diameter. There was a stable left adrenal adenoma. 11/18/2018:  PET scanrevealed continued decrease in metabolic activity of anterior mediastinal mass ([place] 3), no evidence of lymphoma progression on skull base to thigh, and new focus of metabolic airspace disease in the medialrightlower lobe favored focus of pulmonary infection (pneumonia). 05/16/2019:  Chest, abdomen and pelvis CT revealed continued interval decrease in size of minimal residual anterior mediastinal soft tissue density (1.6 x 3.2 cm compared to 2.2 x 4.2 cm).  There were no new findings in the chest and no evidence of metastatic disease within the abdomen/pelvis. There were a small bilateral renal cysts. There was a stable 1.36 cm left adrenal nodule likely adenoma. There was a mild ectasia of the ascending thoracic aorta measuring 3.8 cm in AP diameter unchanged. Recommend annual imaging follow up by CTA or MRA. 11/24/2019:  Chest, abdomen and pelvis CT revealed continued interval decrease in size of the anterior mediastinal soft tissue, likely treated tumor. There were no new or progressive findings.  There was no new areas of lymphadenopathy in the chest, abdomen or pelvis.  There was stable age advanced three-vessel coronary artery  calcifications and a stable small left adrenal gland adenoma.   Infusion on 04/05/2020  Component Date Value Ref Range Status  . WBC 04/05/2020 7.6  4.0 - 10.5 K/uL Final  . RBC 04/05/2020 4.91  4.22 - 5.81 MIL/uL Final  . Hemoglobin 04/05/2020 15.1  13.0 - 17.0 g/dL Final  . HCT 04/05/2020 43.1  39 - 52 % Final  . MCV 04/05/2020 87.8  80.0 - 100.0 fL Final  . MCH 04/05/2020 30.8  26.0 - 34.0 pg Final  . MCHC 04/05/2020 35.0  30.0 - 36.0 g/dL Final  . RDW 04/05/2020 14.4  11.5 - 15.5 % Final  . Platelets 04/05/2020 222  150 - 400 K/uL Final  .  nRBC 04/05/2020 0.0  0.0 - 0.2 % Final  . Neutrophils Relative % 04/05/2020 76  % Final  . Neutro Abs 04/05/2020 5.9  1.7 - 7.7 K/uL Final  . Lymphocytes Relative 04/05/2020 12  % Final  . Lymphs Abs 04/05/2020 0.9  0.7 - 4.0 K/uL Final  . Monocytes Relative 04/05/2020 7  % Final  . Monocytes Absolute 04/05/2020 0.5  0 - 1 K/uL Final  . Eosinophils Relative 04/05/2020 3  % Final  . Eosinophils Absolute 04/05/2020 0.2  0 - 0 K/uL Final  . Basophils Relative 04/05/2020 1  % Final  . Basophils Absolute 04/05/2020 0.1  0 - 0 K/uL Final  . Immature Granulocytes 04/05/2020 1  % Final  . Abs Immature Granulocytes 04/05/2020 0.04  0.00 - 0.07 K/uL Final   Performed at [facility], [address] [place], [place] [address]    Assessment:  HYKEEM OJEDA is a 54 y.o. 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 [facility]reve[facility] necrotic [facility]. [facility] consultationrevealed neoplastic cells are positive for CD20, BCL-6, MUM-1, and BCL-2. The neoplastic cells were negative for CD30, CD3, , CD10, c-MYC, EBV ISH, AE1/3, and PAX-8. Ki67 is 60-70%. 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 The absence of CD30 expression makes primary mediastinal large B-cell lymphoma less likely. FISH for MYC rearrangement was negative.IPI score, age  adjusted IPI, are low (1) and NCCN IPI scoreis low-intermediate (2).  He presented with upper chest fullness with radiation to his neck and arm. He has had B symptoms (sweats and weight loss) worrisome for lymphoma. LDHis 273. Uric acid was normal. Beta-HCG and AFP were normal on 01/16/2018.  PET scanon 01/26/2018 revealed an 11.1 cm intensely hypermetabolic and partially necrotic right anterior mediastinal mass, compatible with malignancy. There was diffuse marrow hypermetabolism, nonspecific, cannot exclude an infiltrative neoplastic marrow process. There was no focal skeletal hypermetabolism. There was no focal bone lesions on the CT images. There was nonspecific mild heterogeneous prostatic hypermetabolism. 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   Bone marrow biopsyon 02/03/2018 revealed a slightly hypercellular bone marrow for age with trilineage hematopoiesis. There was no monoclonal B-cell population or abnormal T-cell phenotype identified. Cytogenetics were normal (46, XY).  Echoon 01/15/2018 revealed an EF of 55-60%. Echoon 04/22/2018 revealed an EF of 50 to 55%. There was evidence of probablesevere hypokinesisof the mid apical-anteroseptal, anterior, and apical myocardium. Treadmill Myoviewon 04/26/2018 revealed a low risk study with a small defect present in the apex location that was partially reversible and could represent small previous myocardial infarction. However, and apical thinning artifact could not be excluded. Nuclear stress EF: 50%. Echoon 05/17/2018 revealed an EF of 50-55%.Echoon 08/31/2018 revealed an ejection fraction of 40-45%.  Echo on 10/27/2019 revealed an EF of 40-45%.  Hepatitis B, hepatitis C, and HIV testing was negative on 02/10/2018. G6PD assay was normal.  He received 6 cycles of RCHOPchemotherapy (02/16/2018 - 06/10/2018) with Neulasta support. He received  radiation(3600 cGy in 18 fractions) from 07/27/2018 - 08/19/2018.  Chest, abdomen and pelvis CT on 12/14/2019 revealed continued interval decrease in size of the anterior mediastinal soft tissue, likely treated tumor. There were no new or progressive findings.  There was no new areas of lymphadenopathy in the chest, abdomen or pelvis.  There was stable age advanced three-vessel coronary artery calcifications and a stable small left adrenal gland  adenoma.  He was diagnosed with T6-T7herpes zosteron 03/19/2018. He was treated with valacyclovir.   He received the Moderna COVID-19 vaccine on 02/29/2020 and 03/28/2020.  Symptomatically, he is doing well.  He denies any fevers, sweats or weight loss.  Exam reveals no adenopathy or hepatosplenomegaly.  Plan: 1.   Labs today: CBC with diff, CMP, LDH, uric acid. 2.Diffuse large B cell lymphoma Clinically, he is doing well Chest, abdomen and pelvic CT on 12/14/2019 revealed no evidence of recurrent disease.  Continue surveillance. 3.Cardiac wall motion abnormalities Clinically, he denies any symptoms of CHF. Echo on 10/27/2019 revealed an EF of 40-45%. Patient seen recently by cardiology; no concerns. 4.   Abnormal throat sensation  Patient feels sensation is related to radiation.  Continue to monitor. 5.Port-a-cath  Port-a-Cath flush every 6-12 weeks.  Consider removal after next scans. 6.   Chest, abdomen, and pelvis CT on 06/19/2020. 7.   Port-a-cath flush every 6-12 weeks. 8.   RTC labs pre CT:  CBC with diff, CMP, LDH, uric acid. 9.   RTC after CT scan for MD assessment and review of imaging.  I discussed the assessment and treatment plan with the patient.  The patient was provided an opportunity to ask questions and all were answered.  The patient agreed with the plan and demonstrated an understanding of the instructions.  The patient was advised to call back if the symptoms worsen or if the condition fails to improve as  anticipated.  I provided 14 minutes of face-to-face time during this this encounter and > 50% was spent counseling as documented under my assessment and plan.   Lequita Asal, MD, PhD    04/05/2020, 9:36 AM   I, Evert Kohl, am acting as scribe for Lequita Asal, MD, PhD.   I, Drue Second. Mike Gip, MD, have reviewed the above documentation for accuracy and completeness, and I agree with the above.

## 2020-04-05 ENCOUNTER — Inpatient Hospital Stay (HOSPITAL_BASED_OUTPATIENT_CLINIC_OR_DEPARTMENT_OTHER): Payer: Commercial Managed Care - PPO | Admitting: Hematology and Oncology

## 2020-04-05 ENCOUNTER — Other Ambulatory Visit: Payer: Commercial Managed Care - PPO

## 2020-04-05 ENCOUNTER — Other Ambulatory Visit: Payer: Self-pay

## 2020-04-05 ENCOUNTER — Inpatient Hospital Stay: Payer: Commercial Managed Care - PPO | Attending: Hematology and Oncology

## 2020-04-05 ENCOUNTER — Ambulatory Visit: Payer: Commercial Managed Care - PPO | Admitting: Hematology and Oncology

## 2020-04-05 ENCOUNTER — Encounter: Payer: Self-pay | Admitting: Hematology and Oncology

## 2020-04-05 VITALS — BP 100/77 | HR 78 | Temp 97.8°F | Resp 18 | Ht 72.0 in | Wt 262.9 lb

## 2020-04-05 DIAGNOSIS — I10 Essential (primary) hypertension: Secondary | ICD-10-CM | POA: Diagnosis not present

## 2020-04-05 DIAGNOSIS — C8580 Other specified types of non-Hodgkin lymphoma, unspecified site: Secondary | ICD-10-CM

## 2020-04-05 DIAGNOSIS — I2510 Atherosclerotic heart disease of native coronary artery without angina pectoris: Secondary | ICD-10-CM | POA: Insufficient documentation

## 2020-04-05 DIAGNOSIS — Z79899 Other long term (current) drug therapy: Secondary | ICD-10-CM | POA: Insufficient documentation

## 2020-04-05 DIAGNOSIS — I427 Cardiomyopathy due to drug and external agent: Secondary | ICD-10-CM

## 2020-04-05 DIAGNOSIS — T451X5A Adverse effect of antineoplastic and immunosuppressive drugs, initial encounter: Secondary | ICD-10-CM

## 2020-04-05 DIAGNOSIS — C8330 Diffuse large B-cell lymphoma, unspecified site: Secondary | ICD-10-CM | POA: Insufficient documentation

## 2020-04-05 DIAGNOSIS — Z95828 Presence of other vascular implants and grafts: Secondary | ICD-10-CM

## 2020-04-05 DIAGNOSIS — C8510 Unspecified B-cell lymphoma, unspecified site: Secondary | ICD-10-CM

## 2020-04-05 DIAGNOSIS — D3502 Benign neoplasm of left adrenal gland: Secondary | ICD-10-CM | POA: Insufficient documentation

## 2020-04-05 LAB — COMPREHENSIVE METABOLIC PANEL
ALT: 37 U/L (ref 0–44)
AST: 24 U/L (ref 15–41)
Albumin: 3.9 g/dL (ref 3.5–5.0)
Alkaline Phosphatase: 58 U/L (ref 38–126)
Anion gap: 6 (ref 5–15)
BUN: 16 mg/dL (ref 6–20)
CO2: 27 mmol/L (ref 22–32)
Calcium: 8.7 mg/dL — ABNORMAL LOW (ref 8.9–10.3)
Chloride: 103 mmol/L (ref 98–111)
Creatinine, Ser: 1.16 mg/dL (ref 0.61–1.24)
GFR calc Af Amer: >60 mL/min (ref >60)
GFR calc non Af Amer: >60 mL/min (ref >60)
Glucose, Bld: 111 mg/dL — ABNORMAL HIGH (ref 70–99)
Potassium: 4.5 mmol/L (ref 3.5–5.1)
Sodium: 136 mmol/L (ref 135–145)
Total Bilirubin: 1 mg/dL (ref 0.3–1.2)
Total Protein: 6.6 g/dL (ref 6.5–8.1)

## 2020-04-05 LAB — CBC WITH DIFFERENTIAL/PLATELET
Abs Immature Granulocytes: 0.04 10*3/uL (ref 0.00–0.07)
Basophils Absolute: 0.1 10*3/uL (ref 0.0–0.1)
Basophils Relative: 1 %
Eosinophils Absolute: 0.2 10*3/uL (ref 0.0–0.5)
Eosinophils Relative: 3 %
HCT: 43.1 % (ref 39.0–52.0)
Hemoglobin: 15.1 g/dL (ref 13.0–17.0)
Immature Granulocytes: 1 %
Lymphocytes Relative: 12 %
Lymphs Abs: 0.9 10*3/uL (ref 0.7–4.0)
MCH: 30.8 pg (ref 26.0–34.0)
MCHC: 35 g/dL (ref 30.0–36.0)
MCV: 87.8 fL (ref 80.0–100.0)
Monocytes Absolute: 0.5 10*3/uL (ref 0.1–1.0)
Monocytes Relative: 7 %
Neutro Abs: 5.9 10*3/uL (ref 1.7–7.7)
Neutrophils Relative %: 76 %
Platelets: 222 10*3/uL (ref 150–400)
RBC: 4.91 MIL/uL (ref 4.22–5.81)
RDW: 14.4 % (ref 11.5–15.5)
WBC: 7.6 10*3/uL (ref 4.0–10.5)
nRBC: 0 % (ref 0.0–0.2)

## 2020-04-05 LAB — URIC ACID: Uric Acid, Serum: 6.7 mg/dL (ref 3.7–8.6)

## 2020-04-05 LAB — LACTATE DEHYDROGENASE: LDH: 149 U/L (ref 98–192)

## 2020-04-05 MED ORDER — SODIUM CHLORIDE 0.9% FLUSH
10.0000 mL | INTRAVENOUS | Status: DC | PRN
  Administered 2020-04-05: 10 mL via INTRAVENOUS
  Filled 2020-04-05: qty 10

## 2020-04-05 MED ORDER — HEPARIN SOD (PORK) LOCK FLUSH 100 UNIT/ML IV SOLN
500.0000 [IU] | Freq: Once | INTRAVENOUS | Status: AC
  Administered 2020-04-05: 500 [IU] via INTRAVENOUS
  Filled 2020-04-05: qty 5

## 2020-04-05 NOTE — Progress Notes (Signed)
No new changes noted today 

## 2020-05-13 ENCOUNTER — Other Ambulatory Visit: Payer: Self-pay | Admitting: Cardiovascular Disease

## 2020-06-13 ENCOUNTER — Other Ambulatory Visit: Payer: Self-pay | Admitting: Hematology and Oncology

## 2020-06-18 ENCOUNTER — Other Ambulatory Visit: Payer: Commercial Managed Care - PPO

## 2020-06-18 ENCOUNTER — Ambulatory Visit: Admission: RE | Admit: 2020-06-18 | Payer: Commercial Managed Care - PPO | Source: Ambulatory Visit

## 2020-06-19 ENCOUNTER — Inpatient Hospital Stay: Payer: Commercial Managed Care - PPO | Attending: Hematology and Oncology

## 2020-06-19 ENCOUNTER — Other Ambulatory Visit: Payer: Self-pay

## 2020-06-19 ENCOUNTER — Ambulatory Visit: Payer: Commercial Managed Care - PPO | Admitting: Hematology and Oncology

## 2020-06-19 ENCOUNTER — Ambulatory Visit
Admission: RE | Admit: 2020-06-19 | Discharge: 2020-06-19 | Disposition: A | Discharge status: Home / Self Care | Payer: Commercial Managed Care - PPO | Source: Ambulatory Visit | Attending: Hematology and Oncology | Admitting: Hematology and Oncology

## 2020-06-19 DIAGNOSIS — I427 Cardiomyopathy due to drug and external agent: Secondary | ICD-10-CM

## 2020-06-19 DIAGNOSIS — C8510 Unspecified B-cell lymphoma, unspecified site: Secondary | ICD-10-CM | POA: Insufficient documentation

## 2020-06-19 DIAGNOSIS — T451X5A Adverse effect of antineoplastic and immunosuppressive drugs, initial encounter: Secondary | ICD-10-CM

## 2020-06-19 DIAGNOSIS — Z95828 Presence of other vascular implants and grafts: Secondary | ICD-10-CM

## 2020-06-19 LAB — CBC WITH DIFFERENTIAL/PLATELET
Abs Immature Granulocytes: 0.06 10*3/uL (ref 0.00–0.07)
Basophils Absolute: 0.1 10*3/uL (ref 0.0–0.1)
Basophils Relative: 1 %
Eosinophils Absolute: 0.2 10*3/uL (ref 0.0–0.5)
Eosinophils Relative: 3 %
HCT: 47.2 % (ref 39.0–52.0)
Hemoglobin: 16 g/dL (ref 13.0–17.0)
Immature Granulocytes: 1 %
Lymphocytes Relative: 7 %
Lymphs Abs: 0.7 10*3/uL (ref 0.7–4.0)
MCH: 29.6 pg (ref 26.0–34.0)
MCHC: 33.9 g/dL (ref 30.0–36.0)
MCV: 87.4 fL (ref 80.0–100.0)
Monocytes Absolute: 0.6 10*3/uL (ref 0.1–1.0)
Monocytes Relative: 7 %
Neutro Abs: 7.5 10*3/uL (ref 1.7–7.7)
Neutrophils Relative %: 81 %
Platelets: 189 10*3/uL (ref 150–400)
RBC: 5.4 MIL/uL (ref 4.22–5.81)
RDW: 14.6 % (ref 11.5–15.5)
WBC: 9.1 10*3/uL (ref 4.0–10.5)
nRBC: 0 % (ref 0.0–0.2)

## 2020-06-19 LAB — COMPREHENSIVE METABOLIC PANEL
ALT: 31 U/L (ref 0–44)
AST: 23 U/L (ref 15–41)
Albumin: 3.7 g/dL (ref 3.5–5.0)
Alkaline Phosphatase: 61 U/L (ref 38–126)
Anion gap: 8 (ref 5–15)
BUN: 15 mg/dL (ref 6–20)
CO2: 26 mmol/L (ref 22–32)
Calcium: 8.8 mg/dL — ABNORMAL LOW (ref 8.9–10.3)
Chloride: 102 mmol/L (ref 98–111)
Creatinine, Ser: 1.14 mg/dL (ref 0.61–1.24)
GFR, Estimated: >60 mL/min (ref >60)
Glucose, Bld: 112 mg/dL — ABNORMAL HIGH (ref 70–99)
Potassium: 4.1 mmol/L (ref 3.5–5.1)
Sodium: 136 mmol/L (ref 135–145)
Total Bilirubin: 0.9 mg/dL (ref 0.3–1.2)
Total Protein: 6.6 g/dL (ref 6.5–8.1)

## 2020-06-19 LAB — RASBURICASE - URIC ACID: Uric Acid, Serum: 6.8 mg/dL (ref 3.7–8.6)

## 2020-06-19 LAB — LACTATE DEHYDROGENASE: LDH: 156 U/L (ref 98–192)

## 2020-06-19 LAB — URIC ACID: Uric Acid, Serum: 6.7 mg/dL (ref 3.7–8.6)

## 2020-06-19 MED ORDER — HEPARIN SOD (PORK) LOCK FLUSH 100 UNIT/ML IV SOLN
500.0000 [IU] | Freq: Once | INTRAVENOUS | Status: AC
  Administered 2020-06-19: 500 [IU] via INTRAVENOUS
  Filled 2020-06-19: qty 5

## 2020-06-19 MED ORDER — SODIUM CHLORIDE 0.9% FLUSH
10.0000 mL | INTRAVENOUS | Status: DC | PRN
  Administered 2020-06-19: 10 mL via INTRAVENOUS
  Filled 2020-06-19: qty 10

## 2020-06-19 MED ORDER — IOHEXOL 300 MG/ML  SOLN
150.0000 mL | Freq: Once | INTRAMUSCULAR | Status: AC | PRN
  Administered 2020-06-19: 125 mL via INTRAVENOUS

## 2020-06-19 NOTE — Progress Notes (Signed)
[facility]  [address], [address] [place], [place] [address] Phone: [phone]  Fax: [phone]   Clinic Day:  06/20/2020  Referring physician: [name], MD  Chief Complaint: Ronnie Mcintyre is a 54 y.o. male with stage IBlarge B-cell lymphoma who is seen for review of interval CT scans and 3 month assessment.    HPI: The patient was last seen in the medical oncology clinic on 04/05/2020. At that time, he is doing well.  He denies any fevers, sweats or weight loss.  Exam reveals no adenopathy or hepatosplenomegaly. Hematocrit was 43.1, hemoglobin 15.1, platelets 222,000, WBC 7,600. Calcium was 8.7. Uric acid was 6.7. LDH was 149. We discussed continued surveillance.  Chest, abdomen, and pelvis CT with contrast on 06/19/2020 revealed stable to slight decrease in size of the amorphous soft tissue in the anterior mediastinum. There were no new or progressive findings in the chest, abdomen, or pelvis to suggest progressive or recurrent disease. There was a stable 17 mm left adrenal adenoma. There was bilateral renal cortical scarring with bilateral renal cysts. There was aortic atherosclerosis.  During the interim, he has been good. He still has a cough s/p radiation. His throat discomfort has resolved.  The patient is following up with cardiology in 09/2020. He denies orthopnea, chest pain, and shortness of breath.  He would like to hold off on having his port removed for now.   Past Medical History:  Diagnosis Date  . Cancer ([place]) 12/2017  . Hypertension     Past Surgical History:  Procedure Laterality Date  . ANKLE SURGERY Right   . PORTACATH PLACEMENT Right 02/11/2018   Procedure: INSERTION PORT-A-CATH;  Surgeon: Jules Husbands, MD;  Location: [facility] ORS;  Service: General;  Laterality: Right;    Family History  Problem Relation Age of Onset  . Hypertension Mother   . Hypertension Father   . Multiple myeloma Father     Social History:   reports that he has never smoked. He has never used smokeless tobacco. He reports current alcohol use. He reports that he does not use drugs. He drinks beer 1-2x/week.He is walkingfor an hour, about2 miles/day.He has a basset hound puppy named Georgie. He works for [employer], about 55-60 hours/week. He lives in [place]. The patient is alone today.   Allergies: No Known Allergies  Current Medications: Current Outpatient Medications  Medication Sig Dispense Refill  . acetaminophen (TYLENOL) 325 MG tablet Take 2 tablets (650 mg total) by mouth every 6 (six) hours as needed for mild pain (or Fever >/= 101).    [name] aspirin 81 MG tablet Take 81 mg by mouth daily.     . carvedilol (COREG) 6.25 MG tablet TAKE 1 TABLET (6.25 MG TOTAL) BY MOUTH 2 (TWO) TIMES DAILY WITH A MEAL. 180 tablet 1  . rosuvastatin (CRESTOR) 40 MG tablet Take 1 tablet (40 mg total) by mouth daily. 30 tablet 6  . spironolactone (ALDACTONE) 25 MG tablet Take 0.5 tablets (12.5 mg total) by mouth daily. 45 tablet 3  . telmisartan (MICARDIS) 80 MG tablet Take 80 mg by mouth daily.      No current facility-administered medications for this visit.   Review of Systems  Constitutional: Negative for chills, diaphoresis, fever, malaise/fatigue and weight loss (up 8 lbs).       Feels "good."  HENT: Negative for congestion, ear discharge, ear pain, hearing loss, nosebleeds, sinus pain, sore throat and tinnitus.   Eyes: Negative.  Negative for blurred  vision, double vision and photophobia.  Respiratory: Positive for cough (s/p radiation). Negative for hemoptysis, sputum production, shortness of breath and wheezing.   Cardiovascular: Negative.  Negative for chest pain, palpitations, orthopnea, leg swelling and PND.  Gastrointestinal: Negative.  Negative for abdominal pain, blood in stool, constipation, diarrhea, heartburn, nausea and vomiting.  Genitourinary: Negative.  Negative for dysuria, frequency, hematuria and urgency.   Musculoskeletal: Negative.  Negative for back pain, joint pain, myalgias and neck pain.  Skin: Negative.  Negative for itching and rash.  Neurological: Negative.  Negative for dizziness, tingling, sensory change, speech change, focal weakness, weakness and headaches.  Endo/Heme/Allergies: Negative.  Does not bruise/bleed easily.  Psychiatric/Behavioral: Negative.  Negative for depression and memory loss. The patient is not nervous/anxious and does not have insomnia.   All other systems reviewed and are negative.  Performance status (ECOG):  0  Vitals Blood pressure 120/90, pulse 83, temperature 98.3 F (36.8 C), temperature source Oral, resp. rate 16, height 6' (1.829 m), weight 270 lb 1 oz (122.5 kg).   Physical Exam Vitals and nursing note reviewed.  Constitutional:      General: He is not in acute distress.    Appearance: He is well-developed. He is not diaphoretic.  HENT:     Head: Normocephalic and atraumatic.     Comments: Blue cap.    Mouth/Throat:     Mouth: Mucous membranes are moist.     Pharynx: Oropharynx is clear.  Eyes:     General: No scleral icterus.    Extraocular Movements: Extraocular movements intact.     Conjunctiva/sclera: Conjunctivae normal.     Right eye: No chemosis.    Pupils: Pupils are equal, round, and reactive to light.     Comments: Blue eyes.  Neck:     Vascular: No JVD.  Cardiovascular:     Rate and Rhythm: Normal rate and regular rhythm.     Heart sounds: Normal heart sounds. No murmur heard.   Pulmonary:     Effort: Pulmonary effort is normal. No respiratory distress.     Breath sounds: Normal breath sounds. No wheezing or rales.  Chest:     Chest wall: No tenderness.  Abdominal:     General: Bowel sounds are normal. There is no distension.     Palpations: Abdomen is soft. There is no hepatomegaly, splenomegaly or mass.     Tenderness: There is no abdominal tenderness. There is no guarding or rebound.  Musculoskeletal:         General: No tenderness. Normal range of motion.     Cervical back: Normal range of motion and neck supple.  Lymphadenopathy:     Head:     Right side of head: No preauricular, posterior auricular or occipital adenopathy.     Left side of head: No preauricular, posterior auricular or occipital adenopathy.     Cervical: No cervical adenopathy.     Upper Body:     Right upper body: No supraclavicular or axillary adenopathy.     Left upper body: No supraclavicular or axillary adenopathy.     Lower Body: No right inguinal adenopathy. No left inguinal adenopathy.  Skin:    General: Skin is warm and dry.  Neurological:     Mental Status: He is alert and oriented to person, place, and time.  Psychiatric:        Behavior: Behavior normal.        Thought Content: Thought content normal.  Judgment: Judgment normal.    Imaging studies: 01/15/2018:  Chest CT angiogramrevealed an 11.3 x 8.0 cm anterior mediastinal mass. 01/16/2018:  Abdomen and pelvic CT on 01/16/2018 revealeda 6.3 x 5.6 cm soft tissue mass or medium density fluid filling the urinary bladder.There was a1.5 cm mildly complex lower pole right renal cyst.There was a1.9 cm indeterminate left adrenal mass.There was a small pericardial effusion.  01/25/2018:  Renal ultrasoundrevealed no evidence of bladder mass. Finding on prior CT exam likely represented excretion of residual contrast material from a CTA chest exam performed 1 day prior  01/26/2018:  PET scanrevealed an 11.1 cm intensely hypermetabolic and partially necrotic right anterior mediastinal mass, compatible with malignancy. There was diffuse marrow hypermetabolism, nonspecific, cannot exclude an infiltrative neoplastic marrow process. There was no focal skeletal hypermetabolism. There was no focal bone lesions on the CT images. There was nonspecific mild heterogeneous prostatic hypermetabolism. 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  04/19/2018:  Chest, abdomen, and pelvic CTrevealed the anterior mediastinal mass had significantly reduced in size (10.1 x 6.9 cm to 6.8 by 4.5 cm). The smaller left eccentric mediastinal lymph node measured 1.4 x 4.7 cm, previously 2.0 x 3.2 cm. There was no appreciable adenopathy in the abdomen/pelvis. There were bilateral hypodense renal lesions technically too small to characterize, including the right kidney lower pole lesion.  07/02/2018:  PET scanrevealed clear interval response to therapy with decrease in size and hypermetabolism of the anterior mediastinal mass (6.9 x 11.1 cm to 2 x 2.6 cm; SUV 14.3 to 5.3). There was a similar appearance of the diffuse marrow hypermetabolism, nonspecific. There was a 4.2 cm ascending thoracic aortic diameter. There was a stable left adrenal adenoma. 11/18/2018:  PET scanrevealed continued decrease in metabolic activity of anterior mediastinal mass ([place] 3), no evidence of lymphoma progression on skull base to thigh, and new focus of metabolic airspace disease in the medialrightlower lobe favored focus of pulmonary infection (pneumonia). 05/16/2019:  Chest, abdomen and pelvis CT revealed continued interval decrease in size of minimal residual anterior mediastinal soft tissue density (1.6 x 3.2 cm compared to 2.2 x 4.2 cm).  There were no new findings in the chest and no evidence of metastatic disease within the abdomen/pelvis. There were a small bilateral renal cysts. There was a stable 1.36 cm left adrenal nodule likely adenoma. There was a mild ectasia of the ascending thoracic aorta measuring 3.8 cm in AP diameter unchanged. Recommend annual imaging follow up by CTA or MRA. 11/24/2019:  Chest, abdomen and pelvis CT revealed continued interval decrease in size of the anterior mediastinal soft tissue, likely treated tumor. There were no new or progressive findings.  There was no new areas of lymphadenopathy in  the chest, abdomen or pelvis.  There was stable age advanced three-vessel coronary artery calcifications and a stable small left adrenal gland adenoma. 06/19/2020:  Chest, abdomen, and pelvis CT revealed stable to slight decrease in size of the amorphous soft tissue in the anterior mediastinum. There were no new or progressive findings in the chest, abdomen, or pelvis to suggest progressive or recurrent disease. There was a stable 17 mm left adrenal adenoma. There was bilateral renal cortical scarring with bilateral renal cysts. There was aortic atherosclerosis.   Infusion on 06/19/2020  Component Date Value Ref Range Status  . Uric Acid, Serum 06/19/2020 6.8  3.7 - 8.6 mg/dL Final   Performed at [facility], [address],  [place], [place] [address]  . LDH 06/19/2020 156  98 - 192 U/L Final   Performed at [facility], [address], [place], [place] [address]  . Sodium 06/19/2020 136  135 - 145 mmol/L Final  . Potassium 06/19/2020 4.1  3.5 - 5.1 mmol/L Final  . Chloride 06/19/2020 102  98 - 111 mmol/L Final  . CO2 06/19/2020 26  22 - 32 mmol/L Final  . Glucose, Bld 06/19/2020 112* 70 - 99 mg/dL Final   Glucose reference range applies only to samples taken after fasting for at least 8 hours.  . BUN 06/19/2020 15  6 - 20 mg/dL Final  . Creatinine, Ser 06/19/2020 1.14  0.61 - 1.24 mg/dL Final  . Calcium 06/19/2020 8.8* 8.9 - 10.3 mg/dL Final  . Total Protein 06/19/2020 6.6  6.5 - 8.1 g/dL Final  . Albumin 06/19/2020 3.7  3.5 - 5.0 g/dL Final  . AST 06/19/2020 23  15 - 41 U/L Final  . ALT 06/19/2020 31  0 - 44 U/L Final  . Alkaline Phosphatase 06/19/2020 61  38 - 126 U/L Final  . Total Bilirubin 06/19/2020 0.9  0.3 - 1.2 mg/dL Final  . GFR, Estimated 06/19/2020 >60  >60 mL/min Final   Comment: (NOTE) Calculated using the CKD-EPI Creatinine Equation (2021)   . Anion gap 06/19/2020 8  5 - 15 Final   Performed at [facility], [address], [place], [place] [address]  . WBC 06/19/2020 9.1  4.0 - 10.5 K/uL Final  . RBC 06/19/2020 5.40  4.22 - 5.81 MIL/uL Final  . Hemoglobin 06/19/2020 16.0  13.0 - 17.0 g/dL Final  . HCT 06/19/2020 47.2  39 - 52 % Final  . MCV 06/19/2020 87.4  80.0 - 100.0 fL Final  . MCH 06/19/2020 29.6  26.0 - 34.0 pg Final  . MCHC 06/19/2020 33.9  30.0 - 36.0 g/dL Final  . RDW 06/19/2020 14.6  11.5 - 15.5 % Final  . Platelets 06/19/2020 189  150 - 400 K/uL Final  . nRBC 06/19/2020 0.0  0.0 - 0.2 % Final  . Neutrophils Relative % 06/19/2020 81  % Final  . Neutro Abs 06/19/2020 7.5  1.7 - 7.7 K/uL Final  . Lymphocytes Relative 06/19/2020 7  % Final  . Lymphs Abs 06/19/2020 0.7  0.7 - 4.0 K/uL Final  . Monocytes Relative 06/19/2020 7  % Final  . Monocytes Absolute 06/19/2020 0.6  0.1 - 1.0 K/uL Final  . Eosinophils Relative 06/19/2020 3  % Final  . Eosinophils Absolute 06/19/2020 0.2  0.0 - 0.5 K/uL Final  . Basophils Relative 06/19/2020 1  % Final  . Basophils Absolute 06/19/2020 0.1  0.0 - 0.1 K/uL Final  . Immature Granulocytes 06/19/2020 1  % Final  . Abs Immature Granulocytes 06/19/2020 0.06  0.00 - 0.07 K/uL Final   Performed at [facility], [address], [place], [place] [address]  . Uric Acid, Serum 06/19/2020 6.7  3.7 - 8.6 mg/dL Final   Performed at [facility], [address], [place], [place] [address]    Assessment:  Ronnie Mcintyre is a 54 y.o. 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 [facility]. [facility] consultationrevealed neoplastic cells are positive for CD20, BCL-6, MUM-1, and BCL-2. The neoplastic cells were negative for CD30, CD3, , CD10, c-MYC, EBV ISH, AE1/3, and PAX-8. Ki67 is 60-70%. These findings were consistent with a  large B-cell lymphoma with an activated B-cell phenotype. The absence of CD30 expression makes primary mediastinal large B-cell lymphoma less likely. FISH for MYC  rearrangement was negative.IPI score, age adjusted IPI, are low (1) and NCCN IPI scoreis low-intermediate (2).  He presented with upper chest fullness with radiation to his neck and arm. He has had B symptoms (sweats and weight loss) worrisome for lymphoma. LDHis 273. Uric acid was normal. Beta-HCG and AFP were normal on 01/16/2018.  PET scanon 01/26/2018 revealed an 11.1 cm intensely hypermetabolic and partially necrotic right anterior mediastinal mass, compatible with malignancy. There was diffuse marrow hypermetabolism, nonspecific, cannot exclude an infiltrative neoplastic marrow process. There was no focal skeletal hypermetabolism. There was no focal bone lesions on the CT images. There was nonspecific mild heterogeneous prostatic hypermetabolism. 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   Bone marrow biopsyon 02/03/2018 revealed a slightly hypercellular bone marrow for age with trilineage hematopoiesis. There was no monoclonal B-cell population or abnormal T-cell phenotype identified. Cytogenetics were normal (46, XY).  Echoon 01/15/2018 revealed an EF of 55-60%. Echoon 04/22/2018 revealed an EF of 50 to 55%. There was evidence of probablesevere hypokinesisof the mid apical-anteroseptal, anterior, and apical myocardium. Treadmill Myoviewon 04/26/2018 revealed a low risk study with a small defect present in the apex location that was partially reversible and could represent small previous myocardial infarction. However, and apical thinning artifact could not be excluded. Nuclear stress EF: 50%. Echoon 05/17/2018 revealed an EF of 50-55%.Echoon 08/31/2018 revealed an ejection fraction of 40-45%.  Echo on 10/27/2019 revealed an EF of 40-45%.  Hepatitis B, hepatitis C, and HIV testing was negative on 02/10/2018. G6PD assay was normal.  He received 6 cycles of RCHOPchemotherapy (02/16/2018 - 06/10/2018)  with Neulasta support. He received radiation(3600 cGy in 18 fractions) from 07/27/2018 - 08/19/2018.  Chest, abdomen, and pelvis CT on 06/19/2020 revealed stable to slight decrease in size of the amorphous soft tissue in the anterior mediastinum. There were no new or progressive findings in the chest, abdomen, or pelvis to suggest progressive or recurrent disease. There was a stable 17 mm left adrenal adenoma. There was bilateral renal cortical scarring with bilateral renal cysts.   He was diagnosed with T6-T7herpes zosteron 03/19/2018. He was treated with valacyclovir.   He received the Moderna COVID-19 vaccine on 02/29/2020 and 03/28/2020.  Symptomatically,  he has felt good. He still has a cough s/p radiation. His throat discomfort has resolved.  Exam reveals no adenopathy or hepatosplenomegaly.  Plan: 1.   Review labs from 06/19/2020 2.Diffuse large B cell lymphoma Clinically, he is doing well. Exam reveals no adenopathy or hepatosplenomegaly.  Chest, abdomen and pelvis CT on 06/19/2020 was personally reviewed. Agree with radiology findings.  No evidence of metastatic disease.  Continue surveillance. 3.Cardiac wall motion abnormalities Clinically, he denies any CHF symptoms. Echo on 10/27/2019 revealed an EF of 40-45%. He is followed by cardiology every 6 months. 4.   Port-a-cath  Port-a-Cath flush every 6-12 weeks.  Patient would like to maintain Port-A-Cath at this time. 5.   Port-a-cath flush every 6-12 weeks. 6.   CXR (PA and lateral) day before next appt. 7.   RTC in 6 months for MD assessment, labs (CBC with diff, CMP, LDH, uric acid), and review of CXR.  I discussed the assessment and treatment plan with the patient.  The patient was provided an opportunity to ask questions and all were answered.  The patient agreed with  the plan and demonstrated an understanding of the instructions.  The patient was advised to call back if the symptoms worsen or if the condition  fails to improve as anticipated.  I provided 16 minutes of face-to-face time during this this encounter and > 50% was spent counseling as documented under my assessment and plan.   Lequita Asal, MD, PhD    06/20/2020, 3:53 PM   I, Evert Kohl, am acting as scribe for Lequita Asal, MD, PhD.   I, Drue Second. Mike Gip, MD, have reviewed the above documentation for accuracy and completeness, and I agree with the above.

## 2020-06-20 ENCOUNTER — Encounter: Payer: Self-pay | Admitting: Hematology and Oncology

## 2020-06-20 ENCOUNTER — Inpatient Hospital Stay: Payer: Commercial Managed Care - PPO | Attending: Hematology and Oncology | Admitting: Hematology and Oncology

## 2020-06-20 VITALS — BP 120/90 | HR 83 | Temp 98.3°F | Resp 16 | Ht 72.0 in | Wt 270.1 lb

## 2020-06-20 DIAGNOSIS — Z95828 Presence of other vascular implants and grafts: Secondary | ICD-10-CM

## 2020-06-20 DIAGNOSIS — I10 Essential (primary) hypertension: Secondary | ICD-10-CM | POA: Insufficient documentation

## 2020-06-20 DIAGNOSIS — T451X5A Adverse effect of antineoplastic and immunosuppressive drugs, initial encounter: Secondary | ICD-10-CM

## 2020-06-20 DIAGNOSIS — I427 Cardiomyopathy due to drug and external agent: Secondary | ICD-10-CM | POA: Diagnosis not present

## 2020-06-20 DIAGNOSIS — C8510 Unspecified B-cell lymphoma, unspecified site: Secondary | ICD-10-CM

## 2020-06-20 DIAGNOSIS — Z79899 Other long term (current) drug therapy: Secondary | ICD-10-CM | POA: Diagnosis not present

## 2020-06-20 NOTE — Progress Notes (Signed)
Pt has no conerns today and he is here to get ct scan

## 2020-06-24 ENCOUNTER — Other Ambulatory Visit: Payer: Self-pay | Admitting: Physician Assistant

## 2020-09-12 ENCOUNTER — Other Ambulatory Visit: Payer: Self-pay

## 2020-09-12 ENCOUNTER — Inpatient Hospital Stay: Payer: Commercial Managed Care - PPO | Attending: Hematology and Oncology

## 2020-09-12 DIAGNOSIS — C8510 Unspecified B-cell lymphoma, unspecified site: Secondary | ICD-10-CM | POA: Insufficient documentation

## 2020-09-12 DIAGNOSIS — Z452 Encounter for adjustment and management of vascular access device: Secondary | ICD-10-CM | POA: Diagnosis not present

## 2020-09-12 DIAGNOSIS — Z95828 Presence of other vascular implants and grafts: Secondary | ICD-10-CM

## 2020-09-12 MED ORDER — SODIUM CHLORIDE 0.9% FLUSH
10.0000 mL | INTRAVENOUS | Status: DC | PRN
  Administered 2020-09-12: 10 mL via INTRAVENOUS
  Filled 2020-09-12: qty 10

## 2020-09-12 MED ORDER — HEPARIN SOD (PORK) LOCK FLUSH 100 UNIT/ML IV SOLN
500.0000 [IU] | Freq: Once | INTRAVENOUS | Status: AC
  Administered 2020-09-12: 500 [IU] via INTRAVENOUS
  Filled 2020-09-12: qty 5

## 2020-09-16 ENCOUNTER — Other Ambulatory Visit: Payer: Self-pay | Admitting: Physician Assistant

## 2020-10-29 ENCOUNTER — Other Ambulatory Visit: Payer: Self-pay | Admitting: Physician Assistant

## 2020-10-29 NOTE — Telephone Encounter (Signed)
Please contact pt for future appointment. Pt overdue for 6 month f/u.  

## 2020-10-29 NOTE — Telephone Encounter (Signed)
Attempted to schedule.  LMOV to call office.  ° °

## 2020-11-01 NOTE — Telephone Encounter (Signed)
Attempted to schedule. LVM

## 2020-11-02 MED ORDER — SPIRONOLACTONE 25 MG PO TABS
0.5000 | ORAL_TABLET | Freq: Every day | ORAL | 0 refills | Status: DC
Start: 2020-11-02 — End: 2020-11-07

## 2020-11-02 NOTE — Telephone Encounter (Signed)
Scheduled 4/20

## 2020-11-02 NOTE — Telephone Encounter (Signed)
Requested Prescriptions   Signed Prescriptions Disp Refills   spironolactone (ALDACTONE) 25 MG tablet 15 tablet 0    Sig: Take 0.5 tablets (12.5 mg total) by mouth daily.    Authorizing Provider: Rise Mu    Ordering User: Britt Bottom

## 2020-11-02 NOTE — Addendum Note (Signed)
Addended by: Britt Bottom on: 11/02/2020 03:34 PM   Modules accepted: Orders

## 2020-11-06 NOTE — Progress Notes (Signed)
Office Visit    Patient Name: [name] Date of Encounter: 11/07/2020  PCP:  [name], MD   [facility]  Cardiologist:  [name], MD  Advanced Practice Provider:  No care team member to display Electrophysiologist:  None   Chief Complaint    Ronnie Mcintyre is a 55 y.o. male with a hx of cardiomyopathy due to chemotherapy, obesity, hypertension, hyperlipidemia, large B-cell lymphoma s/p chemo and RCHOP presents today for follow up of cardiomyopathy.   Past Medical History    Past Medical History:  Diagnosis Date  . Cancer ([place]) 12/2017  . Hypertension    Past Surgical History:  Procedure Laterality Date  . ANKLE SURGERY Right   . PORTACATH PLACEMENT Right 02/11/2018   Procedure: INSERTION PORT-A-CATH;  Surgeon: Jules Husbands, MD;  Location: [facility] ORS;  Service: General;  Laterality: Right;    Allergies  No Known Allergies  History of Present Illness    Ronnie Mcintyre is a 55 y.o. male with a hx of cardiomyopathy due to chemotherapy, obesity, hypertension, hyperlipidemia, coronary artery calcification, mildly dilated aortic root, large B-cell lymphoma s/p chemo and RCHOP last seen 03/29/2020 by Dr. Fletcher Anon.  He was diagnosed with large B-cell lymphoma June 2019 and received chemotherapy with RCHOP. Echocardiogram prior to chem 12/2017 LVEF 55-60% with probably severe hypokinesis of mid to distal anteroseptal, anterior, and apical myocardium.  Treadmill myoview 04/2018 suboptimal though small apical defect likely due to apical thinning although small infarct could not be excluded. Overall, low risk study with EF 50%. He had extensive radiation therapy complicated by cough and shortness of breath which improved. Repeat echocardiogram early 2020 LVEF 40-45%. Echocardiogram 10/2019 LVEF 40-45%. He has been maintained on Telmisartan, Aldactone, Carvedilol.  He had COVID 07/2020 which did not require hospitalization.   He presents today for  follow up. Feels overall well. Enjoys walking and playing golf in his spare time. He works for [employer]. Reports no shortness of breath nor dyspnea on exertion. Reports no chest pain, pressure, or tightness. No edema, orthopnea, PND. Reports no palpitations. Home SBP routinely 110s with no lightheadedness, dizziness, near syncope, syncope. Endorses following a heart healthy diet.   EKGs/Labs/Other Studies Reviewed:   The following studies were reviewed today:  EKG:  EKG is  ordered today.  The ekg ordered today demonstrates NSR 86 bpm with LAFB, poor R wave progression in anterior leads. No acute ST/T wave changes.   Recent Labs: 06/19/2020: ALT 31; BUN 15; Creatinine, Ser 1.14; Hemoglobin 16.0; Platelets 189; Potassium 4.1; Sodium 136  Recent Lipid Panel No results found for: CHOL, TRIG, HDL, CHOLHDL, VLDL, LDLCALC, LDLDIRECT  Home Medications   Current Meds  Medication Sig  . acetaminophen (TYLENOL) 325 MG tablet Take 2 tablets (650 mg total) by mouth every 6 (six) hours as needed for mild pain (or Fever >/= 101).  [name] aspirin 81 MG tablet Take 81 mg by mouth daily.   . rosuvastatin (CRESTOR) 40 MG tablet TAKE 1 TABLET BY MOUTH EVERY DAY  . spironolactone (ALDACTONE) 25 MG tablet Take 0.5 tablets (12.5 mg total) by mouth daily.  [name] telmisartan (MICARDIS) 80 MG tablet Take 80 mg by mouth daily.      Review of Systems  All other systems reviewed and are otherwise negative except as noted above.  Physical Exam    VS:  BP 104/80   Pulse 86   Ht 6' (1.829 m)   Wt 267 lb (121.1  kg)   BMI 36.21 kg/m  , BMI Body mass index is 36.21 kg/m.  Wt Readings from Last 3 Encounters:  11/07/20 267 lb (121.1 kg)  06/20/20 270 lb 1 oz (122.5 kg)  04/05/20 262 lb 14.4 oz (119.3 kg)    GEN: Well nourished, overweight, well developed, in no acute distress. HEENT: normal. Neck: Supple, no JVD, carotid bruits, or masses. Cardiac: RRR, no murmurs, rubs, or gallops. No clubbing,  cyanosis, edema.  Radials/DP/PT 2+ and equal bilaterally.  Respiratory:  Respirations regular and unlabored, clear to auscultation bilaterally. GI: Soft, nontender, nondistended. MS: No deformity or atrophy. Skin: Warm and dry, no rash. Neuro:  Strength and sensation are intact. Psych: Normal affect.  Assessment & Plan    1. Cardiomyopathy - Echo 10/2019 LVEF 40-45%. Euvolemic and well compensated. NYHA I. GDMT includes Coreg, Spironolactone, Telmisartan. Plan for echocardiogram to reassess LVEF. If LVEF not improved, consider further escalation of GDMT to include Entresto or SGLT2i. Heart healthy diet and regular cardiovascular exercise encouraged.   2. HTN - BP well controlled. Continue current antihypertensive regimen.   3. HLD - 10/08/20 LDL 64. Continue Crestor 40mg  daily. Refill provided.  4. Coronary artery calcification - No anginal symptoms. No indication for ischemic evaluation. GDMT includes aspirin, beta blocker, statin.   5. Mildly dilated aortic root - 76mmby echo and by CT 11/2019 max measurement 87mm. Update echo, as above.   Disposition: Follow up in 3 month(s) with Dr. Fletcher Anon or APP   Signed, Loel Dubonnet, NP 11/07/2020, 9:18 AM [place]

## 2020-11-07 ENCOUNTER — Encounter: Payer: Self-pay | Admitting: Family

## 2020-11-07 ENCOUNTER — Other Ambulatory Visit: Payer: Self-pay

## 2020-11-07 ENCOUNTER — Ambulatory Visit: Payer: Commercial Managed Care - PPO | Admitting: Family

## 2020-11-07 VITALS — BP 104/80 | HR 86 | Ht 72.0 in | Wt 267.0 lb

## 2020-11-07 DIAGNOSIS — I10 Essential (primary) hypertension: Secondary | ICD-10-CM

## 2020-11-07 DIAGNOSIS — I2584 Coronary atherosclerosis due to calcified coronary lesion: Secondary | ICD-10-CM

## 2020-11-07 DIAGNOSIS — T451X5A Adverse effect of antineoplastic and immunosuppressive drugs, initial encounter: Secondary | ICD-10-CM | POA: Diagnosis not present

## 2020-11-07 DIAGNOSIS — E782 Mixed hyperlipidemia: Secondary | ICD-10-CM

## 2020-11-07 DIAGNOSIS — I427 Cardiomyopathy due to drug and external agent: Secondary | ICD-10-CM | POA: Diagnosis not present

## 2020-11-07 DIAGNOSIS — I2510 Atherosclerotic heart disease of native coronary artery without angina pectoris: Secondary | ICD-10-CM

## 2020-11-07 DIAGNOSIS — T451X5D Adverse effect of antineoplastic and immunosuppressive drugs, subsequent encounter: Secondary | ICD-10-CM

## 2020-11-07 MED ORDER — ROSUVASTATIN CALCIUM 40 MG PO TABS: 40.0000 mg | ORAL_TABLET | Freq: Every day | ORAL | 2 refills | Status: DC

## 2020-11-07 MED ORDER — CARVEDILOL 6.25 MG PO TABS: 6.2500 mg | ORAL_TABLET | Freq: Two times a day (BID) | ORAL | 2 refills | Status: DC

## 2020-11-07 MED ORDER — SPIRONOLACTONE 25 MG PO TABS: 0.5000 | ORAL_TABLET | Freq: Every day | ORAL | 2 refills | Status: DC

## 2020-11-07 NOTE — Patient Instructions (Addendum)
Medication Instructions:  Continue your current medications.   *If you need a refill on your cardiac medications before your next appointment, please call your pharmacy*  Lab Work: None ordered today. Your recent cholesterol panel looked great!  Testing/Procedures: Your EKG today was stable compared to previous.   Your physician has requested that you have an echocardiogram. Echocardiography is a painless test that uses sound waves to create images of your heart. It provides your doctor with information about the size and shape of your heart and how well your heart's chambers and valves are working. This procedure takes approximately one hour. There are no restrictions for this procedure. There is a possibility that an IV may need to be started during your test to inject an image enhancing agent. This is done to obtain more optimal pictures of your heart. Therefore we ask that you do at least drink some water prior to coming in to hydrate your veins.   Follow-Up: At [facility], you and your health needs are our priority.  As part of our continuing mission to provide you with exceptional heart care, we have created designated Provider Care Teams.  These Care Teams include your primary Cardiologist (physician) and Advanced Practice Providers (APPs -  Physician Assistants and Nurse Practitioners) who all work together to provide you with the care you need, when you need it.  We recommend signing up for the patient portal called "MyChart".  Sign up information is provided on this After Visit Summary.  MyChart is used to connect with patients for Virtual Visits (Telemedicine).  Patients are able to view lab/test results, encounter notes, upcoming appointments, etc.  Non-urgent messages can be sent to your provider as well.   To learn more about what you can do with MyChart, go to [email or web address].    Your next appointment:   6 month(s)  The format for your next appointment:   In  Person  Provider:   You may see [name], MD or one of the following Advanced Practice Providers on your designated Care Team:    Murray Hodgkins, NP  Christell Faith, PA-C  Marrianne Mood, PA-C  Cadence Kathlen Mody, [place]  Laurann Montana, NP  Other Instructions  Heart Healthy Diet Recommendations: A low-salt diet is recommended. Meats should be grilled, baked, or boiled. Avoid fried foods. Focus on lean protein sources like fish or chicken with vegetables and fruits. The American Heart Association is a Microbiologist!  American Heart Association Diet and Lifeystyle Recommendations   Exercise recommendations: The American Heart Association recommends 150 minutes of moderate intensity exercise weekly. Try 30 minutes of moderate intensity exercise 4-5 times per week. This could include walking, jogging, or swimming.

## 2020-11-30 ENCOUNTER — Other Ambulatory Visit: Payer: Self-pay

## 2020-11-30 DIAGNOSIS — C8510 Unspecified B-cell lymphoma, unspecified site: Secondary | ICD-10-CM

## 2020-11-30 DIAGNOSIS — Z95828 Presence of other vascular implants and grafts: Secondary | ICD-10-CM

## 2020-12-05 ENCOUNTER — Inpatient Hospital Stay: Payer: Commercial Managed Care - PPO | Admitting: Oncology

## 2020-12-05 ENCOUNTER — Inpatient Hospital Stay: Payer: Commercial Managed Care - PPO | Attending: Oncology

## 2020-12-05 ENCOUNTER — Other Ambulatory Visit: Payer: Self-pay

## 2020-12-05 VITALS — BP 120/90 | HR 94 | Temp 96.7°F | Resp 20 | Wt 266.5 lb

## 2020-12-05 DIAGNOSIS — C8590 Non-Hodgkin lymphoma, unspecified, unspecified site: Secondary | ICD-10-CM | POA: Diagnosis present

## 2020-12-05 DIAGNOSIS — Z452 Encounter for adjustment and management of vascular access device: Secondary | ICD-10-CM | POA: Diagnosis not present

## 2020-12-05 DIAGNOSIS — Z08 Encounter for follow-up examination after completed treatment for malignant neoplasm: Secondary | ICD-10-CM

## 2020-12-05 DIAGNOSIS — Z8579 Personal history of other malignant neoplasms of lymphoid, hematopoietic and related tissues: Secondary | ICD-10-CM

## 2020-12-05 DIAGNOSIS — C8510 Unspecified B-cell lymphoma, unspecified site: Secondary | ICD-10-CM

## 2020-12-05 DIAGNOSIS — Z95828 Presence of other vascular implants and grafts: Secondary | ICD-10-CM

## 2020-12-05 LAB — COMPREHENSIVE METABOLIC PANEL
ALT: 32 U/L (ref 0–44)
AST: 25 U/L (ref 15–41)
Albumin: 3.9 g/dL (ref 3.5–5.0)
Alkaline Phosphatase: 67 U/L (ref 38–126)
Anion gap: 7 (ref 5–15)
BUN: 18 mg/dL (ref 6–20)
CO2: 25 mmol/L (ref 22–32)
Calcium: 9.1 mg/dL (ref 8.9–10.3)
Chloride: 104 mmol/L (ref 98–111)
Creatinine, Ser: 1.18 mg/dL (ref 0.61–1.24)
GFR, Estimated: >60 mL/min (ref >60)
Glucose, Bld: 115 mg/dL — ABNORMAL HIGH (ref 70–99)
Potassium: 4.1 mmol/L (ref 3.5–5.1)
Sodium: 136 mmol/L (ref 135–145)
Total Bilirubin: 1.4 mg/dL — ABNORMAL HIGH (ref 0.3–1.2)
Total Protein: 7.1 g/dL (ref 6.5–8.1)

## 2020-12-05 LAB — CBC WITH DIFFERENTIAL/PLATELET
Abs Immature Granulocytes: 0.03 10*3/uL (ref 0.00–0.07)
Basophils Absolute: 0 10*3/uL (ref 0.0–0.1)
Basophils Relative: 1 %
Eosinophils Absolute: 0.2 10*3/uL (ref 0.0–0.5)
Eosinophils Relative: 2 %
HCT: 49.6 % (ref 39.0–52.0)
Hemoglobin: 16.8 g/dL (ref 13.0–17.0)
Immature Granulocytes: 0 %
Lymphocytes Relative: 11 %
Lymphs Abs: 0.9 10*3/uL (ref 0.7–4.0)
MCH: 28.7 pg (ref 26.0–34.0)
MCHC: 33.9 g/dL (ref 30.0–36.0)
MCV: 84.6 fL (ref 80.0–100.0)
Monocytes Absolute: 0.6 10*3/uL (ref 0.1–1.0)
Monocytes Relative: 7 %
Neutro Abs: 6.6 10*3/uL (ref 1.7–7.7)
Neutrophils Relative %: 79 %
Platelets: 212 10*3/uL (ref 150–400)
RBC: 5.86 MIL/uL — ABNORMAL HIGH (ref 4.22–5.81)
RDW: 14.7 % (ref 11.5–15.5)
WBC: 8.3 10*3/uL (ref 4.0–10.5)
nRBC: 0 % (ref 0.0–0.2)

## 2020-12-05 LAB — URIC ACID: Uric Acid, Serum: 7 mg/dL (ref 3.7–8.6)

## 2020-12-05 LAB — LACTATE DEHYDROGENASE: LDH: 157 U/L (ref 98–192)

## 2020-12-05 MED ORDER — SODIUM CHLORIDE 0.9% FLUSH
10.0000 mL | INTRAVENOUS | Status: DC | PRN
  Administered 2020-12-05: 10 mL via INTRAVENOUS
  Filled 2020-12-05: qty 10

## 2020-12-05 MED ORDER — HEPARIN SOD (PORK) LOCK FLUSH 100 UNIT/ML IV SOLN
500.0000 [IU] | Freq: Once | INTRAVENOUS | Status: AC
  Administered 2020-12-05: 500 [IU] via INTRAVENOUS
  Filled 2020-12-05: qty 5

## 2020-12-05 NOTE — Progress Notes (Signed)
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 [facility] pathologyrevealed necrotic tissue. [facility] consultationrevealed neoplastic cells are positive for CD20, BCL-6, MUM-1, and BCL-2. The neoplastic cells were negative for CD30, CD3, , CD10, c-MYC, EBV ISH, AE1/3, and PAX-8. Ki67 is 60-70%. 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 The absence of CD30 expression makes primary mediastinal large B-cell lymphoma less likely. FISH for MYC rearrangement was negative.IPI score, age adjusted IPI, are low (1) and NCCN IPI scoreis low-intermediate (2).  He presented with upper chest fullness with radiation to his neck and arm. He has had B symptoms (sweats and weight loss) worrisome for lymphoma. LDHis 273. Uric acid was normal. Beta-HCG and AFP were normal on 01/16/2018.  On  Bone marrow biopsyon 02/03/2018 revealed a slightly hypercellular bone marrow for age with trilineage hematopoiesis. There was no monoclonal B-cell population or abnormal T-cell phenotype identified. Cytogenetics were normal (46, XY).  He received 6 cycles of RCHOPchemotherapy (02/16/2018 - 06/10/2018) with Neulasta support. He received radiation(3600 cGy in 18 fractions) from 07/27/2018 - 08/19/2018.  Patient has had  surveillance scans up until November 2021 Which showed small residual MR for soft tissue density in the mediastinum measuring 2.4 cm x 1.2 cm with no significant change over the last 1 year.  Interval history-patient reports doing well and denies any specific complaints at this time.  Appetite and weight have remained stable.  Denies any unintentional weight loss, drenching night sweats or lumps or bumps anywhere  ECOG PS- 0 Pain scale- 0   Review of systems- Review of Systems  Constitutional: Negative for chills, fever, malaise/fatigue and weight loss.  HENT: Negative for congestion, ear discharge and nosebleeds.   Eyes: Negative for blurred vision.  Respiratory: Negative for cough, hemoptysis, sputum production, shortness of breath and wheezing.   Cardiovascular: Negative for chest pain, palpitations, orthopnea and claudication.  Gastrointestinal: Negative for abdominal pain, blood in stool, constipation, diarrhea, heartburn, melena, nausea and vomiting.  Genitourinary: Negative for dysuria, flank pain, frequency, hematuria and urgency.  Musculoskeletal: Negative for back pain, joint pain and myalgias.  Skin: Negative for rash.  Neurological: Negative for dizziness, tingling, focal weakness, seizures, weakness and headaches.  Endo/Heme/Allergies: Does not bruise/bleed easily.  Psychiatric/Behavioral: Negative for depression and suicidal ideas. The patient does not have insomnia.      No Known Allergies   Past Medical History:  Diagnosis Date  . Cancer ([place]) 12/2017  . Hypertension      Past Surgical History:  Procedure Laterality Date  . ANKLE SURGERY Right   . PORTACATH PLACEMENT Right 02/11/2018   Procedure: INSERTION PORT-A-CATH;  Surgeon: Jules Husbands, MD;  Location: [facility] ORS;  Service: General;  Laterality: Right;    Social History   Socioeconomic History  . Marital status: Single    Spouse name: Not on file  . Number of children: Not on  file  . Years of  education: Not on file  . Highest education level: Not on file  Occupational History  . Not on file  Tobacco Use  . Smoking status: Never Smoker  . Smokeless tobacco: Never Used  Vaping Use  . Vaping Use: Never used  Substance and Sexual Activity  . Alcohol use: Yes    Comment: rarely  . Drug use: No  . Sexual activity: Not on file  Other Topics Concern  . Not on file  Social History Narrative  . Not on file   Social Determinants of Health   Financial Resource Strain: Not on file  Food Insecurity: Not on file  Transportation Needs: Not on file  Physical Activity: Not on file  Stress: Not on file  Social Connections: Not on file  Intimate Partner Violence: Not on file    Family History  Problem Relation Age of Onset  . Hypertension Mother   . Hypertension Father   . Multiple myeloma Father      Current Outpatient Medications:  .  acetaminophen (TYLENOL) 325 MG tablet, Take 2 tablets (650 mg total) by mouth every 6 (six) hours as needed for mild pain (or Fever >/= 101)., Disp: , Rfl:  .  aspirin 81 MG tablet, Take 81 mg by mouth daily. , Disp: , Rfl:  .  carvedilol (COREG) 6.25 MG tablet, Take 1 tablet (6.25 mg total) by mouth 2 (two) times daily with a meal., Disp: 180 tablet, Rfl: 2 .  rosuvastatin (CRESTOR) 40 MG tablet, Take 1 tablet (40 mg total) by mouth daily., Disp: 90 tablet, Rfl: 2 .  spironolactone (ALDACTONE) 25 MG tablet, Take 0.5 tablets (12.5 mg total) by mouth daily., Disp: 45 tablet, Rfl: 2 .  telmisartan (MICARDIS) 80 MG tablet, Take 80 mg by mouth daily. , Disp: , Rfl:   Physical exam:  Vitals:   12/05/20 0927  BP: 120/90  Pulse: 94  Resp: 20  Temp: (!) 96.7 F (35.9 C)  SpO2: 98%  Weight: 266 lb 8.6 oz (120.9 kg)   Physical Exam Cardiovascular:     Rate and Rhythm: Normal rate and regular rhythm.     Heart sounds: Normal heart sounds.  Pulmonary:     Effort: Pulmonary effort is normal.     Breath sounds: Normal breath sounds.   Abdominal:     General: Bowel sounds are normal.     Palpations: Abdomen is soft.  Lymphadenopathy:     Comments: No palpable cervical, supraclavicular, axillary or inguinal adenopathy   Skin:    General: Skin is warm and dry.  Neurological:     Mental Status: He is alert and oriented to person, place, and time.      CMP Latest Ref Rng & Units 12/05/2020  Glucose 70 - 99 mg/dL 115(H)  BUN 6 - 20 mg/dL 18  Creatinine 0.61 - 1.24 mg/dL 1.18  Sodium 135 - 145 mmol/L 136  Potassium 3.5 - 5.1 mmol/L 4.1  Chloride 98 - 111 mmol/L 104  CO2 22 - 32 mmol/L 25  Calcium 8.9 - 10.3 mg/dL 9.1  Total Protein 6.5 - 8.1 g/dL 7.1  Total Bilirubin 0.3 - 1.2 mg/dL 1.4(H)  Alkaline Phos 38 - 126 U/L 67  AST 15 - 41 U/L 25  ALT 0 - 44 U/L 32   CBC Latest Ref Rng & Units 12/05/2020  WBC 4.0 - 10.5 K/uL 8.3  Hemoglobin 13.0 - 17.0 g/dL 16.8  Hematocrit 39.0 - 52.0 % 49.6  Platelets 150 - 400 K/uL 212      Assessment and plan- Patient is a 55 y.o. male with history of stage Ib bulky large B-cell lymphoma diagnosed in 2019 s/p 6 cycles of R-CHOP followed by involved field radiation currently in CR 1 here for routine follow-up  Patient has had surveillance imaging done for 2 years after his diagnosis.  He has some residual Morphis soft tissue density in the mediastinum measuring about 2.4 cm which is essentially remained unchanged over the last 1 year.  This does not require any further imaging follow-up.  Clinically patient is doing well with no concerning signs and symptoms of recurrence based on today's exam.  I will see him back in 6 months with CBC with differential CMP and LDH   Visit Diagnosis 1. Encounter for follow-up surveillance of diffuse large B-cell lymphoma   2. Encounter for care related to Port-a-Cath      Dr. Randa Evens, MD, MPH [facility] at [facility] [phone] 12/05/2020 4:08 PM

## 2020-12-07 ENCOUNTER — Other Ambulatory Visit: Payer: Self-pay

## 2020-12-07 ENCOUNTER — Ambulatory Visit (INDEPENDENT_AMBULATORY_CARE_PROVIDER_SITE_OTHER): Payer: Commercial Managed Care - PPO

## 2020-12-07 DIAGNOSIS — I427 Cardiomyopathy due to drug and external agent: Secondary | ICD-10-CM

## 2020-12-07 DIAGNOSIS — I10 Essential (primary) hypertension: Secondary | ICD-10-CM | POA: Diagnosis not present

## 2020-12-07 DIAGNOSIS — T451X5D Adverse effect of antineoplastic and immunosuppressive drugs, subsequent encounter: Secondary | ICD-10-CM | POA: Diagnosis not present

## 2020-12-07 LAB — ECHOCARDIOGRAM COMPLETE
AR max vel: 3.29 cm2
AV Area VTI: 3.18 cm2
AV Area mean vel: 2.81 cm2
AV Mean grad: 2 mmHg
AV Peak grad: 3.4 mmHg
Ao pk vel: 0.92 m/s
Area-P 1/2: 3.66 cm2

## 2020-12-07 MED ORDER — PERFLUTREN LIPID MICROSPHERE
1.0000 mL | INTRAVENOUS | Status: AC | PRN
  Administered 2020-12-07: 2 mL via INTRAVENOUS

## 2020-12-11 ENCOUNTER — Telehealth: Payer: Self-pay | Admitting: *Deleted

## 2020-12-11 DIAGNOSIS — I2510 Atherosclerotic heart disease of native coronary artery without angina pectoris: Secondary | ICD-10-CM

## 2020-12-11 DIAGNOSIS — I5022 Chronic systolic (congestive) heart failure: Secondary | ICD-10-CM

## 2020-12-11 DIAGNOSIS — I427 Cardiomyopathy due to drug and external agent: Secondary | ICD-10-CM

## 2020-12-11 DIAGNOSIS — I2584 Coronary atherosclerosis due to calcified coronary lesion: Secondary | ICD-10-CM

## 2020-12-11 MED ORDER — ENTRESTO 49-51 MG PO TABS
1.0000 | ORAL_TABLET | Freq: Two times a day (BID) | ORAL | 11 refills | Status: DC
Start: 2020-12-11 — End: 2021-12-24

## 2020-12-11 NOTE — Telephone Encounter (Signed)
-----   Message from Loel Dubonnet, NP sent at 12/10/2020  8:05 AM EDT ----- Heart pumping function remains mildly decreased with EF stable at 40-45%. Mild stiff ness of the heart. Mild dilation of ascending aorta, stable compared to previous.  To help improve heart pumping function - recommend stop Telmisartan and start Entresto 49-51mg  BID (please provide coupon) with BMP in 2-3 weeks. Due for follow up 01/2021 with Dr. Fletcher Anon or APP.

## 2020-12-11 NOTE — Telephone Encounter (Signed)
Reviewed results and recommendations with patient. Provided with information to obtain co pay card, reviewed labs needed once starting new medication, and that I will have scheduling call to set up appointment with provider. Also reviewed that I will send him My Chart message with this information as well. He verbalized understanding of our conversation, agreement with plan, and had no further questions at this time.

## 2020-12-12 ENCOUNTER — Other Ambulatory Visit: Payer: Self-pay

## 2020-12-12 ENCOUNTER — Encounter: Payer: Self-pay | Admitting: Surgery

## 2020-12-12 ENCOUNTER — Ambulatory Visit: Payer: Commercial Managed Care - PPO | Admitting: Surgery

## 2020-12-12 ENCOUNTER — Telehealth: Payer: Self-pay | Admitting: *Deleted

## 2020-12-12 VITALS — BP 129/91 | HR 94 | Temp 98.4°F | Ht 72.0 in | Wt 268.0 lb

## 2020-12-12 DIAGNOSIS — C8580 Other specified types of non-Hodgkin lymphoma, unspecified site: Secondary | ICD-10-CM

## 2020-12-12 DIAGNOSIS — Z452 Encounter for adjustment and management of vascular access device: Secondary | ICD-10-CM

## 2020-12-12 NOTE — Patient Instructions (Addendum)
May shower tomorrow, do not rub at the area. Do not submerge area until fully healed.   May use ice pack as needed for comfort. May take Tylenol or Ibuprofen as needed for pain.  The glue will start to come off in 1-2 weeks.    Implanted Port Removal, Care After This sheet gives you information about how to care for yourself after your procedure. Your health care provider may also give you more specific instructions. If you have problems or questions, contact your health care provider. What can I expect after the procedure? After the procedure, it is common to have:  Soreness or pain near your incision.  Some swelling or bruising near your incision. Follow these instructions at home: Medicines  Take over-the-counter and prescription medicines only as told by your health care provider. If you were prescribed an antibiotic medicine, take it as told by your health care provider.    Do not take baths, swim, or use a hot tub until your health care provider approves. Ask your health care provider if you can take showers.  Incision care  Follow instructions from your health care provider about how to take care of your incision. Make sure you: ? Wash your hands with soap and water before you change your bandage (dressing). If soap and water are not available, use hand sanitizer. ? Change your dressing as told by your health care provider. ? Keep your dressing dry. ? Leave stitches (sutures), skin glue, or adhesive strips in place. These skin closures may need to stay in place for 2 weeks or longer. If adhesive strip edges start to loosen and curl up, you may trim the loose edges. Do not remove adhesive strips completely unless your health care provider tells you to do that.  Check your incision area every day for signs of infection. Check for: ? More redness, swelling, or pain. ? More fluid or blood. ? Warmth. ? Pus or a bad smell.    Return to your normal activities as told by your  health care provider. Ask your health care provider what activities are safe for you.  Do not do activities that involve lifting your arms over your head. General instructions  Do not use any products that contain nicotine or tobacco, such as cigarettes and e-cigarettes. These can delay healing. If you need help quitting, ask your health care provider.  Keep all follow-up visits as told by your health care provider. This is important. Contact a health care provider if:  You have more redness, swelling, or pain around your incision.  You have more fluid or blood coming from your incision.  Your incision feels warm to the touch.  You have pus or a bad smell coming from your incision.  You have pain that is not relieved by your pain medicine. Get help right away if you have:  A fever or chills.  Chest pain.  Difficulty breathing. Summary  After the procedure, it is common to have pain, soreness, swelling, or bruising near your incision.  If you were prescribed an antibiotic medicine, take it as told by your health care provider.  Return to your normal activities as told by your health care provider. Ask your health care provider what activities are safe for you. This information is not intended to replace advice given to you by your health care provider. Make sure you discuss any questions you have with your health care provider. Document Revised: 08/20/2017 Document Reviewed: 08/20/2017 Elsevier Patient Education  2021  [employer].

## 2020-12-12 NOTE — Progress Notes (Signed)
Procedure note  Dx: hx lymphoma  Procedure: removal of right IJ port  Anesthesia: lidocaine 1% w epi total 10cc  EBL: minimal  Complications: none   Was placed in a spine position after he was prepped and draped in usual sterile fashion local anesthetic was infiltrated.  15 blade knife was used for the skin incision using Metzenbaum we dissected subcutaneous tissue until we find the port.  We removed the Prolene sutures and were able to free the port.  After asking the patient for a Valsalva maneuver were able to safely remove the port.  Hemostasis was obtained with pressure.  Wound was closed in a 2 layer fashion with 3-0 Vicryl's and 4-0 Monocryl for the skin in a subcuticular fashion.  Dermabond was applied

## 2020-12-12 NOTE — Telephone Encounter (Signed)
Pt has been approved for Entresto 49-51mg  tablet. Pt approved 12/11/2020-12/12/2023.

## 2020-12-12 NOTE — Telephone Encounter (Signed)
L MOM to schedule follow up appointment in July

## 2021-01-22 ENCOUNTER — Encounter (HOSPITAL_BASED_OUTPATIENT_CLINIC_OR_DEPARTMENT_OTHER): Payer: Self-pay | Admitting: Family

## 2021-01-23 MED ORDER — CARVEDILOL 6.25 MG PO TABS: 6.2500 mg | ORAL_TABLET | Freq: Two times a day (BID) | ORAL | 1 refills | Status: DC

## 2021-01-23 MED ORDER — ROSUVASTATIN CALCIUM 40 MG PO TABS: 40.0000 mg | ORAL_TABLET | Freq: Every day | ORAL | 1 refills | Status: DC

## 2021-02-08 ENCOUNTER — Other Ambulatory Visit
Admission: RE | Admit: 2021-02-08 | Discharge: 2021-02-08 | Disposition: A | Discharge status: Home / Self Care | Payer: Commercial Managed Care - PPO | Source: Ambulatory Visit | Attending: Cardiovascular Disease | Admitting: Cardiovascular Disease

## 2021-02-08 ENCOUNTER — Encounter: Payer: Self-pay | Admitting: Cardiovascular Disease

## 2021-02-08 ENCOUNTER — Ambulatory Visit: Payer: Commercial Managed Care - PPO | Admitting: Cardiovascular Disease

## 2021-02-08 ENCOUNTER — Other Ambulatory Visit: Payer: Self-pay

## 2021-02-08 VITALS — BP 128/72 | HR 93 | Ht 72.0 in | Wt 269.5 lb

## 2021-02-08 DIAGNOSIS — E782 Mixed hyperlipidemia: Secondary | ICD-10-CM | POA: Diagnosis not present

## 2021-02-08 DIAGNOSIS — I5022 Chronic systolic (congestive) heart failure: Secondary | ICD-10-CM

## 2021-02-08 DIAGNOSIS — I427 Cardiomyopathy due to drug and external agent: Secondary | ICD-10-CM | POA: Diagnosis not present

## 2021-02-08 DIAGNOSIS — T451X5A Adverse effect of antineoplastic and immunosuppressive drugs, initial encounter: Secondary | ICD-10-CM

## 2021-02-08 DIAGNOSIS — I10 Essential (primary) hypertension: Secondary | ICD-10-CM | POA: Diagnosis not present

## 2021-02-08 DIAGNOSIS — T451X5D Adverse effect of antineoplastic and immunosuppressive drugs, subsequent encounter: Secondary | ICD-10-CM

## 2021-02-08 DIAGNOSIS — I77810 Thoracic aortic ectasia: Secondary | ICD-10-CM

## 2021-02-08 LAB — BASIC METABOLIC PANEL
Anion gap: 7 (ref 5–15)
BUN: 14 mg/dL (ref 6–20)
CO2: 24 mmol/L (ref 22–32)
Calcium: 8.8 mg/dL — ABNORMAL LOW (ref 8.9–10.3)
Chloride: 104 mmol/L (ref 98–111)
Creatinine, Ser: 1.01 mg/dL (ref 0.61–1.24)
GFR, Estimated: >60 mL/min (ref >60)
Glucose, Bld: 96 mg/dL (ref 70–99)
Potassium: 4 mmol/L (ref 3.5–5.1)
Sodium: 135 mmol/L (ref 135–145)

## 2021-02-08 NOTE — Patient Instructions (Signed)
Medication Instructions:  Your physician recommends that you continue on your current medications as directed. Please refer to the Current Medication list given to you today.  *If you need a refill on your cardiac medications before your next appointment, please call your pharmacy*   Lab Work: BMP to be drawn  - [facility] go to the [facility]. You will check in at the front desk to the right as you walk into the atrium. Valet Parking is offered if needed. - No appointment needed. You may go any day between 7 am and 6 pm.    Testing/Procedures: None ordered   Follow-Up: At [facility], you and your health needs are our priority.  As part of our continuing mission to provide you with exceptional heart care, we have created designated Provider Care Teams.  These Care Teams include your primary Cardiologist (physician) and Advanced Practice Providers (APPs -  Physician Assistants and Nurse Practitioners) who all work together to provide you with the care you need, when you need it.  We recommend signing up for the patient portal called "MyChart".  Sign up information is provided on this After Visit Summary.  MyChart is used to connect with patients for Virtual Visits (Telemedicine).  Patients are able to view lab/test results, encounter notes, upcoming appointments, etc.  Non-urgent messages can be sent to your provider as well.   To learn more about what you can do with MyChart, go to [email or web address].    Your next appointment:   6 month(s)  The format for your next appointment:   In Person  Provider:   You may see [name], MD or one of the following Advanced Practice Providers on your designated Care Team:   Murray Hodgkins, NP Christell Faith, PA-C Marrianne Mood, PA-C Cadence Kathlen Mody, [place]   Other Instructions

## 2021-02-08 NOTE — Progress Notes (Signed)
Cardiology Office Note   Date:  02/08/2021   ID:  PIO EATHERLY, DOB September 27, 1965, MRN [id number]  PCP:  [name], MD  Cardiologist:   [name], MD   Chief Complaint  Patient presents with   Other    LS 10/2020 f/u echo c/o elevated BP this week. Meds reviewed verbally with pt.       History of Present Illness: Ronnie Mcintyre is a 55 y.o. 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 The patient has known history of obesity, hypertension and hyperlipidemia.  He has no prior cardiac history.  He is not a smoker or diabetic.  He has no family history of coronary artery disease. 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  He had an echocardiogram done in June 2019 before chemotherapy which showed an EF of 55 to 60% with no wall motion abnormalities and mildly dilated aortic root at 40 mmHg.  He had a repeat echocardiogram after third cycle of chemotherapy which showed an EF of 50 to 55% with probable severe hypokinesis of the mid to distal anteroseptal, anterior and apical myocardium.   Treadmill Myoview in October of 2019 was suboptimal overall but there was a small apical defect likely due to to apical thinning although a small infarct could not be excluded.  Nonetheless, it was a low risk study with EF of 50%. He had extensive radiation therapy that was complicated by cough and shortness of breath that gradually improved.  He had repeated echocardiograms and his ejection fraction has been stable at 40 to 45% most recently in May of this year.  Telmisartan was switched to Entresto.  He has been doing well overall with no chest pain, shortness of breath or palpitations.  He does have history of chronic intermittent cough since his cancer diagnosis and he reports that it slightly worse since he was started on Entresto.   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     aspirin 81 MG tablet Take 81 mg by mouth daily.      carvedilol (COREG) 6.25 MG tablet Take 1 tablet (6.25 mg total) by mouth 2 (two) times daily with a meal. 180 tablet 1   rosuvastatin (CRESTOR) 40 MG tablet Take 1 tablet (40 mg total) by mouth daily. 90 tablet 1   sacubitril-valsartan (ENTRESTO) 49-51 MG Take 1 tablet by mouth 2 (two) times daily. 60 tablet 11   spironolactone (ALDACTONE) 25 MG tablet Take 0.5 tablets (12.5 mg total) by mouth daily. 45 tablet 2   No current facility-administered medications for this visit.    Allergies:   Patient has no known allergies.    Social History:  The patient  reports that he has never smoked. He has never used smokeless tobacco. He reports current alcohol use. He reports that he does not use drugs.   Family History:  The patient's family history includes Hypertension in his father and mother; Multiple myeloma in his father.    ROS:  Please see the history of present illness.   Otherwise, review of systems are positive for none.   All other systems are reviewed and negative.    PHYSICAL EXAM: VS:  BP 128/72 (BP Location: Left Arm, Patient Position: Sitting, Cuff Size: Normal)   Pulse 93   Ht 6' (1.829 m)   Wt 269 lb 8 oz (122.2 kg)   SpO2 98%   BMI 36.55 kg/m  , BMI Body mass index is 36.55 kg/m. GEN: Well nourished, well developed, in no acute distress  HEENT: normal  Neck: no JVD, carotid bruits, or masses Cardiac: RRR; no murmurs, rubs, or  gallops,no edema  Respiratory:  clear to auscultation bilaterally, normal work of breathing GI: soft, nontender, nondistended, + BS MS: no deformity or atrophy  Skin: warm and dry, no rash Neuro:  Strength and sensation are intact Psych: euthymic mood, full affect   EKG:  EKG is not ordered today.   Recent Labs: 12/05/2020: ALT 32; BUN 18; Creatinine, Ser 1.18; Hemoglobin 16.8; Platelets 212; Potassium 4.1; Sodium 136    Lipid Panel No results found for: CHOL, TRIG, HDL, CHOLHDL, VLDL, LDLCALC, LDLDIRECT    Wt Readings from Last 3 Encounters:  02/08/21 269 lb 8 oz (122.2 kg)  12/12/20 268 lb (121.6 kg)  12/05/20 266 lb 8.6 oz (120.9 kg)      PAD Screen 04/22/2018  Previous PAD dx? No  Previous surgical procedure? No  Pain with walking? No  Feet/toe relief with dangling? No  Painful, non-healing ulcers? No  Extremities discolored? No      ASSESSMENT AND PLAN:  1.  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  No evidence of volume overload. The patient is tolerating Entresto but does report slight worsening of his chronic cough.  Will monitor for now.  Continue same medications including carvedilol, spironolactone and Entresto.  I requested basic metabolic profile today given the recent switch to [facility].     2.  Essential hypertension: Blood pressure is well controlled.  He reports some elevated readings at home.  We could consider increasing carvedilol or Entresto upon follow-up if needed.  3.  Hyperlipidemia: I reviewed most recent lipid profile done in March which showed an LDL of 64 and triglyceride of 134.  He does have known history of coronary artery calcifications and I agree with continuing rosuvastatin.  4.  Coronary artery calcifications: Currently with no anginal symptoms.  5.  Mildly dilated ascending aorta at 42 mm.  Recommend repeat imaging  by either CT  or echocardiogram in 1 year.   Disposition:   FU with me in 6 months.  Signed,  [name], MD  02/08/2021 2:02 PM    [place]

## 2021-03-25 ENCOUNTER — Ambulatory Visit (INDEPENDENT_AMBULATORY_CARE_PROVIDER_SITE_OTHER): Payer: Commercial Managed Care - PPO

## 2021-03-25 ENCOUNTER — Other Ambulatory Visit: Payer: Self-pay

## 2021-03-25 ENCOUNTER — Ambulatory Visit
Admission: EM | Admit: 2021-03-25 | Discharge: 2021-03-25 | Disposition: A | Discharge status: Home / Self Care | Payer: Commercial Managed Care - PPO | Attending: Emergency Medicine | Admitting: Emergency Medicine

## 2021-03-25 DIAGNOSIS — R059 Cough, unspecified: Secondary | ICD-10-CM | POA: Diagnosis not present

## 2021-03-25 DIAGNOSIS — J069 Acute upper respiratory infection, unspecified: Secondary | ICD-10-CM | POA: Diagnosis not present

## 2021-03-25 MED ORDER — BENZONATATE 100 MG PO CAPS: 200.0000 mg | ORAL_CAPSULE | Freq: Three times a day (TID) | ORAL | 0 refills | Status: DC

## 2021-03-25 MED ORDER — PROMETHAZINE-DM 6.25-15 MG/5ML PO SYRP: 5.0000 mL | ORAL_SOLUTION | Freq: Four times a day (QID) | ORAL | 0 refills | Status: DC | PRN

## 2021-03-25 MED ORDER — IPRATROPIUM BROMIDE 0.06 % NA SOLN: 2.0000 | Freq: Four times a day (QID) | NASAL | 12 refills | Status: DC

## 2021-03-25 NOTE — ED Triage Notes (Signed)
Patient states that he has been coughing x 6-7 days. States that he has noticed productivity with this. Reports that his symptoms improved on Saturday and worsened again last night.

## 2021-03-25 NOTE — Discharge Instructions (Addendum)

## 2021-03-25 NOTE — ED Provider Notes (Signed)
[facility]    CSN: [id number] Arrival date & time: 03/25/21  0813      History   Chief Complaint Chief Complaint  Patient presents with   Cough    HPI Ronnie Mcintyre is a 55 y.o. male.   HPI  55 year old male here for evaluation of productive cough.  Patient reports that he has had a productive cough for the last 6 to 7 days.  The production got worse in the middle of last week and then improved on Saturday, which was 2 days ago, and then worsened again last night.  He states the cough is worse when he is laying down and he has some shortness of breath and wheezing when laying only.  He describes his sputum as being green in color.  Last night he also developed ear pain, runny nose for green mucus, and a headache.  He denies any fever, sore throat, or sinus pressure.  No GI symptoms.  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   Encounter for antineoplastic immunotherapy 04/11/2018   Encounter for antineoplastic chemotherapy 03/09/2018   Hypokalemia 03/09/2018   Chest discomfort 02/15/2018   Lymphoma malignant, 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[place])    Renal cyst, right 01/22/2018   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   Tubular adenoma of colon 11/19/2017   Asymptomatic proteinuria 11/16/2014   Elevated fasting glucose 11/16/2014   Hyperlipidemia 11/16/2014   Hypertension, essential 11/16/2014   Seasonal allergic rhinitis 11/16/2014   Impaired glucose tolerance 11/16/2014    Past Surgical History:  Procedure Laterality Date   ANKLE SURGERY Right    PORTACATH PLACEMENT Right 02/11/2018   Procedure: INSERTION  PORT-A-CATH;  Surgeon: Jules Husbands, MD;  Location: [facility] ORS;  Service: General;  Laterality: Right;       Home Medications    Prior to Admission medications   Medication Sig Start Date End Date Taking? Authorizing Provider  acetaminophen (TYLENOL) 325 MG tablet Take 2 tablets (650 mg total) by mouth every 6 (six) hours as needed for mild pain (or Fever >/= 101). 01/16/18  Yes Dustin Flock, MD  aspirin 81 MG tablet Take 81 mg by mouth daily.    Yes [provider]  benzonatate (TESSALON) 100 MG capsule Take 2 capsules (200 mg total) by mouth every 8 (eight) hours. 03/25/21  Yes Margarette Canada, NP  carvedilol (COREG) 6.25 MG tablet Take 1 tablet (6.25 mg total) by mouth 2 (two) times daily with a meal. 01/23/21 07/22/21 Yes Loel Dubonnet, NP  ipratropium (ATROVENT) 0.06 % nasal spray Place 2 sprays into both nostrils 4 (four) times daily. 03/25/21  Yes Margarette Canada, NP  promethazine-dextromethorphan (PROMETHAZINE-DM) 6.25-15 MG/5ML syrup Take 5 mLs by mouth 4 (four) times daily as needed. 03/25/21  Yes Margarette Canada, NP  rosuvastatin (CRESTOR) 40 MG tablet Take 1 tablet (40 mg total) by mouth daily. 01/23/21  Yes Loel Dubonnet, NP  sacubitril-valsartan (ENTRESTO) 49-51 MG Take 1 tablet by mouth 2 (two) times daily. 12/11/20  Yes Loel Dubonnet, NP  spironolactone (ALDACTONE) 25 MG tablet Take 0.5 tablets (12.5 mg total) by mouth daily. 11/07/20  Yes Gilford Rile,  Martie Lee, NP    Family History Family History  Problem Relation Age of Onset   Hypertension Mother    Hypertension Father    Multiple myeloma Father     Social History Social History   Tobacco Use   Smoking status: Never   Smokeless tobacco: Never  Vaping Use   Vaping Use: Never used  Substance Use Topics   Alcohol use: Yes    Comment: rarely   Drug use: No     Allergies   Patient has no known allergies.   Review of Systems Review of Systems  Constitutional:  Negative for activity change, appetite change  and fever.  HENT:  Positive for congestion, ear pain and rhinorrhea. Negative for sinus pressure, sinus pain and sore throat.   Respiratory:  Positive for cough, shortness of breath and wheezing.   Gastrointestinal:  Negative for diarrhea, nausea and vomiting.  Musculoskeletal:  Negative for arthralgias and myalgias.  Skin:  Negative for rash.  Neurological:  Positive for headaches.  Hematological: Negative.   Psychiatric/Behavioral: Negative.      Physical Exam Triage Vital Signs ED Triage Vitals  Enc Vitals Group     BP 03/25/21 0827 118/79     Pulse Rate 03/25/21 0827 (!) 102     Resp 03/25/21 0827 17     Temp 03/25/21 0827 98.7 F (37.1 C)     Temp Source 03/25/21 0827 Oral     SpO2 03/25/21 0827 96 %     Weight 03/25/21 0826 245 lb (111.1 kg)     Height 03/25/21 0826 6' (1.829 m)     Head Circumference --      Peak Flow --      Pain Score 03/25/21 0826 7     Pain Loc --      Pain Edu? --      Excl. in [place]? --    No data found.  Updated Vital Signs BP 118/79 (BP Location: Right Arm)   Pulse (!) 102   Temp 98.7 F (37.1 C) (Oral)   Resp 17   Ht 6' (1.829 m)   Wt 245 lb (111.1 kg)   SpO2 96%   BMI 33.23 kg/m   Visual Acuity Right Eye Distance:   Left Eye Distance:   Bilateral Distance:    Right Eye Near:   Left Eye Near:    Bilateral Near:     Physical Exam Vitals and nursing note reviewed.  Constitutional:      General: He is not in acute distress.    Appearance: Normal appearance. He is obese. He is not ill-appearing.  HENT:     Head: Normocephalic and atraumatic.     Right Ear: Tympanic membrane, ear canal and external ear normal. There is no impacted cerumen.     Left Ear: Tympanic membrane, ear canal and external ear normal. There is no impacted cerumen.     Nose: Congestion and rhinorrhea present.     Mouth/Throat:     Mouth: Mucous membranes are moist.     Pharynx: Oropharynx is clear. No posterior oropharyngeal erythema.  Cardiovascular:      Rate and Rhythm: Normal rate and regular rhythm.     Pulses: Normal pulses.     Heart sounds: Normal heart sounds. No murmur heard.   No gallop.  Pulmonary:     Effort: Pulmonary effort is normal.     Breath sounds: Normal breath sounds. No wheezing, rhonchi or rales.  Musculoskeletal:  Cervical back: Normal range of motion and neck supple.  Lymphadenopathy:     Cervical: No cervical adenopathy.  Skin:    General: Skin is warm and dry.     Capillary Refill: Capillary refill takes less than 2 seconds.     Findings: No erythema or rash.  Neurological:     General: No focal deficit present.     Mental Status: He is alert and oriented to person, place, and time.  Psychiatric:        Mood and Affect: Mood normal.        Behavior: Behavior normal.        Thought Content: Thought content normal.        Judgment: Judgment normal.     UC Treatments / Results  Labs (all labs ordered are listed, but only abnormal results are displayed) Labs Reviewed - No data to display  EKG   Radiology DG Chest 2 View  Result Date: 03/25/2021 CLINICAL DATA:  Productive cough for 6-7 days EXAM: CHEST - 2 VIEW COMPARISON:  06/19/2020 chest CT FINDINGS: Normal heart size and mediastinal contours. No acute infiltrate or edema. No effusion or pneumothorax. No acute osseous findings. Spondylosis. IMPRESSION: No active cardiopulmonary disease. Electronically Signed   By: Monte Fantasia M.D.   On: 03/25/2021 09:49    Procedures Procedures (including critical care time)  Medications Ordered in UC Medications - No data to display  Initial Impression / Assessment and Plan / UC Course  I have reviewed the triage vital signs and the nursing notes.  Pertinent labs & imaging results that were available during my care of the patient were reviewed by me and considered in my medical decision making (see chart for details).  Patient is a pleasant, nontoxic appearing 55 year old male here for evaluation  productive cough for the last 6 to 7 days.  Patient does have a history of non-Hodgkin's lymphoma and states that he has been experiencing a cough that is chronic in nature since receiving chemo and radiation.  His last treatments were 2 years ago.  He states that this cough is worse than his typical and is productive for green sputum.  Patient is in no acute distress and he can speak in full sentences.  There is no audible wheezing or dyspnea or tachypnea noted on exam.  Patient's physical exam reveals pearly gray tympanic membranes bilaterally with a normal light reflex and clear external auditory canals.  Nasal mucosa is erythematous and edematous with clear nasal discharge.  There is no sinus tenderness to percussion.  Oropharyngeal exam reveals pink and moist oral mucosal and posterior oropharyngeal tissue without erythema, edema, or exudate.  No injection noted.  No cervical lymphadenopathy appreciated exam.  Cardiopulmonary exam reveals clear but decreased lung sounds in all fields.  Due to the patient's history of non-Hodgkin's lymphoma and productive cough will obtain chest x-ray to look for acute intrathoracic process.  Chest x-ray independently reviewed and evaluated by me.  Impression: There is a questionable developing infiltrate in the right upper lobe but the remainder the lung fields appear clear.  Awaiting radiology overread. Radiology interpretation of chest x-ray is negative for acute intrathoracic process.  Will discharge patient home with a diagnosis of viral URI with cough and treat appropriately with Atrovent nasal spray, Tessalon Perles, and Promethazine DM cough syrup.  Final Clinical Impressions(s) / UC Diagnoses   Final diagnoses:  Viral URI with cough     Discharge Instructions      Use the  Atrovent nasal spray, 2 squirts in each nostril every 6 hours, as needed for runny nose and postnasal drip.  Use the Tessalon Perles every 8 hours during the day.  Take them with a  small sip of water.  They may give you some numbness to the base of your tongue or a metallic taste in your mouth, this is normal.  Use the Promethazine DM cough syrup at bedtime for cough and congestion.  It will make you drowsy so do not take it during the day.  Return for reevaluation or see your primary care provider for any new or worsening symptoms.      ED Prescriptions     Medication Sig Dispense Auth. Provider   ipratropium (ATROVENT) 0.06 % nasal spray Place 2 sprays into both nostrils 4 (four) times daily. 15 mL Margarette Canada, NP   benzonatate (TESSALON) 100 MG capsule Take 2 capsules (200 mg total) by mouth every 8 (eight) hours. 21 capsule Margarette Canada, NP   promethazine-dextromethorphan (PROMETHAZINE-DM) 6.25-15 MG/5ML syrup Take 5 mLs by mouth 4 (four) times daily as needed. 118 mL Margarette Canada, NP      PDMP not reviewed this encounter.   Margarette Canada, NP 03/25/21 [phone]

## 2021-06-08 ENCOUNTER — Other Ambulatory Visit: Payer: Self-pay

## 2021-06-08 ENCOUNTER — Encounter: Payer: Self-pay | Admitting: Emergency Medicine

## 2021-06-08 DIAGNOSIS — M79602 Pain in left arm: Secondary | ICD-10-CM | POA: Insufficient documentation

## 2021-06-08 DIAGNOSIS — Z7982 Long term (current) use of aspirin: Secondary | ICD-10-CM | POA: Diagnosis not present

## 2021-06-08 DIAGNOSIS — Z8572 Personal history of non-Hodgkin lymphomas: Secondary | ICD-10-CM | POA: Diagnosis not present

## 2021-06-08 DIAGNOSIS — F419 Anxiety disorder, unspecified: Secondary | ICD-10-CM | POA: Insufficient documentation

## 2021-06-08 DIAGNOSIS — R0789 Other chest pain: Secondary | ICD-10-CM | POA: Diagnosis present

## 2021-06-08 DIAGNOSIS — I10 Essential (primary) hypertension: Secondary | ICD-10-CM | POA: Insufficient documentation

## 2021-06-08 DIAGNOSIS — Z79899 Other long term (current) drug therapy: Secondary | ICD-10-CM | POA: Diagnosis not present

## 2021-06-08 LAB — CBC
HCT: 50.2 % (ref 39.0–52.0)
Hemoglobin: 17.3 g/dL — ABNORMAL HIGH (ref 13.0–17.0)
MCH: 30 pg (ref 26.0–34.0)
MCHC: 34.5 g/dL (ref 30.0–36.0)
MCV: 87.2 fL (ref 80.0–100.0)
Platelets: 204 10*3/uL (ref 150–400)
RBC: 5.76 MIL/uL (ref 4.22–5.81)
RDW: 14.7 % (ref 11.5–15.5)
WBC: 10.4 10*3/uL (ref 4.0–10.5)
nRBC: 0 % (ref 0.0–0.2)

## 2021-06-08 NOTE — ED Triage Notes (Signed)
Pt to ED via POV, states noted over the last few days felt fatigue, states checked his BP at home and his BP over the last few days was 130, states checked it earlier today and BP 150/106. Pt states hx of HTN.   Pt states after checking his BP today noted some left arm tingling, denies CP, denies SOB at this time. Pt A&O x4, NAD noted at this time.

## 2021-06-09 ENCOUNTER — Emergency Department
Admission: EM | Admit: 2021-06-09 | Discharge: 2021-06-09 | Disposition: A | Discharge status: Home / Self Care | Payer: Commercial Managed Care - PPO | Attending: Emergency Medicine | Admitting: Emergency Medicine

## 2021-06-09 DIAGNOSIS — I10 Essential (primary) hypertension: Secondary | ICD-10-CM

## 2021-06-09 LAB — BASIC METABOLIC PANEL
Anion gap: 9 (ref 5–15)
BUN: 21 mg/dL — ABNORMAL HIGH (ref 6–20)
CO2: 24 mmol/L (ref 22–32)
Calcium: 9.2 mg/dL (ref 8.9–10.3)
Chloride: 101 mmol/L (ref 98–111)
Creatinine, Ser: 1.3 mg/dL — ABNORMAL HIGH (ref 0.61–1.24)
GFR, Estimated: >60 mL/min (ref >60)
Glucose, Bld: 115 mg/dL — ABNORMAL HIGH (ref 70–99)
Potassium: 4 mmol/L (ref 3.5–5.1)
Sodium: 134 mmol/L — ABNORMAL LOW (ref 135–145)

## 2021-06-09 LAB — TROPONIN I (HIGH SENSITIVITY): Troponin I (High Sensitivity): 8 ng/L (ref <18)

## 2021-06-09 NOTE — ED Provider Notes (Signed)
[facility] Emergency Department Provider Note  Time seen: 1:49 AM  I have reviewed the triage vital signs and the nursing notes.   HISTORY  Chief Complaint Hypertension   HPI Ronnie Mcintyre is a 55 y.o. male with a past medical history of non-Hodgkin's lymphoma in remission, hypertension, presents to the emergency department for high blood pressure.  According to the patient he was feeling somewhat "sluggish" today so he checked his blood pressure this evening.  He states normally it is 120/80 however when he checked it it was 130 or so over 90.  Patient states this worried him so he waited approximate hour or so and rechecked it and it was 155/105.  Patient states this concerned him so he came to the emergency department for evaluation.  Patient states he takes medication for high blood pressure, states he is not sure if the medication was not working or if it was due to his "anxiety."  Here the patient does appear mildly anxious, states he was experiencing some chest tightness and left arm tightness when he saw that his blood pressure was that high but again he is not sure if this was due to anxiety.  No symptoms currently.  No chest pain currently.  No shortness of breath nausea or diaphoresis.   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   Encounter for antineoplastic immunotherapy 04/11/2018   Encounter for antineoplastic chemotherapy 03/09/2018   Hypokalemia 03/09/2018   Chest discomfort 02/15/2018   Lymphoma malignant, 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[place])    Renal cyst, right 01/22/2018   Large B-cell lymphoma  ([place]) 01/15/2018   Tubular adenoma of colon 11/19/2017   Asymptomatic proteinuria 11/16/2014   Elevated fasting glucose 11/16/2014   Hyperlipidemia 11/16/2014   Hypertension, essential 11/16/2014   Seasonal allergic rhinitis 11/16/2014   Impaired glucose tolerance 11/16/2014    Past Surgical History:  Procedure Laterality Date   ANKLE SURGERY Right    PORTACATH PLACEMENT Right 02/11/2018   Procedure: INSERTION PORT-A-CATH;  Surgeon: Jules Husbands, MD;  Location: [facility] ORS;  Service: General;  Laterality: Right;    Prior to Admission medications   Medication Sig Start Date End Date Taking? Authorizing Provider  acetaminophen (TYLENOL) 325 MG tablet Take 2 tablets (650 mg total) by mouth every 6 (six) hours as needed for mild pain (or Fever >/= 101). 01/16/18   Dustin Flock, MD  aspirin 81 MG tablet Take 81 mg by mouth daily.     [provider]  benzonatate (TESSALON) 100 MG capsule Take 2 capsules (200 mg total) by mouth every 8 (eight) hours. 03/25/21   Margarette Canada, NP  carvedilol (COREG) 6.25 MG tablet Take 1 tablet (6.25 mg total) by mouth 2 (two) times daily with a meal. 01/23/21 07/22/21  Loel Dubonnet, NP  ipratropium (ATROVENT) 0.06 % nasal spray Place 2 sprays into both nostrils 4 (four) times daily. 03/25/21   Margarette Canada, NP  promethazine-dextromethorphan (PROMETHAZINE-DM) 6.25-15 MG/5ML syrup Take 5 mLs by mouth 4 (four) times daily as needed. 03/25/21   Margarette Canada, NP  rosuvastatin (CRESTOR) 40 MG tablet Take 1 tablet (40 mg total)  by mouth daily. 01/23/21   Loel Dubonnet, NP  sacubitril-valsartan (ENTRESTO) 49-51 MG Take 1 tablet by mouth 2 (two) times daily. 12/11/20   Loel Dubonnet, NP  spironolactone (ALDACTONE) 25 MG tablet Take 0.5 tablets (12.5 mg total) by mouth daily. 11/07/20   Loel Dubonnet, NP    No Known Allergies  Family History  Problem Relation Age of Onset   Hypertension Mother    Hypertension Father    Multiple myeloma Father      Social History Social History   Tobacco Use   Smoking status: Never   Smokeless tobacco: Never  Vaping Use   Vaping Use: Never used  Substance Use Topics   Alcohol use: Yes    Comment: rarely   Drug use: No    Review of Systems Constitutional: Negative for fever. Cardiovascular: Positive for chest tightness earlier today, now resolved Respiratory: Negative for shortness of breath. Gastrointestinal: Negative for abdominal pain, vomiting  Musculoskeletal: Negative for leg pain or swelling. Skin: Negative for skin complaints  Neurological: Negative for headache All other ROS negative  ____________________________________________   PHYSICAL EXAM:  VITAL SIGNS: ED Triage Vitals  Enc Vitals Group     BP 06/08/21 2318 (!) 155/104     Pulse Rate 06/08/21 2318 99     Resp 06/08/21 2318 20     Temp 06/08/21 2318 98.1 F (36.7 C)     Temp src --      SpO2 06/08/21 2318 95 %     Weight 06/08/21 2317 270 lb (122.5 kg)     Height 06/08/21 2317 6' (1.829 m)     Head Circumference --      Peak Flow --      Pain Score 06/08/21 2317 0     Pain Loc --      Pain Edu? --      Excl. in [place]? --    Constitutional: Alert and oriented. Well appearing and in no distress. Eyes: Normal exam ENT      Head: Normocephalic and atraumatic.      Mouth/Throat: Mucous membranes are moist. Cardiovascular: Normal rate, regular rhythm. Respiratory: Normal respiratory effort without tachypnea nor retractions. Breath sounds are clear  Gastrointestinal: Soft and nontender. No distention.  Musculoskeletal: Nontender with normal range of motion in all extremities. No lower extremity tenderness or edema. Neurologic:  Normal speech and language. No gross focal neurologic deficits Skin:  Skin is warm, dry and intact.  Psychiatric: Mood and affect are normal.   ____________________________________________    EKG  EKG shows sinus tachycardia 104 bpm with left axis deviation, narrow QRS.  Patient  does have occasional PVCs.  No concerning ST changes.  ____________________________________________   INITIAL IMPRESSION / ASSESSMENT AND PLAN / ED COURSE  Pertinent labs & imaging results that were available during my care of the patient were reviewed by me and considered in my medical decision making (see chart for details).   Patient presents to the emergency department for high blood pressure and reported chest tightness.  Overall the patient appears well, no distress.  Patient admits that he believes it could be his anxiety that causes blood pressure to go up.  States he was having some chest tightness at that time that is now resolved.  Overall the patient appears well, reassuring physical exam.  Lab work overall reassuring as well slight elevation in creatinine compared to baseline discussed increasing oral hydration at home.  Troponin reassuringly negative.  EKG does show several  ectopic beats.  I recommended that the patient follow-up with his cardiologist which she is already established to discuss possible Holter monitor as well as ongoing blood pressure management.  I did discuss with the patient checking his blood pressure periodically at home and keeping a journal to bring to his physician.  Given the patient's reassuring work-up I believe the patient is safe for discharge home with outpatient follow-up.  Ronnie Mcintyre was evaluated in Emergency Department on 06/09/2021 for the symptoms described in the history of present illness. He was evaluated in the context of the global COVID-19 pandemic, which necessitated consideration that the patient might be at risk for infection with the SARS-CoV-2 virus that causes COVID-19. Institutional protocols and algorithms that pertain to the evaluation of patients at risk for COVID-19 are in a state of rapid change based on information released by regulatory bodies including the CDC and federal and state organizations. These policies and algorithms  were followed during the patient's care in the ED.  ____________________________________________   FINAL CLINICAL IMPRESSION(S) / ED DIAGNOSES  Hypertension   Harvest Dark, MD 06/09/21 [phone]

## 2021-06-09 NOTE — Discharge Instructions (Signed)
Please check your blood pressure every day or every other day for the next 2 weeks and keep this written in a journal.  Please bring this journal to your doctor in the next 2 weeks to discuss your blood pressure regimen and any possible changes that may or may not be needed.  Also has been discussed please call your cardiologist tomorrow to arrange a follow-up appointment to discuss possible Holter monitor given several extra beats on your EKG.  Return to the emergency department for any further chest pain, trouble breathing, or any other symptom personally concerning to yourself.

## 2021-06-10 ENCOUNTER — Encounter: Payer: Self-pay | Admitting: Cardiovascular Disease

## 2021-06-12 ENCOUNTER — Ambulatory Visit: Payer: Commercial Managed Care - PPO | Admitting: Oncology

## 2021-06-12 ENCOUNTER — Other Ambulatory Visit: Payer: Commercial Managed Care - PPO

## 2021-06-12 MED ORDER — CARVEDILOL 12.5 MG PO TABS: 12.5000 mg | ORAL_TABLET | Freq: Two times a day (BID) | ORAL | 0 refills | Status: DC

## 2021-06-17 ENCOUNTER — Inpatient Hospital Stay: Payer: Commercial Managed Care - PPO | Attending: Oncology

## 2021-06-17 ENCOUNTER — Other Ambulatory Visit: Payer: Self-pay

## 2021-06-17 ENCOUNTER — Inpatient Hospital Stay (HOSPITAL_BASED_OUTPATIENT_CLINIC_OR_DEPARTMENT_OTHER): Payer: Commercial Managed Care - PPO | Admitting: Oncology

## 2021-06-17 ENCOUNTER — Encounter: Payer: Self-pay | Admitting: Oncology

## 2021-06-17 ENCOUNTER — Other Ambulatory Visit: Payer: Self-pay | Admitting: *Deleted

## 2021-06-17 VITALS — BP 115/71 | HR 74 | Temp 98.5°F | Ht 72.0 in | Wt 268.0 lb

## 2021-06-17 DIAGNOSIS — Z08 Encounter for follow-up examination after completed treatment for malignant neoplasm: Secondary | ICD-10-CM

## 2021-06-17 DIAGNOSIS — C8330 Diffuse large B-cell lymphoma, unspecified site: Secondary | ICD-10-CM | POA: Diagnosis present

## 2021-06-17 DIAGNOSIS — Z923 Personal history of irradiation: Secondary | ICD-10-CM | POA: Diagnosis not present

## 2021-06-17 DIAGNOSIS — C8510 Unspecified B-cell lymphoma, unspecified site: Secondary | ICD-10-CM

## 2021-06-17 DIAGNOSIS — Z8579 Personal history of other malignant neoplasms of lymphoid, hematopoietic and related tissues: Secondary | ICD-10-CM | POA: Diagnosis not present

## 2021-06-17 LAB — CBC WITH DIFFERENTIAL/PLATELET
Abs Immature Granulocytes: 0.02 10*3/uL (ref 0.00–0.07)
Basophils Absolute: 0.1 10*3/uL (ref 0.0–0.1)
Basophils Relative: 1 %
Eosinophils Absolute: 0.2 10*3/uL (ref 0.0–0.5)
Eosinophils Relative: 2 %
HCT: 49.9 % (ref 39.0–52.0)
Hemoglobin: 16.2 g/dL (ref 13.0–17.0)
Immature Granulocytes: 0 %
Lymphocytes Relative: 14 %
Lymphs Abs: 1.3 10*3/uL (ref 0.7–4.0)
MCH: 28.9 pg (ref 26.0–34.0)
MCHC: 32.5 g/dL (ref 30.0–36.0)
MCV: 88.9 fL (ref 80.0–100.0)
Monocytes Absolute: 0.8 10*3/uL (ref 0.1–1.0)
Monocytes Relative: 8 %
Neutro Abs: 7.1 10*3/uL (ref 1.7–7.7)
Neutrophils Relative %: 75 %
Platelets: 203 10*3/uL (ref 150–400)
RBC: 5.61 MIL/uL (ref 4.22–5.81)
RDW: 14.7 % (ref 11.5–15.5)
WBC: 9.5 10*3/uL (ref 4.0–10.5)
nRBC: 0 % (ref 0.0–0.2)

## 2021-06-17 LAB — COMPREHENSIVE METABOLIC PANEL
ALT: 30 U/L (ref 0–44)
AST: 24 U/L (ref 15–41)
Albumin: 3.7 g/dL (ref 3.5–5.0)
Alkaline Phosphatase: 59 U/L (ref 38–126)
Anion gap: 9 (ref 5–15)
BUN: 14 mg/dL (ref 6–20)
CO2: 27 mmol/L (ref 22–32)
Calcium: 9 mg/dL (ref 8.9–10.3)
Chloride: 101 mmol/L (ref 98–111)
Creatinine, Ser: 1.16 mg/dL (ref 0.61–1.24)
GFR, Estimated: >60 mL/min (ref >60)
Glucose, Bld: 103 mg/dL — ABNORMAL HIGH (ref 70–99)
Potassium: 4.2 mmol/L (ref 3.5–5.1)
Sodium: 137 mmol/L (ref 135–145)
Total Bilirubin: 1.2 mg/dL (ref 0.3–1.2)
Total Protein: 7.1 g/dL (ref 6.5–8.1)

## 2021-06-17 LAB — LACTATE DEHYDROGENASE: LDH: 139 U/L (ref 98–192)

## 2021-06-17 NOTE — Progress Notes (Signed)
Pt doing good no concerns. Did also have b/p elevation last week and he gets anxious if it goes over 150. The Er told him that that is ok and pt felt better after getting checked out

## 2021-06-17 NOTE — Progress Notes (Signed)
Hematology/Oncology Consult note [facility]  Telephone:([phone] Fax:([phone]  Patient Care Team: [name], MD (Inactive) as PCP - General (Internal Medicine) Wellington Hampshire, MD as PCP - Cardiology (Cardiology) Lequita Asal, MD (Inactive) as Medical Oncologist (Hematology and Oncology)   Name of the patient: Ronnie Mcintyre  [id number]  1966/02/16   Date of visit: 06/17/21  Diagnosis- history of large B-cell lymphoma 2019 currently in CR 1  Chief complaint/ Reason for visit-routine follow-up of diffuse large B-cell lymphoma  Heme/Onc history: Ronnie Mcintyre is a 55 y.o. 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  [facility] pathology revealed necrotic tissue.  [facility] consultation revealed neoplastic cells are positive for CD20, BCL-6, MUM-1, and BCL-2. The neoplastic cells were negative for CD30, CD3, , CD10, c-MYC, EBV ISH, AE1/3, and PAX-8. Ki67 is 60-70%. 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 The absence of CD30 expression makes primary mediastinal large B-cell lymphoma less likely. FISH for MYC rearrangement was negative. IPI score, age adjusted IPI, are low (1) and NCCN IPI score is low-intermediate (2).   He presented with upper chest fullness with radiation to his neck and arm.  He has had B symptoms (sweats and weight loss) worrisome for lymphoma.  LDH is 273.  Uric acid was normal.  Beta-HCG and AFP were normal on 01/16/2018.  On   Bone marrow biopsy on 02/03/2018 revealed a slightly hypercellular bone marrow for age with trilineage hematopoiesis.  There was no monoclonal B-cell population or abnormal T-cell phenotype identified.  Cytogenetics were normal (46, XY).   He received 6 cycles of RCHOP chemotherapy (02/16/2018 - 06/10/2018) with Neulasta support.  He received radiation (3600 cGy in 18 fractions) from 07/27/2018 -  08/19/2018.   Patient has had surveillance scans up until November 2021 Which showed small residual MR for soft tissue density in the mediastinum measuring 2.4 cm x 1.2 cm with no significant change over the last 1 year.    Interval history-patient was concerned about his increased blood pressure a few days ago and went to the ER to get evaluated.  Presently he feels well.  Denies any changes in his appetite or weight.  Denies any lumps or bumps anywhere or drenching night sweats.  ECOG PS- 1 Pain scale- 0   Review of systems- Review of Systems  Constitutional:  Negative for chills, fever, malaise/fatigue and weight loss.  HENT:  Negative for congestion, ear discharge and nosebleeds.   Eyes:  Negative for blurred vision.  Respiratory:  Negative for cough, hemoptysis, sputum production, shortness of breath and wheezing.   Cardiovascular:  Negative for chest pain, palpitations, orthopnea and claudication.  Gastrointestinal:  Negative for abdominal pain, blood in stool, constipation, diarrhea, heartburn, melena, nausea and vomiting.  Genitourinary:  Negative for dysuria, flank pain, frequency, hematuria and urgency.  Musculoskeletal:  Negative for back pain, joint pain and myalgias.  Skin:  Negative for rash.  Neurological:  Negative for dizziness, tingling, focal weakness, seizures, weakness and headaches.  Endo/Heme/Allergies:  Does not bruise/bleed easily.  Psychiatric/Behavioral:  Negative for depression and suicidal ideas. The patient does not have insomnia.      No Known Allergies   Past Medical History:  Diagnosis Date   Cancer ([place]) 12/2017   Non-Hogkins lumphoma   Hypertension      Past Surgical History:  Procedure Laterality Date   ANKLE SURGERY Right  PORTACATH PLACEMENT Right 02/11/2018   Procedure: INSERTION PORT-A-CATH;  Surgeon: Jules Husbands, MD;  Location: [facility] ORS;  Service: General;  Laterality: Right;    Social History   Socioeconomic History    Marital status: Single    Spouse name: Not on file   Number of children: Not on file   Years of education: Not on file   Highest education level: Not on file  Occupational History   Not on file  Tobacco Use   Smoking status: Never   Smokeless tobacco: Never  Vaping Use   Vaping Use: Never used  Substance and Sexual Activity   Alcohol use: Yes    Comment: rarely   Drug use: No   Sexual activity: Not Currently  Other Topics Concern   Not on file  Social History Narrative   Not on file   Social Determinants of Health   Financial Resource Strain: Not on file  Food Insecurity: Not on file  Transportation Needs: Not on file  Physical Activity: Not on file  Stress: Not on file  Social Connections: Not on file  Intimate Partner Violence: Not on file    Family History  Problem Relation Age of Onset   Hypertension Mother    Hypertension Father    Multiple myeloma Father      Current Outpatient Medications:    acetaminophen (TYLENOL) 325 MG tablet, Take 2 tablets (650 mg total) by mouth every 6 (six) hours as needed for mild pain (or Fever >/= 101)., Disp: , Rfl:    aspirin 81 MG tablet, Take 81 mg by mouth daily. , Disp: , Rfl:    carvedilol (COREG) 12.5 MG tablet, Take 1 tablet (12.5 mg total) by mouth 2 (two) times daily with a meal., Disp: 180 tablet, Rfl: 0   rosuvastatin (CRESTOR) 40 MG tablet, Take 1 tablet (40 mg total) by mouth daily., Disp: 90 tablet, Rfl: 1   sacubitril-valsartan (ENTRESTO) 49-51 MG, Take 1 tablet by mouth 2 (two) times daily., Disp: 60 tablet, Rfl: 11   spironolactone (ALDACTONE) 25 MG tablet, Take 0.5 tablets (12.5 mg total) by mouth daily., Disp: 45 tablet, Rfl: 2  Physical exam:  Vitals:   06/17/21 1328  BP: 115/71  Pulse: 74  Temp: 98.5 F (36.9 C)  TempSrc: Oral  Weight: 268 lb (121.6 kg)  Height: 6' (1.829 m)   Physical Exam Constitutional:      General: He is not in acute distress. Cardiovascular:     Rate and Rhythm: Normal  rate and regular rhythm.     Heart sounds: Normal heart sounds.  Pulmonary:     Effort: Pulmonary effort is normal.     Breath sounds: Normal breath sounds.  Abdominal:     General: Bowel sounds are normal.     Palpations: Abdomen is soft.  Lymphadenopathy:     Comments: No palpable cervical, supraclavicular, axillary or inguinal adenopathy    Skin:    General: Skin is warm and dry.  Neurological:     Mental Status: He is alert and oriented to person, place, and time.     CMP Latest Ref Rng & Units 06/17/2021  Glucose 70 - 99 mg/dL 103(H)  BUN 6 - 20 mg/dL 14  Creatinine 0.61 - 1.24 mg/dL 1.16  Sodium 135 - 145 mmol/L 137  Potassium 3.5 - 5.1 mmol/L 4.2  Chloride 98 - 111 mmol/L 101  CO2 22 - 32 mmol/L 27  Calcium 8.9 - 10.3 mg/dL 9.0  Total Protein 6.5 -  8.1 g/dL 7.1  Total Bilirubin 0.3 - 1.2 mg/dL 1.2  Alkaline Phos 38 - 126 U/L 59  AST 15 - 41 U/L 24  ALT 0 - 44 U/L 30   CBC Latest Ref Rng & Units 06/17/2021  WBC 4.0 - 10.5 K/uL 9.5  Hemoglobin 13.0 - 17.0 g/dL 16.2  Hematocrit 39.0 - 52.0 % 49.9  Platelets 150 - 400 K/uL 203    Assessment and plan- Patient is a 55 y.o. male with history of stage Ib bulky large B-cell lymphoma diagnosed in 2019 s/p 6 cycles of R-CHOP followed by involved field radiation currently in CR 1.  He is here for routine follow-up  Clinically patient is doing well with no concerning signs and symptoms of recurrence based on today's exam.  He does not require any surveillance imaging for his DLBCL at this time.  He had a residualSoft tissue density in the mediastinum which was overall stable to decreasing on his prior scan in November 2021.   I will see him back in 6 months with CBC with differential CMP and LDH   Visit Diagnosis 1. Encounter for follow-up surveillance of diffuse large B-cell lymphoma      Dr. Randa Evens, MD, MPH [facility] [phone] 06/17/2021 4:53 PM

## 2021-09-08 ENCOUNTER — Other Ambulatory Visit: Payer: Self-pay | Admitting: Family

## 2021-09-08 ENCOUNTER — Other Ambulatory Visit: Payer: Self-pay | Admitting: Cardiovascular Disease

## 2021-09-09 NOTE — Telephone Encounter (Signed)
Rx(s) sent to pharmacy electronically.  

## 2021-09-09 NOTE — Telephone Encounter (Signed)
Please schedule F/U appointment for 90 day refills. Thank you! 

## 2021-10-01 ENCOUNTER — Encounter: Payer: Self-pay | Admitting: Emergency Medicine

## 2021-10-01 ENCOUNTER — Other Ambulatory Visit: Payer: Self-pay

## 2021-10-01 ENCOUNTER — Observation Stay
Admission: EM | Admit: 2021-10-01 | Discharge: 2021-10-02 | Disposition: A | Discharge status: Home / Self Care | Payer: Commercial Managed Care - PPO | Attending: Internal Medicine | Admitting: Internal Medicine

## 2021-10-01 DIAGNOSIS — G459 Transient cerebral ischemic attack, unspecified: Principal | ICD-10-CM | POA: Insufficient documentation

## 2021-10-01 DIAGNOSIS — Z8572 Personal history of non-Hodgkin lymphomas: Secondary | ICD-10-CM | POA: Diagnosis not present

## 2021-10-01 DIAGNOSIS — Z7982 Long term (current) use of aspirin: Secondary | ICD-10-CM | POA: Diagnosis not present

## 2021-10-01 DIAGNOSIS — I10 Essential (primary) hypertension: Secondary | ICD-10-CM | POA: Diagnosis not present

## 2021-10-01 DIAGNOSIS — Z20822 Contact with and (suspected) exposure to covid-19: Secondary | ICD-10-CM | POA: Insufficient documentation

## 2021-10-01 DIAGNOSIS — G8194 Hemiplegia, unspecified affecting left nondominant side: Secondary | ICD-10-CM | POA: Diagnosis present

## 2021-10-01 DIAGNOSIS — Z79899 Other long term (current) drug therapy: Secondary | ICD-10-CM | POA: Diagnosis not present

## 2021-10-01 DIAGNOSIS — T451X5A Adverse effect of antineoplastic and immunosuppressive drugs, initial encounter: Secondary | ICD-10-CM | POA: Diagnosis present

## 2021-10-01 HISTORY — DX: Non-Hodgkin lymphoma, unspecified, unspecified site: C85.90

## 2021-10-01 NOTE — ED Triage Notes (Signed)
Patient ambulatory to triage with steady gait, without difficulty or distress noted; pt reports BP at home 168/112 accomp by "slight HA" and dizziness ?

## 2021-10-01 NOTE — ED Notes (Signed)
Pt ambulatory to rm 15. Pt placed on cardiac monitor. Joana, RN aware of pt being in rm. Call light within reach.  ?

## 2021-10-02 ENCOUNTER — Emergency Department: Payer: Commercial Managed Care - PPO

## 2021-10-02 ENCOUNTER — Encounter: Payer: Self-pay | Admitting: Family Medicine

## 2021-10-02 ENCOUNTER — Observation Stay (HOSPITAL_BASED_OUTPATIENT_CLINIC_OR_DEPARTMENT_OTHER)
Admit: 2021-10-02 | Discharge: 2021-10-02 | Disposition: A | Discharge status: Home / Self Care | Payer: Commercial Managed Care - PPO | Attending: Family Medicine | Admitting: Family Medicine

## 2021-10-02 ENCOUNTER — Observation Stay: Payer: Commercial Managed Care - PPO

## 2021-10-02 DIAGNOSIS — G459 Transient cerebral ischemic attack, unspecified: Secondary | ICD-10-CM

## 2021-10-02 DIAGNOSIS — I427 Cardiomyopathy due to drug and external agent: Secondary | ICD-10-CM

## 2021-10-02 DIAGNOSIS — T451X5A Adverse effect of antineoplastic and immunosuppressive drugs, initial encounter: Secondary | ICD-10-CM | POA: Diagnosis not present

## 2021-10-02 LAB — ECHOCARDIOGRAM COMPLETE
AR max vel: 2.64 cm2
AV Area VTI: 2.56 cm2
AV Area mean vel: 2.75 cm2
AV Mean grad: 1.5 mmHg
AV Peak grad: 2.8 mmHg
Ao pk vel: 0.83 m/s
Area-P 1/2: 6.17 cm2
Height: 72 in
MV VTI: 1.94 cm2
S' Lateral: 2.9 cm
Weight: 4160 oz

## 2021-10-02 LAB — URINALYSIS, ROUTINE W REFLEX MICROSCOPIC
Bacteria, UA: NONE SEEN
Bilirubin Urine: NEGATIVE
Glucose, UA: NEGATIVE mg/dL
Ketones, ur: 5 mg/dL — AB
Leukocytes,Ua: NEGATIVE
Nitrite: NEGATIVE
Protein, ur: 100 mg/dL — AB
Specific Gravity, Urine: 1.012 (ref 1.005–1.030)
pH: 5 (ref 5.0–8.0)

## 2021-10-02 LAB — URINE DRUG SCREEN, QUALITATIVE (ARMC ONLY)
Amphetamines, Ur Screen: NOT DETECTED
Barbiturates, Ur Screen: NOT DETECTED
Benzodiazepine, Ur Scrn: NOT DETECTED
Cannabinoid 50 Ng, Ur ~~LOC~~: NOT DETECTED
Cocaine Metabolite,Ur ~~LOC~~: NOT DETECTED
MDMA (Ecstasy)Ur Screen: NOT DETECTED
Methadone Scn, Ur: NOT DETECTED
Opiate, Ur Screen: NOT DETECTED
Phencyclidine (PCP) Ur S: NOT DETECTED
Tricyclic, Ur Screen: NOT DETECTED

## 2021-10-02 LAB — CBC
HCT: 52.4 % — ABNORMAL HIGH (ref 39.0–52.0)
Hemoglobin: 17.4 g/dL — ABNORMAL HIGH (ref 13.0–17.0)
MCH: 28.8 pg (ref 26.0–34.0)
MCHC: 33.2 g/dL (ref 30.0–36.0)
MCV: 86.8 fL (ref 80.0–100.0)
Platelets: 213 10*3/uL (ref 150–400)
RBC: 6.04 MIL/uL — ABNORMAL HIGH (ref 4.22–5.81)
RDW: 14.4 % (ref 11.5–15.5)
WBC: 11.6 10*3/uL — ABNORMAL HIGH (ref 4.0–10.5)
nRBC: 0 % (ref 0.0–0.2)

## 2021-10-02 LAB — BASIC METABOLIC PANEL
Anion gap: 9 (ref 5–15)
BUN: 17 mg/dL (ref 6–20)
CO2: 26 mmol/L (ref 22–32)
Calcium: 9.3 mg/dL (ref 8.9–10.3)
Chloride: 99 mmol/L (ref 98–111)
Creatinine, Ser: 1.23 mg/dL (ref 0.61–1.24)
GFR, Estimated: >60 mL/min (ref >60)
Glucose, Bld: 97 mg/dL (ref 70–99)
Potassium: 4 mmol/L (ref 3.5–5.1)
Sodium: 134 mmol/L — ABNORMAL LOW (ref 135–145)

## 2021-10-02 LAB — RESP PANEL BY RT-PCR (FLU A&B, COVID) ARPGX2
Influenza A by PCR: NEGATIVE
Influenza B by PCR: NEGATIVE
SARS Coronavirus 2 by RT PCR: NEGATIVE

## 2021-10-02 LAB — APTT: aPTT: 28 seconds (ref 24–36)

## 2021-10-02 LAB — ETHANOL: Alcohol, Ethyl (B): <10 mg/dL (ref <10)

## 2021-10-02 LAB — TROPONIN I (HIGH SENSITIVITY)
Troponin I (High Sensitivity): 7 ng/L (ref <18)
Troponin I (High Sensitivity): 9 ng/L (ref <18)

## 2021-10-02 LAB — PROTIME-INR
INR: 1 (ref 0.8–1.2)
Prothrombin Time: 13.4 seconds (ref 11.4–15.2)

## 2021-10-02 LAB — LIPID PANEL
Cholesterol: 154 mg/dL (ref 0–200)
HDL: 35 mg/dL — ABNORMAL LOW (ref >40)
LDL Cholesterol: 89 mg/dL (ref 0–99)
Total CHOL/HDL Ratio: 4.4 RATIO
Triglycerides: 152 mg/dL — ABNORMAL HIGH (ref <150)
VLDL: 30 mg/dL (ref 0–40)

## 2021-10-02 LAB — HIV ANTIBODY (ROUTINE TESTING W REFLEX): HIV Screen 4th Generation wRfx: NONREACTIVE

## 2021-10-02 MED ORDER — CLOPIDOGREL BISULFATE 75 MG PO TABS
75.0000 mg | ORAL_TABLET | Freq: Every day | ORAL | Status: DC
  Administered 2021-10-02: 75 mg via ORAL
  Filled 2021-10-02: qty 1

## 2021-10-02 MED ORDER — ACETAMINOPHEN 160 MG/5ML PO SOLN
650.0000 mg | ORAL | Status: DC | PRN
  Filled 2021-10-02: qty 20.3

## 2021-10-02 MED ORDER — ENOXAPARIN SODIUM 60 MG/0.6ML IJ SOSY
0.5000 mg/kg | PREFILLED_SYRINGE | INTRAMUSCULAR | Status: DC
  Administered 2021-10-02: 60 mg via SUBCUTANEOUS
  Filled 2021-10-02: qty 0.6

## 2021-10-02 MED ORDER — CLOPIDOGREL BISULFATE 75 MG PO TABS: 75.0000 mg | ORAL_TABLET | Freq: Every day | ORAL | 2 refills | Status: DC

## 2021-10-02 MED ORDER — EZETIMIBE 10 MG PO TABS
10.0000 mg | ORAL_TABLET | Freq: Every day | ORAL | Status: DC
  Administered 2021-10-02: 10 mg via ORAL
  Filled 2021-10-02: qty 1

## 2021-10-02 MED ORDER — ROSUVASTATIN CALCIUM 20 MG PO TABS
40.0000 mg | ORAL_TABLET | Freq: Every day | ORAL | Status: DC
  Administered 2021-10-02: 40 mg via ORAL
  Filled 2021-10-02: qty 2

## 2021-10-02 MED ORDER — ASPIRIN EC 81 MG PO TBEC
81.0000 mg | DELAYED_RELEASE_TABLET | Freq: Every day | ORAL | Status: DC
  Administered 2021-10-02: 81 mg via ORAL
  Filled 2021-10-02: qty 1

## 2021-10-02 MED ORDER — ACETAMINOPHEN 650 MG RE SUPP
650.0000 mg | RECTAL | Status: DC | PRN
Start: 2021-10-02 — End: 2021-10-02

## 2021-10-02 MED ORDER — PERFLUTREN LIPID MICROSPHERE
1.0000 mL | INTRAVENOUS | Status: AC | PRN
  Administered 2021-10-02: 2 mL via INTRAVENOUS
  Filled 2021-10-02: qty 10

## 2021-10-02 MED ORDER — SENNOSIDES-DOCUSATE SODIUM 8.6-50 MG PO TABS: 1.0000 | ORAL_TABLET | Freq: Every evening | ORAL | Status: DC | PRN

## 2021-10-02 MED ORDER — ASPIRIN 81 MG PO CHEW
324.0000 mg | CHEWABLE_TABLET | Freq: Once | ORAL | Status: AC
  Administered 2021-10-02: 243 mg via ORAL
  Filled 2021-10-02: qty 4

## 2021-10-02 MED ORDER — SACUBITRIL-VALSARTAN 49-51 MG PO TABS
1.0000 | ORAL_TABLET | Freq: Two times a day (BID) | ORAL | Status: DC
  Administered 2021-10-02: 1 via ORAL
  Filled 2021-10-02: qty 1

## 2021-10-02 MED ORDER — ACETAMINOPHEN 500 MG PO TABS
1000.0000 mg | ORAL_TABLET | Freq: Once | ORAL | Status: AC
  Administered 2021-10-02: 1000 mg via ORAL
  Filled 2021-10-02: qty 2

## 2021-10-02 MED ORDER — ACETAMINOPHEN 325 MG PO TABS: 650.0000 mg | ORAL_TABLET | ORAL | Status: DC | PRN

## 2021-10-02 MED ORDER — SODIUM CHLORIDE 0.9% FLUSH
3.0000 mL | Freq: Two times a day (BID) | INTRAVENOUS | Status: DC
  Administered 2021-10-02 (×2): 3 mL via INTRAVENOUS

## 2021-10-02 MED ORDER — STROKE: EARLY STAGES OF RECOVERY BOOK: Freq: Once | Status: DC

## 2021-10-02 MED ORDER — SPIRONOLACTONE 25 MG PO TABS
12.5000 mg | ORAL_TABLET | Freq: Every day | ORAL | Status: DC
  Administered 2021-10-02: 12.5 mg via ORAL
  Filled 2021-10-02: qty 0.5
  Filled 2021-10-02: qty 1

## 2021-10-02 MED ORDER — CARVEDILOL 6.25 MG PO TABS
12.5000 mg | ORAL_TABLET | Freq: Two times a day (BID) | ORAL | Status: DC
  Administered 2021-10-02: 12.5 mg via ORAL
  Filled 2021-10-02: qty 2

## 2021-10-02 MED ORDER — SODIUM CHLORIDE 0.9% FLUSH: 3.0000 mL | INTRAVENOUS | Status: DC | PRN

## 2021-10-02 MED ORDER — EZETIMIBE 10 MG PO TABS: 10.0000 mg | ORAL_TABLET | Freq: Every day | ORAL | 2 refills | Status: DC

## 2021-10-02 MED ORDER — SODIUM CHLORIDE 0.9 % IV SOLN: 250.0000 mL | INTRAVENOUS | Status: DC | PRN

## 2021-10-02 NOTE — Progress Notes (Signed)
Anticoagulation monitoring(Lovenox): ? ? 56 yo  male ordered Lovenox 40 mg Q24h ?   ?Filed Weights  ? 10/01/21 2344  ?Weight: 117.9 kg (260 lb)  ? ?BMI 35.3   ? ?Lab Results  ?Component Value Date  ? CREATININE 1.23 10/02/2021  ? CREATININE 1.16 06/17/2021  ? CREATININE 1.30 (H) 06/08/2021  ? ?Estimated Creatinine Clearance: 91 mL/min (by C-G formula based on SCr of 1.23 mg/dL). ?Hemoglobin & Hematocrit  ?   ?Component Value Date/Time  ? HGB 17.4 (H) 10/02/2021 0000  ? HGB 11.7 (L) 02/10/2018 1610  ? HCT 52.4 (H) 10/02/2021 0000  ? ? ? ?Per Protocol for Patient with estCrcl > 30 ml/min and BMI > 30, will transition to Lovenox 60 mg Q24h.  ?  ? ? ?

## 2021-10-02 NOTE — Progress Notes (Signed)
SLP Cancellation Note ? ?Patient Details ?Name: Ronnie Mcintyre ?MRN: [id number] ?DOB: 10-08-1965 ? ? ?Cancelled treatment:       Reason Eval/Treat Not Completed: SLP screened, no needs identified, will sign off (chart reviewed; consulted NSG and met w/ pt/family in room) ?Pt denied any difficulty swallowing and is currently on a regular diet; tolerates swallowing pills w/ water per NSG. Pt had recently completed lunch meal w/out concern. Family agreed. Pt conversed in conversation w/out expressive/receptive deficits noted; pt denied any speech-language deficits. Speech clear. He described his eagerness to get home to celebrate his Rudene Anda w/ chocolate cake tonight.  ?No further skilled ST services indicated as pt appears at his baseline. Pt agreed. NSG to reconsult if any change in status while admitted.   ? ? ? ? ? ? ?Orinda Kenner, MS, CCC-SLP ?Speech Language Pathologist ?[facility]; [place] ?[phone] (ascom) ?Tniya Bowditch ?10/02/2021, 3:00 PM ?

## 2021-10-02 NOTE — Progress Notes (Signed)
*  PRELIMINARY RESULTS* ?Echocardiogram ?2D Echocardiogram has been performed. ? ?Ronnie Mcintyre, Ronnie Mcintyre ?10/02/2021, 11:33 AM ?

## 2021-10-02 NOTE — ED Provider Notes (Signed)
? ?[facility] ?Provider Note ? ? ? Event Date/Time  ? First MD Initiated Contact with Patient 10/01/21 2353   ?  (approximate) ? ? ?History  ? ?Dizziness ? ? ?HPI ? ?Ronnie Mcintyre is a 56 y.o. male with a history of non-Hodgkin's lymphoma in remission and hypertension who presents for evaluation of dizziness and elevated blood pressure.  Patient reports that he his blood pressure is usually in the 140s at home.  This evening he took a shower and when he got out of the shower he reports that he felt a little jittery.  He describes that this is how he feels when his pressure is elevated.  He then started having pins-and-needles sensation in his left foot and then eventually in his left arm.  He check his blood pressure which was in the 170s.  He also had a little faint 2 out of 10 headache and felt dizzy which she describes feeling slight off balance.  That is when he decided to drive to the hospital to get checked out.  He describes a prior episode of TIA on his father while he was battling multiple myeloma.  He denies any personal history of stroke, he is not a smoker.  At this point he describes very minimal paresthesias of his left foot but no other symptoms.  He denies facial droop, slurred speech, difficulty finding words, unilateral weakness.  No chest pain ?  ? ? ?Past Medical History:  ?Diagnosis Date  ? Cancer ([place]) 12/2017  ? Non-Hogkins lumphoma  ? Hypertension   ? Non Hodgkin's lymphoma ([place])   ? ? ?Past Surgical History:  ?Procedure Laterality Date  ? ANKLE SURGERY Right   ? PORTACATH PLACEMENT Right 02/11/2018  ? Procedure: INSERTION PORT-A-CATH;  Surgeon: Jules Husbands, MD;  Location: [facility] ORS;  Service: General;  Laterality: Right;  ? ? ? ?Physical Exam  ? ?Triage Vital Signs: ?ED Triage Vitals [10/01/21 2344]  ?Enc Vitals Group  ?   BP (!) 159/108  ?   Pulse Rate 98  ?   Resp 20  ?   Temp 98.2 ?F (36.8 ?C)  ?   Temp Source Oral  ?   SpO2 100 %  ?   Weight 260 lb (117.9 kg)   ?   Height 6' (1.829 m)  ?   Head Circumference   ?   Peak Flow   ?   Pain Score 3  ?   Pain Loc   ?   Pain Edu?   ?   Excl. in [place]?   ? ? ?Most recent vital signs: ?Vitals:  ? 10/02/21 0000 10/02/21 0030  ?BP: (!) 142/94 (!) 128/98  ?Pulse: 93 97  ?Resp: 18 13  ?Temp:    ?SpO2: 97% 98%  ? ? ? ?Constitutional: Alert and oriented. Well appearing and in no apparent distress. ?HEENT: ?     Head: Normocephalic and atraumatic.    ?     Eyes: Conjunctivae are normal. Sclera is non-icteric. EOMI, PERRL, no nystagmus ?     Mouth/Throat: Mucous membranes are moist.  ?     Neck: Supple with no signs of meningismus. ?Cardiovascular: Regular rate and rhythm. No murmurs, gallops, or rubs. 2+ symmetrical distal pulses are present in all extremities.  ?Respiratory: Normal respiratory effort. Lungs are clear to auscultation bilaterally.  ?Gastrointestinal: Soft, non tender, and non distended with positive bowel sounds. No rebound or guarding. ?Genitourinary: No CVA tenderness. ?Musculoskeletal:  No edema,  cyanosis, or erythema of extremities. ?Neurologic: Normal speech and language. Face is symmetric.  Intact strength and sensation x4, no pronator drift, no dysmetria, normal gait  ?skin: Skin is warm, dry and intact. No rash noted. ?Psychiatric: Mood and affect are normal. Speech and behavior are normal. ? ?ED Results / Procedures / Treatments  ? ?Labs ?(all labs ordered are listed, but only abnormal results are displayed) ?Labs Reviewed  ?CBC - Abnormal; Notable for the following components:  ?    Result Value  ? WBC 11.6 (*)   ? RBC 6.04 (*)   ? Hemoglobin 17.4 (*)   ? HCT 52.4 (*)   ? All other components within normal limits  ?BASIC METABOLIC PANEL - Abnormal; Notable for the following components:  ? Sodium 134 (*)   ? All other components within normal limits  ?RESP PANEL BY RT-PCR (FLU A&B, COVID) ARPGX2  ?ETHANOL  ?PROTIME-INR  ?APTT  ?URINE DRUG SCREEN, QUALITATIVE ([facility] ONLY)  ?URINALYSIS, ROUTINE W REFLEX MICROSCOPIC   ?TROPONIN I (HIGH SENSITIVITY)  ? ? ? ?EKG ? ?ED ECG REPORT ?I, Rudene Re, the attending physician, personally viewed and interpreted this ECG. ? ?Normal sinus rhythm with a rate of 97, left axis deviation, no ST elevations or depressions, inferior Q waves.  Unchanged from prior ? ?RADIOLOGY ?I, Rudene Re, attending MD, have personally viewed and interpreted the images obtained during this visit as below: ? ?CT head negative for acute pathology ? ? ?___________________________________________________ ?Interpretation by Radiologist:  ?[place] (5MM) ? ?Result Date: 10/02/2021 ?CLINICAL DATA:  Numbness or tingling.  Paresthesia. EXAM: CT HEAD WITHOUT CONTRAST TECHNIQUE: Contiguous axial images were obtained from the base of the skull through the vertex without intravenous contrast. RADIATION DOSE REDUCTION: This exam was performed according to the departmental dose-optimization program which includes automated exposure control, adjustment of the mA and/or kV according to patient size and/or use of iterative reconstruction technique. COMPARISON:  None. FINDINGS: Brain: The ventricles and sulci are appropriate size for the patient's age. The gray-white matter discrimination is preserved. There is no acute intracranial hemorrhage. No mass effect or midline shift. No extra-axial fluid collection. Vascular: No hyperdense vessel or unexpected calcification. Skull: Normal. Negative for fracture or focal lesion. Sinuses/Orbits: The visualized paranasal sinuses and mastoid air cells are clear. There is a 3 cm left maxillary sinus retention cyst or polyp. Other: Small calcified focus over the left parietal scalp, likely sequela of prior insult. IMPRESSION: No acute intracranial pathology. Electronically Signed   By: Anner Crete M.D.   On: 10/02/2021 00:24   ? ? ? ?PROCEDURES: ? ?Critical Care performed: No ? ?Procedures ? ? ? ?IMPRESSION / MDM / ASSESSMENT AND PLAN / ED COURSE  ?I reviewed the  triage vital signs and the nursing notes. ? ? 56 y.o. male with a history of non-Hodgkin's lymphoma in remission and hypertension who presents for evaluation of elevated blood pressure, slight headache, pins-and-needles of the left arm and left foot, and BP elevated to the 170s.  During my examination patient is slightly anxious but in no distress.  Is completely neurologically intact.  His BP is 142/94. ? ?Ddx: TIA versus stroke versus head bleed versus hypertensive urgency versus anxiety versus complex migraine ? ? ?Plan: Head CT, EKG, troponin, CBC, metabolic panel.  We will treat the headache with Tylenol. ? ? ?MEDICATIONS GIVEN IN ED: ?Medications  ?acetaminophen (TYLENOL) tablet 1,000 mg (1,000 mg Oral Given 10/02/21 0013)  ?aspirin chewable tablet 324 mg (243 mg  Oral Given 10/02/21 0053)  ? ? ? ?ED COURSE: Head CT is negative.  EKG with no signs of A-fib or dysrhythmias.  Labs with no signs of hyperglycemia, electrolyte derangements, AKI.  Troponin is negative.  Hospitalist service was consulted for admission for TIA work-up and patient has been accepted to their service after discussion.  Patient was given a full dose aspirin. ? ? ?Consults: Hospitalist ? ? ?EMR reviewed including patient's last visit with his oncologist for surveillance for his large B-cell lymphoma from November 2022 ? ? ? ?FINAL CLINICAL IMPRESSION(S) / ED DIAGNOSES  ? ?Final diagnoses:  ?TIA (transient ischemic attack)  ? ? ? ?Rx / DC Orders  ? ?ED Discharge Orders   ? ? None  ? ?  ? ? ? ?Note:  This document was prepared using Dragon voice recognition software and may include unintentional dictation errors. ? ? ?Please note:  Patient was evaluated in Emergency Department today for the symptoms described in the history of present illness. Patient was evaluated in the context of the global COVID-19 pandemic, which necessitated consideration that the patient might be at risk for infection with the SARS-CoV-2 virus that causes COVID-19.  Institutional protocols and algorithms that pertain to the evaluation of patients at risk for COVID-19 are in a state of rapid change based on information released by regulatory bodies including the CDC and fede

## 2021-10-02 NOTE — Progress Notes (Signed)
PT Screen Note ? ?Patient Details ?Name: Ronnie Mcintyre ?MRN: [id number] ?DOB: 1965-10-12 ? ? ?Cancelled Treatment:    Reason Eval/Treat Not Completed: PT screened, no needs identified, will sign off ?Pt seen on his birthday after transient L U&LE numbness that had resolved at time of PT assessment.  He had WNL strength, coordination, etc in all extremities and was able to easily, safely and confidently ambulate 250 ft w/o AD and community appropriate speed.  He was also able to easily negotiate up down steps and reports feeling back to baseline. He did have some elevated BP during screen: 129/98 in supine on arrival and in the ~160/~110 range after ambulation.  Pt does not need further PT intervention and once medically clear is safe to manage at home. ? ?Kreg Shropshire, DPT ?10/02/2021, 10:52 AM ?

## 2021-10-02 NOTE — Progress Notes (Signed)
OT Cancellation Note ? ?Patient Details ?Name: Ronnie Mcintyre ?MRN: [id number] ?DOB: 09-09-1965 ? ? ?Cancelled Treatment:    Reason Eval/Treat Not Completed: OT screened, no needs identified, will sign off. Order received, chart reviewed. Pt back to baseline functional independence. No skilled OT needs identified. Will sign off. Please re-consult if additional needs arise.  ? ?Dessie Coma, M.S. OTR/L  ?10/02/21, 9:18 AM  ?ascom [phone] ? ?

## 2021-10-02 NOTE — H&P (Signed)
History and Physical    Ronnie Mcintyre:[id number] DOB: 28-Apr-1966 DOA: 10/01/2021  PCP: Pcp, No   Patient coming from: home   Chief Complaint: Left-sided numbness, high BP   HPI: Ronnie Mcintyre is a pleasant 56 y.o. 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  The patient reports that he had been in his usual state of health and had an uneventful day until approximately 8 PM when he noted slight headache and lightheadedness with numbness and tingling in his left foot and then left arm as well.  He reports his blood pressures typically 120s systolic but was 147/829 when the symptoms were occurring.  Symptoms persisted and he came into the ED for evaluation.  He denies any weakness, change in vision or hearing, chest discomfort, or palpitations.  ED Course: Upon arrival to the ED, patient is found to be afebrile and saturating well on room air with blood pressure 159/108.  EKG features sinus rhythm with LAFB.  Head CT negative for acute intracranial abnormality.  Chemistry panel notable for slight hyponatremia and CBC features a mild leukocytosis and polycythemia.  Symptoms resolved in the ED, he passed swallow screen, was given 324 mg of aspirin, and hospitalists were asked to admit for TIA work-up.  Review of Systems:  All other systems reviewed and apart from HPI, are negative.  Past Medical History:  Diagnosis Date   Cancer (HCC) 12/2017   Non-Hogkins lumphoma   Hypertension    Non Hodgkin's lymphoma (HCC)     Past Surgical History:  Procedure Laterality Date   ANKLE SURGERY Right    PORTACATH PLACEMENT Right 02/11/2018   Procedure: INSERTION PORT-A-CATH;  Surgeon: Leafy Ro, MD;  Location: [facility] ORS;  Service: General;  Laterality: Right;     Social History:   reports that he has never smoked. He has never used smokeless tobacco. He reports current alcohol use. He reports that he does not use drugs.  No Known Allergies  Family History  Problem Relation Age of Onset   Hypertension Mother    Hypertension Father    Multiple myeloma Father      Prior to Admission medications   Medication Sig Start Date End Date Taking? Authorizing Provider  acetaminophen (TYLENOL) 325 MG tablet Take 2 tablets (650 mg total) by mouth every 6 (six) hours as needed for mild pain (or Fever >/= 101). 01/16/18  Yes Auburn Bilberry, MD  aspirin 81 MG tablet Take 81 mg by mouth daily.    Yes [provider]  carvedilol (COREG) 12.5 MG tablet TAKE 1 TABLET (12.5MG  TOTAL) BY MOUTH TWICE A DAY WITH MEALS 09/10/21  Yes Iran Ouch, MD  rosuvastatin (CRESTOR) 40 MG tablet Take 1 tablet (40 mg total) by mouth daily. 01/23/21  Yes Alver Sorrow, NP  sacubitril-valsartan (ENTRESTO) 49-51 MG Take 1 tablet by mouth 2 (two) times daily. 12/11/20  Yes Alver Sorrow, NP  spironolactone (ALDACTONE) 25 MG tablet TAKE 1/2 TABLET BY MOUTH EVERY DAY 09/09/21  Yes Alver Sorrow, NP    Physical Exam: Vitals:   10/01/21 2344 10/02/21 0000 10/02/21 0030  BP: (!) 159/108 (!) 142/94 (!) 128/98  Pulse: 98 93 97  Resp: 20 18 13   Temp: 98.2 F (36.8 C)    TempSrc: Oral    SpO2: 100% 97% 98%  Weight:  117.9 kg    Height: 6' (1.829 m)      Constitutional: NAD, calm  Eyes: PERTLA, lids and conjunctivae normal ENMT: Mucous membranes are moist. Posterior pharynx clear of any exudate or lesions.   Neck: supple, no masses  Respiratory:  no wheezing, no crackles. No accessory muscle use.  Cardiovascular: S1 & S2 heard, regular rate and rhythm. No extremity edema.   Abdomen: No distension, no tenderness, soft. Bowel sounds active.  Musculoskeletal: no clubbing / cyanosis. No joint deformity upper and lower extremities.   Skin: no significant  rashes, lesions, ulcers. Warm, dry, well-perfused. Neurologic: CN 2-12 grossly intact. Sensation to light touch intact. Strength 5/5 in all 4 limbs. Alert and oriented.  Psychiatric: Pleasant. Cooperative.    Labs and Imaging on Admission: I have personally reviewed following labs and imaging studies  CBC: Recent Labs  Lab 10/02/21 0000  WBC 11.6*  HGB 17.4*  HCT 52.4*  MCV 86.8  PLT 213   Basic Metabolic Panel: Recent Labs  Lab 10/02/21 0000  NA 134*  K 4.0  CL 99  CO2 26  GLUCOSE 97  BUN 17  CREATININE 1.23  CALCIUM 9.3   GFR: Estimated Creatinine Clearance: 91 mL/min (by C-G formula based on SCr of 1.23 mg/dL). Liver Function Tests: No results for input(s): AST, ALT, ALKPHOS, BILITOT, PROT, ALBUMIN in the last 168 hours. No results for input(s): LIPASE, AMYLASE in the last 168 hours. No results for input(s): AMMONIA in the last 168 hours. Coagulation Profile: Recent Labs  Lab 10/02/21 0111  INR 1.0   Cardiac Enzymes: No results for input(s): CKTOTAL, CKMB, CKMBINDEX, TROPONINI in the last 168 hours. BNP (last 3 results) No results for input(s): PROBNP in the last 8760 hours. HbA1C: No results for input(s): HGBA1C in the last 72 hours. CBG: No results for input(s): GLUCAP in the last 168 hours. Lipid Profile: No results for input(s): CHOL, HDL, LDLCALC, TRIG, CHOLHDL, LDLDIRECT in the last 72 hours. Thyroid Function Tests: No results for input(s): TSH, T4TOTAL, FREET4, T3FREE, THYROIDAB in the last 72 hours. Anemia Panel: No results for input(s): VITAMINB12, FOLATE, FERRITIN, TIBC, IRON, RETICCTPCT in the last 72 hours. Urine analysis:    Component Value Date/Time   COLORURINE YELLOW (A) 10/02/2021 0111   APPEARANCEUR CLEAR (A) 10/02/2021 0111   LABSPEC 1.012 10/02/2021 0111   PHURINE 5.0 10/02/2021 0111   GLUCOSEU NEGATIVE 10/02/2021 0111   HGBUR SMALL (A) 10/02/2021 0111   BILIRUBINUR NEGATIVE 10/02/2021 0111   KETONESUR 5 (A) 10/02/2021 0111    PROTEINUR 100 (A) 10/02/2021 0111   NITRITE NEGATIVE 10/02/2021 0111   LEUKOCYTESUR NEGATIVE 10/02/2021 0111   Sepsis Labs: @LABRCNTIP (procalcitonin:4,lacticidven:4) ) Recent Results (from the past 240 hour(s))  Resp Panel by RT-PCR (Flu A&B, Covid) Urine, Clean Catch     Status: None   Collection Time: 10/02/21  1:11 AM   Specimen: Urine, Clean Catch; Nasopharyngeal(NP) swabs in vial transport medium  Result Value Ref Range Status   SARS Coronavirus 2 by RT PCR NEGATIVE NEGATIVE Final    Comment: (NOTE) SARS-CoV-2 target nucleic acids are NOT DETECTED.  The SARS-CoV-2 RNA is generally detectable in upper respiratory specimens during the acute phase of infection. The lowest concentration of SARS-CoV-2 viral copies this assay can detect is 138 copies/mL. A negative result does not preclude SARS-Cov-2 infection and should not be used as the sole basis for treatment or other patient management decisions. A negative result may occur with  improper specimen collection/handling, submission of specimen other  than nasopharyngeal swab, presence of viral mutation(s) within the areas targeted by this assay, and inadequate number of viral copies(<138 copies/mL). A negative result must be combined with clinical observations, patient history, and epidemiological information. The expected result is Negative.  Fact Sheet for Patients:  BloggerCourse.com  Fact Sheet for Healthcare Providers:  SeriousBroker.it  This test is no t yet approved or cleared by the [place] FDA and  has been authorized for detection and/or diagnosis of SARS-CoV-2 by FDA under an Emergency Use Authorization (EUA). This EUA will remain  in effect (meaning this test can be used) for the duration of the COVID-19 declaration under Section 564(b)(1) of the Act, 21 U.S.C.section 360bbb-3(b)(1), unless the authorization is terminated  or revoked sooner.        Influenza A by PCR NEGATIVE NEGATIVE Final   Influenza B by PCR NEGATIVE NEGATIVE Final    Comment: (NOTE) The Xpert Xpress SARS-CoV-2/FLU/RSV plus assay is intended as an aid in the diagnosis of influenza from Nasopharyngeal swab specimens and should not be used as a sole basis for treatment. Nasal washings and aspirates are unacceptable for Xpert Xpress SARS-CoV-2/FLU/RSV testing.  Fact Sheet for Patients: BloggerCourse.com  Fact Sheet for Healthcare Providers: SeriousBroker.it  This test is not yet approved or cleared by the [place] FDA and has been authorized for detection and/or diagnosis of SARS-CoV-2 by FDA under an Emergency Use Authorization (EUA). This EUA will remain in effect (meaning this test can be used) for the duration of the COVID-19 declaration under Section 564(b)(1) of the Act, 21 U.S.C. section 360bbb-3(b)(1), unless the authorization is terminated or revoked.  Performed at [facility], [address] [place], [place] [address]      Radiological Exams on Admission: CT HEAD WO CONTRAST ( )  Result Date: 10/02/2021 CLINICAL DATA:  Numbness or tingling.  Paresthesia. EXAM: CT HEAD WITHOUT CONTRAST TECHNIQUE: Contiguous axial images were obtained from the base of the skull through the vertex without intravenous contrast. RADIATION DOSE REDUCTION: This exam was performed according to the departmental dose-optimization program which includes automated exposure control, adjustment of the mA and/or kV according to patient size and/or use of iterative reconstruction technique. COMPARISON:  None. FINDINGS: Brain: The ventricles and sulci are appropriate size for the patient's age. The gray-white matter discrimination is preserved. There is no acute intracranial hemorrhage. No mass effect or midline shift. No extra-axial fluid collection. Vascular: No hyperdense vessel or unexpected calcification. Skull:  Normal. Negative for fracture or focal lesion. Sinuses/Orbits: The visualized paranasal sinuses and mastoid air cells are clear. There is a 3 cm left maxillary sinus retention cyst or polyp. Other: Small calcified focus over the left parietal scalp, likely sequela of prior insult. IMPRESSION: No acute intracranial pathology. Electronically Signed   By: Elgie Collard M.D.   On: 10/02/2021 00:24    EKG: Independently reviewed. Sinus rhythm, LAFB.   Assessment/Plan  1. Left-sided numbness  - Presents with elevated BP and numbness involving left foot and arm, now resolved  - No acute findings on head CT  - Check MRI brain, carotid imaging, echocardiogram, A1c, and lipids; continue neuro checks and cardiac monitoring, continue ASA and statin    2. Non-ischemic cardiomyopathy  - Appears compensated  - Continue Coreg, Entresto, Aldactone     DVT prophylaxis: Lovenox  Code Status: Full  Level of Care: Level of care: Telemetry Medical Family Communication: None present  Disposition Plan:  Patient is from: Home  Anticipated d/c is to: Home  Anticipated  d/c date is: 10/03/21 Patient currently: Pending TIA workup  Consults called: none  Admission status: Observation     Briscoe Deutscher, MD [facility]  10/02/2021, 2:16 AM

## 2021-10-02 NOTE — Discharge Summary (Signed)
?[facility] ? ?Physician Discharge Summary  ? ?Patient ID: ?Ronnie Mcintyre ?MRN: [id number] ?DOB/AGE: 03-05-1966 56 y.o. ? ?Admit date: 10/01/2021 ?Discharge date: 10/02/2021   ? ?PCP: Pcp, No ? ?DISCHARGE DIAGNOSES:  ?Principal Problem: ?  TIA (transient ischemic attack) ?Active Problems: ?  Cardiomyopathy due to chemotherapy [facility]) ? ? ? ?RECOMMENDATIONS FOR OUTPATIENT FOLLOW UP: ?Ambulatory referral sent to neurology ? ? ?Home Health: None ?Equipment/Devices: None ? ?CODE STATUS: Full code ? ?DISCHARGE CONDITION: fair ? ?Diet recommendation: Heart healthy as before ? ?INITIAL HISTORY: ?56 y.o. 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  The patient reports that he had been in his usual state of health and had an uneventful day until approximately 8 PM when he noted slight headache and lightheadedness with numbness and tingling in his left foot and then left arm as well.  He reports his blood pressures typically 409W systolic but was 119/147 when the symptoms were occurring.  Symptoms persisted and he came into the ED for evaluation.  He denies any weakness, change in vision or hearing, chest discomfort, or palpitations. ?  ?ED Course: Upon arrival to the ED, patient is found to be afebrile and saturating well on room air with blood pressure 159/108.  EKG features sinus rhythm with LAFB.  Head CT negative for acute intracranial abnormality.  Chemistry panel notable for slight hyponatremia and CBC features a mild leukocytosis and polycythemia.  Symptoms resolved in the ED, he passed swallow screen, was given 324 mg of aspirin, and hospitalists were asked to admit for TIA work-up. ?  ? ?HOSPITAL COURSE:  ? ?Patient admitted with transient left-sided numbness involving his left foot  and hand.  It was resolved by the time he was evaluated in the emergency department.  Imaging studies were unremarkable for stroke.  Carotid Dopplers did not show any significant stenosis.  Echocardiogram showed stable findings.  He has known cardiomyopathy.  LDL noted to be 89.  Patient already on maximum dose of Crestor.  Zetia will be added.  HbA1c 5.4.  HIV nonreactive.  Urine drug screen was unremarkable. ?Patient was taking aspirin prior to admission.  Switched over to Plavix.  Discussed briefly with neurology.  Outpatient referral sent to [facility]. ? ?Cardiomyopathy stable.  He was continued on his home medication regimen. ? ?Obesity ?Estimated body mass index is 35.26 kg/m? as calculated from the following: ?  Height as of this encounter: 6' (1.829 m). ?  Weight as of this encounter: 117.9 kg. ? ?Patient is stable.  Symptoms have resolved.  Ambulated without difficulty.  Okay for discharge home today. ? ?PERTINENT LABS: ? ?The results of significant diagnostics from this hospitalization (including imaging, microbiology, ancillary and laboratory) are listed below for reference.   ? ?Microbiology: ?Recent Results (from the past 240 hour(s))  ?Resp Panel by RT-PCR (Flu A&B, Covid) Urine, Clean Catch     Status: None  ? Collection Time: 10/02/21  1:11 AM  ? Specimen: Urine, Clean Catch; Nasopharyngeal(NP) swabs in vial transport medium  ?Result Value Ref Range Status  ? SARS Coronavirus 2 by RT PCR NEGATIVE NEGATIVE Final  ?  Comment: (NOTE) ?SARS-CoV-2 target nucleic acids are NOT DETECTED. ? ?The SARS-CoV-2 RNA is generally detectable in upper respiratory ?specimens during the acute phase of infection. The lowest ?concentration of SARS-CoV-2 viral copies this assay  can detect is ?138 copies/mL. A negative result does not preclude SARS-Cov-2 ?infection and should not be used as the sole basis for treatment or ?other patient management decisions. A negative result may occur with   ?improper specimen collection/handling, submission of specimen other ?than nasopharyngeal swab, presence of viral mutation(s) within the ?areas targeted by this assay, and inadequate number of viral ?copies(<138 copies/mL). A negative result must be combined with ?clinical observations, patient history, and epidemiological ?information. The expected result is Negative. ? ?Fact Sheet for Patients:  ?EntrepreneurPulse.com.au ? ?Fact Sheet for Healthcare Providers:  ?IncredibleEmployment.be ? ?This test is no t yet approved or cleared by the [place] FDA and  ?has been authorized for detection and/or diagnosis of SARS-CoV-2 by ?FDA under an Emergency Use Authorization (EUA). This EUA will remain  ?in effect (meaning this test can be used) for the duration of the ?COVID-19 declaration under Section 564(b)(1) of the Act, 21 ?U.S.C.section 360bbb-3(b)(1), unless the authorization is terminated  ?or revoked sooner.  ? ? ?  ? Influenza A by PCR NEGATIVE NEGATIVE Final  ? Influenza B by PCR NEGATIVE NEGATIVE Final  ?  Comment: (NOTE) ?The Xpert Xpress SARS-CoV-2/FLU/RSV plus assay is intended as an aid ?in the diagnosis of influenza from Nasopharyngeal swab specimens and ?should not be used as a sole basis for treatment. Nasal washings and ?aspirates are unacceptable for Xpert Xpress SARS-CoV-2/FLU/RSV ?testing. ? ?Fact Sheet for Patients: ?EntrepreneurPulse.com.au ? ?Fact Sheet for Healthcare Providers: ?IncredibleEmployment.be ? ?This test is not yet approved or cleared by the [place] FDA and ?has been authorized for detection and/or diagnosis of SARS-CoV-2 by ?FDA under an Emergency Use Authorization (EUA). This EUA will remain ?in effect (meaning this test can be used) for the duration of the ?COVID-19 declaration under Section 564(b)(1) of the Act, 21 U.S.C. ?section 360bbb-3(b)(1), unless the authorization is terminated  or ?revoked. ? ?Performed at [facility], [place], ?[place] [address] ?  ?  ? ?Labs: ? ?COVID-19 Labs ? ? ?Lab Results  ?Component Value Date  ? [place] NEGATIVE 10/02/2021  ? ? ? ? ?Basic Metabolic Panel: ?Recent Labs  ?Lab 10/02/21 ?0000  ?NA 134*  ?K 4.0  ?CL 99  ?CO2 26  ?GLUCOSE 97  ?BUN 17  ?CREATININE 1.23  ?CALCIUM 9.3  ? ? ?CBC: ?Recent Labs  ?Lab 10/02/21 ?0000  ?WBC 11.6*  ?HGB 17.4*  ?HCT 52.4*  ?MCV 86.8  ?PLT 213  ? ? ? ?IMAGING STUDIES ?CT HEAD WO CONTRAST (5MM) ? ?Result Date: 10/02/2021 ?CLINICAL DATA:  Numbness or tingling.  Paresthesia. EXAM: CT HEAD WITHOUT CONTRAST TECHNIQUE: Contiguous axial images were obtained from the base of the skull through the vertex without intravenous contrast. RADIATION DOSE REDUCTION: This exam was performed according to the departmental dose-optimization program which includes automated exposure control, adjustment of the mA and/or kV according to patient size and/or use of iterative reconstruction technique. COMPARISON:  None. FINDINGS: Brain: The ventricles and sulci are appropriate size for the patient's age. The gray-white matter discrimination is preserved. There is no acute intracranial hemorrhage. No mass effect or midline shift. No extra-axial fluid collection. Vascular: No hyperdense vessel or unexpected calcification. Skull: Normal. Negative for fracture or focal lesion. Sinuses/Orbits: The visualized paranasal sinuses and mastoid air cells are clear. There is a 3 cm left maxillary sinus retention cyst or polyp. Other: Small calcified focus over the left parietal scalp, likely sequela of prior insult. IMPRESSION: No acute intracranial pathology. Electronically Signed   By:  Anner Crete M.D.   On: 10/02/2021 00:24  ? ?MR BRAIN WO CONTRAST ? ?Result Date: 10/02/2021 ?CLINICAL DATA:  TIA EXAM: MRI HEAD WITHOUT CONTRAST TECHNIQUE: Multiplanar, multiecho pulse sequences of the brain and surrounding structures were obtained  without intravenous contrast. COMPARISON:  11/27/2017 FINDINGS: Brain: No restricted diffusion to suggest acute or subacute infarct. No acute hemorrhage, mass, mass effect, or midline shift. No hydrocephalus or extra-axial collection.

## 2021-10-03 ENCOUNTER — Other Ambulatory Visit: Payer: Self-pay | Admitting: Cardiovascular Disease

## 2021-10-03 LAB — HEMOGLOBIN A1C
Hgb A1c MFr Bld: 5.4 % (ref 4.8–5.6)
Mean Plasma Glucose: 108 mg/dL

## 2021-10-07 NOTE — Progress Notes (Signed)
?Cardiology Office Note:   ? ?Date:  10/10/2021  ? ?ID:  Ronnie Mcintyre, DOB Jul 06, 1966, MRN [id number] ? ?PCP:  Pcp, No  ?[facility] Cardiologist:  [name], MD  ?[facility] Electrophysiologist:  None  ? ?Referring MD: No ref. provider found  ? ?Chief Complaint: 6 month f/u/Hospital follow-up ? ?History of Present Illness:   ? ?Ronnie Mcintyre is a 56 y.o. male with a hx of cardiomyopathy due to chemotherapy, large B-cell lymphoma, obesity, HTN, HLD who presents for 6 month follow-up.  ? ?He was diagnosed with B-cell lymphoma in June 2019 and received chemotherapy. Echo before that showed LVEF 55-60% with no WMA and mildly dilated aortic root at 463mHG. Repeat echo after third cycle of chemotherapy showed EF 50-55% with severe HK of the mid to distal anteroseptal anterior and apical myocardium. Treadmill Myoview in October 2019 showed small apical defect due to apical thinning although a small infarct could not be excluded. It was overall low risk with an EF of 50%. Repeat echo 11/2020 showed LVEF 40-45%. [facility]  ? ?He was admitted 3/14-3/15 for left sided numbness, suspected TIA. BP was mildly elevated. CT of the head was negative for acute process. Labs showed mild leukocytosis. EKG without ischemic changes. Echo showed LVEF 45-50%, mild LVH, G1DD mild dilation of the aortic root.  Carotid dopplers showed no significant stenosis. Neurology was consulted, suspected TIA. Aspirin was changed to Plavix. ? ?Today, the patient reports he can still have numbness and tinging after sitting for a long time,which sounds more like nerve pain/sciatica. .Marland KitchenHe will see a neurologist in the middle of May. HE reports som elevated Bps, however he checks BP before taking his medications. BP at home 130s/90. No chest pain,  shortness of breath, LLE, orthopnea, pnd.  ? ?Past Medical History:  ?Diagnosis Date  ? [place]  ? Non-Hogkins lumphoma  ? Hypertension   ? [place]   ? ? ?Past Surgical History:  ?Procedure Laterality Date  ? ANKLE SURGERY Right   ? PORTACATH PLACEMENT Right 02/11/2018  ? Procedure: INSERTION PORT-A-CATH;  Surgeon: PJules Husbands MD;  Location: [facility] ORS;  Service: General;  Laterality: Right;  ? ? ?Current Medications: ?Current Meds  ?Medication Sig  ? acetaminophen (TYLENOL) 325 MG tablet Take 2 tablets (650 mg total) by mouth every 6 (six) hours as needed for mild pain (or Fever >/= 101).  ? carvedilol (COREG) 12.5 MG tablet TAKE 1 TABLET (12.5MG TOTAL) BY MOUTH TWICE A DAY WITH MEALS  ? clopidogrel (PLAVIX) 75 MG tablet Take 1 tablet (75 mg total) by mouth daily.  ? empagliflozin (JARDIANCE) 10 MG TABS tablet Take 1 tablet (10 mg total) by mouth daily before breakfast.  ? ezetimibe (ZETIA) 10 MG tablet Take 1 tablet (10 mg total) by mouth daily.  ? rosuvastatin (CRESTOR) 40 MG tablet Take 1 tablet (40 mg total) by mouth daily.  ? sacubitril-valsartan (ENTRESTO) 49-51 MG Take 1 tablet by mouth 2 (two) times daily.  ? spironolactone (ALDACTONE) 25 MG tablet TAKE 1/2 TABLET BY MOUTH EVERY DAY  ?  ? ?Allergies:   Patient has no known allergies.  ? ?Social History  ? ?Socioeconomic History  ? Marital status: Single  ?  Spouse name: Not on file  ? Number of children: Not on file  ? Years of education: Not on file  ? Highest education level: Not on file  ?Occupational History  ? Not on file  ?  Tobacco Use  ? Smoking status: Never  ? Smokeless tobacco: Never  ?Vaping Use  ? Vaping Use: Never used  ?Substance and Sexual Activity  ? Alcohol use: Yes  ?  Comment: rarely  ? Drug use: No  ? Sexual activity: Not Currently  ?Other Topics Concern  ? Not on file  ?Social History Narrative  ? Not on file  ? ?Social Determinants of Health  ? ?Financial Resource Strain: Not on file  ?Food Insecurity: Not on file  ?Transportation Needs: Not on file  ?Physical Activity: Not on file  ?Stress: Not on file  ?Social Connections: Not on file  ?  ? ?Family History: ?The  patient's family history includes Hypertension in his father and mother; Multiple myeloma in his father. ? ?ROS:   ?Please see the history of present illness.    ? All other systems reviewed and are negative. ? ?EKGs/Labs/Other Studies Reviewed:   ? ?The following studies were reviewed today: ? ?Echo 09/2021 ? 1. Left ventricular ejection fraction, by estimation, is 45 to 50%. The  ?left ventricle has mildly decreased function. The left ventricle  ?demonstrates global hypokinesis. There is mild left ventricular  ?hypertrophy. Left ventricular diastolic parameters  ?are consistent with Grade I diastolic dysfunction (impaired relaxation).  ? 2. Right ventricular systolic function is low normal. The right  ?ventricular size is not well visualized.  ? 3. The mitral valve is normal in structure. No evidence of mitral valve  ?regurgitation.  ? 4. The aortic valve was not well visualized. Aortic valve regurgitation  ?is mild.  ? 5. Aortic dilatation noted. There is mild dilatation of the aortic root,  ?measuring 41 mm.  ? ?Echo 11/2020 ?1. Left ventricular ejection fraction, by estimation, is 40 to 45%. The  ?left ventricle has mildly decreased function. The left ventricle  ?demonstrates global hypokinesis. Left ventricular diastolic parameters are  ?consistent with Grade I diastolic  ?dysfunction (impaired relaxation).  ? 2. Right ventricular systolic function was not well visualized, thoough  ?appears grossly intact. The right ventricular size is normal.  ? 3. Left atrial size was mildly dilated.  ? 4. There is mild dilatation of the ascending aorta, measuring 42 mm.  ?Aortic root 3.5 cm, Aortic arch 2.8 cm  ? ?Echo 10/2019 ? 1. Left ventricular ejection fraction, by estimation, is 40 to 45%. The  ?left ventricle has mild to moderately decreased function. The left  ?ventricle demonstrates global hypokinesis. There is mild left ventricular  ?hypertrophy. Left ventricular diastolic  ?parameters are consistent with Grade I  diastolic dysfunction (impaired  ?relaxation).  ? 2. Right ventricular systolic function is normal. The right ventricular  ?size is normal.  ? 3. The mitral valve is normal in structure. No evidence of mitral valve  ?regurgitation.  ? 4. The aortic valve is normal in structure. Aortic valve regurgitation is  ?not visualized.  ? 5. Aortic dilatation noted. There is mild dilatation of the aortic root  ?and of the ascending aorta measuring 42 mm.  ? ?Echo 04/2018 ?Study Conclusions  ? ?- Left ventricle: The cavity size was normal. There was mild  ?  concentric hypertrophy. Systolic function was normal. The  ?  estimated ejection fraction was in the range of 50% to 55%.  ?  Possible mild hypokinesis of the mid-apicalanteroseptal  ?  myocardium. Left ventricular diastolic function parameters were  ?  normal.  ?- Aorta: Aortic root dimension: 43 mm (ED).  ? ?Impressions:  ? ?- No significant change since  most recent echo.  ? ? ?EKG:  EKG is  ordered today.  The ekg ordered today demonstrates NSR, 87bpm, LAFB, LAD, poor R wave progression, no significant ST/T wave changes ? ?Recent Labs: ?06/17/2021: ALT 30 ?10/02/2021: BUN 17; Creatinine, Ser 1.23; Hemoglobin 17.4; Platelets 213; Potassium 4.0; Sodium 134  ?Recent Lipid Panel ?   ?Component Value Date/Time  ? CHOL 154 10/02/2021 0307  ? TRIG 152 (H) 10/02/2021 0600  ? HDL 35 (L) 10/02/2021 0307  ? CHOLHDL 4.4 10/02/2021 0307  ? VLDL 30 10/02/2021 0307  ? [place] 89 10/02/2021 0307  ? ? ? ?Physical Exam:   ? ?VS:  BP 110/74 (BP Location: Left Arm, Patient Position: Sitting, Cuff Size: Normal)   Pulse 87   Ht 6' (1.829 m)   Wt 268 lb (121.6 kg)   SpO2 98%   BMI 36.35 kg/m?    ? ?Wt Readings from Last 3 Encounters:  ?10/10/21 268 lb (121.6 kg)  ?10/01/21 260 lb (117.9 kg)  ?06/17/21 268 lb (121.6 kg)  ?  ? ?GEN:  Well nourished, well developed in no acute distress ?HEENT: Normal ?NECK: No JVD; No carotid bruits ?LYMPHATICS: No lymphadenopathy ?CARDIAC: RRR, no murmurs,  rubs, gallops ?RESPIRATORY:  Clear to auscultation without rales, wheezing or rhonchi  ?ABDOMEN: Soft, non-tender, non-distended ?MUSCULOSKELETAL:  No edema; No deformity  ?SKIN: Warm and dry ?NEUROLOGIC:  Alert and o

## 2021-10-10 ENCOUNTER — Other Ambulatory Visit: Payer: Self-pay

## 2021-10-10 ENCOUNTER — Ambulatory Visit: Payer: Commercial Managed Care - PPO | Admitting: Medical

## 2021-10-10 ENCOUNTER — Encounter: Payer: Self-pay | Admitting: Medical

## 2021-10-10 VITALS — BP 110/74 | HR 87 | Ht 72.0 in | Wt 268.0 lb

## 2021-10-10 DIAGNOSIS — I427 Cardiomyopathy due to drug and external agent: Secondary | ICD-10-CM | POA: Diagnosis not present

## 2021-10-10 DIAGNOSIS — I2584 Coronary atherosclerosis due to calcified coronary lesion: Secondary | ICD-10-CM

## 2021-10-10 DIAGNOSIS — G459 Transient cerebral ischemic attack, unspecified: Secondary | ICD-10-CM

## 2021-10-10 DIAGNOSIS — E785 Hyperlipidemia, unspecified: Secondary | ICD-10-CM | POA: Diagnosis not present

## 2021-10-10 DIAGNOSIS — I10 Essential (primary) hypertension: Secondary | ICD-10-CM

## 2021-10-10 DIAGNOSIS — I5022 Chronic systolic (congestive) heart failure: Secondary | ICD-10-CM

## 2021-10-10 DIAGNOSIS — I77810 Thoracic aortic ectasia: Secondary | ICD-10-CM

## 2021-10-10 DIAGNOSIS — I2510 Atherosclerotic heart disease of native coronary artery without angina pectoris: Secondary | ICD-10-CM

## 2021-10-10 DIAGNOSIS — T451X5D Adverse effect of antineoplastic and immunosuppressive drugs, subsequent encounter: Secondary | ICD-10-CM

## 2021-10-10 MED ORDER — EMPAGLIFLOZIN 10 MG PO TABS: 10.0000 mg | ORAL_TABLET | Freq: Every day | ORAL | 3 refills | Status: DC

## 2021-10-10 NOTE — Patient Instructions (Signed)
Medication Instructions:  ?Your physician has recommended you make the following change in your medication:  ? ?START taking empagliflozin (Jardiance) 10 mg daily  ? ?*If you need a refill on your cardiac medications before your next appointment, please call your pharmacy* ? ? ?Lab Work: ?Your physician recommends that you return for lab work (BMET) in: 1 week ? ? ? ?Please return to our office on_____________________at______________am/pm  ? ?If you have labs (blood work) drawn today and your tests are completely normal, you will receive your results only by: ?MyChart Message (if you have MyChart) OR ?A paper copy in the mail ?If you have any lab test that is abnormal or we need to change your treatment, we will call you to review the results. ? ? ?Testing/Procedures: ?None ordered ? ? ?Follow-Up: ?At [facility], you and your health needs are our priority.  As part of our continuing mission to provide you with exceptional heart care, we have created designated Provider Care Teams.  These Care Teams include your primary Cardiologist (physician) and Advanced Practice Providers (APPs -  Physician Assistants and Nurse Practitioners) who all work together to provide you with the care you need, when you need it. ? ?We recommend signing up for the patient portal called "MyChart".  Sign up information is provided on this After Visit Summary.  MyChart is used to connect with patients for Virtual Visits (Telemedicine).  Patients are able to view lab/test results, encounter notes, upcoming appointments, etc.  Non-urgent messages can be sent to your provider as well.   ?To learn more about what you can do with MyChart, go to [email or web address].   ? ?Your next appointment:   ?6 month(s) ? ?The format for your next appointment:   ?In Person ? ?Provider:   ?You may see [name], MD or one of the following Advanced Practice Providers on your designated Care Team:   ?Murray Hodgkins, NP ?Christell Faith, PA-C ?Cadence  Kathlen Mody, PA-C ? ? ?Other Instructions ?N/A ?

## 2021-10-17 ENCOUNTER — Other Ambulatory Visit: Payer: Commercial Managed Care - PPO

## 2021-10-17 ENCOUNTER — Other Ambulatory Visit: Payer: Self-pay

## 2021-10-17 ENCOUNTER — Other Ambulatory Visit
Admission: RE | Admit: 2021-10-17 | Discharge: 2021-10-17 | Disposition: A | Discharge status: Home / Self Care | Payer: Commercial Managed Care - PPO | Attending: Medical | Admitting: Medical

## 2021-10-17 DIAGNOSIS — T451X5A Adverse effect of antineoplastic and immunosuppressive drugs, initial encounter: Secondary | ICD-10-CM | POA: Insufficient documentation

## 2021-10-17 DIAGNOSIS — I427 Cardiomyopathy due to drug and external agent: Secondary | ICD-10-CM | POA: Insufficient documentation

## 2021-10-17 LAB — BASIC METABOLIC PANEL
Anion gap: 7 (ref 5–15)
BUN: 11 mg/dL (ref 6–20)
CO2: 26 mmol/L (ref 22–32)
Calcium: 8.7 mg/dL — ABNORMAL LOW (ref 8.9–10.3)
Chloride: 101 mmol/L (ref 98–111)
Creatinine, Ser: 1.04 mg/dL (ref 0.61–1.24)
GFR, Estimated: >60 mL/min (ref >60)
Glucose, Bld: 92 mg/dL (ref 70–99)
Potassium: 3.9 mmol/L (ref 3.5–5.1)
Sodium: 134 mmol/L — ABNORMAL LOW (ref 135–145)

## 2021-10-17 MED ORDER — CARVEDILOL 12.5 MG PO TABS
ORAL_TABLET | ORAL | 1 refills | Status: DC
Start: 2021-10-17 — End: 2022-04-15

## 2021-10-19 ENCOUNTER — Other Ambulatory Visit (HOSPITAL_BASED_OUTPATIENT_CLINIC_OR_DEPARTMENT_OTHER): Payer: Self-pay | Admitting: Family

## 2021-11-25 ENCOUNTER — Encounter: Payer: Self-pay | Admitting: Diagnostic Neuroimaging

## 2021-11-25 ENCOUNTER — Ambulatory Visit: Payer: Commercial Managed Care - PPO | Admitting: Diagnostic Neuroimaging

## 2021-11-25 VITALS — BP 116/82 | HR 76 | Ht 72.0 in | Wt 267.0 lb

## 2021-11-25 DIAGNOSIS — R200 Anesthesia of skin: Secondary | ICD-10-CM

## 2021-11-25 NOTE — Progress Notes (Signed)
? ?[facility] ? ?PATIENT: Ronnie Mcintyre ?DOB: [birth year] ? ?REFERRING CLINICIAN: Bonnielee Haff, MD ?HISTORY FROM: patient  ?REASON FOR VISIT: new consult ? ? ?HISTORICAL ? ?CHIEF COMPLAINT:  ?Chief Complaint  ?Patient presents with  ? New Patient (Initial Visit)  ?  RM 7 alone here for hospital follow up. Pt reports on 10/01/21 he had tingling in the left leg with elevated BP. Hospital thought questionable for TIA. History of sciatica ( on the left side), some h/a have been noted since this event.   ? ? ?HISTORY OF PRESENT ILLNESS:  ? ?56 year old male here for evaluation of possible TIA.  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 ? ?10/01/2021 patient was at home, had been sitting for a long time when he noticed some tingling to his left leg.  This reminded him of some chronic left sciatica numbness and tingling has had over the years, especially with prolonged sitting.  However this time symptoms were little bit more intensified.  He went to take a shower and then came out.  He felt somewhat sweaty and checked his blood pressure which is noted to be 160/100.  This caused him to start to panic and he felt some chest tightness and left arm tingling sensation.  This caused further anxiety.  Ultimately he went to the emergency room to get evaluation out of concern of possible heart related issue.  Once he was evaluated he was also evaluated for possible TIA due to left arm and left leg symptoms.  After testing and a few hours symptoms gradually subsided.  All testing was negative.  Patient returned to baseline. ? ?REVIEW OF SYSTEMS: Full 14 system review of systems performed and negative with exception of: As per HPI. ? ?ALLERGIES: ?No Known Allergies ? ?HOME MEDICATIONS: ?Outpatient Medications Prior to Visit  ?Medication Sig Dispense Refill  ? acetaminophen (TYLENOL) 325 MG tablet Take 2  tablets (650 mg total) by mouth every 6 (six) hours as needed for mild pain (or Fever >/= 101).    ? carvedilol (COREG) 12.5 MG tablet TAKE 1 TABLET (12.5MG TOTAL) BY MOUTH TWICE A DAY WITH MEALS 180 tablet 1  ? clopidogrel (PLAVIX) 75 MG tablet Take 1 tablet (75 mg total) by mouth daily. 30 tablet 2  ? ezetimibe (ZETIA) 10 MG tablet Take 1 tablet (10 mg total) by mouth daily. 30 tablet 2  ? rosuvastatin (CRESTOR) 40 MG tablet TAKE 1 TABLET BY MOUTH EVERY DAY 90 tablet 1  ? sacubitril-valsartan (ENTRESTO) 49-51 MG Take 1 tablet by mouth 2 (two) times daily. 60 tablet 11  ? spironolactone (ALDACTONE) 25 MG tablet TAKE 1/2 TABLET BY MOUTH EVERY DAY 45 tablet 1  ? empagliflozin (JARDIANCE) 10 MG TABS tablet Take 1 tablet (10 mg total) by mouth daily before breakfast. (Patient not taking: Reported on 11/25/2021) 90 tablet 3  ? ?No facility-administered medications prior to visit.  ? ? ?PAST MEDICAL HISTORY: ?Past Medical History:  ?Diagnosis Date  ? Cancer ([place]) 12/2017  ? Non-Hogkins lumphoma  ? Hypertension   ? Non Hodgkin's lymphoma ([place])   ? TIA (transient ischemic attack)   ? ? ?PAST SURGICAL HISTORY: ?Past Surgical History:  ?Procedure Laterality Date  ? ANKLE SURGERY Right   ? PORTACATH PLACEMENT Right 02/11/2018  ? Procedure: INSERTION PORT-A-CATH;  Surgeon: Jules Husbands, MD;  Location: [facility] ORS;  Service: General;  Laterality: Right;  ? ? ?FAMILY HISTORY: ?Family History  ?  Problem Relation Age of Onset  ? Hypertension Mother   ? Hypertension Father   ? Multiple myeloma Father   ? Transient ischemic attack Father   ? ? ?SOCIAL HISTORY: ?Social History  ? ?Socioeconomic History  ? Marital status: Single  ?  Spouse name: Not on file  ? Number of children: Not on file  ? Years of education: Not on file  ? Highest education level: Not on file  ?Occupational History  ? Not on file  ?Tobacco Use  ? Smoking status: Never  ? Smokeless tobacco: Never  ?Vaping Use  ? Vaping Use: Never used  ?Substance and Sexual Activity   ? Alcohol use: Yes  ?  Comment: rarely  ? Drug use: No  ? Sexual activity: Not Currently  ?Other Topics Concern  ? Not on file  ?Social History Narrative  ? Right handed  ? Caffeine oz per day   ? ?Social Determinants of Health  ? ?Financial Resource Strain: Not on file  ?Food Insecurity: Not on file  ?Transportation Needs: Not on file  ?Physical Activity: Not on file  ?Stress: Not on file  ?Social Connections: Not on file  ?Intimate Partner Violence: Not on file  ? ? ? ?PHYSICAL EXAM ? ?GENERAL EXAM/CONSTITUTIONAL: ?Vitals:  ?Vitals:  ? 11/25/21 1459  ?BP: 116/82  ?Pulse: 76  ?SpO2: 96%  ?Weight: 267 lb (121.1 kg)  ?Height: 6' (1.829 m)  ? ?Body mass index is 36.21 kg/m?. ?Wt Readings from Last 3 Encounters:  ?11/25/21 267 lb (121.1 kg)  ?10/10/21 268 lb (121.6 kg)  ?10/01/21 260 lb (117.9 kg)  ? ?Patient is in no distress; well developed, nourished and groomed; neck is supple ? ?CARDIOVASCULAR: ?Examination of carotid arteries is normal; no carotid bruits ?Regular rate and rhythm, no murmurs ?Examination of peripheral vascular system by observation and palpation is normal ? ?EYES: ?Ophthalmoscopic exam of optic discs and posterior segments is normal; no papilledema or hemorrhages ?No results found. ? ?MUSCULOSKELETAL: ?Gait, strength, tone, movements noted in Neurologic exam below ? ?NEUROLOGIC: ?MENTAL STATUS:  ?   ? View : No data to display.  ?  ?  ?  ? ?awake, alert, oriented to person, place and time ?recent and remote memory intact ?normal attention and concentration ?language fluent, comprehension intact, naming intact ?fund of knowledge appropriate ? ?CRANIAL NERVE:  ?2nd - no papilledema on fundoscopic exam ?2nd, 3rd, 4th, 6th - pupils equal and reactive to light, visual fields full to confrontation, extraocular muscles intact, no nystagmus ?5th - facial sensation symmetric ?7th - facial strength symmetric ?8th - hearing intact ?9th - palate elevates symmetrically, uvula midline ?11th - shoulder shrug  symmetric ?12th - tongue protrusion midline ? ?MOTOR:  ?normal bulk and tone, full strength in the BUE, BLE ? ?SENSORY:  ?normal and symmetric to light touch, temperature, vibration ? ?COORDINATION:  ?finger-nose-finger, fine finger movements normal ? ?REFLEXES:  ?deep tendon reflexes TRACE and symmetric ? ?GAIT/STATION:  ?narrow based gait ? ? ? ? ?DIAGNOSTIC DATA (LABS, IMAGING, TESTING) ?- I reviewed patient records, labs, notes, testing and imaging myself where available. ? ?Lab Results  ?Component Value Date  ? WBC 11.6 (H) 10/02/2021  ? HGB 17.4 (H) 10/02/2021  ? HCT 52.4 (H) 10/02/2021  ? MCV 86.8 10/02/2021  ? PLT 213 10/02/2021  ? ?   ?Component Value Date/Time  ? NA 134 (L) 10/17/2021 1525  ? K 3.9 10/17/2021 1525  ? CL 101 10/17/2021 1525  ? CO2 26 10/17/2021 1525  ?  GLUCOSE 92 10/17/2021 1525  ? BUN 11 10/17/2021 1525  ? CREATININE 1.04 10/17/2021 1525  ? CALCIUM 8.7 (L) 10/17/2021 1525  ? PROT 7.1 06/17/2021 1249  ? ALBUMIN 3.7 06/17/2021 1249  ? AST 24 06/17/2021 1249  ? ALT 30 06/17/2021 1249  ? ALKPHOS 59 06/17/2021 1249  ? BILITOT 1.2 06/17/2021 1249  ? GFRNONAA >60 10/17/2021 1525  ? GFRAA >60 04/05/2020 0913  ? ?Lab Results  ?Component Value Date  ? CHOL 154 10/02/2021  ? HDL 35 (L) 10/02/2021  ? [place] 89 10/02/2021  ? TRIG 152 (H) 10/02/2021  ? CHOLHDL 4.4 10/02/2021  ? ?Lab Results  ?Component Value Date  ? HGBA1C 5.4 10/02/2021  ? ?No results found for: VITAMINB12 ?Lab Results  ?Component Value Date  ? TSH 0.864 08/26/2018  ? ? ?10/02/21 MRI brain ?No acute intracranial process. ? ? ?10/02/21 TTE ? 1. Left ventricular ejection fraction, by estimation, is 45 to 50%. The  ?left ventricle has mildly decreased function. The left ventricle  ?demonstrates global hypokinesis. There is mild left ventricular  ?hypertrophy. Left ventricular diastolic parameters  ?are consistent with Grade I diastolic dysfunction (impaired relaxation).  ? 2. Right ventricular systolic function is low normal. The right   ?ventricular size is not well visualized.  ? 3. The mitral valve is normal in structure. No evidence of mitral valve  ?regurgitation.  ? 4. The aortic valve was not well visualized. Aortic valve regurgitation

## 2021-11-25 NOTE — Patient Instructions (Signed)
?  LEFT LEG NUMBNESS ?- likely related to underlying chronic lumbar radiculopathy / sciatica issues ?- recommend conservative mgmt for now; stretching, exercises, PT etc ? ?CHEST TIGHTNESS, SWEATING, LEFT ARM TINGLING ?- cardiac and neurologic workup negative; unlikely TIA ?- continue medical mgmt ?

## 2021-12-02 ENCOUNTER — Inpatient Hospital Stay: Payer: Commercial Managed Care - PPO | Admitting: Neurology

## 2021-12-17 ENCOUNTER — Inpatient Hospital Stay (HOSPITAL_BASED_OUTPATIENT_CLINIC_OR_DEPARTMENT_OTHER): Payer: Commercial Managed Care - PPO | Admitting: Hospice and Palliative Medicine

## 2021-12-17 ENCOUNTER — Inpatient Hospital Stay: Payer: Commercial Managed Care - PPO | Attending: Internal Medicine

## 2021-12-17 VITALS — BP 122/87 | HR 78 | Temp 96.4°F | Resp 16 | Wt 265.0 lb

## 2021-12-17 DIAGNOSIS — C8330 Diffuse large B-cell lymphoma, unspecified site: Secondary | ICD-10-CM | POA: Insufficient documentation

## 2021-12-17 DIAGNOSIS — I10 Essential (primary) hypertension: Secondary | ICD-10-CM | POA: Insufficient documentation

## 2021-12-17 DIAGNOSIS — Z79899 Other long term (current) drug therapy: Secondary | ICD-10-CM | POA: Diagnosis not present

## 2021-12-17 DIAGNOSIS — Z8673 Personal history of transient ischemic attack (TIA), and cerebral infarction without residual deficits: Secondary | ICD-10-CM | POA: Insufficient documentation

## 2021-12-17 DIAGNOSIS — Z08 Encounter for follow-up examination after completed treatment for malignant neoplasm: Secondary | ICD-10-CM | POA: Diagnosis not present

## 2021-12-17 DIAGNOSIS — Z8579 Personal history of other malignant neoplasms of lymphoid, hematopoietic and related tissues: Secondary | ICD-10-CM

## 2021-12-17 DIAGNOSIS — C8510 Unspecified B-cell lymphoma, unspecified site: Secondary | ICD-10-CM

## 2021-12-17 LAB — COMPREHENSIVE METABOLIC PANEL
ALT: 30 U/L (ref 0–44)
AST: 24 U/L (ref 15–41)
Albumin: 3.8 g/dL (ref 3.5–5.0)
Alkaline Phosphatase: 50 U/L (ref 38–126)
Anion gap: 5 (ref 5–15)
BUN: 10 mg/dL (ref 6–20)
CO2: 25 mmol/L (ref 22–32)
Calcium: 8.8 mg/dL — ABNORMAL LOW (ref 8.9–10.3)
Chloride: 108 mmol/L (ref 98–111)
Creatinine, Ser: 0.99 mg/dL (ref 0.61–1.24)
GFR, Estimated: >60 mL/min (ref >60)
Glucose, Bld: 102 mg/dL — ABNORMAL HIGH (ref 70–99)
Potassium: 4.1 mmol/L (ref 3.5–5.1)
Sodium: 138 mmol/L (ref 135–145)
Total Bilirubin: 1.1 mg/dL (ref 0.3–1.2)
Total Protein: 7.1 g/dL (ref 6.5–8.1)

## 2021-12-17 LAB — CBC WITH DIFFERENTIAL/PLATELET
Abs Immature Granulocytes: 0.02 10*3/uL (ref 0.00–0.07)
Basophils Absolute: 0.1 10*3/uL (ref 0.0–0.1)
Basophils Relative: 1 %
Eosinophils Absolute: 0.2 10*3/uL (ref 0.0–0.5)
Eosinophils Relative: 2 %
HCT: 49.2 % (ref 39.0–52.0)
Hemoglobin: 16.8 g/dL (ref 13.0–17.0)
Immature Granulocytes: 0 %
Lymphocytes Relative: 15 %
Lymphs Abs: 1.1 10*3/uL (ref 0.7–4.0)
MCH: 29.2 pg (ref 26.0–34.0)
MCHC: 34.1 g/dL (ref 30.0–36.0)
MCV: 85.6 fL (ref 80.0–100.0)
Monocytes Absolute: 0.6 10*3/uL (ref 0.1–1.0)
Monocytes Relative: 7 %
Neutro Abs: 5.9 10*3/uL (ref 1.7–7.7)
Neutrophils Relative %: 75 %
Platelets: 190 10*3/uL (ref 150–400)
RBC: 5.75 MIL/uL (ref 4.22–5.81)
RDW: 14.3 % (ref 11.5–15.5)
WBC: 7.8 10*3/uL (ref 4.0–10.5)
nRBC: 0 % (ref 0.0–0.2)

## 2021-12-17 LAB — LACTATE DEHYDROGENASE: LDH: 129 U/L (ref 98–192)

## 2021-12-17 NOTE — Progress Notes (Signed)
Hematology/Oncology Consult note [facility]  [phone] [phone]  Patient Care Team: Ronnie Aus, MD as PCP - General (Internal Medicine) Wellington Hampshire, MD as PCP - Cardiology (Cardiology) Sindy Guadeloupe, MD as Consulting Physician (Hematology and Oncology)   Name of the patient: Ronnie Mcintyre  [id number]  [birth year]   Date of visit: 12/17/21  Diagnosis- history of large B-cell lymphoma 2019 currently in CR 1  Chief complaint/ Reason for visit-routine follow-up of diffuse large B-cell lymphoma  Heme/Onc history: Ronnie Mcintyre is a 56 y.o. 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  [facility] pathology revealed necrotic tissue.  [facility] consultation revealed neoplastic cells are positive for CD20, BCL-6, MUM-1, and BCL-2. The neoplastic cells were negative for CD30, CD3, , CD10, c-MYC, EBV ISH, AE1/3, and PAX-8. Ki67 is 60-70%. 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 The absence of CD30 expression makes primary mediastinal large B-cell lymphoma less likely. FISH for MYC rearrangement was negative. IPI score, age adjusted IPI, are low (1) and NCCN IPI score is low-intermediate (2).   He presented with upper chest fullness with radiation to his neck and arm.  He has had B symptoms (sweats and weight loss) worrisome for lymphoma.  LDH is 273.  Uric acid was normal.  Beta-HCG and AFP were normal on 01/16/2018.  On   Bone marrow biopsy on 02/03/2018 revealed a slightly hypercellular bone marrow for age with trilineage hematopoiesis.  There was no monoclonal B-cell population or abnormal T-cell phenotype identified.  Cytogenetics were normal (46, XY).   He received 6 cycles of RCHOP chemotherapy (02/16/2018 - 06/10/2018) with Neulasta support.  He received radiation (3600 cGy in 18 fractions) from 07/27/2018 - 08/19/2018.   Patient has had  surveillance scans up until November 2021 Which showed small residual MR for soft tissue density in the mediastinum measuring 2.4 cm x 1.2 cm with no significant change over the last 1 year.    Interval history-patient reports he is doing well.  He denies any significant changes or concerns or symptomatic complaints today.  He does tell me about his hospitalization in March with concern for TIA but says that he has essentially returned to baseline.  He does endorse occasional left leg tingling stemming from his back and plans to speak with his PCP about this.  He says that this has occurred since his hospitalization in March.  ECOG PS- 1 Pain scale- 0   Review of systems- Review of Systems  Constitutional:  Negative for chills, fever, malaise/fatigue and weight loss.  HENT:  Negative for congestion, ear discharge and nosebleeds.   Eyes:  Negative for blurred vision.  Respiratory:  Negative for cough, hemoptysis, sputum production, shortness of breath and wheezing.   Cardiovascular:  Negative for chest pain, palpitations, orthopnea, claudication and leg swelling.  Gastrointestinal:  Negative for abdominal pain, blood in stool, constipation, diarrhea, heartburn, melena, nausea and vomiting.  Genitourinary:  Negative for dysuria, flank pain, frequency, hematuria and urgency.  Musculoskeletal:  Negative for back pain, joint pain and myalgias.  Skin:  Negative for rash.  Neurological:  Negative for dizziness, tingling, focal weakness, seizures, weakness and headaches.  Endo/Heme/Allergies:  Does not bruise/bleed easily.  Psychiatric/Behavioral:  Negative for depression and suicidal ideas. The patient does not have insomnia.      No Known Allergies   Past Medical History:  Diagnosis Date  .  Cancer ([place]) 12/2017   Non-Hogkins lumphoma  . Hypertension   . Non Hodgkin's lymphoma ([place])   . TIA (transient ischemic attack)      Past Surgical History:  Procedure Laterality Date  . ANKLE  SURGERY Right   . PORTACATH PLACEMENT Right 02/11/2018   Procedure: INSERTION PORT-A-CATH;  Surgeon: Jules Husbands, MD;  Location: [facility] ORS;  Service: General;  Laterality: Right;    Social History   Socioeconomic History  . Marital status: Single    Spouse name: Not on file  . Number of children: Not on file  . Years of education: Not on file  . Highest education level: Not on file  Occupational History  . Not on file  Tobacco Use  . Smoking status: Never  . Smokeless tobacco: Never  Vaping Use  . Vaping Use: Never used  Substance and Sexual Activity  . Alcohol use: Yes    Comment: rarely  . Drug use: No  . Sexual activity: Not Currently  Other Topics Concern  . Not on file  Social History Narrative   Right handed   Caffeine oz per day    Social Determinants of Health   Financial Resource Strain: Not on file  Food Insecurity: Not on file  Transportation Needs: Not on file  Physical Activity: Not on file  Stress: Not on file  Social Connections: Not on file  Intimate Partner Violence: Not on file    Family History  Problem Relation Age of Onset  . Hypertension Mother   . Hypertension Father   . Multiple myeloma Father   . Transient ischemic attack Father      Current Outpatient Medications:  .  acetaminophen (TYLENOL) 325 MG tablet, Take 2 tablets (650 mg total) by mouth every 6 (six) hours as needed for mild pain (or Fever >/= 101)., Disp: , Rfl:  .  carvedilol (COREG) 12.5 MG tablet, TAKE 1 TABLET (12.5MG TOTAL) BY MOUTH TWICE A DAY WITH MEALS, Disp: 180 tablet, Rfl: 1 .  clopidogrel (PLAVIX) 75 MG tablet, Take 1 tablet (75 mg total) by mouth daily., Disp: 30 tablet, Rfl: 2 .  ezetimibe (ZETIA) 10 MG tablet, Take 1 tablet (10 mg total) by mouth daily., Disp: 30 tablet, Rfl: 2 .  rosuvastatin (CRESTOR) 40 MG tablet, TAKE 1 TABLET BY MOUTH EVERY DAY, Disp: 90 tablet, Rfl: 1 .  sacubitril-valsartan (ENTRESTO) 49-51 MG, Take 1 tablet by mouth 2 (two) times  daily., Disp: 60 tablet, Rfl: 11 .  spironolactone (ALDACTONE) 25 MG tablet, TAKE 1/2 TABLET BY MOUTH EVERY DAY, Disp: 45 tablet, Rfl: 1  Physical exam:  Vitals:   12/17/21 1430  BP: 122/87  Pulse: 78  Resp: 16  Temp: (!) 96.4 F (35.8 C)  TempSrc: Tympanic  SpO2: 99%  Weight: 265 lb (120.2 kg)   Physical Exam Constitutional:      General: He is not in acute distress.    Appearance: Normal appearance.  Cardiovascular:     Rate and Rhythm: Normal rate and regular rhythm.     Heart sounds: Normal heart sounds.  Pulmonary:     Effort: Pulmonary effort is normal.     Breath sounds: Normal breath sounds.  Abdominal:     General: Bowel sounds are normal. There is no distension.     Palpations: Abdomen is soft. There is no mass.     Tenderness: There is no abdominal tenderness.  Musculoskeletal:        General: No swelling or tenderness.  Cervical back: Normal range of motion and neck supple. No tenderness.  Lymphadenopathy:     Cervical: No cervical adenopathy.     Comments: No palpable cervical, supraclavicular, axillary or inguinal adenopathy    Skin:    General: Skin is warm and dry.  Neurological:     General: No focal deficit present.     Mental Status: He is alert and oriented to person, place, and time.        Latest Ref Rng & Units 12/17/2021    1:47 PM  CMP  Glucose 70 - 99 mg/dL 102    BUN 6 - 20 mg/dL 10    Creatinine 0.61 - 1.24 mg/dL 0.99    Sodium 135 - 145 mmol/L 138    Potassium 3.5 - 5.1 mmol/L 4.1    Chloride 98 - 111 mmol/L 108    CO2 22 - 32 mmol/L 25    Calcium 8.9 - 10.3 mg/dL 8.8    Total Protein 6.5 - 8.1 g/dL 7.1    Total Bilirubin 0.3 - 1.2 mg/dL 1.1    Alkaline Phos 38 - 126 U/L 50    AST 15 - 41 U/L 24    ALT 0 - 44 U/L 30        Latest Ref Rng & Units 12/17/2021    1:47 PM  CBC  WBC 4.0 - 10.5 K/uL 7.8    Hemoglobin 13.0 - 17.0 g/dL 16.8    Hematocrit 39.0 - 52.0 % 49.2    Platelets 150 - 400 K/uL 190      Assessment and  plan- Patient is a 56 y.o. male with history of stage Ib bulky large B-cell lymphoma diagnosed in 2019 s/p 6 cycles of R-CHOP followed by involved field radiation currently in CR 1.  He is here for routine follow-up.  Patient is doing well without concerning signs or symptoms of recurrence.  Labs including CBC, CMP, and LDH are grossly normal today.  Last CT of the chest, abdomen, pelvis on 06/19/2020 was stable and without findings of progressive or recurrent disease. He does not require any surveillance imaging for his DLBCL at this time.   Case and plan discussed with Dr. Janese Banks.   Patient will RTC in 6 months for labs/MD visit   Visit Diagnosis 1. Encounter for follow-up surveillance of diffuse large B-cell lymphoma      Altha Harm, PhD, NP-C [facility] [phone] 12/17/2021 3:05 PM

## 2021-12-17 NOTE — Progress Notes (Unsigned)
Returns for follow-up. No concerns today. States no changes to his health, other than starting a new medication for blood pressure.

## 2021-12-23 ENCOUNTER — Other Ambulatory Visit: Payer: Self-pay | Admitting: Family

## 2021-12-23 NOTE — Telephone Encounter (Signed)
Pt of Dr. Fletcher Anon. Please review for refills. Thank you!

## 2022-03-10 ENCOUNTER — Other Ambulatory Visit: Payer: Self-pay | Admitting: Family

## 2022-03-10 NOTE — Telephone Encounter (Signed)
Pt of Dr. Fletcher Anon. Please review for refill. Thank you!

## 2022-03-24 ENCOUNTER — Emergency Department: Payer: Commercial Managed Care - PPO

## 2022-03-24 ENCOUNTER — Other Ambulatory Visit: Payer: Self-pay

## 2022-03-24 ENCOUNTER — Emergency Department
Admission: EM | Admit: 2022-03-24 | Discharge: 2022-03-24 | Disposition: A | Discharge status: Home / Self Care | Payer: Commercial Managed Care - PPO | Attending: Student in an Organized Health Care Education/Training Program | Admitting: Student in an Organized Health Care Education/Training Program

## 2022-03-24 ENCOUNTER — Encounter: Payer: Self-pay | Admitting: Emergency Medicine

## 2022-03-24 DIAGNOSIS — D72829 Elevated white blood cell count, unspecified: Secondary | ICD-10-CM | POA: Diagnosis not present

## 2022-03-24 DIAGNOSIS — R079 Chest pain, unspecified: Secondary | ICD-10-CM

## 2022-03-24 DIAGNOSIS — Z8572 Personal history of non-Hodgkin lymphomas: Secondary | ICD-10-CM | POA: Diagnosis not present

## 2022-03-24 DIAGNOSIS — R072 Precordial pain: Secondary | ICD-10-CM | POA: Diagnosis not present

## 2022-03-24 LAB — BASIC METABOLIC PANEL
Anion gap: 7 (ref 5–15)
BUN: 14 mg/dL (ref 6–20)
CO2: 28 mmol/L (ref 22–32)
Calcium: 9 mg/dL (ref 8.9–10.3)
Chloride: 102 mmol/L (ref 98–111)
Creatinine, Ser: 1.15 mg/dL (ref 0.61–1.24)
GFR, Estimated: >60 mL/min (ref >60)
Glucose, Bld: 96 mg/dL (ref 70–99)
Potassium: 4 mmol/L (ref 3.5–5.1)
Sodium: 137 mmol/L (ref 135–145)

## 2022-03-24 LAB — CBC
HCT: 48.6 % (ref 39.0–52.0)
Hemoglobin: 16.2 g/dL (ref 13.0–17.0)
MCH: 29.2 pg (ref 26.0–34.0)
MCHC: 33.3 g/dL (ref 30.0–36.0)
MCV: 87.6 fL (ref 80.0–100.0)
Platelets: 189 10*3/uL (ref 150–400)
RBC: 5.55 MIL/uL (ref 4.22–5.81)
RDW: 14.5 % (ref 11.5–15.5)
WBC: 13.2 10*3/uL — ABNORMAL HIGH (ref 4.0–10.5)
nRBC: 0 % (ref 0.0–0.2)

## 2022-03-24 LAB — TROPONIN I (HIGH SENSITIVITY)
Troponin I (High Sensitivity): 5 ng/L (ref <18)
Troponin I (High Sensitivity): 7 ng/L (ref <18)

## 2022-03-24 LAB — D-DIMER, QUANTITATIVE: D-Dimer, Quant: 1.06 ug/mL-FEU — ABNORMAL HIGH (ref 0.00–0.50)

## 2022-03-24 MED ORDER — IOHEXOL 350 MG/ML SOLN
55.0000 mL | Freq: Once | INTRAVENOUS | Status: AC | PRN
  Administered 2022-03-24: 55 mL via INTRAVENOUS

## 2022-03-24 NOTE — ED Triage Notes (Signed)
Pt here with cp that started 3 days ago. Pt states the pain starts in the center of his chest and radiate up to his neck and arm. Pt states the pain is intermittent but it is getting stronger. Pt in no distress in triage.

## 2022-03-24 NOTE — ED Provider Notes (Signed)
[facility] Provider Note    Event Date/Time   First MD Initiated Contact with Patient 03/24/22 1533     (approximate)   History   Chest Pain   HPI  Ronnie Mcintyre is a 56 y.o. male with a history of large B cell lymphoma not currently on chemotherapy presents to the ER for 3 days of intermittent episodes of initially midsternal chest pain rating to left shoulder and up into his neck.  Does hurt worse with deep inspiration.  Denies any shortness of breath.  Episodes are brief in nature lasting only a few minutes.  Denies any diaphoresis.  No nausea or vomiting no abdominal pain.     Physical Exam   Triage Vital Signs: ED Triage Vitals  Enc Vitals Group     BP 03/24/22 1335 (!) 127/93     Pulse Rate 03/24/22 1335 95     Resp 03/24/22 1335 18     Temp 03/24/22 1335 97.7 F (36.5 C)     Temp Source 03/24/22 1335 Oral     SpO2 03/24/22 1335 100 %     Weight 03/24/22 1333 264 lb 15.9 oz (120.2 kg)     Height 03/24/22 1333 6' (1.829 m)     Head Circumference --      Peak Flow --      Pain Score 03/24/22 1333 5     Pain Loc --      Pain Edu? --      Excl. in [place]? --     Most recent vital signs: Vitals:   03/24/22 1550 03/24/22 1752  BP: 120/89 122/80  Pulse: 90 88  Resp: 18 18  Temp:  98 F (36.7 C)  SpO2: 100% 100%     Constitutional: Alert  Eyes: Conjunctivae are normal.  Head: Atraumatic. Nose: No congestion/rhinnorhea. Mouth/Throat: Mucous membranes are moist.   Neck: Painless ROM.  Cardiovascular:   Good peripheral circulation. Respiratory: Normal respiratory effort.  No retractions.  Gastrointestinal: Soft and nontender.  Musculoskeletal:  no deformity Neurologic:  MAE spontaneously. No gross focal neurologic deficits are appreciated.  Skin:  Skin is warm, dry and intact. No rash noted. Psychiatric: Mood and affect are normal. Speech and behavior are normal.    ED Results / Procedures / Treatments   Labs (all labs  ordered are listed, but only abnormal results are displayed) Labs Reviewed  CBC - Abnormal; Notable for the following components:      Result Value   WBC 13.2 (*)    All other components within normal limits  D-DIMER, QUANTITATIVE - Abnormal; Notable for the following components:   D-Dimer, Quant 1.06 (*)    All other components within normal limits  BASIC METABOLIC PANEL  TROPONIN I (HIGH SENSITIVITY)  TROPONIN I (HIGH SENSITIVITY)     EKG  ED ECG REPORT I, Merlyn Lot, the attending physician, personally viewed and interpreted this ECG.   Date: 03/24/2022  EKG Time: 13:31  Rate: 90  Rhythm: sinus  Axis: left  Intervals:normal qt  ST&T Change: no stemi, no depressions    RADIOLOGY Please see ED Course for my review and interpretation.  I personally reviewed all radiographic images ordered to evaluate for the above acute complaints and reviewed radiology reports and findings.  These findings were personally discussed with the patient.  Please see medical record for radiology report.    PROCEDURES:  Critical Care performed:   Procedures   MEDICATIONS ORDERED IN ED: Medications  iohexol (  OMNIPAQUE) 350 MG/ML injection 55 mL (55 mLs Intravenous Contrast Given 03/24/22 1747)     IMPRESSION / MDM / ASSESSMENT AND PLAN / ED COURSE  I reviewed the triage vital signs and the nursing notes.                              Differential diagnosis includes, but is not limited to, ACS, pericarditis, esophagitis, pe, dissection, pna, bronchitis, costochondritis  Patient presented to the ER for evaluation of symptoms as described above.  This presenting complaint could reflect a potentially life-threatening illness therefore the patient will be placed on continuous pulse oximetry and telemetry for monitoring.  Laboratory evaluation will be sent to evaluate for the above complaints.  Patient's EKG is nonischemic.  Initial troponin is negative.  Does have chronic leukocytosis.   He is not hypoxic not tachycardic.  Otherwise low risk by Wells criteria will order D-dimer to further restratify for PE.  Does not seem clinically consistent with dissection.  Chest x-ray ordered.  Abdominal exam soft and benign.    Clinical Course as of 03/24/22 1810  Mon Mar 24, 2022  1533 Chest x-ray on my review and interpretation does not show any evidence of ptx or effusion. [PR]  1656 D-dimer is elevated will order CTA to rule out PE. [PR]  1808 Patient reassessed.  CTA is reassuring does have known ascending aortic aneurysm.  Patient aware of this.  Discussed recommendation for annual follow-up and imaging.  Given his asymptomatic state well appearance with reassuring work-up I do believe he is stable and appropriate for outpatient follow-up with his pcp and cardiologist. [PR]    Clinical Course User Index [PR] Merlyn Lot, MD    FINAL CLINICAL IMPRESSION(S) / ED DIAGNOSES   Final diagnoses:  Chest pain, unspecified type     Rx / DC Orders   ED Discharge Orders     None        Note:  This document was prepared using Dragon voice recognition software and may include unintentional dictation errors.    Merlyn Lot, MD 03/24/22 1810

## 2022-03-24 NOTE — ED Notes (Signed)
See triage note  Presents with some intermittent chest pain for the past 2-3 days   States pain in worse with inspiration  No fever  or cough

## 2022-03-27 ENCOUNTER — Other Ambulatory Visit: Payer: Self-pay | Admitting: *Deleted

## 2022-03-27 ENCOUNTER — Encounter: Payer: Self-pay | Admitting: Cardiovascular Disease

## 2022-03-27 MED ORDER — ENTRESTO 49-51 MG PO TABS: 1.0000 | ORAL_TABLET | Freq: Two times a day (BID) | ORAL | 0 refills | Status: DC

## 2022-04-15 ENCOUNTER — Encounter: Payer: Self-pay | Admitting: Cardiovascular Disease

## 2022-04-15 ENCOUNTER — Ambulatory Visit: Payer: Commercial Managed Care - PPO | Attending: Cardiovascular Disease | Admitting: Cardiovascular Disease

## 2022-04-15 VITALS — BP 120/90 | HR 74 | Ht 72.0 in | Wt 268.5 lb

## 2022-04-15 DIAGNOSIS — I427 Cardiomyopathy due to drug and external agent: Secondary | ICD-10-CM

## 2022-04-15 DIAGNOSIS — I2510 Atherosclerotic heart disease of native coronary artery without angina pectoris: Secondary | ICD-10-CM | POA: Diagnosis not present

## 2022-04-15 DIAGNOSIS — R072 Precordial pain: Secondary | ICD-10-CM

## 2022-04-15 DIAGNOSIS — E785 Hyperlipidemia, unspecified: Secondary | ICD-10-CM | POA: Diagnosis not present

## 2022-04-15 DIAGNOSIS — I10 Essential (primary) hypertension: Secondary | ICD-10-CM

## 2022-04-15 DIAGNOSIS — T451X5A Adverse effect of antineoplastic and immunosuppressive drugs, initial encounter: Secondary | ICD-10-CM

## 2022-04-15 DIAGNOSIS — I2584 Coronary atherosclerosis due to calcified coronary lesion: Secondary | ICD-10-CM | POA: Diagnosis not present

## 2022-04-15 DIAGNOSIS — T451X5D Adverse effect of antineoplastic and immunosuppressive drugs, subsequent encounter: Secondary | ICD-10-CM

## 2022-04-15 DIAGNOSIS — R0602 Shortness of breath: Secondary | ICD-10-CM

## 2022-04-15 DIAGNOSIS — I77810 Thoracic aortic ectasia: Secondary | ICD-10-CM

## 2022-04-15 MED ORDER — EZETIMIBE 10 MG PO TABS: 10.0000 mg | ORAL_TABLET | Freq: Every day | ORAL | 2 refills | Status: DC

## 2022-04-15 MED ORDER — CLOPIDOGREL BISULFATE 75 MG PO TABS: 75.0000 mg | ORAL_TABLET | Freq: Every day | ORAL | 2 refills | Status: DC

## 2022-04-15 MED ORDER — ASPIRIN 81 MG PO TBEC: 81.0000 mg | DELAYED_RELEASE_TABLET | Freq: Every day | ORAL | Status: AC

## 2022-04-15 MED ORDER — CARVEDILOL 12.5 MG PO TABS: ORAL_TABLET | ORAL | 2 refills | Status: DC

## 2022-04-15 NOTE — Patient Instructions (Addendum)
Medication Instructions:  Your physician has recommended you make the following change in your medication:   START Aspirin 81 mg daily. This can be purchased over the counter.  *If you need a refill on your cardiac medications before your next appointment, please call your pharmacy*   Lab Work: None ordered If you have labs (blood work) drawn today and your tests are completely normal, you will receive your results only by: [place] (if you have MyChart) OR A paper copy in the mail If you have any lab test that is abnormal or we need to change your treatment, we will call you to review the results.   Testing/Procedures: Your physician has requested that you have cardiac CT. Cardiac computed tomography (CT) is a painless test that uses an x-ray machine to take clear, detailed pictures of your heart. For further information please visit [email or web address].[email or web address]. Please follow instruction sheet as given.     Follow-Up: At [facility], you and your health needs are our priority.  As part of our continuing mission to provide you with exceptional heart care, we have created designated Provider Care Teams.  These Care Teams include your primary Cardiologist (physician) and Advanced Practice Providers (APPs -  Physician Assistants and Nurse Practitioners) who all work together to provide you with the care you need, when you need it.  We recommend signing up for the patient portal called "MyChart".  Sign up information is provided on this After Visit Summary.  MyChart is used to connect with patients for Virtual Visits (Telemedicine).  Patients are able to view lab/test results, encounter notes, upcoming appointments, etc.  Non-urgent messages can be sent to your provider as well.   To learn more about what you can do with MyChart, go to [email or web address].    Your next appointment:   6 month(s)  The format for your next appointment:   In Person  Provider:   You may see  [name], MD or one of the following Advanced Practice Providers on your designated Care Team:   Murray Hodgkins, NP Christell Faith, PA-C Cadence Kathlen Mody, PA-C Gerrie Nordmann, NP    Other Instructions Your physician has requested that you have cardiac CT. Cardiac computed tomography (CT) is a painless test that uses an x-ray machine to take clear, detailed pictures of your heart.    Your cardiac CT will be scheduled at:  [facility] [address] [place], [place] [address] (336) [facility], [place] [address] [phone]  Please arrive 15 mins early for check-in and test prep.    Please follow these instructions carefully (unless otherwise directed):   Hold all erectile dysfunction medications at least 3 days (72 hrs) prior to test. (Ie viagra, cialis, sildenafil, tadalafil, etc) We will administer nitroglycerin during this exam.    Night Before the Test: Be sure to Drink plenty of water. Do not consume any caffeinated/decaffeinated beverages or chocolate 12 hours prior to your test.  On the Day of the Test: Drink plenty of water until 1 hour prior to the test. Do not eat any food 4 hours prior to the test. You may take your regular medications prior to the test.  Take Carvedilol two hours prior to test.       After the Test: Drink plenty of water. After receiving IV contrast, you may experience a mild flushed feeling. This is normal. On occasion, you may experience a  mild rash up to 24 hours after the test. This is not dangerous. If this occurs, you can take Benadryl 25 mg and increase your fluid intake. If you experience trouble breathing, this can be serious. If it is severe call 911 IMMEDIATELY. If it is mild, please call our office. If you take any of these medications: Glipizide/Metformin, Avandament, Glucavance, please do not take 48 hours after  completing test unless otherwise instructed.  Please allow 2-4 weeks for scheduling of routine cardiac CTs. Some insurance companies require a pre-authorization which may delay scheduling of this test.   For non-scheduling related questions, please contact the cardiac imaging nurse navigator should you have any questions/concerns: Marchia Bond, Cardiac Imaging Nurse Navigator Gordy Clement, Cardiac Imaging Nurse Navigator [facility] Direct Office Dial: [phone]   For scheduling needs, including cancellations and rescheduling, please call [place], [phone].    Important Information About Sugar

## 2022-04-15 NOTE — Progress Notes (Signed)
Cardiology Office Note   Date:  04/15/2022   ID:  Ronnie Mcintyre, DOB 1965/07/29, MRN [id number]  PCP:  Rusty Aus, MD  Cardiologist:   [name], MD   Chief Complaint  Patient presents with   Other    6 month f/u c/o occassional chest pain and bedtime. Pt d/c Plavix and zetia. Meds reviewed verbally with pt.       History of Present Illness: Ronnie Mcintyre is a 56 y.o. 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 The patient has known history of obesity, hypertension and hyperlipidemia.  He has no prior cardiac history.  He is not a smoker or diabetic.  He has no family history of coronary artery disease. 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  He had an echocardiogram done in June 2019 before chemotherapy which showed an EF of 55 to 60% with no wall motion abnormalities and mildly dilated aortic root at 40 mmHg.  He had a repeat echocardiogram after third cycle of chemotherapy which showed an EF of 50 to 55% with probable severe hypokinesis of the mid to distal anteroseptal, anterior and apical myocardium.   Treadmill Myoview in October of 2019 was suboptimal overall but there was a small apical defect likely due to to apical thinning although a small infarct could not be excluded.  Nonetheless, it was a low risk study with EF of 50%. He had extensive radiation therapy that was complicated by cough and shortness of breath that gradually improved.  He had repeated echocardiograms and his ejection fraction has been stable at 40 to 45% most recently in May of this year.   He was hospitalized in March with left-sided numbness and suspected TIA.  Echo showed an EF of 45 to 50% with mildly dilated aortic root.  Carotid Doppler showed no significant stenosis. He was subsequently seen by an outpatient neurology and it was felt that his symptoms were not due to TIA.  He went to the  emergency room recently with atypical chest pain.  Troponin was normal but D-dimer was mildly elevated.  CTA of the chest showed no evidence of pulmonary embolism or aortic dissection.  The ascending aorta measured 4.3 cm.    He reports intermittent substernal chest tightness both at rest and with exertion.  In addition, he has significant exertional dyspnea.  He had this intermittent chest pain for the last few months now.   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     carvedilol (COREG) 12.5 MG tablet TAKE 1 TABLET (12.5MG TOTAL) BY MOUTH TWICE A DAY WITH MEALS 180 tablet 1   rosuvastatin (CRESTOR) 40 MG tablet TAKE 1 TABLET BY MOUTH EVERY DAY 90 tablet 1   sacubitril-valsartan (ENTRESTO) 49-51 MG Take 1 tablet by mouth  2 (two) times daily. 180 tablet 0   spironolactone (ALDACTONE) 25 MG tablet TAKE 1/2 TABLET BY MOUTH EVERY DAY 45 tablet 0   clopidogrel (PLAVIX) 75 MG tablet Take 1 tablet (75 mg total) by mouth daily. (Patient not taking: Reported on 04/15/2022) 30 tablet 2   ezetimibe (ZETIA) 10 MG tablet Take 1 tablet (10 mg total) by mouth daily. (Patient not taking: Reported on 04/15/2022) 30 tablet 2   No current facility-administered medications for this visit.    Allergies:   Patient has no known allergies.    Social History:  The patient  reports that he has never smoked. He has never used smokeless  tobacco. He reports current alcohol use. He reports that he does not use drugs.   Family History:  The patient's family history includes Hypertension in his father and mother; Multiple myeloma in his father; Transient ischemic attack in his father.    ROS:  Please see the history of present illness.   Otherwise, review of systems are positive for none.   All other systems are reviewed and negative.    PHYSICAL EXAM: VS:  BP (!) 120/90 (BP Location: Left Arm, Patient Position: Sitting, Cuff Size: Large)   Pulse 74   Ht 6' (1.829 m)   Wt 268 lb 8 oz (121.8 kg)   SpO2 98%   BMI 36.42 kg/m  , BMI Body mass index is 36.42 kg/m. GEN: Well nourished, well developed, in no acute distress  HEENT: normal  Neck: no JVD, carotid bruits, or masses Cardiac: RRR; no murmurs, rubs, or gallops,no edema  Respiratory:  clear to auscultation bilaterally, normal work of breathing GI: soft, nontender, nondistended, + BS MS: no deformity or atrophy  Skin: warm and dry, no rash Neuro:  Strength and sensation are intact Psych: euthymic mood, full affect   EKG:  EKG is ordered today. EKG shows normal sinus rhythm with left axis deviation.  Anteroseptal and inferior Q waves suggestive of prior infarct.  Recent Labs: 12/17/2021: ALT 30 03/24/2022: BUN 14; Creatinine, Ser 1.15; Hemoglobin 16.2; Platelets 189; Potassium 4.0; Sodium 137    Lipid Panel    Component Value Date/Time   CHOL 154 10/02/2021 0307   TRIG 152 (H) 10/02/2021 0307   HDL 35 (L) 10/02/2021 0307   CHOLHDL 4.4 10/02/2021 0307   VLDL 30 10/02/2021 0307   LDLCALC 89 10/02/2021 0307      Wt Readings from Last 3 Encounters:  04/15/22 268 lb 8 oz (121.8 kg)  03/24/22 264 lb 15.9 oz (120.2 kg)  12/17/21 265 lb (120.2 kg)         04/22/2018    3:32 PM  PAD Screen  Previous PAD dx? No  Previous surgical procedure? No  Pain with walking? No  Feet/toe relief with dangling? No  Painful, non-healing ulcers? No  Extremities  discolored? No      ASSESSMENT AND PLAN:  1.  Cardiomyopathy likely due to previous chemotherapy with most recent ejection fraction of 45-50%: His ejection fraction has been stable overall.  No evidence of volume overload.  Continue same medications including carvedilol, spironolactone and Entresto.    2. Recurrent chest pain and exertional dyspnea: His symptoms are worrisome for angina and he has multiple risk factors.  In addition, he is known to have coronary calcifications on previous imaging.  His baseline EKG is abnormal with evidence of prior anterior and inferior infarcts.  Thus, a treadmill stress test is not sufficient.  Recommend evaluation with cardiac CTA.  I asked him to start taking aspirin 81 mg once daily.  3.   Essential hypertension: Blood pressure is well controlled.    4. Hyperlipidemia: I reviewed most recent lipid profile done in April which showed an LDL of 53.   5.  Mildly dilated ascending aorta at 43 mm.  Recommend annual surveillance.   Disposition:   FU with me in 6 months.  Signed,  [name], MD  04/15/2022 4:18 PM    [facility]

## 2022-04-20 ENCOUNTER — Other Ambulatory Visit (HOSPITAL_BASED_OUTPATIENT_CLINIC_OR_DEPARTMENT_OTHER): Payer: Self-pay | Admitting: Cardiovascular Disease

## 2022-04-23 ENCOUNTER — Telehealth (HOSPITAL_COMMUNITY): Payer: Self-pay | Admitting: Emergency Medicine

## 2022-04-23 NOTE — Telephone Encounter (Signed)
Reaching out to patient to offer assistance regarding upcoming cardiac imaging study; pt verbalizes understanding of appt date/time, parking situation and where to check in, pre-test NPO status and medications ordered, and verified current allergies; name and call back number provided for further questions should they arise Marchia Bond RN Navigator Cardiac Imaging [facility] [phone] office [phone] cell  Arrival [facility] Carvedilol 2 hr prior Aware nitro Some difficulty with iv

## 2022-04-23 NOTE — Telephone Encounter (Signed)
Attempted to call patient regarding upcoming cardiac CT appointment. °Left message on voicemail with name and callback number °Yeny Schmoll RN Navigator Cardiac Imaging °[facility] °[phone] Office °[phone] Cell ° °

## 2022-04-24 ENCOUNTER — Other Ambulatory Visit: Payer: Self-pay | Admitting: Cardiovascular Disease

## 2022-04-24 ENCOUNTER — Ambulatory Visit
Admission: RE | Admit: 2022-04-24 | Discharge: 2022-04-24 | Disposition: A | Discharge status: Home / Self Care | Payer: Commercial Managed Care - PPO | Source: Ambulatory Visit | Attending: Cardiovascular Disease | Admitting: Cardiovascular Disease

## 2022-04-24 DIAGNOSIS — R931 Abnormal findings on diagnostic imaging of heart and coronary circulation: Secondary | ICD-10-CM | POA: Insufficient documentation

## 2022-04-24 DIAGNOSIS — R072 Precordial pain: Secondary | ICD-10-CM | POA: Insufficient documentation

## 2022-04-24 DIAGNOSIS — R0602 Shortness of breath: Secondary | ICD-10-CM | POA: Insufficient documentation

## 2022-04-24 MED ORDER — METOPROLOL TARTRATE 5 MG/5ML IV SOLN
10.0000 mg | Freq: Once | INTRAVENOUS | Status: AC
  Administered 2022-04-24: 10 mg via INTRAVENOUS
  Filled 2022-04-24: qty 10

## 2022-04-24 MED ORDER — METOPROLOL TARTRATE 5 MG/5ML IV SOLN
10.0000 mg | Freq: Once | INTRAVENOUS | Status: AC
Start: 2022-04-24 — End: 2022-04-24

## 2022-04-24 MED ORDER — NITROGLYCERIN 0.4 MG SL SUBL
SUBLINGUAL_TABLET | SUBLINGUAL | Status: AC
  Administered 2022-04-24: 0.8 mg via SUBLINGUAL
  Filled 2022-04-24: qty 2

## 2022-04-24 MED ORDER — METOPROLOL TARTRATE 5 MG/5ML IV SOLN
INTRAVENOUS | Status: AC
  Administered 2022-04-24: 10 mg via INTRAVENOUS
  Filled 2022-04-24: qty 20

## 2022-04-24 MED ORDER — IOHEXOL 350 MG/ML SOLN
100.0000 mL | Freq: Once | INTRAVENOUS | Status: AC | PRN
  Administered 2022-04-24: 100 mL via INTRAVENOUS

## 2022-04-24 MED ORDER — NITROGLYCERIN 0.4 MG SL SUBL: 0.8000 mg | SUBLINGUAL_TABLET | Freq: Once | SUBLINGUAL | Status: AC

## 2022-04-25 ENCOUNTER — Telehealth: Payer: Self-pay

## 2022-04-25 NOTE — Telephone Encounter (Signed)
-----   Message from Wellington Hampshire, MD sent at 04/24/2022  5:03 PM EDT ----- Abnormal cardiac CTA with significant disease in the coronary arteries.  Schedule an urgent follow-up appointment with APP to set up cardiac catheterization.

## 2022-04-25 NOTE — Telephone Encounter (Signed)
Patient made aware of Cardiac CTA results and Dr. Tyrell Antonio recommendation. Appt scheduled on 04/28/22 @ 8:25am with Gerrie Nordmann, NP. Patient given ED precautions. Patient verbalized understanding and voiced appreciation for the call.

## 2022-04-27 NOTE — H&P (View-Only) (Signed)
Cardiology Clinic Note   Patient Name: [name] Date of Encounter: 04/28/2022  Primary Care Provider:  Rusty Aus, MD Primary Cardiologist:  [name], MD  Patient Profile    56 year old male with a history of cardiomyopathy due to chemotherapy, large B-cell lymphoma, obesity, hypertension, hyperlipidemia, who presents today for follow-up.  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  He was diagnosed with B-cell lymphoma in June 2019 and received chemotherapy.  Echocardiogram before that revealed LVEF 55-60% with no wall motion abnormality, and mildly dilated aortic root at 40 mm.  Repeat echocardiogram after third cycle of chemotherapy showed LVEF of 58-55% with severe hypokinesis of the mid to distal anterior septal anterior and apical myocardium.  Treadmill Myoview in October 2019 revealed small apical defect due to apical thinning although small infarct cannot be excluded.  It was an overall low risk study with an EF of 50%.  Repeat echocardiogram done 11/2020 revealed LVEF 40-45% at which time he was switched to [facility].  3/14-3/15 for left-sided numbness and suspected a TIA.  At that time his blood pressure was mildly elevated.  CT of the head was negative for acute processes.  Labs showed mild leukocytosis.  EKG without ischemic changes.   Echocardiogram revealed LVEF 45-50%, mild LVH, G1 DD, mild dilatation of the aortic root.  Carotid Doppler showed no significant stenosis.  Neurology was consulted and suspected TIA.  Aspirin was then changed to Plavix.  He was then evaluated in the emergency department a few months later with atypical chest pain.  Troponin was normal but D-dimer was mildly elevated.  CTA of the chest showed no evidence of pulmonary embolism or aortic dissection.  The ascending aorta measured at 4.3 cm.  He was last seen in clinic 04/15/2022 with complaints of intermittent substernal chest tightness both at rest and with exertion in addition he had associated exertional dyspnea.  He was scheduled for coronary CT at that time.  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  FFR analysis revealed left main no significant stenosis, LAD total occlusion in the proximal segment, left circumflex no significant stenosis FFR CT 0.96, RCA no significant stenosis with FFR 0.94.  He returns to clinic today with continued complaints of chest discomfort that is just reactionary to the left upper chest that occasionally radiates into his neck and left arm.  He also has associated shortness of breath.  This discomfort can happen with rest and exertion.  He has been unable to identify any alleviating or aggravating factors.  Here to discuss his recent coronary CT.  Denies any recent hospitalizations or visits to the emergency department.  Home Medications    Current Outpatient Medications  Medication Sig Dispense Refill   acetaminophen (TYLENOL) 325 MG tablet Take 2  tablets (650 mg total) by mouth every 6 (six) hours as needed for mild pain (or Fever >/= 101).     aspirin EC 81 MG tablet Take 1 tablet (81 mg total) by mouth daily. Swallow whole.     carvedilol (COREG) 12.5 MG tablet TAKE 1 TABLET (12.5MG TOTAL) BY MOUTH TWICE A DAY WITH MEALS 180 tablet 2   ezetimibe (ZETIA) 10 MG tablet Take 1 tablet (10 mg total) by mouth daily. 90 tablet 2   rosuvastatin (CRESTOR) 40 MG tablet Take 1 tablet (40 mg total) by mouth daily. 90 tablet 2   sacubitril-valsartan (ENTRESTO) 49-51 MG Take 1 tablet by mouth 2 (two) times daily. 180 tablet 0   spironolactone (ALDACTONE) 25 MG tablet TAKE 1/2 TABLET BY MOUTH EVERY DAY 45 tablet 0   clopidogrel (PLAVIX) 75 MG tablet Take 1 tablet (75 mg total) by mouth daily. (Patient not taking: Reported on 04/24/2022) 30 tablet 2   No current facility-administered medications for this visit.     Family History    Family History  Problem Relation Age of Onset   Hypertension Mother    Hypertension Father    Multiple myeloma Father    Transient ischemic attack Father    He indicated that his mother is alive. He indicated that his father is deceased.  Social History    Social History   Socioeconomic History   Marital status: Single    Spouse name: Not on file   Number of children: Not on file   Years of education: Not on file   Highest education level: Not on file  Occupational History   Not on file  Tobacco Use   Smoking status: Never   Smokeless tobacco: Never  Vaping Use   Vaping Use: Never used  Substance and Sexual Activity   Alcohol use: Yes    Comment: rarely   Drug use: No   Sexual activity: Not Currently  Other Topics Concern   Not on file  Social History Narrative   Right handed   Caffeine oz per day    Social Determinants of Health   Financial Resource Strain: Not on file  Food Insecurity: Not on file  Transportation Needs: Not on file  Physical Activity: Not on file  Stress: Not on file   Social Connections: Not on file  Intimate Partner Violence: Not on file     Review of Systems    General:  No chills, fever, night sweats or weight changes.  Cardiovascular:  Endorses chest pain, dyspnea on exertion, edema, but denies orthopnea, palpitations, paroxysmal nocturnal dyspnea. Dermatological: No rash, lesions/masses Respiratory: Endorses  cough that is occassionally productive, dyspnea with associated chest discomfort Urologic: No hematuria, dysuria Abdominal:   No nausea, vomiting, diarrhea, bright red blood per rectum, melena, or hematemesis Neurologic:  No visual changes, wkns, changes in mental status. All other systems reviewed and are otherwise negative except as noted above.   Physical Exam    VS:  BP 125/89 (BP Location: Right Arm, Patient Position: Sitting, Cuff Size: Normal)   Pulse 85   Ht 6' (1.829 m)   Wt 265 lb 12.8 oz (120.6 kg)   SpO2 98%   BMI 36.05 kg/m  , BMI Body mass index is 36.05  kg/m.     GEN: Well nourished, well developed, in no acute distress. HEENT: normal. Neck: Supple, no JVD, carotid bruits, or masses. Cardiac: RRR, no murmurs, rubs, or gallops. No clubbing, cyanosis, edema.  Radials/DP/PT 2+ and equal bilaterally.  Respiratory:  Respirations regular and unlabored, clear to auscultation bilaterally. GI: Soft, nontender, nondistended, BS + x 4. MS: no deformity or atrophy. Skin: warm and dry, no rash. Neuro:  Strength and sensation are intact. Psych: Normal affect.  Accessory Clinical Findings    ECG personally reviewed by me today-sinus rhythm below 85 with anterior septal and inferior Q waves- No acute changes  Lab Results  Component Value Date   WBC 13.2 (H) 03/24/2022   HGB 16.2 03/24/2022   HCT 48.6 03/24/2022   MCV 87.6 03/24/2022   PLT 189 03/24/2022   Lab Results  Component Value Date   CREATININE 1.15 03/24/2022   BUN 14 03/24/2022   NA 137 03/24/2022   K 4.0 03/24/2022   CL 102 03/24/2022   CO2 28 03/24/2022    Lab Results  Component Value Date   ALT 30 12/17/2021   AST 24 12/17/2021   ALKPHOS 50 12/17/2021   BILITOT 1.1 12/17/2021   Lab Results  Component Value Date   CHOL 154 10/02/2021   HDL 35 (L) 10/02/2021   LDLCALC 89 10/02/2021   TRIG 152 (H) 10/02/2021   CHOLHDL 4.4 10/02/2021    Lab Results  Component Value Date   HGBA1C 5.4 10/02/2021    Assessment & Plan   1.  Recurrent chest pain with exertional dyspnea.  Symptoms were worrisome for angina with multiple risk factors, and underwent coronary CT. coronary CTA revealed calcium coronary score of 2862 which is 90th percentile for age and sex matched control, normal coronary arteries with right dominance, chronic total occlusion of proximal LAD, moderate mid distal RCA stenosis, mild proximal left circumflex stenosis, moderate stenosis of total coronary occlusion with FFR revealing left main with no significant stenosis, LAD total occlusion of the proximal segment: Left circumflex no significant stenosis with FFR 0.96 and RCA had no significant stenosis with FFR of 0.94.  With abnormal results he has been scheduled for cardiac catheterization with Dr. Fletcher Anon on Monday Oct 16th, risks, benefits, and alternatives have been discussed. He was continued on ASA therapy.  2.  Cardiomyopathy with an LVEF of 45 to 50%.  Likely due to previous chemotherapy.  Ejection fraction has been stable overall.  No evidence of volume overload.  He continues to be euvolemic on exam today.  He has been continued on carvedilol 25 mg twice daily, Entresto 49/51 mg twice daily, and Aldactone 25 mg half a tablet every day.  3.  Essential hypertension with blood pressure today 125/89.  He is continued on his current medication regimen there were no changes, made today. Blood pressure appears to be well controlled.  4.  Hyperlipidemia with most recent LDL 53 in April 2023.  He is not 40 mg daily.  This has been followed by his PCP.  5.  Mildly dilated ascending  aorta at 41 mm on echocardiogram done 10/02/2021.  Recommend annual surveillance.  6.  Patient return to clinic in 1 to 2 weeks post left heart catheterization see MD/APP or sooner if needed.  Shared Decision Making/Informed Consent The risks [stroke (1 in 1000), death (1 in 1000), kidney failure [usually temporary] (1 in 500), bleeding (1 in 200), allergic reaction [possibly serious] (1 in 200)], benefits (diagnostic support and management of  coronary artery disease) and alternatives of a cardiac catheterization were discussed in detail with Mr. Heacock and he is willing to proceed.   Syla Devoss, NP 04/28/2022, 8:48 AM

## 2022-04-27 NOTE — Progress Notes (Unsigned)
Cardiology Clinic Note   Patient Name: Ronnie Mcintyre Date of Encounter: 04/28/2022  Primary Care Provider:  Rusty Aus, MD Primary Cardiologist:  [name], MD  Patient Profile    56 year old male with a history of cardiomyopathy due to chemotherapy, large B-cell lymphoma, obesity, hypertension, hyperlipidemia, who presents today for follow-up.  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  He was diagnosed with B-cell lymphoma in June 2019 and received chemotherapy.  Echocardiogram before that revealed LVEF 55-60% with no wall motion abnormality, and mildly dilated aortic root at 40 mm.  Repeat echocardiogram after third cycle of chemotherapy showed LVEF of 58-55% with severe hypokinesis of the mid to distal anterior septal anterior and apical myocardium.  Treadmill Myoview in October 2019 revealed small apical defect due to apical thinning although small infarct cannot be excluded.  It was an overall low risk study with an EF of 50%.  Repeat echocardiogram done 11/2020 revealed LVEF 40-45% at which time he was switched to [facility].  3/14-3/15 for left-sided numbness and suspected a TIA.  At that time his blood pressure was mildly elevated.  CT of the head was negative for acute processes.  Labs showed mild leukocytosis.  EKG without ischemic changes.   Echocardiogram revealed LVEF 45-50%, mild LVH, G1 DD, mild dilatation of the aortic root.  Carotid Doppler showed no significant stenosis.  Neurology was consulted and suspected TIA.  Aspirin was then changed to Plavix.  He was then evaluated in the emergency department a few months later with atypical chest pain.  Troponin was normal but D-dimer was mildly elevated.  CTA of the chest showed no evidence of pulmonary embolism or aortic dissection.  The ascending aorta measured at 4.3 cm.  He was last seen in clinic 04/15/2022 with complaints of intermittent substernal chest tightness both at rest and with exertion in addition he had associated exertional dyspnea.  He was scheduled for coronary CT at that time.  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  FFR analysis revealed left main no significant stenosis, LAD total occlusion in the proximal segment, left circumflex no significant stenosis FFR CT 0.96, RCA no significant stenosis with FFR 0.94.  He returns to clinic today with continued complaints of chest discomfort that is just reactionary to the left upper chest that occasionally radiates into his neck and left arm.  He also has associated shortness of breath.  This discomfort can happen with rest and exertion.  He has been unable to identify any alleviating or aggravating factors.  Here to discuss his recent coronary CT.  Denies any recent hospitalizations or visits to the emergency department.  Home Medications    Current Outpatient Medications  Medication Sig Dispense Refill   acetaminophen (TYLENOL) 325 MG tablet Take 2  tablets (650 mg total) by mouth every 6 (six) hours as needed for mild pain (or Fever >/= 101).     aspirin EC 81 MG tablet Take 1 tablet (81 mg total) by mouth daily. Swallow whole.     carvedilol (COREG) 12.5 MG tablet TAKE 1 TABLET (12.5MG TOTAL) BY MOUTH TWICE A DAY WITH MEALS 180 tablet 2   ezetimibe (ZETIA) 10 MG tablet Take 1 tablet (10 mg total) by mouth daily. 90 tablet 2   rosuvastatin (CRESTOR) 40 MG tablet Take 1 tablet (40 mg total) by mouth daily. 90 tablet 2   sacubitril-valsartan (ENTRESTO) 49-51 MG Take 1 tablet by mouth 2 (two) times daily. 180 tablet 0   spironolactone (ALDACTONE) 25 MG tablet TAKE 1/2 TABLET BY MOUTH EVERY DAY 45 tablet 0   clopidogrel (PLAVIX) 75 MG tablet Take 1 tablet (75 mg total) by mouth daily. (Patient not taking: Reported on 04/24/2022) 30 tablet 2   No current facility-administered medications for this visit.     Family History    Family History  Problem Relation Age of Onset   Hypertension Mother    Hypertension Father    Multiple myeloma Father    Transient ischemic attack Father    He indicated that his mother is alive. He indicated that his father is deceased.  Social History    Social History   Socioeconomic History   Marital status: Single    Spouse name: Not on file   Number of children: Not on file   Years of education: Not on file   Highest education level: Not on file  Occupational History   Not on file  Tobacco Use   Smoking status: Never   Smokeless tobacco: Never  Vaping Use   Vaping Use: Never used  Substance and Sexual Activity   Alcohol use: Yes    Comment: rarely   Drug use: No   Sexual activity: Not Currently  Other Topics Concern   Not on file  Social History Narrative   Right handed   Caffeine oz per day    Social Determinants of Health   Financial Resource Strain: Not on file  Food Insecurity: Not on file  Transportation Needs: Not on file  Physical Activity: Not on file  Stress: Not on file   Social Connections: Not on file  Intimate Partner Violence: Not on file     Review of Systems    General:  No chills, fever, night sweats or weight changes.  Cardiovascular:  Endorses chest pain, dyspnea on exertion, edema, but denies orthopnea, palpitations, paroxysmal nocturnal dyspnea. Dermatological: No rash, lesions/masses Respiratory: Endorses  cough that is occassionally productive, dyspnea with associated chest discomfort Urologic: No hematuria, dysuria Abdominal:   No nausea, vomiting, diarrhea, bright red blood per rectum, melena, or hematemesis Neurologic:  No visual changes, wkns, changes in mental status. All other systems reviewed and are otherwise negative except as noted above.   Physical Exam    VS:  BP 125/89 (BP Location: Right Arm, Patient Position: Sitting, Cuff Size: Normal)   Pulse 85   Ht 6' (1.829 m)   Wt 265 lb 12.8 oz (120.6 kg)   SpO2 98%   BMI 36.05 kg/m  , BMI Body mass index is 36.05  kg/m.     GEN: Well nourished, well developed, in no acute distress. HEENT: normal. Neck: Supple, no JVD, carotid bruits, or masses. Cardiac: RRR, no murmurs, rubs, or gallops. No clubbing, cyanosis, edema.  Radials/DP/PT 2+ and equal bilaterally.  Respiratory:  Respirations regular and unlabored, clear to auscultation bilaterally. GI: Soft, nontender, nondistended, BS + x 4. MS: no deformity or atrophy. Skin: warm and dry, no rash. Neuro:  Strength and sensation are intact. Psych: Normal affect.  Accessory Clinical Findings    ECG personally reviewed by me today-sinus rhythm below 85 with anterior septal and inferior Q waves- No acute changes  Lab Results  Component Value Date   WBC 13.2 (H) 03/24/2022   HGB 16.2 03/24/2022   HCT 48.6 03/24/2022   MCV 87.6 03/24/2022   PLT 189 03/24/2022   Lab Results  Component Value Date   CREATININE 1.15 03/24/2022   BUN 14 03/24/2022   NA 137 03/24/2022   K 4.0 03/24/2022   CL 102 03/24/2022   CO2 28 03/24/2022    Lab Results  Component Value Date   ALT 30 12/17/2021   AST 24 12/17/2021   ALKPHOS 50 12/17/2021   BILITOT 1.1 12/17/2021   Lab Results  Component Value Date   CHOL 154 10/02/2021   HDL 35 (L) 10/02/2021   LDLCALC 89 10/02/2021   TRIG 152 (H) 10/02/2021   CHOLHDL 4.4 10/02/2021    Lab Results  Component Value Date   HGBA1C 5.4 10/02/2021    Assessment & Plan   1.  Recurrent chest pain with exertional dyspnea.  Symptoms were worrisome for angina with multiple risk factors, and underwent coronary CT. coronary CTA revealed calcium coronary score of 2862 which is 90th percentile for age and sex matched control, normal coronary arteries with right dominance, chronic total occlusion of proximal LAD, moderate mid distal RCA stenosis, mild proximal left circumflex stenosis, moderate stenosis of total coronary occlusion with FFR revealing left main with no significant stenosis, LAD total occlusion of the proximal segment: Left circumflex no significant stenosis with FFR 0.96 and RCA had no significant stenosis with FFR of 0.94.  With abnormal results he has been scheduled for cardiac catheterization with Dr. Fletcher Anon on Monday Oct 16th, risks, benefits, and alternatives have been discussed. He was continued on ASA therapy.  2.  Cardiomyopathy with an LVEF of 45 to 50%.  Likely due to previous chemotherapy.  Ejection fraction has been stable overall.  No evidence of volume overload.  He continues to be euvolemic on exam today.  He has been continued on carvedilol 25 mg twice daily, Entresto 49/51 mg twice daily, and Aldactone 25 mg half a tablet every day.  3.  Essential hypertension with blood pressure today 125/89.  He is continued on his current medication regimen there were no changes, made today. Blood pressure appears to be well controlled.  4.  Hyperlipidemia with most recent LDL 53 in April 2023.  He is not 40 mg daily.  This has been followed by his PCP.  5.  Mildly dilated ascending  aorta at 41 mm on echocardiogram done 10/02/2021.  Recommend annual surveillance.  6.  Patient return to clinic in 1 to 2 weeks post left heart catheterization see MD/APP or sooner if needed.  Shared Decision Making/Informed Consent The risks [stroke (1 in 1000), death (1 in 1000), kidney failure [usually temporary] (1 in 500), bleeding (1 in 200), allergic reaction [possibly serious] (1 in 200)], benefits (diagnostic support and management of  coronary artery disease) and alternatives of a cardiac catheterization were discussed in detail with Mr. Heacock and he is willing to proceed.   Jissell Trafton, NP 04/28/2022, 8:48 AM

## 2022-04-28 ENCOUNTER — Other Ambulatory Visit
Admission: RE | Admit: 2022-04-28 | Discharge: 2022-04-28 | Disposition: A | Discharge status: Home / Self Care | Payer: Commercial Managed Care - PPO | Source: Ambulatory Visit | Attending: Cardiovascular Disease | Admitting: Cardiovascular Disease

## 2022-04-28 ENCOUNTER — Encounter: Payer: Self-pay | Admitting: Cardiology

## 2022-04-28 ENCOUNTER — Ambulatory Visit: Payer: Commercial Managed Care - PPO | Attending: Cardiology | Admitting: Cardiology

## 2022-04-28 VITALS — BP 125/89 | HR 85 | Ht 72.0 in | Wt 265.8 lb

## 2022-04-28 DIAGNOSIS — I5022 Chronic systolic (congestive) heart failure: Secondary | ICD-10-CM

## 2022-04-28 DIAGNOSIS — I2510 Atherosclerotic heart disease of native coronary artery without angina pectoris: Secondary | ICD-10-CM | POA: Diagnosis present

## 2022-04-28 DIAGNOSIS — I427 Cardiomyopathy due to drug and external agent: Secondary | ICD-10-CM | POA: Diagnosis not present

## 2022-04-28 DIAGNOSIS — T451X5A Adverse effect of antineoplastic and immunosuppressive drugs, initial encounter: Secondary | ICD-10-CM | POA: Diagnosis not present

## 2022-04-28 DIAGNOSIS — I10 Essential (primary) hypertension: Secondary | ICD-10-CM

## 2022-04-28 DIAGNOSIS — I77810 Thoracic aortic ectasia: Secondary | ICD-10-CM

## 2022-04-28 DIAGNOSIS — I2584 Coronary atherosclerosis due to calcified coronary lesion: Secondary | ICD-10-CM

## 2022-04-28 DIAGNOSIS — T451X5D Adverse effect of antineoplastic and immunosuppressive drugs, subsequent encounter: Secondary | ICD-10-CM

## 2022-04-28 DIAGNOSIS — E785 Hyperlipidemia, unspecified: Secondary | ICD-10-CM

## 2022-04-28 DIAGNOSIS — R072 Precordial pain: Secondary | ICD-10-CM

## 2022-04-28 LAB — BASIC METABOLIC PANEL
Anion gap: 8 (ref 5–15)
BUN: 13 mg/dL (ref 6–20)
CO2: 24 mmol/L (ref 22–32)
Calcium: 9.8 mg/dL (ref 8.9–10.3)
Chloride: 107 mmol/L (ref 98–111)
Creatinine, Ser: 1.15 mg/dL (ref 0.61–1.24)
GFR, Estimated: >60 mL/min (ref >60)
Glucose, Bld: 127 mg/dL — ABNORMAL HIGH (ref 70–99)
Potassium: 4 mmol/L (ref 3.5–5.1)
Sodium: 139 mmol/L (ref 135–145)

## 2022-04-28 LAB — CBC
HCT: 50.9 % (ref 39.0–52.0)
Hemoglobin: 17.3 g/dL — ABNORMAL HIGH (ref 13.0–17.0)
MCH: 29.1 pg (ref 26.0–34.0)
MCHC: 34 g/dL (ref 30.0–36.0)
MCV: 85.5 fL (ref 80.0–100.0)
Platelets: 176 10*3/uL (ref 150–400)
RBC: 5.95 MIL/uL — ABNORMAL HIGH (ref 4.22–5.81)
RDW: 14.9 % (ref 11.5–15.5)
WBC: 8.7 10*3/uL (ref 4.0–10.5)
nRBC: 0 % (ref 0.0–0.2)

## 2022-04-28 MED ORDER — SODIUM CHLORIDE 0.9% FLUSH: 3.0000 mL | Freq: Two times a day (BID) | INTRAVENOUS | Status: DC

## 2022-04-28 NOTE — Patient Instructions (Addendum)
Medication Instructions:  No changes at this time.   *If you need a refill on your cardiac medications before your next appointment, please call your pharmacy*   Lab Work: CBC & BMET to be done over at the [facility]. Make sure to stop at registration.   If you have labs (blood work) drawn today and your tests are completely normal, you will receive your results only by: [place] (if you have MyChart) OR A paper copy in the mail If you have any lab test that is abnormal or we need to change your treatment, we will call you to review the results.   Testing/Procedures: [facility] Cardiac Cath Instructions  You are scheduled for a Cardiac Cath on: Monday June 05, 2022 Please arrive at 08:30 am on the day of your procedure Please expect a call from our [place] to pre-register you Do not eat/drink anything after midnight Someone will need to drive you home It is recommended someone be with you for the first 24 hours after your procedure Wear clothes that are easy to get on/off and wear slip on shoes if possible   Medications bring a current list of all medications with you  _XX__ You may take all of your medications the morning of your procedure with enough water to swallow safely   Day of your procedure: Arrive at the [facility] entrance.  Free valet service is available.  After entering the [facility] please check-in at the registration desk (1st desk on your right) to receive your armband. After receiving your armband someone will escort you to the cardiac cath/special procedures waiting area.  The usual length of stay after your procedure is about 2 to 3 hours.  This can vary.  If you have any questions, please call our office at [phone], or you may call the cardiac cath lab at [facility] directly at [phone]    Follow-Up: At [facility], you and your health needs are our priority.  As part of our continuing mission to  provide you with exceptional heart care, we have created designated Provider Care Teams.  These Care Teams include your primary Cardiologist (physician) and Advanced Practice Providers (APPs -  Physician Assistants and Nurse Practitioners) who all work together to provide you with the care you need, when you need it.   Your next appointment:   2 week(s) post heart cath  The format for your next appointment:   In Person  Provider:   [name], MD or Gerrie Nordmann, NP     Important Information About Sugar

## 2022-04-28 NOTE — Progress Notes (Signed)
Pre cath labs a stable with no changes needed to made to medications at this time.

## 2022-04-29 ENCOUNTER — Telehealth: Payer: Self-pay | Admitting: *Deleted

## 2022-04-29 NOTE — Telephone Encounter (Signed)
-----   Message from Wellington Hampshire, MD sent at 04/28/2022  9:17 AM EDT ----- Regarding: RE: Left heart cath [facility] thanks.  I think the AVS is wrong as you put the cath is scheduled on 06/05/2022.  Can you please make sure the patient knows the exact date?   ----- Message ----- From: Valora Corporal, RN Sent: 04/28/2022   8:55 AM EDT To: Gerrie Nordmann, NP; Wellington Hampshire, MD; # Subject: Left heart cath                                Left heart cath 05/05/22 09:30 am [facility] Ordered by Gerrie Nordmann NP To be done by Dr. Fletcher Anon

## 2022-04-29 NOTE — Telephone Encounter (Signed)
Spoke with patient to clarify that his heart cath is on 05/05/22. He verbalized understanding with no further questions at this time.

## 2022-05-05 ENCOUNTER — Other Ambulatory Visit: Payer: Self-pay | Admitting: *Deleted

## 2022-05-05 ENCOUNTER — Ambulatory Visit
Admission: RE | Admit: 2022-05-05 | Discharge: 2022-05-05 | Disposition: A | Discharge status: Home / Self Care | Payer: Commercial Managed Care - PPO | Attending: Cardiovascular Disease | Admitting: Cardiovascular Disease

## 2022-05-05 ENCOUNTER — Encounter
Admission: RE | Disposition: A | Discharge status: Home / Self Care | Payer: Self-pay | Source: Home / Self Care | Attending: Cardiovascular Disease

## 2022-05-05 ENCOUNTER — Other Ambulatory Visit: Payer: Self-pay

## 2022-05-05 ENCOUNTER — Encounter: Payer: Self-pay | Admitting: Cardiovascular Disease

## 2022-05-05 DIAGNOSIS — Z6836 Body mass index (BMI) 36.0-36.9, adult: Secondary | ICD-10-CM | POA: Diagnosis not present

## 2022-05-05 DIAGNOSIS — I10 Essential (primary) hypertension: Secondary | ICD-10-CM | POA: Insufficient documentation

## 2022-05-05 DIAGNOSIS — I2510 Atherosclerotic heart disease of native coronary artery without angina pectoris: Secondary | ICD-10-CM

## 2022-05-05 DIAGNOSIS — E785 Hyperlipidemia, unspecified: Secondary | ICD-10-CM | POA: Diagnosis not present

## 2022-05-05 DIAGNOSIS — C8330 Diffuse large B-cell lymphoma, unspecified site: Secondary | ICD-10-CM | POA: Insufficient documentation

## 2022-05-05 DIAGNOSIS — Z79899 Other long term (current) drug therapy: Secondary | ICD-10-CM | POA: Insufficient documentation

## 2022-05-05 DIAGNOSIS — R931 Abnormal findings on diagnostic imaging of heart and coronary circulation: Secondary | ICD-10-CM

## 2022-05-05 DIAGNOSIS — E669 Obesity, unspecified: Secondary | ICD-10-CM | POA: Insufficient documentation

## 2022-05-05 DIAGNOSIS — I25118 Atherosclerotic heart disease of native coronary artery with other forms of angina pectoris: Secondary | ICD-10-CM | POA: Diagnosis not present

## 2022-05-05 DIAGNOSIS — I429 Cardiomyopathy, unspecified: Secondary | ICD-10-CM | POA: Diagnosis not present

## 2022-05-05 DIAGNOSIS — R072 Precordial pain: Secondary | ICD-10-CM

## 2022-05-05 DIAGNOSIS — Z7982 Long term (current) use of aspirin: Secondary | ICD-10-CM | POA: Insufficient documentation

## 2022-05-05 DIAGNOSIS — I5022 Chronic systolic (congestive) heart failure: Secondary | ICD-10-CM

## 2022-05-05 DIAGNOSIS — I2582 Chronic total occlusion of coronary artery: Secondary | ICD-10-CM | POA: Insufficient documentation

## 2022-05-05 HISTORY — PX: LEFT HEART CATH AND CORONARY ANGIOGRAPHY: CATH118249

## 2022-05-05 SURGERY — LEFT HEART CATH AND CORONARY ANGIOGRAPHY
Anesthesia: Moderate Sedation

## 2022-05-05 MED ORDER — HEPARIN (PORCINE) IN NACL 1000-0.9 UT/500ML-% IV SOLN
INTRAVENOUS | Status: DC | PRN
  Administered 2022-05-05 (×2): 500 mL

## 2022-05-05 MED ORDER — ISOSORBIDE MONONITRATE ER 30 MG PO TB24: 30.0000 mg | ORAL_TABLET | Freq: Every day | ORAL | 6 refills | Status: DC

## 2022-05-05 MED ORDER — ASPIRIN 81 MG PO CHEW: 81.0000 mg | CHEWABLE_TABLET | ORAL | Status: DC

## 2022-05-05 MED ORDER — ACETAMINOPHEN 325 MG PO TABS: 650.0000 mg | ORAL_TABLET | ORAL | Status: DC | PRN

## 2022-05-05 MED ORDER — VERAPAMIL HCL 2.5 MG/ML IV SOLN
INTRAVENOUS | Status: DC | PRN
  Administered 2022-05-05: 2.5 mg via INTRA_ARTERIAL

## 2022-05-05 MED ORDER — MIDAZOLAM HCL 2 MG/2ML IJ SOLN
INTRAMUSCULAR | Status: DC | PRN
  Administered 2022-05-05 (×2): 1 mg via INTRAVENOUS

## 2022-05-05 MED ORDER — SODIUM CHLORIDE 0.9% FLUSH: 3.0000 mL | Freq: Two times a day (BID) | INTRAVENOUS | Status: DC

## 2022-05-05 MED ORDER — SODIUM CHLORIDE 0.9 % IV SOLN: 250.0000 mL | INTRAVENOUS | Status: DC | PRN

## 2022-05-05 MED ORDER — FENTANYL CITRATE (PF) 100 MCG/2ML IJ SOLN
INTRAMUSCULAR | Status: DC | PRN
  Administered 2022-05-05 (×2): 25 ug via INTRAVENOUS

## 2022-05-05 MED ORDER — FENTANYL CITRATE (PF) 100 MCG/2ML IJ SOLN
INTRAMUSCULAR | Status: AC
  Filled 2022-05-05: qty 2

## 2022-05-05 MED ORDER — LIDOCAINE HCL (PF) 1 % IJ SOLN
INTRAMUSCULAR | Status: DC | PRN
  Administered 2022-05-05: 2 mL

## 2022-05-05 MED ORDER — HEPARIN SODIUM (PORCINE) 1000 UNIT/ML IJ SOLN
INTRAMUSCULAR | Status: AC
  Filled 2022-05-05: qty 10

## 2022-05-05 MED ORDER — SODIUM CHLORIDE 0.9% FLUSH: 3.0000 mL | INTRAVENOUS | Status: DC | PRN

## 2022-05-05 MED ORDER — HEPARIN (PORCINE) IN NACL 1000-0.9 UT/500ML-% IV SOLN
INTRAVENOUS | Status: AC
  Filled 2022-05-05: qty 1000

## 2022-05-05 MED ORDER — ONDANSETRON HCL 4 MG/2ML IJ SOLN: 4.0000 mg | Freq: Four times a day (QID) | INTRAMUSCULAR | Status: DC | PRN

## 2022-05-05 MED ORDER — SODIUM CHLORIDE 0.9 % IV SOLN: INTRAVENOUS | Status: DC

## 2022-05-05 MED ORDER — VERAPAMIL HCL 2.5 MG/ML IV SOLN
INTRAVENOUS | Status: AC
  Filled 2022-05-05: qty 2

## 2022-05-05 MED ORDER — HEPARIN SODIUM (PORCINE) 1000 UNIT/ML IJ SOLN
INTRAMUSCULAR | Status: DC | PRN
  Administered 2022-05-05: 5000 [IU] via INTRAVENOUS

## 2022-05-05 MED ORDER — IOHEXOL 300 MG/ML  SOLN
INTRAMUSCULAR | Status: DC | PRN
  Administered 2022-05-05: 57 mL

## 2022-05-05 MED ORDER — MIDAZOLAM HCL 2 MG/2ML IJ SOLN
INTRAMUSCULAR | Status: AC
  Filled 2022-05-05: qty 2

## 2022-05-05 MED ORDER — LIDOCAINE HCL 1 % IJ SOLN
INTRAMUSCULAR | Status: AC
  Filled 2022-05-05: qty 20

## 2022-05-05 SURGICAL SUPPLY — 11 items
CATH 5FR JL3.5 JR4 ANG PIG MP (CATHETERS) IMPLANT
CATH INFINITI 5FR JK (CATHETERS) IMPLANT
DEVICE RAD TR BAND REGULAR (VASCULAR PRODUCTS) IMPLANT
DRAPE BRACHIAL (DRAPES) IMPLANT
GLIDESHEATH SLEND SS 6F .021 (SHEATH) IMPLANT
GUIDEWIRE INQWIRE 1.5J.035X260 (WIRE) IMPLANT
INQWIRE 1.5J .035X260CM (WIRE) ×1
PACK CARDIAC CATH (CUSTOM PROCEDURE TRAY) ×1 IMPLANT
PROTECTION STATION PRESSURIZED (MISCELLANEOUS) ×1
SET ATX SIMPLICITY (MISCELLANEOUS) IMPLANT
STATION PROTECTION PRESSURIZED (MISCELLANEOUS) IMPLANT

## 2022-05-05 NOTE — Interval H&P Note (Signed)
Cath Lab Visit (complete for each Cath Lab visit)  Clinical Evaluation Leading to the Procedure:   ACS: No.  Non-ACS:    Anginal Classification: CCS III  Anti-ischemic medical therapy: Minimal Therapy (1 class of medications)  Non-Invasive Test Results: High-risk stress test findings: cardiac mortality >3%/year  Prior CABG: No previous CABG      History and Physical Interval Note:  05/05/2022 10:46 AM  Ronnie Mcintyre  has presented today for surgery, with the diagnosis of LT Heart Cath   Abnormal.  The various methods of treatment have been discussed with the patient and family. After consideration of risks, benefits and other options for treatment, the patient has consented to  Procedure(s): LEFT HEART CATH AND CORONARY ANGIOGRAPHY (N/A) as a surgical intervention.  The patient's history has been reviewed, patient examined, no change in status, stable for surgery.  I have reviewed the patient's chart and labs.  Questions were answered to the patient's satisfaction.     [name]

## 2022-05-19 ENCOUNTER — Ambulatory Visit: Payer: Commercial Managed Care - PPO | Attending: Cardiovascular Disease

## 2022-05-19 DIAGNOSIS — R072 Precordial pain: Secondary | ICD-10-CM | POA: Diagnosis not present

## 2022-05-19 DIAGNOSIS — I5022 Chronic systolic (congestive) heart failure: Secondary | ICD-10-CM

## 2022-05-19 LAB — ECHOCARDIOGRAM COMPLETE
AR max vel: 3.39 cm2
AV Peak grad: 3.6 mmHg
Ao pk vel: 0.94 m/s
Area-P 1/2: 2.99 cm2
Calc EF: 52.8 %
MV M vel: 2.64 m/s
MV Peak grad: 27.8 mmHg
P 1/2 time: 1316 msec
S' Lateral: 2.6 cm
Single Plane A2C EF: 53.3 %
Single Plane A4C EF: 50.5 %

## 2022-05-19 MED ORDER — PERFLUTREN LIPID MICROSPHERE
1.0000 mL | INTRAVENOUS | Status: AC | PRN
  Administered 2022-05-19: 2 mL via INTRAVENOUS

## 2022-05-22 NOTE — Progress Notes (Signed)
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   Hypertension    Non Hodgkin's lymphoma ([place])    TIA (transient ischemic attack)    Past Surgical History:  Procedure Laterality Date   ANKLE SURGERY Right    LEFT HEART CATH AND CORONARY ANGIOGRAPHY N/A 05/05/2022   Procedure: LEFT HEART CATH AND CORONARY ANGIOGRAPHY;  Surgeon: Wellington Hampshire, MD;  Location: [facility];  Service: Cardiovascular;  Laterality: N/A;   PORTACATH PLACEMENT Right 02/11/2018   Procedure: INSERTION PORT-A-CATH;  Surgeon: Jules Husbands, MD;  Location: [facility];  Service: General;  Laterality: Right;    Allergies  No Known Allergies  History of Present Illness    Ronnie Mcintyre is a 56 year old male with a longstanding history of cardiomyopathy due to chemotherapy, large B-cell lymphoma, obesity, hypertension, and hyperlipidemia.  He was diagnosed with B-cell lymphoma in June 2019 and underwent chemotherapy.  Echocardiogram before that revealed LVEF 55-60%, no regional wall motion abnormality, and mildly dilated aortic root at 40 mm.  Repeat echocardiogram after third cycle of chemotherapy showed LVEF 50-55% with severe hypokinesis of the mid to distal anterior septal anterior and apical myocardium.  Treadmill Myoview on October 2019 revealed small apical defect due to apical thinning although small infarct cannot be excluded.  It was an overall low risk study with an EF of 50%.  Repeat echocardiogram done 11/2020 revealed LVEF of 40-45% at which time he  was started on Entresto.  He did undergo coronary CTA on 04/24/2022 which revealed coronary calcium score of 2862 which is the 99th percentile for age and sex matched control.  Normal coronary origin with right dominance, chronic total occlusion of proximal LAD (100%), moderate mid distal RCA stenosis (50% - 69%), mild proximal left circumflex stenosis (25%), moderate stenosis in total coronary occlusion with cardiac catheterization recommended.  FFR analysis revealed left main no significant stenosis, LAD total occlusion in the proximal segment, left circumflex with no significant stenosis, FFR CT 0.96 RCA no significant in-stent stenosis with FFR of 0.94.  He underwent left heart catheterization on 05/05/2022 which revealed significant two-vessel coronary artery disease with chronically occluded mid LAD with faint left to left and right to left collaterals.  In addition there is significant stenosis in the ostial right PDA and ostial right posterior AV groove arteries supplying a relatively small area.  First diagonal has significant proximal disease as well.  He also was found to have moderately elevated left ventricular end-diastolic pressure 22 mmHg.  He was recommended for maximizing medical therapy for now and was started on Imdur.  He was then scheduled for an echocardiogram.  If his EF was preserved, revascularization with CABG or CTO PCI can be considered.  If EF was low cardiac viability might have to be evaluated at that time.  Echocardiogram completed 05/19/2022 revealed LVEF of 50%, no regional wall motion abnormality, G1 DD, and trivial aortic regurgitation.  He was advised he has a low normal ejection fraction which seem to be stable.  He returns to clinic today with  continued left upper chest discomfort.  He states that it is slightly improved but is still just a nagging discomfort.  He has some associated shortness of breath and headache.  He relates the headache to the Imdur and has been  taking Tylenol when he takes his previously added Imdur.  Denies any recent hospitalizations or visits to the emergency department.  Home Medications    Current Outpatient Medications  Medication Sig Dispense Refill   acetaminophen (TYLENOL) 325 MG tablet Take 2 tablets (650 mg total) by mouth every 6 (six) hours as needed for mild pain (or Fever >/= 101).     aspirin EC 81 MG tablet Take 1 tablet (81 mg total) by mouth daily. Swallow whole.     carvedilol (COREG) 12.5 MG tablet TAKE 1 TABLET (12.5MG TOTAL) BY MOUTH TWICE A DAY WITH MEALS 180 tablet 2   isosorbide mononitrate (IMDUR) 30 MG 24 hr tablet Take 1 tablet (30 mg total) by mouth daily. 30 tablet 6   rosuvastatin (CRESTOR) 40 MG tablet Take 1 tablet (40 mg total) by mouth daily. 90 tablet 2   sacubitril-valsartan (ENTRESTO) 49-51 MG Take 1 tablet by mouth 2 (two) times daily. 180 tablet 0   spironolactone (ALDACTONE) 25 MG tablet TAKE 1/2 TABLET BY MOUTH EVERY DAY 45 tablet 0   clopidogrel (PLAVIX) 75 MG tablet Take 1 tablet (75 mg total) by mouth daily. (Patient not taking: Reported on 04/24/2022) 30 tablet 2   ezetimibe (ZETIA) 10 MG tablet Take 1 tablet (10 mg total) by mouth daily. (Patient not taking: Reported on 05/23/2022) 90 tablet 2   No current facility-administered medications for this visit.     Family History    Family History  Problem Relation Age of Onset   Hypertension Mother    Hypertension Father    Multiple myeloma Father    Transient ischemic attack Father    He indicated that his mother is alive. He indicated that his father is deceased.  Social History    Social History   Socioeconomic History   Marital status: Single    Spouse name: Not on file   Number of children: 1   Years of education: Not on file   Highest education level: Not on file  Occupational History   Not on file  Tobacco Use   Smoking status: Never   Smokeless tobacco: Never  Vaping Use   Vaping Use: Never used  Substance and  Sexual Activity   Alcohol use: Yes    Comment: rarely   Drug use: No   Sexual activity: Not Currently  Other Topics Concern   Not on file  Social History Narrative   Right handed   Caffeine oz per day    Son lives in [place] country[place] 54 y.o.   Social Determinants of Health   Financial Resource Strain: Not on file  Food Insecurity: Not on file  Transportation Needs: Not on file  Physical Activity: Not on file  Stress: Not on file  Social Connections: Not on file  Intimate Partner Violence: Not on file     Review of Systems    General:  No chills, fever, night sweats or weight changes.  Endorses fatigue Cardiovascular: Endorses chest pain, dyspnea on exertion, edema, denies orthopnea, palpitations, paroxysmal nocturnal dyspnea. Dermatological: No rash, lesions/masses Respiratory: No cough, endorses dyspnea Urologic: No hematuria, dysuria Abdominal:   No nausea, vomiting, diarrhea, bright red blood per rectum, melena, or hematemesis Neurologic:  No visual changes, wkns, changes in  mental status. All other systems reviewed and are otherwise negative except as noted above.   Physical Exam    VS:  BP 112/76 (BP Location: Right Arm, Patient Position: Sitting, Cuff Size: Normal)   Pulse 83   Ht 6' (1.829 m)   Wt 263 lb (119.3 kg)   SpO2 97%   BMI 35.67 kg/m  , BMI Body mass index is 35.67 kg/m.     GEN: Well nourished, well developed, in no acute distress. HEENT: normal. Neck: Supple, no JVD, carotid bruits, or masses. Cardiac: RRR, no murmurs, rubs, or gallops. No clubbing, cyanosis, edema.  Radials/DP/PT 2+ and equal bilaterally.  Respiratory:  Respirations regular and unlabored, clear to auscultation bilaterally. GI: Soft, nontender, nondistended, BS + x 4. MS: no deformity or atrophy. Skin: warm and dry, no rash. Neuro:  Strength and sensation are intact. Psych: Normal affect.  Accessory Clinical Findings    ECG personally reviewed by me today-sinus rhythm  with a rate of 83, left anterior fascicular block- No acute changes  Lab Results  Component Value Date   WBC 8.7 04/28/2022   HGB 17.3 (H) 04/28/2022   HCT 50.9 04/28/2022   MCV 85.5 04/28/2022   PLT 176 04/28/2022   Lab Results  Component Value Date   CREATININE 1.15 04/28/2022   BUN 13 04/28/2022   NA 139 04/28/2022   K 4.0 04/28/2022   CL 107 04/28/2022   CO2 24 04/28/2022   Lab Results  Component Value Date   ALT 30 12/17/2021   AST 24 12/17/2021   ALKPHOS 50 12/17/2021   BILITOT 1.1 12/17/2021   Lab Results  Component Value Date   CHOL 154 10/02/2021   HDL 35 (L) 10/02/2021   LDLCALC 89 10/02/2021   TRIG 152 (H) 10/02/2021   CHOLHDL 4.4 10/02/2021    Lab Results  Component Value Date   HGBA1C 5.4 10/02/2021    Assessment & Plan   1.  Coronary artery disease with a left heart catheterization completed on 05/05/2022 which revealed significant two-vessel coronary artery disease with chronically occluded mid LAD with faint left to left and right to left collaterals.  In addition there was significant stenosis in the ostial right PDA and ostial right posterior AV groove artery supplying relatively small areas.  The first diagonal has significant proximal disease as well.  It was recommended we maximize medical therapy and he was started on Imdur 30 mg daily.  He also was reevaluated with an echocardiogram to determine ejection fraction which was 50% which was low normal but slightly improved from previous study.  Records were sent to [facility] for possible revascularization with CABG or CTO PCI to be considered. Dr. Martinique had reviewed records and thought that possible PCI for the CTO would be doable.  Discussed with patient today and with continuing anginal symptoms that he continues to suffer with associated shortness of breath he is willing to discuss that option as well.  CV has been scheduled for follow-up with Dr. Irish Lack for possible PCI of the CTO of mid LAD.  At this  time he has been continued on aspirin 81 mg daily, carvedilol 12.5 mg twice daily, Imdur 30 mg daily, rosuvastatin 40 mg daily, Entresto 49/51 mg twice daily and spironolactone 12.5 mg daily.  2.  Cardiomyopathy with LVEF of 50%.  Likely due to previous chemotherapy.  Ejection fraction has remained stable overall.  Euvolemic on exam today.  He is continued on Entresto 49/51 mg twice daily and carvedilol 12.5  mg twice daily and spironolactone 25 mg daily.  3.  Essential hypertension with blood pressure today of 112/76.  Remained stable.  He has been continued on [employer] and spironolactone as well as Imdur.  He is also been encouraged to monitor his blood pressures at home.  4.  Hyperlipidemia with most recent LDL 53 in April 2023.  He has been continued on rosuvastatin 40 mg daily.  5.  Mildly dilated ascending aorta at 41 mm and echocardiogram done 10/02/2021.  Continue to follow-up with surveillance studies  6.  Disposition patient return to clinic to see MD/APP in 1 month or sooner if needed  Kaci Dillie, NP 05/23/2022, 3:35 PM

## 2022-05-23 ENCOUNTER — Encounter: Payer: Self-pay | Admitting: Cardiology

## 2022-05-23 ENCOUNTER — Ambulatory Visit: Payer: Commercial Managed Care - PPO | Attending: Cardiology | Admitting: Cardiology

## 2022-05-23 VITALS — BP 112/76 | HR 83 | Ht 72.0 in | Wt 263.0 lb

## 2022-05-23 DIAGNOSIS — I10 Essential (primary) hypertension: Secondary | ICD-10-CM

## 2022-05-23 DIAGNOSIS — I5022 Chronic systolic (congestive) heart failure: Secondary | ICD-10-CM | POA: Diagnosis not present

## 2022-05-23 DIAGNOSIS — I25119 Atherosclerotic heart disease of native coronary artery with unspecified angina pectoris: Secondary | ICD-10-CM | POA: Diagnosis not present

## 2022-05-23 DIAGNOSIS — I77810 Thoracic aortic ectasia: Secondary | ICD-10-CM

## 2022-05-23 DIAGNOSIS — E785 Hyperlipidemia, unspecified: Secondary | ICD-10-CM

## 2022-05-23 NOTE — Patient Instructions (Addendum)
Medication Instructions:  Your physician recommends that you continue on your current medications as directed. Please refer to the Current Medication list given to you today.   Labwork: None  Testing/Procedures: None  Follow-Up: Follow up with Dr. Martinique next available.   Follow up with Gerrie Nordmann, NP after your appointment with Dr. Martinique.   Any Other Special Instructions Will Be Listed Below (If Applicable).     If you need a refill on your cardiac medications before your next appointment, please call your pharmacy.

## 2022-06-09 ENCOUNTER — Other Ambulatory Visit: Payer: Self-pay | Admitting: Cardiovascular Disease

## 2022-06-23 ENCOUNTER — Inpatient Hospital Stay: Payer: Commercial Managed Care - PPO

## 2022-06-23 ENCOUNTER — Inpatient Hospital Stay: Payer: Commercial Managed Care - PPO | Attending: Oncology | Admitting: Oncology

## 2022-06-23 ENCOUNTER — Encounter: Payer: Self-pay | Admitting: Oncology

## 2022-06-23 VITALS — BP 120/97 | HR 89 | Temp 97.2°F | Resp 18 | Wt 261.5 lb

## 2022-06-23 DIAGNOSIS — Z8579 Personal history of other malignant neoplasms of lymphoid, hematopoietic and related tissues: Secondary | ICD-10-CM | POA: Diagnosis not present

## 2022-06-23 DIAGNOSIS — Z08 Encounter for follow-up examination after completed treatment for malignant neoplasm: Secondary | ICD-10-CM | POA: Diagnosis not present

## 2022-06-23 DIAGNOSIS — M542 Cervicalgia: Secondary | ICD-10-CM | POA: Insufficient documentation

## 2022-06-23 DIAGNOSIS — R0602 Shortness of breath: Secondary | ICD-10-CM | POA: Diagnosis not present

## 2022-06-23 DIAGNOSIS — R072 Precordial pain: Secondary | ICD-10-CM | POA: Diagnosis not present

## 2022-06-23 DIAGNOSIS — C8330 Diffuse large B-cell lymphoma, unspecified site: Secondary | ICD-10-CM | POA: Insufficient documentation

## 2022-06-23 LAB — CBC WITH DIFFERENTIAL/PLATELET
Abs Immature Granulocytes: 0.02 10*3/uL (ref 0.00–0.07)
Basophils Absolute: 0.1 10*3/uL (ref 0.0–0.1)
Basophils Relative: 1 %
Eosinophils Absolute: 0.1 10*3/uL (ref 0.0–0.5)
Eosinophils Relative: 1 %
HCT: 48.5 % (ref 39.0–52.0)
Hemoglobin: 16.6 g/dL (ref 13.0–17.0)
Immature Granulocytes: 0 %
Lymphocytes Relative: 16 %
Lymphs Abs: 1.3 10*3/uL (ref 0.7–4.0)
MCH: 29.3 pg (ref 26.0–34.0)
MCHC: 34.2 g/dL (ref 30.0–36.0)
MCV: 85.7 fL (ref 80.0–100.0)
Monocytes Absolute: 0.6 10*3/uL (ref 0.1–1.0)
Monocytes Relative: 8 %
Neutro Abs: 6.3 10*3/uL (ref 1.7–7.7)
Neutrophils Relative %: 74 %
Platelets: 209 10*3/uL (ref 150–400)
RBC: 5.66 MIL/uL (ref 4.22–5.81)
RDW: 14.2 % (ref 11.5–15.5)
WBC: 8.4 10*3/uL (ref 4.0–10.5)
nRBC: 0 % (ref 0.0–0.2)

## 2022-06-23 LAB — COMPREHENSIVE METABOLIC PANEL
ALT: 25 U/L (ref 0–44)
AST: 23 U/L (ref 15–41)
Albumin: 3.8 g/dL (ref 3.5–5.0)
Alkaline Phosphatase: 51 U/L (ref 38–126)
Anion gap: 10 (ref 5–15)
BUN: 11 mg/dL (ref 6–20)
CO2: 26 mmol/L (ref 22–32)
Calcium: 9.1 mg/dL (ref 8.9–10.3)
Chloride: 102 mmol/L (ref 98–111)
Creatinine, Ser: 1 mg/dL (ref 0.61–1.24)
GFR, Estimated: >60 mL/min (ref >60)
Glucose, Bld: 94 mg/dL (ref 70–99)
Potassium: 4.1 mmol/L (ref 3.5–5.1)
Sodium: 138 mmol/L (ref 135–145)
Total Bilirubin: 1.3 mg/dL — ABNORMAL HIGH (ref 0.3–1.2)
Total Protein: 6.5 g/dL (ref 6.5–8.1)

## 2022-06-23 LAB — LACTATE DEHYDROGENASE: LDH: 121 U/L (ref 98–192)

## 2022-06-23 NOTE — Progress Notes (Signed)
Hematology/Oncology Consult note [facility]  [phone] [phone]  Patient Care Team: Rusty Aus, MD as PCP - General (Internal Medicine) Wellington Hampshire, MD as PCP - Cardiology (Cardiology) Sindy Guadeloupe, MD as Consulting Physician (Hematology and Oncology)   Name of the patient: Ronnie Mcintyre  [id number]  24-Dec-1965   Date of visit: 06/23/22  Diagnosis- history of large B-cell lymphoma 2019 currently in CR 1   Chief complaint/ Reason for visit-routine follow-up of diffuse large B-cell lymphoma  Heme/Onc history: [name] is a 56 y.o. 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  [facility] pathology revealed necrotic tissue.  [facility] consultation revealed neoplastic cells are positive for CD20, BCL-6, MUM-1, and BCL-2. The neoplastic cells were negative for CD30, CD3, , CD10, c-MYC, EBV ISH, AE1/3, and PAX-8. Ki67 is 60-70%. 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 The absence of CD30 expression makes primary mediastinal large B-cell lymphoma less likely. FISH for MYC rearrangement was negative. IPI score, age adjusted IPI, are low (1) and NCCN IPI score is low-intermediate (2).   He presented with upper chest fullness with radiation to his neck and arm.  He has had B symptoms (sweats and weight loss) worrisome for lymphoma.  LDH is 273.  Uric acid was normal.  Beta-HCG and AFP were normal on 01/16/2018.  On   Bone marrow biopsy on 02/03/2018 revealed a slightly hypercellular bone marrow for age with trilineage hematopoiesis.  There was no monoclonal B-cell population or abnormal T-cell phenotype identified.  Cytogenetics were normal (46, XY).   He received 6 cycles of RCHOP chemotherapy (02/16/2018 - 06/10/2018) with Neulasta support.  He received radiation (3600 cGy in 18 fractions) from 07/27/2018 - 08/19/2018.   Patient has had  surveillance scans up until November 2021 Which showed small residual MR for soft tissue density in the mediastinum measuring 2.4 cm x 1.2 cm with no significant change over the last 1 year.    Interval history-patient reports left-sided neck pain as well as substernal chest pain and exertional shortness of breath on and off.  He has seen cardiology and also has appointment with cardiothoracic surgery this week.  He recently underwent cardiac catheterization in October 2023 as well.  He is otherwise continuing his full-time job.  Denies any changes in his appetite or unintentional weight loss.   ECOG PS- 1 Pain scale- 3   Review of systems- Review of Systems  Constitutional:  Negative for chills, fever, malaise/fatigue and weight loss.  HENT:  Negative for congestion, ear discharge and nosebleeds.   Eyes:  Negative for blurred vision.  Respiratory:  Positive for shortness of breath. Negative for cough, hemoptysis, sputum production and wheezing.   Cardiovascular:  Positive for chest pain. Negative for palpitations, orthopnea and claudication.  Gastrointestinal:  Negative for abdominal pain, blood in stool, constipation, diarrhea, heartburn, melena, nausea and vomiting.  Genitourinary:  Negative for dysuria, flank pain, frequency, hematuria and urgency.  Musculoskeletal:  Negative for back pain, joint pain and myalgias.  Skin:  Negative for rash.  Neurological:  Negative for dizziness, tingling, focal weakness, seizures, weakness and headaches.  Endo/Heme/Allergies:  Does not bruise/bleed easily.  Psychiatric/Behavioral:  Negative for depression and suicidal ideas. The patient does not have insomnia.       No Known Allergies   Past Medical History:  Diagnosis Date   Cancer ([place]) 12/2017  Non-Hogkins lumphoma   Hypertension    Non Hodgkin's lymphoma ([place])    TIA (transient ischemic attack)      Past Surgical History:  Procedure Laterality Date   ANKLE SURGERY Right    LEFT  HEART CATH AND CORONARY ANGIOGRAPHY N/A 05/05/2022   Procedure: LEFT HEART CATH AND CORONARY ANGIOGRAPHY;  Surgeon: Wellington Hampshire, MD;  Location: [facility];  Service: Cardiovascular;  Laterality: N/A;   PORTACATH PLACEMENT Right 02/11/2018   Procedure: INSERTION PORT-A-CATH;  Surgeon: Jules Husbands, MD;  Location: [facility];  Service: General;  Laterality: Right;    Social History   Socioeconomic History   Marital status: Single    Spouse name: Not on file   Number of children: 1   Years of education: Not on file   Highest education level: Not on file  Occupational History   Not on file  Tobacco Use   Smoking status: Never   Smokeless tobacco: Never  Vaping Use   Vaping Use: Never used  Substance and Sexual Activity   Alcohol use: Yes    Comment: rarely   Drug use: No   Sexual activity: Not Currently  Other Topics Concern   Not on file  Social History Narrative   Right handed   Caffeine oz per day    Son lives in [place] country[place] 54 y.o.   Social Determinants of Health   Financial Resource Strain: Not on file  Food Insecurity: Not on file  Transportation Needs: Not on file  Physical Activity: Not on file  Stress: Not on file  Social Connections: Not on file  Intimate Partner Violence: Not on file    Family History  Problem Relation Age of Onset   Hypertension Mother    Hypertension Father    Multiple myeloma Father    Transient ischemic attack Father      Current Outpatient Medications:    acetaminophen (TYLENOL) 325 MG tablet, Take 2 tablets (650 mg total) by mouth every 6 (six) hours as needed for mild pain (or Fever >/= 101)., Disp: , Rfl:    aspirin EC 81 MG tablet, Take 1 tablet (81 mg total) by mouth daily. Swallow whole., Disp: , Rfl:    carvedilol (COREG) 12.5 MG tablet, TAKE 1 TABLET (12.5MG TOTAL) BY MOUTH TWICE A DAY WITH MEALS, Disp: 180 tablet, Rfl: 2   isosorbide mononitrate (IMDUR) 30 MG 24 hr tablet, Take 1 tablet (30 mg  total) by mouth daily., Disp: 30 tablet, Rfl: 6   rosuvastatin (CRESTOR) 40 MG tablet, Take 1 tablet (40 mg total) by mouth daily., Disp: 90 tablet, Rfl: 2   sacubitril-valsartan (ENTRESTO) 49-51 MG, Take 1 tablet by mouth 2 (two) times daily., Disp: 180 tablet, Rfl: 0   spironolactone (ALDACTONE) 25 MG tablet, TAKE 1/2 TABLET BY MOUTH DAILY, Disp: 45 tablet, Rfl: 0   clopidogrel (PLAVIX) 75 MG tablet, Take 1 tablet (75 mg total) by mouth daily. (Patient not taking: Reported on 04/24/2022), Disp: 30 tablet, Rfl: 2   ezetimibe (ZETIA) 10 MG tablet, Take 1 tablet (10 mg total) by mouth daily. (Patient not taking: Reported on 05/23/2022), Disp: 90 tablet, Rfl: 2  Physical exam:  Vitals:   06/23/22 1421  BP: (!) 120/97  Pulse: 89  Resp: 18  Temp: (!) 97.2 F (36.2 C)  SpO2: 99%  Weight: 261 lb 8 oz (118.6 kg)   Physical Exam Constitutional:      General: He is not in acute distress. Cardiovascular:  Rate and Rhythm: Normal rate and regular rhythm.     Heart sounds: Normal heart sounds.  Pulmonary:     Effort: Pulmonary effort is normal.     Breath sounds: Normal breath sounds.  Abdominal:     General: Bowel sounds are normal.     Palpations: Abdomen is soft.  Lymphadenopathy:     Comments: No palpable cervical, supraclavicular, axillary or inguinal adenopathy    Skin:    General: Skin is warm and dry.  Neurological:     Mental Status: He is alert and oriented to person, place, and time.         Latest Ref Rng & Units 06/23/2022    1:49 PM  CMP  Glucose 70 - 99 mg/dL 94   BUN 6 - 20 mg/dL 11   Creatinine 0.61 - 1.24 mg/dL 1.00   Sodium 135 - 145 mmol/L 138   Potassium 3.5 - 5.1 mmol/L 4.1   Chloride 98 - 111 mmol/L 102   CO2 22 - 32 mmol/L 26   Calcium 8.9 - 10.3 mg/dL 9.1   Total Protein 6.5 - 8.1 g/dL 6.5   Total Bilirubin 0.3 - 1.2 mg/dL 1.3   Alkaline Phos 38 - 126 U/L 51   AST 15 - 41 U/L 23   ALT 0 - 44 U/L 25       Latest Ref Rng & Units 06/23/2022     1:49 PM  CBC  WBC 4.0 - 10.5 K/uL 8.4   Hemoglobin 13.0 - 17.0 g/dL 16.6   Hematocrit 39.0 - 52.0 % 48.5   Platelets 150 - 400 K/uL 209      Assessment and plan- Patient is a 56 y.o. male with history of stage Ib bulky large B-cell lymphoma diagnosed in 2019 s/p 6 cycles of R-CHOP followed by involved field radiation currently in CR 1. This is a routine follow-up visit  Clinically patient is doing well with no concerning signs and symptoms of recurrence based on today's exam.  He had a CT angio chest in September 2023 as well which did not show any evidence of adenopathy.  I will see him back in 6 months with labs.  We will be now entering fifth year of surveillance.  Symptoms of ischemic heart disease: Continue follow-up with cardiology   Visit Diagnosis 1. Encounter for follow-up surveillance of diffuse large B-cell lymphoma      Dr. Randa Evens, MD, MPH [facility] [phone] 06/23/2022 3:41 PM

## 2022-06-24 ENCOUNTER — Other Ambulatory Visit: Payer: Self-pay | Admitting: Cardiovascular Disease

## 2022-06-26 ENCOUNTER — Ambulatory Visit: Payer: Commercial Managed Care - PPO | Admitting: Interventional Cardiology

## 2022-06-26 ENCOUNTER — Telehealth: Payer: Self-pay | Admitting: Cardiology

## 2022-06-26 NOTE — Telephone Encounter (Signed)
-----   Message from Emily Filbert, RN sent at 06/26/2022 11:04 AM EST ----- It looks like the patient is scheduled for a possible procedure at [facility] on Monday 12/11-   Do one of you ladies mind reaching out to him to confirm and see is she is switching his care?    Thank you!  Lenda Kelp- FYI) ----- Message ----- From: Britt Bottom, CMA Sent: 06/25/2022   5:12 PM EST To: [facility] Triage  Pt f/u with Hamm[place], NP 06/30/2022. Pt told to f/u with Martinique and then F/u with Hammock. Pt cancelled last appointment with Irish Lack, MD. Pt had consult with [facility] 06/25/2022. Please advise if ok to keep future appointment.  #Scheduling please advise if pt will be switching care to [facility] or if pt is going to continue with [facility] care.

## 2022-06-26 NOTE — Telephone Encounter (Signed)
LVM to confirm if patient is transferring care to [facility]

## 2022-06-30 ENCOUNTER — Ambulatory Visit: Payer: Commercial Managed Care - PPO | Admitting: Cardiology

## 2022-07-28 ENCOUNTER — Other Ambulatory Visit: Payer: Self-pay

## 2022-07-28 ENCOUNTER — Encounter: Payer: Commercial Managed Care - PPO | Attending: Internal Medicine

## 2022-07-28 DIAGNOSIS — Z48812 Encounter for surgical aftercare following surgery on the circulatory system: Secondary | ICD-10-CM | POA: Insufficient documentation

## 2022-07-28 DIAGNOSIS — Z951 Presence of aortocoronary bypass graft: Secondary | ICD-10-CM | POA: Insufficient documentation

## 2022-07-28 NOTE — Progress Notes (Signed)
Virtual Visit completed. Patient informed on EP and RD appointment and 6 Minute walk test. Patient also informed of patient health questionnaires on My Chart. Patient Verbalizes understanding. Visit diagnosis can be found in [facility] 06/29/2022.

## 2022-07-29 VITALS — Ht 72.0 in | Wt 247.0 lb

## 2022-07-29 DIAGNOSIS — Z951 Presence of aortocoronary bypass graft: Secondary | ICD-10-CM | POA: Diagnosis present

## 2022-07-29 DIAGNOSIS — Z48812 Encounter for surgical aftercare following surgery on the circulatory system: Secondary | ICD-10-CM | POA: Diagnosis not present

## 2022-07-29 NOTE — Patient Instructions (Signed)
Patient Instructions  Patient Details  Name: Ronnie Mcintyre MRN: [id number] Date of Birth: [birth year] Referring Provider:  Rusty Aus, MD  Below are your personal goals for exercise, nutrition, and risk factors. Our goal is to help you stay on track towards obtaining and maintaining these goals. We will be discussing your progress on these goals with you throughout the program.  Initial Exercise Prescription:  Initial Exercise Prescription - 07/29/22 1300       Date of Initial Exercise RX and Referring Provider   Date 07/29/22    Referring Provider Emily Filbert MD      Oxygen   Maintain Oxygen Saturation 88% or higher      Treadmill   MPH 2.5    Grade 1    Minutes 15    METs 3.26      REL-XR   Level 1    Speed 60    Minutes 15    METs 3.91      T5 Nustep   Level 2    SPM 80    Minutes 15    METs 3.91      Prescription Details   Frequency (times per week) 3    Duration Progress to 30 minutes of continuous aerobic without signs/symptoms of physical distress      Intensity   THRR 40-80% of Max Heartrate 118-150    Ratings of Perceived Exertion 11-15    Perceived Dyspnea 0-4      Progression   Progression Continue to progress workloads to maintain intensity without signs/symptoms of physical distress.      Resistance Training   Training Prescription Yes    Weight 3 lb    Reps 10-15             Exercise Goals: Frequency: Be able to perform aerobic exercise two to three times per week in program working toward 2-5 days per week of home exercise.  Intensity: Work with a perceived exertion of 11 (fairly light) - 15 (hard) while following your exercise prescription.  We will make changes to your prescription with you as you progress through the program.   Duration: Be able to do 30 to 45 minutes of continuous aerobic exercise in addition to a 5 minute warm-up and a 5 minute cool-down routine.   Nutrition Goals: Your personal nutrition goals will be  established when you do your nutrition analysis with the dietician.  The following are general nutrition guidelines to follow: Cholesterol < '200mg'$ /day Sodium < '1500mg'$ /day Fiber: Men over 50 yrs - 30 grams per day  Personal Goals:  Personal Goals and Risk Factors at Admission - 07/29/22 1257       Core Components/Risk Factors/Patient Goals on Admission    Weight Management Yes;Weight Loss    Intervention Weight Management: Develop a combined nutrition and exercise program designed to reach desired caloric intake, while maintaining appropriate intake of nutrient and fiber, sodium and fats, and appropriate energy expenditure required for the weight goal.;Weight Management: Provide education and appropriate resources to help participant work on and attain dietary goals.;Weight Management/Obesity: Establish reasonable short term and long term weight goals.;Obesity: Provide education and appropriate resources to help participant work on and attain dietary goals.    Admit Weight 247 lb (112 kg)    Goal Weight: Short Term 240 lb (108.9 kg)    Goal Weight: Long Term 220 lb (99.8 kg)    Expected Outcomes Short Term: Continue to assess and modify interventions until short term weight is  achieved;Long Term: Adherence to nutrition and physical activity/exercise program aimed toward attainment of established weight goal;Weight Loss: Understanding of general recommendations for a balanced deficit meal plan, which promotes 1-2 lb weight loss per week and includes a negative energy balance of [phone] kcal/d;Understanding recommendations for meals to include 15-35% energy as protein, 25-35% energy from fat, 35-60% energy from carbohydrates, less than '200mg'$  of dietary cholesterol, 20-35 gm of total fiber daily;Understanding of distribution of calorie intake throughout the day with the consumption of 4-5 meals/snacks    Heart Failure Yes    Intervention Provide a combined exercise and nutrition program that is  supplemented with education, support and counseling about heart failure. Directed toward relieving symptoms such as shortness of breath, decreased exercise tolerance, and extremity edema.    Expected Outcomes Improve functional capacity of life;Short term: Attendance in program 2-3 days a week with increased exercise capacity. Reported lower sodium intake. Reported increased fruit and vegetable intake. Reports medication compliance.;Short term: Daily weights obtained and reported for increase. Utilizing diuretic protocols set by physician.;Long term: Adoption of self-care skills and reduction of barriers for early signs and symptoms recognition and intervention leading to self-care maintenance.    Hypertension Yes    Intervention Provide education on lifestyle modifcations including regular physical activity/exercise, weight management, moderate sodium restriction and increased consumption of fresh fruit, vegetables, and low fat dairy, alcohol moderation, and smoking cessation.;Monitor prescription use compliance.    Expected Outcomes Short Term: Continued assessment and intervention until BP is < 140/78m HG in hypertensive participants. < 130/881mHG in hypertensive participants with diabetes, heart failure or chronic kidney disease.;Long Term: Maintenance of blood pressure at goal levels.    Lipids Yes    Intervention Provide education and support for participant on nutrition & aerobic/resistive exercise along with prescribed medications to achieve LDL '70mg'$ , HDL >'40mg'$ .    Expected Outcomes Short Term: Participant states understanding of desired cholesterol values and is compliant with medications prescribed. Participant is following exercise prescription and nutrition guidelines.;Long Term: Cholesterol controlled with medications as prescribed, with individualized exercise RX and with personalized nutrition plan. Value goals: LDL < '70mg'$ , HDL > 40 mg.            Exercise Goals and Review:  Exercise  Goals     Row Name 07/29/22 1302             Exercise Goals   Increase Physical Activity Yes       Intervention Provide advice, education, support and counseling about physical activity/exercise needs.;Develop an individualized exercise prescription for aerobic and resistive training based on initial evaluation findings, risk stratification, comorbidities and participant's personal goals.       Expected Outcomes Short Term: Attend rehab on a regular basis to increase amount of physical activity.;Long Term: Add in home exercise to make exercise part of routine and to increase amount of physical activity.;Long Term: Exercising regularly at least 3-5 days a week.       Increase Strength and Stamina Yes       Intervention Develop an individualized exercise prescription for aerobic and resistive training based on initial evaluation findings, risk stratification, comorbidities and participant's personal goals.;Provide advice, education, support and counseling about physical activity/exercise needs.       Expected Outcomes Long Term: Improve cardiorespiratory fitness, muscular endurance and strength as measured by increased METs and functional capacity (6MWT);Short Term: Perform resistance training exercises routinely during rehab and add in resistance training at home;Short Term: Increase workloads from initial exercise prescription for  resistance, speed, and METs.       Able to understand and use rate of perceived exertion (RPE) scale Yes       Intervention Provide education and explanation on how to use RPE scale       Expected Outcomes Long Term:  Able to use RPE to guide intensity level when exercising independently;Short Term: Able to use RPE daily in rehab to express subjective intensity level       Able to understand and use Dyspnea scale Yes       Intervention Provide education and explanation on how to use Dyspnea scale       Expected Outcomes Short Term: Able to use Dyspnea scale daily in  rehab to express subjective sense of shortness of breath during exertion;Long Term: Able to use Dyspnea scale to guide intensity level when exercising independently       Knowledge and understanding of Target Heart Rate Range (THRR) Yes       Intervention Provide education and explanation of THRR including how the numbers were predicted and where they are located for reference       Expected Outcomes Short Term: Able to state/look up THRR;Short Term: Able to use daily as guideline for intensity in rehab;Long Term: Able to use THRR to govern intensity when exercising independently       Able to check pulse independently Yes       Intervention Provide education and demonstration on how to check pulse in carotid and radial arteries.;Review the importance of being able to check your own pulse for safety during independent exercise       Expected Outcomes Short Term: Able to explain why pulse checking is important during independent exercise;Long Term: Able to check pulse independently and accurately       Understanding of Exercise Prescription Yes       Intervention Provide education, explanation, and written materials on patient's individual exercise prescription       Expected Outcomes Short Term: Able to explain program exercise prescription;Long Term: Able to explain home exercise prescription to exercise independently

## 2022-07-29 NOTE — Progress Notes (Signed)
Cardiac Individual Treatment Plan  Patient Details  Name: Ronnie Mcintyre MRN: [id number] Date of Birth: 1966-03-16 Referring Provider:   Flowsheet Row Cardiac Rehab from 07/29/2022 in [facility] Cardiac and Pulmonary Rehab  Referring Provider Emily Filbert MD       Initial Encounter Date:  Flowsheet Row Cardiac Rehab from 07/29/2022 in [facility] Cardiac and Pulmonary Rehab  Date 07/29/22       Visit Diagnosis: S/P CABG x 3  Patient's Home Medications on Admission:  Current Outpatient Medications:    acetaminophen (TYLENOL) 325 MG tablet, Take 2 tablets (650 mg total) by mouth every 6 (six) hours as needed for mild pain (or Fever >/= 101)., Disp: , Rfl:    aspirin EC 81 MG tablet, Take 1 tablet (81 mg total) by mouth daily. Swallow whole., Disp: , Rfl:    carvedilol (COREG) 12.5 MG tablet, TAKE 1 TABLET (12.5MG TOTAL) BY MOUTH TWICE A DAY WITH MEALS (Patient not taking: Reported on 07/28/2022), Disp: 180 tablet, Rfl: 2   clopidogrel (PLAVIX) 75 MG tablet, Take 1 tablet (75 mg total) by mouth daily. (Patient not taking: Reported on 04/24/2022), Disp: 30 tablet, Rfl: 2   ezetimibe (ZETIA) 10 MG tablet, Take 1 tablet (10 mg total) by mouth daily. (Patient not taking: Reported on 05/23/2022), Disp: 90 tablet, Rfl: 2   furosemide (LASIX) 40 MG tablet, Take by mouth., Disp: , Rfl:    isosorbide mononitrate (IMDUR) 30 MG 24 hr tablet, Take 1 tablet (30 mg total) by mouth daily. (Patient not taking: Reported on 07/28/2022), Disp: 30 tablet, Rfl: 6   metoprolol tartrate (LOPRESSOR) 50 MG tablet, Take 1 tablet by mouth 2 (two) times daily., Disp: , Rfl:    potassium chloride SA (KLOR-CON M) 20 MEQ tablet, Take by mouth., Disp: , Rfl:    rosuvastatin (CRESTOR) 40 MG tablet, Take 1 tablet (40 mg total) by mouth daily., Disp: 90 tablet, Rfl: 2   sacubitril-valsartan (ENTRESTO) 49-51 MG, Take 1 tablet by mouth 2 (two) times daily. (Patient not taking: Reported on 07/28/2022), Disp: 180 tablet, Rfl: 0   spironolactone  (ALDACTONE) 25 MG tablet, TAKE 1/2 TABLET BY MOUTH DAILY, Disp: 45 tablet, Rfl: 0   spironolactone (ALDACTONE) 25 MG tablet, Take 1 tablet by mouth daily. 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   Hypertension    Non Hodgkin's lymphoma ([place])    TIA (transient ischemic attack)     Tobacco Use: Social History   Tobacco Use  Smoking Status Never  Smokeless Tobacco Never    Labs: Review Flowsheet       Latest Ref Rng & Units 10/02/2021  Labs for ITP Cardiac and Pulmonary Rehab  Cholestrol 0 - 200 mg/dL 154   LDL (calc) 0 - 99 mg/dL 89   HDL-C >40 mg/dL 35   Trlycerides <150 mg/dL 152   Hemoglobin A1c 4.8 - 5.6 % 5.4      Exercise Target Goals: Exercise Program Goal: Individual exercise prescription set using results from initial 6 min walk test and THRR while considering  patient's activity barriers and safety.   Exercise Prescription Goal: Initial exercise prescription builds to 30-45 minutes a day of aerobic activity, 2-3 days per week.  Home exercise guidelines will be given to patient during program as part of exercise prescription that the participant will acknowledge.   Education: Aerobic Exercise: - Group verbal and  visual presentation on the components of exercise prescription. Introduces F.I.T.T principle from ACSM for exercise prescriptions.  Reviews F.I.T.T. principles of aerobic exercise including progression. Written material given at graduation.   Education: Resistance Exercise: - Group verbal and visual presentation on the components of exercise prescription. Introduces F.I.T.T principle from ACSM for exercise prescriptions  Reviews F.I.T.T. principles of resistance exercise including progression. Written material given at graduation.    Education: Exercise & Equipment Safety: - Individual verbal instruction and demonstration of  equipment use and safety with use of the equipment. Flowsheet Row Cardiac Rehab from 07/29/2022 in [facility] Cardiac and Pulmonary Rehab  Date 07/28/22  Educator [facility]  Instruction Review Code 1- Verbalizes Understanding       Education: Exercise Physiology & General Exercise Guidelines: - Group verbal and written instruction with models to review the exercise physiology of the cardiovascular system and associated critical values. Provides general exercise guidelines with specific guidelines to those with heart or lung disease.    Education: Flexibility, Balance, Mind/Body Relaxation: - Group verbal and visual presentation with interactive activity on the components of exercise prescription. Introduces F.I.T.T principle from ACSM for exercise prescriptions. Reviews F.I.T.T. principles of flexibility and balance exercise training including progression. Also discusses the mind body connection.  Reviews various relaxation techniques to help reduce and manage stress (i.e. Deep breathing, progressive muscle relaxation, and visualization). Balance handout provided to take home. Written material given at graduation.   Activity Barriers & Risk Stratification:  Activity Barriers & Cardiac Risk Stratification - 07/29/22 1302       Activity Barriers & Cardiac Risk Stratification   Activity Barriers None    Cardiac Risk Stratification High             6 Minute Walk:  6 Minute Walk     Row Name 07/29/22 1300         6 Minute Walk   Phase Initial     Distance 1530 feet     Walk Time 6 minutes     # of Rest Breaks 0     MPH 2.9     METS 3.91     RPE 13     Perceived Dyspnea  1     VO2 Peak 13.69     Symptoms No     Resting HR 86 bpm     Resting BP 122/74     Resting Oxygen Saturation  98 %     Exercise Oxygen Saturation  during 6 min walk 100 %     Max Ex. HR 109 bpm     Max Ex. BP 128/78     2 Minute Post BP 114/70              Oxygen Initial Assessment:   Oxygen  Re-Evaluation:   Oxygen Discharge (Final Oxygen Re-Evaluation):   Initial Exercise Prescription:  Initial Exercise Prescription - 07/29/22 1300       Date of Initial Exercise RX and Referring Provider   Date 07/29/22    Referring Provider Emily Filbert MD      Oxygen   Maintain Oxygen Saturation 88% or higher      Treadmill   MPH 2.5    Grade 1    Minutes 15    METs 3.26      REL-XR   Level 1    Speed 60    Minutes 15    METs 3.91      T5 Nustep   Level 2  SPM 80    Minutes 15    METs 3.91      Prescription Details   Frequency (times per week) 3    Duration Progress to 30 minutes of continuous aerobic without signs/symptoms of physical distress      Intensity   THRR 40-80% of Max Heartrate 118-150    Ratings of Perceived Exertion 11-15    Perceived Dyspnea 0-4      Progression   Progression Continue to progress workloads to maintain intensity without signs/symptoms of physical distress.      Resistance Training   Training Prescription Yes    Weight 3 lb    Reps 10-15             Perform Capillary Blood Glucose checks as needed.  Exercise Prescription Changes:   Exercise Prescription Changes     Row Name 07/29/22 1300             Response to Exercise   Blood Pressure (Admit) 122/74       Blood Pressure (Exercise) 128/78       Blood Pressure (Exit) 114/70       Heart Rate (Admit) 86 bpm       Heart Rate (Exercise) 109 bpm       Heart Rate (Exit) 93 bpm       Oxygen Saturation (Admit) 98 %       Oxygen Saturation (Exercise) 100 %       Rating of Perceived Exertion (Exercise) 13       Perceived Dyspnea (Exercise) 1       Symptoms None       Comments 6MWT Results                Exercise Comments:   Exercise Goals and Review:   Exercise Goals     Row Name 07/29/22 1302             Exercise Goals   Increase Physical Activity Yes       Intervention Provide advice, education, support and counseling about physical  activity/exercise needs.;Develop an individualized exercise prescription for aerobic and resistive training based on initial evaluation findings, risk stratification, comorbidities and participant's personal goals.       Expected Outcomes Short Term: Attend rehab on a regular basis to increase amount of physical activity.;Long Term: Add in home exercise to make exercise part of routine and to increase amount of physical activity.;Long Term: Exercising regularly at least 3-5 days a week.       Increase Strength and Stamina Yes       Intervention Develop an individualized exercise prescription for aerobic and resistive training based on initial evaluation findings, risk stratification, comorbidities and participant's personal goals.;Provide advice, education, support and counseling about physical activity/exercise needs.       Expected Outcomes Long Term: Improve cardiorespiratory fitness, muscular endurance and strength as measured by increased METs and functional capacity (6MWT);Short Term: Perform resistance training exercises routinely during rehab and add in resistance training at home;Short Term: Increase workloads from initial exercise prescription for resistance, speed, and METs.       Able to understand and use rate of perceived exertion (RPE) scale Yes       Intervention Provide education and explanation on how to use RPE scale       Expected Outcomes Long Term:  Able to use RPE to guide intensity level when exercising independently;Short Term: Able to use RPE daily in rehab to express subjective intensity level  Able to understand and use Dyspnea scale Yes       Intervention Provide education and explanation on how to use Dyspnea scale       Expected Outcomes Short Term: Able to use Dyspnea scale daily in rehab to express subjective sense of shortness of breath during exertion;Long Term: Able to use Dyspnea scale to guide intensity level when exercising independently       Knowledge and  understanding of Target Heart Rate Range (THRR) Yes       Intervention Provide education and explanation of THRR including how the numbers were predicted and where they are located for reference       Expected Outcomes Short Term: Able to state/look up THRR;Short Term: Able to use daily as guideline for intensity in rehab;Long Term: Able to use THRR to govern intensity when exercising independently       Able to check pulse independently Yes       Intervention Provide education and demonstration on how to check pulse in carotid and radial arteries.;Review the importance of being able to check your own pulse for safety during independent exercise       Expected Outcomes Short Term: Able to explain why pulse checking is important during independent exercise;Long Term: Able to check pulse independently and accurately       Understanding of Exercise Prescription Yes       Intervention Provide education, explanation, and written materials on patient's individual exercise prescription       Expected Outcomes Short Term: Able to explain program exercise prescription;Long Term: Able to explain home exercise prescription to exercise independently                Exercise Goals Re-Evaluation :   Discharge Exercise Prescription (Final Exercise Prescription Changes):  Exercise Prescription Changes - 07/29/22 1300       Response to Exercise   Blood Pressure (Admit) 122/74    Blood Pressure (Exercise) 128/78    Blood Pressure (Exit) 114/70    Heart Rate (Admit) 86 bpm    Heart Rate (Exercise) 109 bpm    Heart Rate (Exit) 93 bpm    Oxygen Saturation (Admit) 98 %    Oxygen Saturation (Exercise) 100 %    Rating of Perceived Exertion (Exercise) 13    Perceived Dyspnea (Exercise) 1    Symptoms None    Comments 6MWT Results             Nutrition:  Target Goals: Understanding of nutrition guidelines, daily intake of sodium '1500mg'$ , cholesterol '200mg'$ , calories 30% from fat and 7% or less from  saturated fats, daily to have 5 or more servings of fruits and vegetables.  Education: All About Nutrition: -Group instruction provided by verbal, written material, interactive activities, discussions, models, and posters to present general guidelines for heart healthy nutrition including fat, fiber, MyPlate, the role of sodium in heart healthy nutrition, utilization of the nutrition label, and utilization of this knowledge for meal planning. Follow up email sent as well. Written material given at graduation. Flowsheet Row Cardiac Rehab from 07/29/2022 in [facility] Cardiac and Pulmonary Rehab  Education need identified 07/29/22       Biometrics:  Pre Biometrics - 07/29/22 1302       Pre Biometrics   Height 6' (1.829 m)    Weight 247 lb (112 kg)    Waist Circumference 43.5 inches    Hip Circumference 42.5 inches    Waist to Hip Ratio 1.02 %    BMI (  Calculated) 33.49    Single Leg Stand 30 seconds   L             Nutrition Therapy Plan and Nutrition Goals:  Nutrition Therapy & Goals - 07/29/22 1256       Intervention Plan   Intervention Prescribe, educate and counsel regarding individualized specific dietary modifications aiming towards targeted core components such as weight, hypertension, lipid management, diabetes, heart failure and other comorbidities.    Expected Outcomes Short Term Goal: Understand basic principles of dietary content, such as calories, fat, sodium, cholesterol and nutrients.;Short Term Goal: A plan has been developed with personal nutrition goals set during dietitian appointment.;Long Term Goal: Adherence to prescribed nutrition plan.             Nutrition Assessments:  MEDIFICTS Score Key: ?70 Need to make dietary changes  40-70 Heart Healthy Diet ? 40 Therapeutic Level Cholesterol Diet  Flowsheet Row Cardiac Rehab from 07/29/2022 in [facility] Cardiac and Pulmonary Rehab  Picture Your Plate Total Score on Admission 67      Picture Your Plate  Scores: <27 Unhealthy dietary pattern with much room for improvement. 41-50 Dietary pattern unlikely to meet recommendations for good health and room for improvement. 51-60 More healthful dietary pattern, with some room for improvement.  >60 Healthy dietary pattern, although there may be some specific behaviors that could be improved.    Nutrition Goals Re-Evaluation:   Nutrition Goals Discharge (Final Nutrition Goals Re-Evaluation):   Psychosocial: Target Goals: Acknowledge presence or absence of significant depression and/or stress, maximize coping skills, provide positive support system. Participant is able to verbalize types and ability to use techniques and skills needed for reducing stress and depression.   Education: Stress, Anxiety, and Depression - Group verbal and visual presentation to define topics covered.  Reviews how body is impacted by stress, anxiety, and depression.  Also discusses healthy ways to reduce stress and to treat/manage anxiety and depression.  Written material given at graduation.   Education: Sleep Hygiene -Provides group verbal and written instruction about how sleep can affect your health.  Define sleep hygiene, discuss sleep cycles and impact of sleep habits. Review good sleep hygiene tips.    Initial Review & Psychosocial Screening:  Initial Psych Review & Screening - 07/28/22 0942       Initial Review   Current issues with Current Sleep Concerns    Comments Rich cant sleep that well at this time due his procedure.      Family Dynamics   Good Support System? Yes    Comments He can look to his mother, sister and brother in law. His sone lives in [place] and is a phone call away.      Barriers   Psychosocial barriers to participate in program The patient should benefit from training in stress management and relaxation.;There are no identifiable barriers or psychosocial needs.      Screening Interventions   Interventions Encouraged to exercise;To  provide support and resources with identified psychosocial needs;Provide feedback about the scores to participant    Expected Outcomes Short Term goal: Utilizing psychosocial counselor, staff and physician to assist with identification of specific Stressors or current issues interfering with healing process. Setting desired goal for each stressor or current issue identified.;Long Term Goal: Stressors or current issues are controlled or eliminated.;Short Term goal: Identification and review with participant of any Quality of Life or Depression concerns found by scoring the questionnaire.;Long Term goal: The participant improves quality of Life and PHQ9 Scores  as seen by post scores and/or verbalization of changes             Quality of Life Scores:   Quality of Life - 07/29/22 1255       Quality of Life   Select Quality of Life      Quality of Life Scores   Health/Function Pre 26.87 %    Socioeconomic Pre 28.13 %    Psych/Spiritual Pre 29.14 %    Family Pre 27 %    GLOBAL Pre 27.63 %            Scores of 19 and below usually indicate a poorer quality of life in these areas.  A difference of  2-3 points is a clinically meaningful difference.  A difference of 2-3 points in the total score of the Quality of Life Index has been associated with significant improvement in overall quality of life, self-image, physical symptoms, and general health in studies assessing change in quality of life.  PHQ-9: Review Flowsheet       07/29/2022  Depression screen PHQ 2/9  Decreased Interest 0  Down, Depressed, Hopeless 0  PHQ - 2 Score 0  Altered sleeping 1  Tired, decreased energy 1  Change in appetite 1  Feeling bad or failure about yourself  0  Trouble concentrating 0  Moving slowly or fidgety/restless 0  Suicidal thoughts 0  PHQ-9 Score 3  Difficult doing work/chores Somewhat difficult   Interpretation of Total Score  Total Score Depression Severity:  1-4 = Minimal depression, 5-9  = Mild depression, 10-14 = Moderate depression, 15-19 = Moderately severe depression, 20-27 = Severe depression   Psychosocial Evaluation and Intervention:  Psychosocial Evaluation - 07/28/22 0943       Psychosocial Evaluation & Interventions   Interventions Encouraged to exercise with the program and follow exercise prescription;Relaxation education;Stress management education    Comments He can look to his mother, sister and brother in law. His sone lives in [place] and is a phone call away.Rich cant sleep that well at this time due his procedure.    Expected Outcomes Short: Start HeartTrack to help with mood. Long: Maintain a healthy mental state    Continue Psychosocial Services  Follow up required by staff             Psychosocial Re-Evaluation:   Psychosocial Discharge (Final Psychosocial Re-Evaluation):   Vocational Rehabilitation: Provide vocational rehab assistance to qualifying candidates.   Vocational Rehab Evaluation & Intervention:   Education: Education Goals: Education classes will be provided on a variety of topics geared toward better understanding of heart health and risk factor modification. Participant will state understanding/return demonstration of topics presented as noted by education test scores.  Learning Barriers/Preferences:  Learning Barriers/Preferences - 07/28/22 0941       Learning Barriers/Preferences   Learning Barriers None    Learning Preferences None             General Cardiac Education Topics:  AED/CPR: - Group verbal and written instruction with the use of models to demonstrate the basic use of the AED with the basic ABC's of resuscitation.   Anatomy and Cardiac Procedures: - Group verbal and visual presentation and models provide information about basic cardiac anatomy and function. Reviews the testing methods done to diagnose heart disease and the outcomes of the test results. Describes the treatment choices: Medical  Management, Angioplasty, or Coronary Bypass Surgery for treating various heart conditions including Myocardial Infarction, Angina, Valve Disease, and Cardiac Arrhythmias.  Written material given at graduation.   Medication Safety: - Group verbal and visual instruction to review commonly prescribed medications for heart and lung disease. Reviews the medication, class of the drug, and side effects. Includes the steps to properly store meds and maintain the prescription regimen.  Written material given at graduation.   Intimacy: - Group verbal instruction through game format to discuss how heart and lung disease can affect sexual intimacy. Written material given at graduation..   Know Your Numbers and Heart Failure: - Group verbal and visual instruction to discuss disease risk factors for cardiac and pulmonary disease and treatment options.  Reviews associated critical values for Overweight/Obesity, Hypertension, Cholesterol, and Diabetes.  Discusses basics of heart failure: signs/symptoms and treatments.  Introduces Heart Failure Zone chart for action plan for heart failure.  Written material given at graduation.   Infection Prevention: - Provides verbal and written material to individual with discussion of infection control including proper hand washing and proper equipment cleaning during exercise session. Flowsheet Row Cardiac Rehab from 07/29/2022 in [facility] Cardiac and Pulmonary Rehab  Date 07/28/22  Educator [facility]  Instruction Review Code 1- Verbalizes Understanding       Falls Prevention: - Provides verbal and written material to individual with discussion of falls prevention and safety. Flowsheet Row Cardiac Rehab from 07/29/2022 in [facility] Cardiac and Pulmonary Rehab  Date 07/28/22  Educator [facility]  Instruction Review Code 1- Verbalizes Understanding       Other: -Provides group and verbal instruction on various topics (see comments)   Knowledge Questionnaire Score:  Knowledge  Questionnaire Score - 07/29/22 1256       Knowledge Questionnaire Score   Pre Score 25/26             Core Components/Risk Factors/Patient Goals at Admission:  Personal Goals and Risk Factors at Admission - 07/29/22 1257       Core Components/Risk Factors/Patient Goals on Admission    Weight Management Yes;Weight Loss    Intervention Weight Management: Develop a combined nutrition and exercise program designed to reach desired caloric intake, while maintaining appropriate intake of nutrient and fiber, sodium and fats, and appropriate energy expenditure required for the weight goal.;Weight Management: Provide education and appropriate resources to help participant work on and attain dietary goals.;Weight Management/Obesity: Establish reasonable short term and long term weight goals.;Obesity: Provide education and appropriate resources to help participant work on and attain dietary goals.    Admit Weight 247 lb (112 kg)    Goal Weight: Short Term 240 lb (108.9 kg)    Goal Weight: Long Term 220 lb (99.8 kg)    Expected Outcomes Short Term: Continue to assess and modify interventions until short term weight is achieved;Long Term: Adherence to nutrition and physical activity/exercise program aimed toward attainment of established weight goal;Weight Loss: Understanding of general recommendations for a balanced deficit meal plan, which promotes 1-2 lb weight loss per week and includes a negative energy balance of [phone] kcal/d;Understanding recommendations for meals to include 15-35% energy as protein, 25-35% energy from fat, 35-60% energy from carbohydrates, less than '200mg'$  of dietary cholesterol, 20-35 gm of total fiber daily;Understanding of distribution of calorie intake throughout the day with the consumption of 4-5 meals/snacks    Heart Failure Yes    Intervention Provide a combined exercise and nutrition program that is supplemented with education, support and counseling about heart  failure. Directed toward relieving symptoms such as shortness of breath, decreased exercise tolerance, and extremity edema.    Expected Outcomes  Improve functional capacity of life;Short term: Attendance in program 2-3 days a week with increased exercise capacity. Reported lower sodium intake. Reported increased fruit and vegetable intake. Reports medication compliance.;Short term: Daily weights obtained and reported for increase. Utilizing diuretic protocols set by physician.;Long term: Adoption of self-care skills and reduction of barriers for early signs and symptoms recognition and intervention leading to self-care maintenance.    Hypertension Yes    Intervention Provide education on lifestyle modifcations including regular physical activity/exercise, weight management, moderate sodium restriction and increased consumption of fresh fruit, vegetables, and low fat dairy, alcohol moderation, and smoking cessation.;Monitor prescription use compliance.    Expected Outcomes Short Term: Continued assessment and intervention until BP is < 140/63m HG in hypertensive participants. < 130/860mHG in hypertensive participants with diabetes, heart failure or chronic kidney disease.;Long Term: Maintenance of blood pressure at goal levels.    Lipids Yes    Intervention Provide education and support for participant on nutrition & aerobic/resistive exercise along with prescribed medications to achieve LDL '70mg'$ , HDL >'40mg'$ .    Expected Outcomes Short Term: Participant states understanding of desired cholesterol values and is compliant with medications prescribed. Participant is following exercise prescription and nutrition guidelines.;Long Term: Cholesterol controlled with medications as prescribed, with individualized exercise RX and with personalized nutrition plan. Value goals: LDL < '70mg'$ , HDL > 40 mg.             Education:Diabetes - Individual verbal and written instruction to review signs/symptoms of  diabetes, desired ranges of glucose level fasting, after meals and with exercise. Acknowledge that pre and post exercise glucose checks will be done for 3 sessions at entry of program.   Core Components/Risk Factors/Patient Goals Review:    Core Components/Risk Factors/Patient Goals at Discharge (Final Review):    ITP Comments:  ITP Comments     Row Name 07/28/22 0940 07/29/22 1253         ITP Comments Virtual Visit completed. Patient informed on EP and RD appointment and 6 Minute walk test. Patient also informed of patient health questionnaires on My Chart. Patient Verbalizes understanding. [facility] Completed 6MWT and gym orientation. Initial ITP created and sent for review to Dr. MaEmily FilbertMedical Director.               Comments: Initial ITP

## 2022-07-30 ENCOUNTER — Encounter: Payer: Commercial Managed Care - PPO | Admitting: *Deleted

## 2022-07-30 DIAGNOSIS — Z951 Presence of aortocoronary bypass graft: Secondary | ICD-10-CM | POA: Diagnosis not present

## 2022-07-30 NOTE — Progress Notes (Signed)
Daily Session Note  Patient Details  Name: Ronnie Mcintyre MRN: [id number] Date of Birth: 12-03-1965 Referring Provider:   Flowsheet Row Cardiac Rehab from 07/29/2022 in [facility] Cardiac and Pulmonary Rehab  Referring Provider Emily Filbert MD       Encounter Date: 07/30/2022  Check In:  Session Check In - 07/30/22 0723       Check-In   Supervising physician immediately available to respond to emergencies See telemetry face sheet for immediately available ER MD    Location [facility]-Cardiac & Pulmonary Rehab    Staff Present Darlyne Russian, RN, Dimple Nanas, BS, Exercise Physiologist;Joseph Tessie Fass, [place]    Virtual Visit No    Medication changes reported     No    Fall or balance concerns reported    No    Warm-up and Cool-down Performed on first and last piece of equipment    Resistance Training Performed Yes    VAD Patient? No    PAD/SET Patient? No      Pain Assessment   Currently in Pain? No/denies                Social History   Tobacco Use  Smoking Status Never  Smokeless Tobacco Never    Goals Met:  Independence with exercise equipment Exercise tolerated well No report of concerns or symptoms today Strength training completed today  Goals Unmet:  Not Applicable  Comments: First full day of exercise!  Patient was oriented to gym and equipment including functions, settings, policies, and procedures.  Patient's individual exercise prescription and treatment plan were reviewed.  All starting workloads were established based on the results of the 6 minute walk test done at initial orientation visit.  The plan for exercise progression was also introduced and progression will be customized based on patient's performance and goals.    Dr. Emily Filbert is Medical Director for [place].  Dr. Ottie Glazier is Medical Director for [facility] Pulmonary Rehabilitation.

## 2022-08-01 ENCOUNTER — Encounter: Payer: Commercial Managed Care - PPO | Admitting: *Deleted

## 2022-08-01 DIAGNOSIS — Z951 Presence of aortocoronary bypass graft: Secondary | ICD-10-CM | POA: Diagnosis not present

## 2022-08-01 NOTE — Progress Notes (Signed)
Daily Session Note  Patient Details  Name: Ronnie Mcintyre MRN: [id number] Date of Birth: 1966-02-20 Referring Provider:   Flowsheet Row Cardiac Rehab from 07/29/2022 in [facility] Cardiac and Pulmonary Rehab  Referring Provider Emily Filbert MD       Encounter Date: 08/01/2022  Check In:  Session Check In - 08/01/22 0804       Check-In   Supervising physician immediately available to respond to emergencies See telemetry face sheet for immediately available ER MD    Location [facility]-Cardiac & Pulmonary Rehab    Staff Present Heath Lark, RN, BSN, CCRP;Jessica Dix, MA, RCEP, CCRP, CCET;Joseph [place], [place]    Virtual Visit No    Medication changes reported     No    Fall or balance concerns reported    No    Warm-up and Cool-down Performed on first and last piece of equipment    Resistance Training Performed Yes    VAD Patient? No    PAD/SET Patient? No      Pain Assessment   Currently in Pain? No/denies                Social History   Tobacco Use  Smoking Status Never  Smokeless Tobacco Never    Goals Met:  Independence with exercise equipment Exercise tolerated well No report of concerns or symptoms today  Goals Unmet:  Not Applicable  Comments: Pt able to follow exercise prescription today without complaint.  Will continue to monitor for progression.    Dr. Emily Filbert is Medical Director for [place].  Dr. Ottie Glazier is Medical Director for [facility] Pulmonary Rehabilitation.

## 2022-08-06 ENCOUNTER — Encounter: Payer: Commercial Managed Care - PPO | Admitting: *Deleted

## 2022-08-06 DIAGNOSIS — Z951 Presence of aortocoronary bypass graft: Secondary | ICD-10-CM | POA: Diagnosis not present

## 2022-08-06 NOTE — Progress Notes (Signed)
Daily Session Note  Patient Details  Name: Ronnie Mcintyre MRN: [id number] Date of Birth: Nov 23, 1965 Referring Provider:   Flowsheet Row Cardiac Rehab from 07/29/2022 in [facility] Cardiac and Pulmonary Rehab  Referring Provider Emily Filbert MD       Encounter Date: 08/06/2022  Check In:  Session Check In - 08/06/22 0719       Check-In   Supervising physician immediately available to respond to emergencies See telemetry face sheet for immediately available ER MD    Location [facility]-Cardiac & Pulmonary Rehab    Staff Present Darlyne Russian, RN, Dimple Nanas, BS, Exercise Physiologist;Joseph Tessie Fass, [place]    Virtual Visit No    Medication changes reported     No    Fall or balance concerns reported    No    Warm-up and Cool-down Performed on first and last piece of equipment    Resistance Training Performed Yes    VAD Patient? No    PAD/SET Patient? No      Pain Assessment   Currently in Pain? No/denies                Social History   Tobacco Use  Smoking Status Never  Smokeless Tobacco Never    Goals Met:  Independence with exercise equipment Exercise tolerated well No report of concerns or symptoms today Strength training completed today  Goals Unmet:  Not Applicable  Comments: Pt able to follow exercise prescription today without complaint.  Will continue to monitor for progression.    Dr. Emily Filbert is Medical Director for [place].  Dr. Ottie Glazier is Medical Director for [facility] Pulmonary Rehabilitation.

## 2022-08-11 ENCOUNTER — Encounter: Payer: Commercial Managed Care - PPO | Admitting: *Deleted

## 2022-08-11 DIAGNOSIS — Z951 Presence of aortocoronary bypass graft: Secondary | ICD-10-CM

## 2022-08-11 NOTE — Progress Notes (Signed)
Daily Session Note  Patient Details  Name: DAWID DUPRIEST MRN: [id number] Date of Birth: [birth year] Referring Provider:   Flowsheet Row Cardiac Rehab from 07/29/2022 in [facility] Cardiac and Pulmonary Rehab  Referring Provider Emily Filbert MD       Encounter Date: 08/11/2022  Check In:  Session Check In - 08/11/22 0824       Check-In   Supervising physician immediately available to respond to emergencies See telemetry face sheet for immediately available ER MD    Location [facility]-Cardiac & Pulmonary Rehab    Staff Present Darlyne Russian, RN, Doyce Para, BS, ACSM CEP, Exercise Physiologist;Joseph Tessie Fass, [place]    Virtual Visit No    Medication changes reported     No    Fall or balance concerns reported    No    Warm-up and Cool-down Performed on first and last piece of equipment    Resistance Training Performed Yes    VAD Patient? No    PAD/SET Patient? No      Pain Assessment   Currently in Pain? No/denies                Social History   Tobacco Use  Smoking Status Never  Smokeless Tobacco Never    Goals Met:  Independence with exercise equipment Exercise tolerated well No report of concerns or symptoms today Strength training completed today  Goals Unmet:  Not Applicable  Comments: Pt able to follow exercise prescription today without complaint.  Will continue to monitor for progression.    Dr. Emily Filbert is Medical Director for [place].  Dr. Ottie Glazier is Medical Director for [facility] Pulmonary Rehabilitation.

## 2022-08-13 ENCOUNTER — Encounter: Payer: Self-pay | Admitting: *Deleted

## 2022-08-13 ENCOUNTER — Encounter: Payer: Commercial Managed Care - PPO | Admitting: *Deleted

## 2022-08-13 DIAGNOSIS — Z951 Presence of aortocoronary bypass graft: Secondary | ICD-10-CM

## 2022-08-13 NOTE — Progress Notes (Signed)
Daily Session Note  Patient Details  Name: Ronnie Mcintyre MRN: [id number] Date of Birth: 13-Jun-1966 Referring Provider:   Flowsheet Row Cardiac Rehab from 07/29/2022 in [facility] Cardiac and Pulmonary Rehab  Referring Provider Emily Filbert MD       Encounter Date: 08/13/2022  Check In:  Session Check In - 08/13/22 0739       Check-In   Supervising physician immediately available to respond to emergencies See telemetry face sheet for immediately available ER MD    Location [facility]-Cardiac & Pulmonary Rehab    Staff Present Darlyne Russian, RN, ADN;Joseph Tessie Fass, RCP,RRT,BSRT;Noah Tickle, BS, Exercise Physiologist    Virtual Visit No    Medication changes reported     No    Fall or balance concerns reported    No    Warm-up and Cool-down Performed on first and last piece of equipment    Resistance Training Performed Yes    VAD Patient? No    PAD/SET Patient? No      Pain Assessment   Currently in Pain? No/denies                Social History   Tobacco Use  Smoking Status Never  Smokeless Tobacco Never    Goals Met:  Independence with exercise equipment Exercise tolerated well No report of concerns or symptoms today Strength training completed today  Goals Unmet:  Not Applicable  Comments: Pt able to follow exercise prescription today without complaint.  Will continue to monitor for progression.    Dr. Emily Filbert is Medical Director for [place].  Dr. Ottie Glazier is Medical Director for [facility] Pulmonary Rehabilitation.

## 2022-08-13 NOTE — Progress Notes (Signed)
Cardiac Individual Treatment Plan  Patient Details  Name: MANVEL MORONTA MRN: [id number] Date of Birth: 1966-03-16 Referring Provider:   Flowsheet Row Cardiac Rehab from 07/29/2022 in [facility] Cardiac and Pulmonary Rehab  Referring Provider Emily Filbert MD       Initial Encounter Date:  Flowsheet Row Cardiac Rehab from 07/29/2022 in [facility] Cardiac and Pulmonary Rehab  Date 07/29/22       Visit Diagnosis: S/P CABG x 3  Patient's Home Medications on Admission:  Current Outpatient Medications:    acetaminophen (TYLENOL) 325 MG tablet, Take 2 tablets (650 mg total) by mouth every 6 (six) hours as needed for mild pain (or Fever >/= 101)., Disp: , Rfl:    aspirin EC 81 MG tablet, Take 1 tablet (81 mg total) by mouth daily. Swallow whole., Disp: , Rfl:    carvedilol (COREG) 12.5 MG tablet, TAKE 1 TABLET (12.5MG TOTAL) BY MOUTH TWICE A DAY WITH MEALS (Patient not taking: Reported on 07/28/2022), Disp: 180 tablet, Rfl: 2   clopidogrel (PLAVIX) 75 MG tablet, Take 1 tablet (75 mg total) by mouth daily. (Patient not taking: Reported on 04/24/2022), Disp: 30 tablet, Rfl: 2   ezetimibe (ZETIA) 10 MG tablet, Take 1 tablet (10 mg total) by mouth daily. (Patient not taking: Reported on 05/23/2022), Disp: 90 tablet, Rfl: 2   furosemide (LASIX) 40 MG tablet, Take by mouth., Disp: , Rfl:    isosorbide mononitrate (IMDUR) 30 MG 24 hr tablet, Take 1 tablet (30 mg total) by mouth daily. (Patient not taking: Reported on 07/28/2022), Disp: 30 tablet, Rfl: 6   metoprolol tartrate (LOPRESSOR) 50 MG tablet, Take 1 tablet by mouth 2 (two) times daily., Disp: , Rfl:    potassium chloride SA (KLOR-CON M) 20 MEQ tablet, Take by mouth., Disp: , Rfl:    rosuvastatin (CRESTOR) 40 MG tablet, Take 1 tablet (40 mg total) by mouth daily., Disp: 90 tablet, Rfl: 2   sacubitril-valsartan (ENTRESTO) 49-51 MG, Take 1 tablet by mouth 2 (two) times daily. (Patient not taking: Reported on 07/28/2022), Disp: 180 tablet, Rfl: 0   spironolactone  (ALDACTONE) 25 MG tablet, TAKE 1/2 TABLET BY MOUTH DAILY, Disp: 45 tablet, Rfl: 0   spironolactone (ALDACTONE) 25 MG tablet, Take 1 tablet by mouth daily. 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   Hypertension    Non Hodgkin's lymphoma ([place])    TIA (transient ischemic attack)     Tobacco Use: Social History   Tobacco Use  Smoking Status Never  Smokeless Tobacco Never    Labs: Review Flowsheet       Latest Ref Rng & Units 10/02/2021  Labs for ITP Cardiac and Pulmonary Rehab  Cholestrol 0 - 200 mg/dL 154   LDL (calc) 0 - 99 mg/dL 89   HDL-C >40 mg/dL 35   Trlycerides <150 mg/dL 152   Hemoglobin A1c 4.8 - 5.6 % 5.4      Exercise Target Goals: Exercise Program Goal: Individual exercise prescription set using results from initial 6 min walk test and THRR while considering  patient's activity barriers and safety.   Exercise Prescription Goal: Initial exercise prescription builds to 30-45 minutes a day of aerobic activity, 2-3 days per week.  Home exercise guidelines will be given to patient during program as part of exercise prescription that the participant will acknowledge.   Education: Aerobic Exercise: - Group verbal and  visual presentation on the components of exercise prescription. Introduces F.I.T.T principle from ACSM for exercise prescriptions.  Reviews F.I.T.T. principles of aerobic exercise including progression. Written material given at graduation. Flowsheet Row Cardiac Rehab from 08/13/2022 in [facility] Cardiac and Pulmonary Rehab  Date 08/06/22  Educator [facility]  Instruction Review Code 1- Verbalizes Understanding       Education: Resistance Exercise: - Group verbal and visual presentation on the components of exercise prescription. Introduces F.I.T.T principle from ACSM for exercise prescriptions  Reviews F.I.T.T. principles of resistance  exercise including progression. Written material given at graduation. Flowsheet Row Cardiac Rehab from 08/13/2022 in [facility] Cardiac and Pulmonary Rehab  Date 08/13/22  Educator [facility]  Instruction Review Code 1- Verbalizes Understanding        Education: Exercise & Equipment Safety: - Individual verbal instruction and demonstration of equipment use and safety with use of the equipment. Flowsheet Row Cardiac Rehab from 08/13/2022 in [facility] Cardiac and Pulmonary Rehab  Date 07/28/22  Educator [facility]  Instruction Review Code 1- Verbalizes Understanding       Education: Exercise Physiology & General Exercise Guidelines: - Group verbal and written instruction with models to review the exercise physiology of the cardiovascular system and associated critical values. Provides general exercise guidelines with specific guidelines to those with heart or lung disease.  Flowsheet Row Cardiac Rehab from 08/13/2022 in [facility] Cardiac and Pulmonary Rehab  Date 07/30/22  Educator [facility]  Instruction Review Code 1- Verbalizes Understanding       Education: Flexibility, Balance, Mind/Body Relaxation: - Group verbal and visual presentation with interactive activity on the components of exercise prescription. Introduces F.I.T.T principle from ACSM for exercise prescriptions. Reviews F.I.T.T. principles of flexibility and balance exercise training including progression. Also discusses the mind body connection.  Reviews various relaxation techniques to help reduce and manage stress (i.e. Deep breathing, progressive muscle relaxation, and visualization). Balance handout provided to take home. Written material given at graduation. Flowsheet Row Cardiac Rehab from 08/13/2022 in [facility] Cardiac and Pulmonary Rehab  Date 08/13/22  Educator [facility]  Instruction Review Code 1- Verbalizes Understanding       Activity Barriers & Risk Stratification:  Activity Barriers & Cardiac Risk Stratification - 07/29/22 1302       Activity  Barriers & Cardiac Risk Stratification   Activity Barriers None    Cardiac Risk Stratification High             6 Minute Walk:  6 Minute Walk     Row Name 07/29/22 1300         6 Minute Walk   Phase Initial     Distance 1530 feet     Walk Time 6 minutes     # of Rest Breaks 0     MPH 2.9     METS 3.91     RPE 13     Perceived Dyspnea  1     VO2 Peak 13.69     Symptoms No     Resting HR 86 bpm     Resting BP 122/74     Resting Oxygen Saturation  98 %     Exercise Oxygen Saturation  during 6 min walk 100 %     Max Ex. HR 109 bpm     Max Ex. BP 128/78     2 Minute Post BP 114/70              Oxygen Initial Assessment:   Oxygen Re-Evaluation:   Oxygen Discharge (Final Oxygen Re-Evaluation):  Initial Exercise Prescription:  Initial Exercise Prescription - 07/29/22 1300       Date of Initial Exercise RX and Referring Provider   Date 07/29/22    Referring Provider Emily Filbert MD      Oxygen   Maintain Oxygen Saturation 88% or higher      Treadmill   MPH 2.5    Grade 1    Minutes 15    METs 3.26      REL-XR   Level 1    Speed 60    Minutes 15    METs 3.91      T5 Nustep   Level 2    SPM 80    Minutes 15    METs 3.91      Prescription Details   Frequency (times per week) 3    Duration Progress to 30 minutes of continuous aerobic without signs/symptoms of physical distress      Intensity   THRR 40-80% of Max Heartrate 118-150    Ratings of Perceived Exertion 11-15    Perceived Dyspnea 0-4      Progression   Progression Continue to progress workloads to maintain intensity without signs/symptoms of physical distress.      Resistance Training   Training Prescription Yes    Weight 3 lb    Reps 10-15             Perform Capillary Blood Glucose checks as needed.  Exercise Prescription Changes:   Exercise Prescription Changes     Row Name 07/29/22 1300 08/05/22 0800           Response to Exercise   Blood Pressure  (Admit) 122/74 118/70      Blood Pressure (Exercise) 128/78 138/74      Blood Pressure (Exit) 114/70 110/60      Heart Rate (Admit) 86 bpm 98 bpm      Heart Rate (Exercise) 109 bpm 122 bpm      Heart Rate (Exit) 93 bpm --      Oxygen Saturation (Admit) 98 % --      Oxygen Saturation (Exercise) 100 % --      Rating of Perceived Exertion (Exercise) 13 14      Perceived Dyspnea (Exercise) 1 --      Symptoms None none      Comments 6MWT Results First two sessions of exercise      Duration -- Continue with 30 min of aerobic exercise without signs/symptoms of physical distress.      Intensity -- THRR unchanged        Progression   Progression -- Continue to progress workloads to maintain intensity without signs/symptoms of physical distress.      Average METs -- 3.15        Resistance Training   Training Prescription -- Yes      Weight -- 3 lb      Reps -- 10-15        Interval Training   Interval Training -- No        Treadmill   MPH -- 3      Grade -- 1.5      Minutes -- 15      METs -- 3.92        REL-XR   Level -- 4      Minutes -- 15      METs -- 3.5        T5 Nustep   Level -- 2  Minutes -- 15      METs -- 1.9        Oxygen   Maintain Oxygen Saturation -- 88% or higher               Exercise Comments:   Exercise Comments     Row Name 07/30/22 0726           Exercise Comments First full day of exercise!  Patient was oriented to gym and equipment including functions, settings, policies, and procedures.  Patient's individual exercise prescription and treatment plan were reviewed.  All starting workloads were established based on the results of the 6 minute walk test done at initial orientation visit.  The plan for exercise progression was also introduced and progression will be customized based on patient's performance and goals.                Exercise Goals and Review:   Exercise Goals     Row Name 07/29/22 1302             Exercise  Goals   Increase Physical Activity Yes       Intervention Provide advice, education, support and counseling about physical activity/exercise needs.;Develop an individualized exercise prescription for aerobic and resistive training based on initial evaluation findings, risk stratification, comorbidities and participant's personal goals.       Expected Outcomes Short Term: Attend rehab on a regular basis to increase amount of physical activity.;Long Term: Add in home exercise to make exercise part of routine and to increase amount of physical activity.;Long Term: Exercising regularly at least 3-5 days a week.       Increase Strength and Stamina Yes       Intervention Develop an individualized exercise prescription for aerobic and resistive training based on initial evaluation findings, risk stratification, comorbidities and participant's personal goals.;Provide advice, education, support and counseling about physical activity/exercise needs.       Expected Outcomes Long Term: Improve cardiorespiratory fitness, muscular endurance and strength as measured by increased METs and functional capacity (6MWT);Short Term: Perform resistance training exercises routinely during rehab and add in resistance training at home;Short Term: Increase workloads from initial exercise prescription for resistance, speed, and METs.       Able to understand and use rate of perceived exertion (RPE) scale Yes       Intervention Provide education and explanation on how to use RPE scale       Expected Outcomes Long Term:  Able to use RPE to guide intensity level when exercising independently;Short Term: Able to use RPE daily in rehab to express subjective intensity level       Able to understand and use Dyspnea scale Yes       Intervention Provide education and explanation on how to use Dyspnea scale       Expected Outcomes Short Term: Able to use Dyspnea scale daily in rehab to express subjective sense of shortness of breath during  exertion;Long Term: Able to use Dyspnea scale to guide intensity level when exercising independently       Knowledge and understanding of Target Heart Rate Range (THRR) Yes       Intervention Provide education and explanation of THRR including how the numbers were predicted and where they are located for reference       Expected Outcomes Short Term: Able to state/look up THRR;Short Term: Able to use daily as guideline for intensity in rehab;Long Term: Able to use THRR to govern intensity when exercising  independently       Able to check pulse independently Yes       Intervention Provide education and demonstration on how to check pulse in carotid and radial arteries.;Review the importance of being able to check your own pulse for safety during independent exercise       Expected Outcomes Short Term: Able to explain why pulse checking is important during independent exercise;Long Term: Able to check pulse independently and accurately       Understanding of Exercise Prescription Yes       Intervention Provide education, explanation, and written materials on patient's individual exercise prescription       Expected Outcomes Short Term: Able to explain program exercise prescription;Long Term: Able to explain home exercise prescription to exercise independently                Exercise Goals Re-Evaluation :  Exercise Goals Re-Evaluation     Row Name 07/30/22 0726 08/05/22 0841           Exercise Goal Re-Evaluation   Exercise Goals Review Able to understand and use rate of perceived exertion (RPE) scale;Able to understand and use Dyspnea scale;Knowledge and understanding of Target Heart Rate Range (THRR);Understanding of Exercise Prescription Increase Physical Activity;Increase Strength and Stamina;Understanding of Exercise Prescription      Comments Reviewed RPE scale, THR and program prescription with pt today.  Pt voiced understanding and was given a copy of goals to take home. Denice Paradise is off to  a good start in rehab. During his first two sessions he had an average MET level of 3.15 METs. He also was able to increase his workload on the treadmill from his initial prescription up to 3 mph with an incline of 1.5%. He was able to work at level 2 on the T5 and level 4 on the XR as well. We will continue to monitor his progress in the program.      Expected Outcomes Short: Use RPE daily to regulate intensity. Long: Follow program prescription in THR. Short: Increase workload on the T5. Long: Continue to improve strength and stamina.               Discharge Exercise Prescription (Final Exercise Prescription Changes):  Exercise Prescription Changes - 08/05/22 0800       Response to Exercise   Blood Pressure (Admit) 118/70    Blood Pressure (Exercise) 138/74    Blood Pressure (Exit) 110/60    Heart Rate (Admit) 98 bpm    Heart Rate (Exercise) 122 bpm    Rating of Perceived Exertion (Exercise) 14    Symptoms none    Comments First two sessions of exercise    Duration Continue with 30 min of aerobic exercise without signs/symptoms of physical distress.    Intensity THRR unchanged      Progression   Progression Continue to progress workloads to maintain intensity without signs/symptoms of physical distress.    Average METs 3.15      Resistance Training   Training Prescription Yes    Weight 3 lb    Reps 10-15      Interval Training   Interval Training No      Treadmill   MPH 3    Grade 1.5    Minutes 15    METs 3.92      REL-XR   Level 4    Minutes 15    METs 3.5      T5 Nustep   Level 2  Minutes 15    METs 1.9      Oxygen   Maintain Oxygen Saturation 88% or higher             Nutrition:  Target Goals: Understanding of nutrition guidelines, daily intake of sodium '1500mg'$ , cholesterol '200mg'$ , calories 30% from fat and 7% or less from saturated fats, daily to have 5 or more servings of fruits and vegetables.  Education: All About Nutrition: -Group  instruction provided by verbal, written material, interactive activities, discussions, models, and posters to present general guidelines for heart healthy nutrition including fat, fiber, MyPlate, the role of sodium in heart healthy nutrition, utilization of the nutrition label, and utilization of this knowledge for meal planning. Follow up email sent as well. Written material given at graduation. Flowsheet Row Cardiac Rehab from 08/13/2022 in [facility] Cardiac and Pulmonary Rehab  Education need identified 07/29/22       Biometrics:  Pre Biometrics - 07/29/22 1302       Pre Biometrics   Height 6' (1.829 m)    Weight 247 lb (112 kg)    Waist Circumference 43.5 inches    Hip Circumference 42.5 inches    Waist to Hip Ratio 1.02 %    BMI (Calculated) 33.49    Single Leg Stand 30 seconds   L             Nutrition Therapy Plan and Nutrition Goals:  Nutrition Therapy & Goals - 07/29/22 1256       Intervention Plan   Intervention Prescribe, educate and counsel regarding individualized specific dietary modifications aiming towards targeted core components such as weight, hypertension, lipid management, diabetes, heart failure and other comorbidities.    Expected Outcomes Short Term Goal: Understand basic principles of dietary content, such as calories, fat, sodium, cholesterol and nutrients.;Short Term Goal: A plan has been developed with personal nutrition goals set during dietitian appointment.;Long Term Goal: Adherence to prescribed nutrition plan.             Nutrition Assessments:  MEDIFICTS Score Key: ?70 Need to make dietary changes  40-70 Heart Healthy Diet ? 40 Therapeutic Level Cholesterol Diet  Flowsheet Row Cardiac Rehab from 07/29/2022 in [facility] Cardiac and Pulmonary Rehab  Picture Your Plate Total Score on Admission 67      Picture Your Plate Scores: <68 Unhealthy dietary pattern with much room for improvement. 41-50 Dietary pattern unlikely to meet recommendations  for good health and room for improvement. 51-60 More healthful dietary pattern, with some room for improvement.  >60 Healthy dietary pattern, although there may be some specific behaviors that could be improved.    Nutrition Goals Re-Evaluation:   Nutrition Goals Discharge (Final Nutrition Goals Re-Evaluation):   Psychosocial: Target Goals: Acknowledge presence or absence of significant depression and/or stress, maximize coping skills, provide positive support system. Participant is able to verbalize types and ability to use techniques and skills needed for reducing stress and depression.   Education: Stress, Anxiety, and Depression - Group verbal and visual presentation to define topics covered.  Reviews how body is impacted by stress, anxiety, and depression.  Also discusses healthy ways to reduce stress and to treat/manage anxiety and depression.  Written material given at graduation.   Education: Sleep Hygiene -Provides group verbal and written instruction about how sleep can affect your health.  Define sleep hygiene, discuss sleep cycles and impact of sleep habits. Review good sleep hygiene tips.    Initial Review & Psychosocial Screening:  Initial Psych Review & Screening -  07/28/22 0942       Initial Review   Current issues with Current Sleep Concerns    Comments Rich cant sleep that well at this time due his procedure.      Family Dynamics   Good Support System? Yes    Comments He can look to his mother, sister and brother in law. His sone lives in [place] and is a phone call away.      Barriers   Psychosocial barriers to participate in program The patient should benefit from training in stress management and relaxation.;There are no identifiable barriers or psychosocial needs.      Screening Interventions   Interventions Encouraged to exercise;To provide support and resources with identified psychosocial needs;Provide feedback about the scores to participant    Expected  Outcomes Short Term goal: Utilizing psychosocial counselor, staff and physician to assist with identification of specific Stressors or current issues interfering with healing process. Setting desired goal for each stressor or current issue identified.;Long Term Goal: Stressors or current issues are controlled or eliminated.;Short Term goal: Identification and review with participant of any Quality of Life or Depression concerns found by scoring the questionnaire.;Long Term goal: The participant improves quality of Life and PHQ9 Scores as seen by post scores and/or verbalization of changes             Quality of Life Scores:   Quality of Life - 07/29/22 1255       Quality of Life   Select Quality of Life      Quality of Life Scores   Health/Function Pre 26.87 %    Socioeconomic Pre 28.13 %    Psych/Spiritual Pre 29.14 %    Family Pre 27 %    GLOBAL Pre 27.63 %            Scores of 19 and below usually indicate a poorer quality of life in these areas.  A difference of  2-3 points is a clinically meaningful difference.  A difference of 2-3 points in the total score of the Quality of Life Index has been associated with significant improvement in overall quality of life, self-image, physical symptoms, and general health in studies assessing change in quality of life.  PHQ-9: Review Flowsheet       07/29/2022  Depression screen PHQ 2/9  Decreased Interest 0  Down, Depressed, Hopeless 0  PHQ - 2 Score 0  Altered sleeping 1  Tired, decreased energy 1  Change in appetite 1  Feeling bad or failure about yourself  0  Trouble concentrating 0  Moving slowly or fidgety/restless 0  Suicidal thoughts 0  PHQ-9 Score 3  Difficult doing work/chores Somewhat difficult   Interpretation of Total Score  Total Score Depression Severity:  1-4 = Minimal depression, 5-9 = Mild depression, 10-14 = Moderate depression, 15-19 = Moderately severe depression, 20-27 = Severe depression    Psychosocial Evaluation and Intervention:  Psychosocial Evaluation - 07/28/22 0943       Psychosocial Evaluation & Interventions   Interventions Encouraged to exercise with the program and follow exercise prescription;Relaxation education;Stress management education    Comments He can look to his mother, sister and brother in law. His sone lives in [place] and is a phone call away.Rich cant sleep that well at this time due his procedure.    Expected Outcomes Short: Start [facility] to help with mood. Long: Maintain a healthy mental state    Continue Psychosocial Services  Follow up required by staff  Psychosocial Re-Evaluation:   Psychosocial Discharge (Final Psychosocial Re-Evaluation):   Vocational Rehabilitation: Provide vocational rehab assistance to qualifying candidates.   Vocational Rehab Evaluation & Intervention:   Education: Education Goals: Education classes will be provided on a variety of topics geared toward better understanding of heart health and risk factor modification. Participant will state understanding/return demonstration of topics presented as noted by education test scores.  Learning Barriers/Preferences:  Learning Barriers/Preferences - 07/28/22 0941       Learning Barriers/Preferences   Learning Barriers None    Learning Preferences None             General Cardiac Education Topics:  AED/CPR: - Group verbal and written instruction with the use of models to demonstrate the basic use of the AED with the basic ABC's of resuscitation.   Anatomy and Cardiac Procedures: - Group verbal and visual presentation and models provide information about basic cardiac anatomy and function. Reviews the testing methods done to diagnose heart disease and the outcomes of the test results. Describes the treatment choices: Medical Management, Angioplasty, or Coronary Bypass Surgery for treating various heart conditions including Myocardial  Infarction, Angina, Valve Disease, and Cardiac Arrhythmias.  Written material given at graduation.   Medication Safety: - Group verbal and visual instruction to review commonly prescribed medications for heart and lung disease. Reviews the medication, class of the drug, and side effects. Includes the steps to properly store meds and maintain the prescription regimen.  Written material given at graduation.   Intimacy: - Group verbal instruction through game format to discuss how heart and lung disease can affect sexual intimacy. Written material given at graduation.. Flowsheet Row Cardiac Rehab from 08/13/2022 in [facility] Cardiac and Pulmonary Rehab  Date 08/06/22  Educator [facility]  Instruction Review Code 1- Verbalizes Understanding       Know Your Numbers and Heart Failure: - Group verbal and visual instruction to discuss disease risk factors for cardiac and pulmonary disease and treatment options.  Reviews associated critical values for Overweight/Obesity, Hypertension, Cholesterol, and Diabetes.  Discusses basics of heart failure: signs/symptoms and treatments.  Introduces Heart Failure Zone chart for action plan for heart failure.  Written material given at graduation.   Infection Prevention: - Provides verbal and written material to individual with discussion of infection control including proper hand washing and proper equipment cleaning during exercise session. Flowsheet Row Cardiac Rehab from 08/13/2022 in [facility] Cardiac and Pulmonary Rehab  Date 07/28/22  Educator [facility]  Instruction Review Code 1- Verbalizes Understanding       Falls Prevention: - Provides verbal and written material to individual with discussion of falls prevention and safety. Flowsheet Row Cardiac Rehab from 08/13/2022 in [facility] Cardiac and Pulmonary Rehab  Date 07/28/22  Educator [facility]  Instruction Review Code 1- Verbalizes Understanding       Other: -Provides group and verbal instruction on various topics (see  comments)   Knowledge Questionnaire Score:  Knowledge Questionnaire Score - 07/29/22 1256       Knowledge Questionnaire Score   Pre Score 25/26             Core Components/Risk Factors/Patient Goals at Admission:  Personal Goals and Risk Factors at Admission - 07/29/22 1257       Core Components/Risk Factors/Patient Goals on Admission    Weight Management Yes;Weight Loss    Intervention Weight Management: Develop a combined nutrition and exercise program designed to reach desired caloric intake, while maintaining appropriate intake of nutrient and fiber, sodium and fats, and  appropriate energy expenditure required for the weight goal.;Weight Management: Provide education and appropriate resources to help participant work on and attain dietary goals.;Weight Management/Obesity: Establish reasonable short term and long term weight goals.;Obesity: Provide education and appropriate resources to help participant work on and attain dietary goals.    Admit Weight 247 lb (112 kg)    Goal Weight: Short Term 240 lb (108.9 kg)    Goal Weight: Long Term 220 lb (99.8 kg)    Expected Outcomes Short Term: Continue to assess and modify interventions until short term weight is achieved;Long Term: Adherence to nutrition and physical activity/exercise program aimed toward attainment of established weight goal;Weight Loss: Understanding of general recommendations for a balanced deficit meal plan, which promotes 1-2 lb weight loss per week and includes a negative energy balance of [phone] kcal/d;Understanding recommendations for meals to include 15-35% energy as protein, 25-35% energy from fat, 35-60% energy from carbohydrates, less than '200mg'$  of dietary cholesterol, 20-35 gm of total fiber daily;Understanding of distribution of calorie intake throughout the day with the consumption of 4-5 meals/snacks    Heart Failure Yes    Intervention Provide a combined exercise and nutrition program that is supplemented  with education, support and counseling about heart failure. Directed toward relieving symptoms such as shortness of breath, decreased exercise tolerance, and extremity edema.    Expected Outcomes Improve functional capacity of life;Short term: Attendance in program 2-3 days a week with increased exercise capacity. Reported lower sodium intake. Reported increased fruit and vegetable intake. Reports medication compliance.;Short term: Daily weights obtained and reported for increase. Utilizing diuretic protocols set by physician.;Long term: Adoption of self-care skills and reduction of barriers for early signs and symptoms recognition and intervention leading to self-care maintenance.    Hypertension Yes    Intervention Provide education on lifestyle modifcations including regular physical activity/exercise, weight management, moderate sodium restriction and increased consumption of fresh fruit, vegetables, and low fat dairy, alcohol moderation, and smoking cessation.;Monitor prescription use compliance.    Expected Outcomes Short Term: Continued assessment and intervention until BP is < 140/25m HG in hypertensive participants. < 130/815mHG in hypertensive participants with diabetes, heart failure or chronic kidney disease.;Long Term: Maintenance of blood pressure at goal levels.    Lipids Yes    Intervention Provide education and support for participant on nutrition & aerobic/resistive exercise along with prescribed medications to achieve LDL '70mg'$ , HDL >'40mg'$ .    Expected Outcomes Short Term: Participant states understanding of desired cholesterol values and is compliant with medications prescribed. Participant is following exercise prescription and nutrition guidelines.;Long Term: Cholesterol controlled with medications as prescribed, with individualized exercise RX and with personalized nutrition plan. Value goals: LDL < '70mg'$ , HDL > 40 mg.             Education:Diabetes - Individual verbal and  written instruction to review signs/symptoms of diabetes, desired ranges of glucose level fasting, after meals and with exercise. Acknowledge that pre and post exercise glucose checks will be done for 3 sessions at entry of program.   Core Components/Risk Factors/Patient Goals Review:    Core Components/Risk Factors/Patient Goals at Discharge (Final Review):    ITP Comments:  ITP Comments     Row Name 07/28/22 0940 07/29/22 1253 07/30/22 0726 08/13/22 0940     ITP Comments Virtual Visit completed. Patient informed on EP and RD appointment and 6 Minute walk test. Patient also informed of patient health questionnaires on My Chart. Patient Verbalizes understanding. [facility]  Completed 6MWT and gym orientation. Initial ITP created and sent for review to Dr. Emily Filbert, Medical Director. First full day of exercise!  Patient was oriented to gym and equipment including functions, settings, policies, and procedures.  Patient's individual exercise prescription and treatment plan were reviewed.  All starting workloads were established based on the results of the 6 minute walk test done at initial orientation visit.  The plan for exercise progression was also introduced and progression will be customized based on patient's performance and goals. 30 Day review completed. Medical Director ITP review done, changes made as directed, and signed approval by Medical Director.   new to program             Comments:  currently out for family medical concern  has no transportation

## 2022-08-15 ENCOUNTER — Encounter: Payer: Commercial Managed Care - PPO | Admitting: *Deleted

## 2022-08-15 DIAGNOSIS — Z951 Presence of aortocoronary bypass graft: Secondary | ICD-10-CM | POA: Diagnosis not present

## 2022-08-15 NOTE — Progress Notes (Signed)
Daily Session Note  Patient Details  Name: Ronnie Mcintyre MRN: [id number] Date of Birth: [birth year] Referring Provider:   Flowsheet Row Cardiac Rehab from 07/29/2022 in [facility] Cardiac and Pulmonary Rehab  Referring Provider Emily Filbert MD       Encounter Date: 08/15/2022  Check In:  Session Check In - 08/15/22 0814       Check-In   Supervising physician immediately available to respond to emergencies See telemetry face sheet for immediately available ER MD    Location [facility]-Cardiac & Pulmonary Rehab    Staff Present Heath Lark, RN, BSN, CCRP;Jessica [place], MA, RCEP, CCRP, CCET;Joseph [place], [place]    Virtual Visit No    Medication changes reported     No    Fall or balance concerns reported    No    Warm-up and Cool-down Performed on first and last piece of equipment    Resistance Training Performed Yes    VAD Patient? No    PAD/SET Patient? No      Pain Assessment   Currently in Pain? No/denies                Social History   Tobacco Use  Smoking Status Never  Smokeless Tobacco Never    Goals Met:  Independence with exercise equipment Exercise tolerated well No report of concerns or symptoms today  Goals Unmet:  Not Applicable  Comments: Pt able to follow exercise prescription today without complaint.  Will continue to monitor for progression.    Dr. Emily Filbert is Medical Director for [place].  Dr. Ottie Glazier is Medical Director for [facility] Pulmonary Rehabilitation.

## 2022-08-18 ENCOUNTER — Encounter: Payer: Commercial Managed Care - PPO | Admitting: *Deleted

## 2022-08-20 ENCOUNTER — Encounter: Payer: Commercial Managed Care - PPO | Admitting: *Deleted

## 2022-08-20 DIAGNOSIS — Z951 Presence of aortocoronary bypass graft: Secondary | ICD-10-CM | POA: Diagnosis not present

## 2022-08-20 NOTE — Progress Notes (Signed)
Daily Session Note  Patient Details  Name: CARR SHARTZER MRN: [id number] Date of Birth: June 02, 1966 Referring Provider:   Flowsheet Row Cardiac Rehab from 07/29/2022 in [facility] Cardiac and Pulmonary Rehab  Referring Provider Emily Filbert MD       Encounter Date: 08/20/2022  Check In:  Session Check In - 08/20/22 0733       Check-In   Supervising physician immediately available to respond to emergencies See telemetry face sheet for immediately available ER MD    Location [facility]-Cardiac & Pulmonary Rehab    Staff Present Darlyne Russian, RN, ADN;Jessica Luan [place], MA, RCEP, CCRP, CCET;Noah Tickle, BS, Exercise Physiologist    Virtual Visit No    Medication changes reported     No    Fall or balance concerns reported    No    Warm-up and Cool-down Performed on first and last piece of equipment    Resistance Training Performed Yes    VAD Patient? No    PAD/SET Patient? No      Pain Assessment   Currently in Pain? No/denies                Social History   Tobacco Use  Smoking Status Never  Smokeless Tobacco Never    Goals Met:  Independence with exercise equipment Exercise tolerated well No report of concerns or symptoms today Strength training completed today  Goals Unmet:  Not Applicable  Comments: Pt able to follow exercise prescription today without complaint.  Will continue to monitor for progression.  Reviewed home exercise with pt today.  Pt plans to continue walking and using weights at home for exercise.  He also has plans to join [employer]. Reviewed THR, pulse, RPE, sign and symptoms, pulse oximetery and when to call 911 or MD.  Also discussed weather considerations and indoor options.  Pt voiced understanding.   Dr. Emily Filbert is Medical Director for [place].  Dr. Ottie Glazier is Medical Director for [facility] Pulmonary Rehabilitation.

## 2022-08-22 ENCOUNTER — Encounter: Payer: Commercial Managed Care - PPO | Attending: Internal Medicine | Admitting: *Deleted

## 2022-08-22 DIAGNOSIS — Z951 Presence of aortocoronary bypass graft: Secondary | ICD-10-CM

## 2022-08-22 NOTE — Progress Notes (Signed)
Daily Session Note  Patient Details  Name: Ronnie Mcintyre MRN: [id number] Date of Birth: 18-Sep-1965 Referring Provider:   Flowsheet Row Cardiac Rehab from 07/29/2022 in [facility] Cardiac and Pulmonary Rehab  Referring Provider Emily Filbert MD       Encounter Date: 08/22/2022  Check In:  Session Check In - 08/22/22 0805       Check-In   Supervising physician immediately available to respond to emergencies See telemetry face sheet for immediately available ER MD    Location [facility]-Cardiac & Pulmonary Rehab    Staff Present Heath Lark, RN, BSN, CCRP;Jessica [place], MA, RCEP, CCRP, CCET;Joseph [place], [place]    Virtual Visit No    Medication changes reported     No    Fall or balance concerns reported    No    Warm-up and Cool-down Performed on first and last piece of equipment    Resistance Training Performed Yes    VAD Patient? No    PAD/SET Patient? No      Pain Assessment   Currently in Pain? No/denies                Social History   Tobacco Use  Smoking Status Never  Smokeless Tobacco Never    Goals Met:  Independence with exercise equipment Exercise tolerated well No report of concerns or symptoms today  Goals Unmet:  Not Applicable  Comments: Pt able to follow exercise prescription today without complaint.  Will continue to monitor for progression.    Dr. Emily Filbert is Medical Director for [place].  Dr. Ottie Glazier is Medical Director for [facility] Pulmonary Rehabilitation.

## 2022-08-25 ENCOUNTER — Encounter: Payer: Commercial Managed Care - PPO | Admitting: *Deleted

## 2022-08-25 DIAGNOSIS — Z951 Presence of aortocoronary bypass graft: Secondary | ICD-10-CM

## 2022-08-25 NOTE — Progress Notes (Signed)
Daily Session Note  Patient Details  Name: Ronnie Mcintyre MRN: [id number] Date of Birth: Nov 22, 1965 Referring Provider:   Flowsheet Row Cardiac Rehab from 07/29/2022 in [facility] Cardiac and Pulmonary Rehab  Referring Provider Emily Filbert MD       Encounter Date: 08/25/2022  Check In:  Session Check In - 08/25/22 0732       Check-In   Supervising physician immediately available to respond to emergencies See telemetry face sheet for immediately available ER MD    Location [facility]-Cardiac & Pulmonary Rehab    Staff Present Darlyne Russian, RN, Doyce Para, BS, ACSM CEP, Exercise Physiologist;Joseph Tessie [place], [place]    Virtual Visit No    Medication changes reported     No    Fall or balance concerns reported    No    Warm-up and Cool-down Performed on first and last piece of equipment    Resistance Training Performed Yes    VAD Patient? No    PAD/SET Patient? No      Pain Assessment   Currently in Pain? No/denies                Social History   Tobacco Use  Smoking Status Never  Smokeless Tobacco Never    Goals Met:  Independence with exercise equipment Exercise tolerated well No report of concerns or symptoms today Strength training completed today  Goals Unmet:  Not Applicable  Comments: Pt able to follow exercise prescription today without complaint.  Will continue to monitor for progression.    Dr. Emily Filbert is Medical Director for [place].  Dr. Ottie Glazier is Medical Director for [facility] Pulmonary Rehabilitation.

## 2022-08-27 ENCOUNTER — Encounter: Payer: Commercial Managed Care - PPO | Admitting: *Deleted

## 2022-08-27 DIAGNOSIS — Z951 Presence of aortocoronary bypass graft: Secondary | ICD-10-CM

## 2022-08-27 NOTE — Progress Notes (Signed)
Completed initial nutrition consultation.   

## 2022-08-27 NOTE — Progress Notes (Signed)
Daily Session Note  Patient Details  Name: Ronnie Mcintyre MRN: [id number] Date of Birth: [birth year] Referring Provider:   Flowsheet Row Cardiac Rehab from 07/29/2022 in [facility] Cardiac and Pulmonary Rehab  Referring Provider Emily Filbert MD       Encounter Date: 08/27/2022  Check In:  Session Check In - 08/27/22 0726       Check-In   Supervising physician immediately available to respond to emergencies See telemetry face sheet for immediately available ER MD    Location [facility]-Cardiac & Pulmonary Rehab    Staff Present Darlyne Russian, RN, Lorin Mercy, MS, ACSM CEP, Exercise Physiologist;Noah Tickle, BS, Exercise Physiologist    Virtual Visit No    Medication changes reported     No    Fall or balance concerns reported    No    Warm-up and Cool-down Performed on first and last piece of equipment    Resistance Training Performed Yes    VAD Patient? No    PAD/SET Patient? No      Pain Assessment   Currently in Pain? No/denies                Social History   Tobacco Use  Smoking Status Never  Smokeless Tobacco Never    Goals Met:  Independence with exercise equipment Exercise tolerated well No report of concerns or symptoms today Strength training completed today  Goals Unmet:  Not Applicable  Comments: Pt able to follow exercise prescription today without complaint.  Will continue to monitor for progression.    Dr. Emily Filbert is Medical Director for [place].  Dr. Ottie Glazier is Medical Director for [facility] Pulmonary Rehabilitation.

## 2022-09-03 ENCOUNTER — Encounter: Payer: Commercial Managed Care - PPO | Admitting: *Deleted

## 2022-09-03 VITALS — Ht 72.0 in | Wt 246.9 lb

## 2022-09-03 DIAGNOSIS — Z951 Presence of aortocoronary bypass graft: Secondary | ICD-10-CM | POA: Diagnosis not present

## 2022-09-03 NOTE — Progress Notes (Signed)
Discharge Summary: Ronnie Mcintyre Sep 07, 1965   Duston graduated today from  rehab with 18 sessions completed.  Details of the patient's exercise prescription and what he  needs to do in order to continue the prescription and progress were discussed with patient.  Patient was given a copy of prescription and goals.  Patient verbalized understanding. [name]  plans to continue to exercise by walking on his own and going to gym.  [place] Name 07/29/22 1300 09/03/22 0737       6 Minute Walk   Phase Initial Discharge    Distance 1530 feet 1765 feet    Distance % Change -- 15.36 %    Distance Feet Change -- 235 ft    Walk Time 6 minutes 6 minutes    # of Rest Breaks 0 0    MPH 2.9 3.34    METS 3.91 4.55    RPE 13 12    Perceived Dyspnea  1 --    VO2 Peak 13.69 17.65    Symptoms No No    Resting HR 86 bpm 77 bpm    Resting BP 122/74 108/66    Resting Oxygen Saturation  98 % --    Exercise Oxygen Saturation  during 6 min walk 100 % --    Max Ex. HR 109 bpm 125 bpm    Max Ex. BP 128/78 136/74    2 Minute Post BP 114/70 --

## 2022-09-03 NOTE — Patient Instructions (Signed)
Discharge Patient Instructions  Patient Details  Name: Ronnie Mcintyre MRN: [id number] Date of Birth: 06-29-1966 Referring Provider:  Rusty Aus, MD   Number of Visits: 18  Reason for Discharge:  Patient reached a stable level of exercise. Patient independent in their exercise. Patient has met program and personal goals.  Smoking History:  Social History   Tobacco Use  Smoking Status Never  Smokeless Tobacco Never    Diagnosis:  S/P CABG x 3  Initial Exercise Prescription:  Initial Exercise Prescription - 07/29/22 1300       Date of Initial Exercise RX and Referring Provider   Date 07/29/22    Referring Provider Ronnie Filbert MD      Oxygen   Maintain Oxygen Saturation 88% or higher      Treadmill   MPH 2.5    Grade 1    Minutes 15    METs 3.26      REL-XR   Level 1    Speed 60    Minutes 15    METs 3.91      T5 Nustep   Level 2    SPM 80    Minutes 15    METs 3.91      Prescription Details   Frequency (times per week) 3    Duration Progress to 30 minutes of continuous aerobic without signs/symptoms of physical distress      Intensity   THRR 40-80% of Max Heartrate 118-150    Ratings of Perceived Exertion 11-15    Perceived Dyspnea 0-4      Progression   Progression Continue to progress workloads to maintain intensity without signs/symptoms of physical distress.      Resistance Training   Training Prescription Yes    Weight 3 lb    Reps 10-15             Discharge Exercise Prescription (Final Exercise Prescription Changes):  Exercise Prescription Changes - 08/20/22 0700       Home Exercise Plan   Plans to continue exercise at Home (comment)   walking, weights, [employer]   Frequency Add 2 additional days to program exercise sessions.    Initial Home Exercises Provided 08/20/22             Functional Capacity:  6 Minute Walk     Row Name 07/29/22 1300 09/03/22 0737       6 Minute Walk   Phase Initial Discharge     Distance 1530 feet 1765 feet    Distance % Change -- 15.36 %    Distance Feet Change -- 235 ft    Walk Time 6 minutes 6 minutes    # of Rest Breaks 0 0    MPH 2.9 3.34    METS 3.91 4.55    RPE 13 12    Perceived Dyspnea  1 --    VO2 Peak 13.69 17.65    Symptoms No No    Resting HR 86 bpm 77 bpm    Resting BP 122/74 108/66    Resting Oxygen Saturation  98 % --    Exercise Oxygen Saturation  during 6 min walk 100 % --    Max Ex. HR 109 bpm 125 bpm    Max Ex. BP 128/78 136/74    2 Minute Post BP 114/70 --            Nutrition & Weight - Outcomes:  Pre Biometrics - 07/29/22 1302  Pre Biometrics   Height 6' (1.829 m)    Weight 247 lb (112 kg)    Waist Circumference 43.5 inches    Hip Circumference 42.5 inches    Waist to Hip Ratio 1.02 %    BMI (Calculated) 33.49    Single Leg Stand 30 seconds   L            Post Biometrics - 09/03/22 0738        Post  Biometrics   Height 6' (1.829 m)    Weight 246 lb 14.4 oz (112 kg)    Waist Circumference 43 inches    Hip Circumference 42 inches    Waist to Hip Ratio 1.02 %    BMI (Calculated) 33.48    Single Leg Stand 30 seconds             Nutrition:  Nutrition Therapy & Goals - 08/27/22 0901       Nutrition Therapy   Diet Heart healthy, low Na    Drug/Food Interactions Statins/Certain Fruits    Protein (specify units) 90g    Fiber 35 grams    Whole Grain Foods 3 servings    Saturated Fats 20 max. grams    Fruits and Vegetables 8 servings/day    Sodium 1.5 grams      Personal Nutrition Goals   Nutrition Goal ST: Include 5 servings of fruits/vegetables per day. Include fiber, protein, and fat with snacks - examples: fruit with nuts, protein bar. Include wider variety of vegetables. LT: continue with heart healthy changes made post graduation    Comments 57 y.o. M admitted to cardiac rehab s/p CABG x3 also presenting with CAD, Ischemic cardiomyopathy, HLD, and HTN. PMHx includes chemo induced  cardiomyopathy, s/p chemo and radiation (2019), Hodgkin lymphoma, and large B-cell lymphoma, TIA. Relevant medications reviewed 08/27/22: furosemide, K+, rosuvastatin. Recent labs reviewed 08/27/22. Ronnie Mcintyre reports being motivated to continue heart healthy changes he made s/p CABG x3 including the elimination of fried foods, limiting eating out (now 1x/week), grilling/baking foods, limiting red meat (1x/week), cutting down portions for dinner while honoring hunger, eliminating sugar-sweetened beverages, swapping sweets for fruits and vegetables, and limiting sodium. Ronnie Mcintyre reports that he has lost some weight and would like to continue - discussed how to make sustainable changes and encouraged him that he has made many heart healthy changes already. Ronnie Mcintyre reports that he does not like many vegetables. He is excited to go back to work and feels working might make the changes he made easier to stick to becuase it will keep him busy. B: eggs or cereal (raisin bran or cheerios) L: he does not eat much for lunch - maybe a piece of fruit D: he has cut his portions (he feels uncomfortably full still). Chicken 5x/week, hamburger 1x/week, canned tuna - with brown rice and sometimes noodles and sometimes baked potatoes, with corn, green beans and peas (frozen). He does not use fat with cooking, but he uses [place] seasoning and pepper - he does not use salt. Discussed general heart healthy eating and discussed some changes; Ronnie Mcintyre continues to be motivated to [employer] with changes made.      Intervention Plan   Intervention Prescribe, educate and counsel regarding individualized specific dietary modifications aiming towards targeted core components such as weight, hypertension, lipid management, diabetes, heart failure and other comorbidities.    Expected Outcomes Short Term Goal: Understand basic principles of dietary content, such as calories, fat, sodium, cholesterol and nutrients.;Short Term Goal: A plan has been  developed  with personal nutrition goals set during dietitian appointment.;Long Term Goal: Adherence to prescribed nutrition plan.           Goals reviewed with patient; copy given to patient.

## 2022-09-03 NOTE — Progress Notes (Signed)
Daily Session Note  Patient Details  Name: Ronnie Mcintyre MRN: [id number] Date of Birth: 1965/11/06 Referring Provider:   Flowsheet Row Cardiac Rehab from 07/29/2022 in [facility] Cardiac [facility] Pul[facility]  Referring Provider Emily Filbert MD       Encounter Date: 09/03/2022  Check In:  Session Check In - 09/03/22 0723       Check-In   Supervising physician immediately available to respond to emergencies See telemetry face sheet for immediately available ER MD    Location [facility]-Cardiac & Pulmonary Rehab    Staff Present Darlyne Russian, RN, ADN;Jessica Luan Pulling, MA, RCEP, CCRP, CCET;Joseph [place], RCP,RRT,BSRT;Noah Goodland, [place], Exercise Physiologist    Virtual Visit No    Medication changes reported     No    Fall or balance concerns reported    No    Warm-up and Cool-down Performed on first and last piece of equipment    Resistance Training Performed Yes    VAD Patient? No    PAD/SET Patient? No      Pain Assessment   Currently in Pain? No/denies                Social History   Tobacco Use  Smoking Status Never  Smokeless Tobacco Never    Goals Met:  Independence with exercise equipment Exercise tolerated well No report of concerns or symptoms today Strength training completed today  Goals Unmet:  Not Applicable  Comments:  Ronnie Mcintyre graduated today from  rehab with 18 sessions completed.  Details of the patient's exercise prescription and what He needs to do in order to continue the prescription and progress were discussed with patient.  Patient was given a copy of prescription and goals.  Patient verbalized understanding.  Ronnie Mcintyre plans to continue to exercise by walking, weights, and [employer].   Juana Diaz Name 07/29/22 1300 09/03/22 0737       6 Minute Walk   Phase Initial Discharge    Distance 1530 feet 1765 feet    Distance % Change -- 15.36 %    Distance Feet Change -- 235 ft    Walk Time 6 minutes 6 minutes    # of Rest Breaks 0 0     MPH 2.9 3.34    METS 3.91 4.55    RPE 13 12    Perceived Dyspnea  1 --    VO2 Peak 13.69 17.65    Symptoms No No    Resting HR 86 bpm 77 bpm    Resting BP 122/74 108/66    Resting Oxygen Saturation  98 % --    Exercise Oxygen Saturation  during 6 min walk 100 % --    Max Ex. HR 109 bpm 125 bpm    Max Ex. BP 128/78 136/74    2 Minute Post BP 114/70 --              Dr. Emily Filbert is Medical Director for [place].  Dr. Ottie Glazier is Medical Director for [facility] Pulmonary Rehabilitation.

## 2022-09-05 NOTE — Progress Notes (Signed)
Cardiac Individual Treatment Plan  Patient Details  Name: Ronnie Mcintyre MRN: [id number] Date of Birth: 1966-03-16 Referring Provider:   Flowsheet Row Cardiac Rehab from 07/29/2022 in [facility] Cardiac and Pulmonary Rehab  Referring Provider Emily Filbert MD       Initial Encounter Date:  Flowsheet Row Cardiac Rehab from 07/29/2022 in [facility] Cardiac and Pulmonary Rehab  Date 07/29/22       Visit Diagnosis: S/P CABG x 3  Patient's Home Medications on Admission:  Current Outpatient Medications:    acetaminophen (TYLENOL) 325 MG tablet, Take 2 tablets (650 mg total) by mouth every 6 (six) hours as needed for mild pain (or Fever >/= 101)., Disp: , Rfl:    aspirin EC 81 MG tablet, Take 1 tablet (81 mg total) by mouth daily. Swallow whole., Disp: , Rfl:    carvedilol (COREG) 12.5 MG tablet, TAKE 1 TABLET (12.5MG TOTAL) BY MOUTH TWICE A DAY WITH MEALS (Patient not taking: Reported on 07/28/2022), Disp: 180 tablet, Rfl: 2   clopidogrel (PLAVIX) 75 MG tablet, Take 1 tablet (75 mg total) by mouth daily. (Patient not taking: Reported on 04/24/2022), Disp: 30 tablet, Rfl: 2   ezetimibe (ZETIA) 10 MG tablet, Take 1 tablet (10 mg total) by mouth daily. (Patient not taking: Reported on 05/23/2022), Disp: 90 tablet, Rfl: 2   furosemide (LASIX) 40 MG tablet, Take by mouth., Disp: , Rfl:    isosorbide mononitrate (IMDUR) 30 MG 24 hr tablet, Take 1 tablet (30 mg total) by mouth daily. (Patient not taking: Reported on 07/28/2022), Disp: 30 tablet, Rfl: 6   metoprolol tartrate (LOPRESSOR) 50 MG tablet, Take 1 tablet by mouth 2 (two) times daily., Disp: , Rfl:    potassium chloride SA (KLOR-CON M) 20 MEQ tablet, Take by mouth., Disp: , Rfl:    rosuvastatin (CRESTOR) 40 MG tablet, Take 1 tablet (40 mg total) by mouth daily., Disp: 90 tablet, Rfl: 2   sacubitril-valsartan (ENTRESTO) 49-51 MG, Take 1 tablet by mouth 2 (two) times daily. (Patient not taking: Reported on 07/28/2022), Disp: 180 tablet, Rfl: 0   spironolactone  (ALDACTONE) 25 MG tablet, TAKE 1/2 TABLET BY MOUTH DAILY, Disp: 45 tablet, Rfl: 0   spironolactone (ALDACTONE) 25 MG tablet, Take 1 tablet by mouth daily. 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   Hypertension    Non Hodgkin's lymphoma ([place])    TIA (transient ischemic attack)     Tobacco Use: Social History   Tobacco Use  Smoking Status Never  Smokeless Tobacco Never    Labs: Review Flowsheet       Latest Ref Rng & Units 10/02/2021  Labs for ITP Cardiac and Pulmonary Rehab  Cholestrol 0 - 200 mg/dL 154   LDL (calc) 0 - 99 mg/dL 89   HDL-C >40 mg/dL 35   Trlycerides <150 mg/dL 152   Hemoglobin A1c 4.8 - 5.6 % 5.4      Exercise Target Goals: Exercise Program Goal: Individual exercise prescription set using results from initial 6 min walk test and THRR while considering  patient's activity barriers and safety.   Exercise Prescription Goal: Initial exercise prescription builds to 30-45 minutes a day of aerobic activity, 2-3 days per week.  Home exercise guidelines will be given to patient during program as part of exercise prescription that the participant will acknowledge.   Education: Aerobic Exercise: - Group verbal and  visual presentation on the components of exercise prescription. Introduces F.I.T.T principle from ACSM for exercise prescriptions.  Reviews F.I.T.T. principles of aerobic exercise including progression. Written material given at graduation. Flowsheet Row Cardiac Rehab from 09/03/2022 in [facility] Cardiac and Pulmonary Rehab  Date 08/06/22  Educator [facility]  Instruction Review Code 1- Verbalizes Understanding       Education: Resistance Exercise: - Group verbal and visual presentation on the components of exercise prescription. Introduces F.I.T.T principle from ACSM for exercise prescriptions  Reviews F.I.T.T. principles of resistance  exercise including progression. Written material given at graduation. Flowsheet Row Cardiac Rehab from 09/03/2022 in [facility] Cardiac and Pulmonary Rehab  Date 08/13/22  Educator [facility]  Instruction Review Code 1- Verbalizes Understanding        Education: Exercise & Equipment Safety: - Individual verbal instruction and demonstration of equipment use and safety with use of the equipment. Flowsheet Row Cardiac Rehab from 09/03/2022 in [facility] Cardiac and Pulmonary Rehab  Date 07/28/22  Educator [facility]  Instruction Review Code 1- Verbalizes Understanding       Education: Exercise Physiology & General Exercise Guidelines: - Group verbal and written instruction with models to review the exercise physiology of the cardiovascular system and associated critical values. Provides general exercise guidelines with specific guidelines to those with heart or lung disease.  Flowsheet Row Cardiac Rehab from 09/03/2022 in [facility] Cardiac and Pulmonary Rehab  Date 07/30/22  Educator [facility]  Instruction Review Code 1- Verbalizes Understanding       Education: Flexibility, Balance, Mind/Body Relaxation: - Group verbal and visual presentation with interactive activity on the components of exercise prescription. Introduces F.I.T.T principle from ACSM for exercise prescriptions. Reviews F.I.T.T. principles of flexibility and balance exercise training including progression. Also discusses the mind body connection.  Reviews various relaxation techniques to help reduce and manage stress (i.e. Deep breathing, progressive muscle relaxation, and visualization). Balance handout provided to take home. Written material given at graduation. Flowsheet Row Cardiac Rehab from 09/03/2022 in [facility] Cardiac and Pulmonary Rehab  Date 08/20/22  Educator [facility]  Instruction Review Code 1- Verbalizes Understanding       Activity Barriers & Risk Stratification:  Activity Barriers & Cardiac Risk Stratification - 07/29/22 1302       Activity  Barriers & Cardiac Risk Stratification   Activity Barriers None    Cardiac Risk Stratification High             6 Minute Walk:  6 Minute Walk     Row Name 07/29/22 1300 09/03/22 0737       6 Minute Walk   Phase Initial Discharge    Distance 1530 feet 1765 feet    Distance % Change -- 15.36 %    Distance Feet Change -- 235 ft    Walk Time 6 minutes 6 minutes    # of Rest Breaks 0 0    MPH 2.9 3.34    METS 3.91 4.55    RPE 13 12    Perceived Dyspnea  1 --    VO2 Peak 13.69 17.65    Symptoms No No    Resting HR 86 bpm 77 bpm    Resting BP 122/74 108/66    Resting Oxygen Saturation  98 % --    Exercise Oxygen Saturation  during 6 min walk 100 % --    Max Ex. HR 109 bpm 125 bpm    Max Ex. BP 128/78 136/74    2 Minute Post BP 114/70 --  Oxygen Initial Assessment:   Oxygen Re-Evaluation:   Oxygen Discharge (Final Oxygen Re-Evaluation):   Initial Exercise Prescription:  Initial Exercise Prescription - 07/29/22 1300       Date of Initial Exercise RX and Referring Provider   Date 07/29/22    Referring Provider Emily Filbert MD      Oxygen   Maintain Oxygen Saturation 88% or higher      Treadmill   MPH 2.5    Grade 1    Minutes 15    METs 3.26      REL-XR   Level 1    Speed 60    Minutes 15    METs 3.91      T5 Nustep   Level 2    SPM 80    Minutes 15    METs 3.91      Prescription Details   Frequency (times per week) 3    Duration Progress to 30 minutes of continuous aerobic without signs/symptoms of physical distress      Intensity   THRR 40-80% of Max Heartrate 118-150    Ratings of Perceived Exertion 11-15    Perceived Dyspnea 0-4      Progression   Progression Continue to progress workloads to maintain intensity without signs/symptoms of physical distress.      Resistance Training   Training Prescription Yes    Weight 3 lb    Reps 10-15             Perform Capillary Blood Glucose checks as needed.  Exercise  Prescription Changes:   Exercise Prescription Changes     Row Name 07/29/22 1300 08/05/22 0800 08/18/22 1500 08/20/22 0700       Response to Exercise   Blood Pressure (Admit) 122/74 118/70 110/60 --    Blood Pressure (Exercise) 128/78 138/74 138/54 --    Blood Pressure (Exit) 114/70 110/60 98/54 --    Heart Rate (Admit) 86 bpm 98 bpm 100 bpm --    Heart Rate (Exercise) 109 bpm 122 bpm 134 bpm --    Heart Rate (Exit) 93 bpm -- 111 bpm --    Oxygen Saturation (Admit) 98 % -- -- --    Oxygen Saturation (Exercise) 100 % -- -- --    Rating of Perceived Exertion (Exercise) 13 14 13 $ --    Perceived Dyspnea (Exercise) 1 -- -- --    Symptoms None none none --    Comments 6MWT Results First two sessions of exercise -- --    Duration -- Continue with 30 min of aerobic exercise without signs/symptoms of physical distress. Continue with 30 min of aerobic exercise without signs/symptoms of physical distress. --    Intensity -- THRR unchanged THRR unchanged --      Progression   Progression -- Continue to progress workloads to maintain intensity without signs/symptoms of physical distress. Continue to progress workloads to maintain intensity without signs/symptoms of physical distress. --    Average METs -- 3.15 3.72 --      Resistance Training   Training Prescription -- Yes Yes --    Weight -- 3 lb 7 lb --    Reps -- 10-15 10-15 --      Interval Training   Interval Training -- No No --      Treadmill   MPH -- 3 3 --    Grade -- 1.5 2 --    Minutes -- 15 15 --    METs -- 3.92 4.12 --  REL-XR   Level -- 4 5 --    Minutes -- 15 15 --    METs -- 3.5 3.7 --      T5 Nustep   Level -- 2 2 --    Minutes -- 15 15 --    METs -- 1.9 3 --      Home Exercise Plan   Plans to continue exercise at -- -- -- Home (comment)  walking, weights, [employer]    Frequency -- -- -- Add 2 additional days to program exercise sessions.    Initial Home Exercises Provided -- -- -- 08/20/22       Oxygen   Maintain Oxygen Saturation -- 88% or higher 88% or higher --             Exercise Comments:   Exercise Comments     Row Name 07/30/22 0726           Exercise Comments First full day of exercise!  Patient was oriented to gym and equipment including functions, settings, policies, and procedures.  Patient's individual exercise prescription and treatment plan were reviewed.  All starting workloads were established based on the results of the 6 minute walk test done at initial orientation visit.  The plan for exercise progression was also introduced and progression will be customized based on patient's performance and goals.                Exercise Goals and Review:   Exercise Goals     Row Name 07/29/22 1302             Exercise Goals   Increase Physical Activity Yes       Intervention Provide advice, education, support and counseling about physical activity/exercise needs.;Develop an individualized exercise prescription for aerobic and resistive training based on initial evaluation findings, risk stratification, comorbidities and participant's personal goals.       Expected Outcomes Short Term: Attend rehab on a regular basis to increase amount of physical activity.;Long Term: Add in home exercise to make exercise part of routine and to increase amount of physical activity.;Long Term: Exercising regularly at least 3-5 days a week.       Increase Strength and Stamina Yes       Intervention Develop an individualized exercise prescription for aerobic and resistive training based on initial evaluation findings, risk stratification, comorbidities and participant's personal goals.;Provide advice, education, support and counseling about physical activity/exercise needs.       Expected Outcomes Long Term: Improve cardiorespiratory fitness, muscular endurance and strength as measured by increased METs and functional capacity (6MWT);Short Term: Perform resistance training  exercises routinely during rehab and add in resistance training at home;Short Term: Increase workloads from initial exercise prescription for resistance, speed, and METs.       Able to understand and use rate of perceived exertion (RPE) scale Yes       Intervention Provide education and explanation on how to use RPE scale       Expected Outcomes Long Term:  Able to use RPE to guide intensity level when exercising independently;Short Term: Able to use RPE daily in rehab to express subjective intensity level       Able to understand and use Dyspnea scale Yes       Intervention Provide education and explanation on how to use Dyspnea scale       Expected Outcomes Short Term: Able to use Dyspnea scale daily in rehab to express subjective sense of shortness of  breath during exertion;Long Term: Able to use Dyspnea scale to guide intensity level when exercising independently       Knowledge and understanding of Target Heart Rate Range (THRR) Yes       Intervention Provide education and explanation of THRR including how the numbers were predicted and where they are located for reference       Expected Outcomes Short Term: Able to state/look up THRR;Short Term: Able to use daily as guideline for intensity in rehab;Long Term: Able to use THRR to govern intensity when exercising independently       Able to check pulse independently Yes       Intervention Provide education and demonstration on how to check pulse in carotid and radial arteries.;Review the importance of being able to check your own pulse for safety during independent exercise       Expected Outcomes Short Term: Able to explain why pulse checking is important during independent exercise;Long Term: Able to check pulse independently and accurately       Understanding of Exercise Prescription Yes       Intervention Provide education, explanation, and written materials on patient's individual exercise prescription       Expected Outcomes Short Term: Able  to explain program exercise prescription;Long Term: Able to explain home exercise prescription to exercise independently                Exercise Goals Re-Evaluation :  Exercise Goals Re-Evaluation     Row Name 07/30/22 0726 08/05/22 0841 08/18/22 1540 08/20/22 0750       Exercise Goal Re-Evaluation   Exercise Goals Review Able to understand and use rate of perceived exertion (RPE) scale;Able to understand and use Dyspnea scale;Knowledge and understanding of Target Heart Rate Range (THRR);Understanding of Exercise Prescription Increase Physical Activity;Increase Strength and Stamina;Understanding of Exercise Prescription Increase Physical Activity;Increase Strength and Stamina;Understanding of Exercise Prescription --    Comments Reviewed RPE scale, THR and program prescription with pt today.  Pt voiced understanding and was given a copy of goals to take home. Ronnie Mcintyre is off to a good start in rehab. During his first two sessions he had an average MET level of 3.15 METs. He also was able to increase his workload on the treadmill from his initial prescription up to 3 mph with an incline of 1.5%. He was able to work at level 2 on the T5 and level 4 on the XR as well. We will continue to monitor his progress in the program. Ronnie Mcintyre continues to do well in rehab. He has increased his treadmill incline to 2% and has been working over 4 METS! He also increased his level on the XR to level 5 and increased his handweights to 7 lbs. He has been hitting his THR each session. He still would benefit from increasing his level on the T5 Nustep. Will continue to monitor. Ronnie Mcintyre is doing well in rehab.  He is already walking at home and plans to join [employer]. Reviewed home exercise with pt today.  Pt plans to continue walking and using weights at home for exercise.  He also has plans to join [employer]. Reviewed THR, pulse, RPE, sign and symptoms, pulse oximetery and when to call 911 or MD.  Also discussed weather  considerations and indoor options.  Pt voiced understanding.    Expected Outcomes Short: Use RPE daily to regulate intensity. Long: Follow program prescription in THR. Short: Increase workload on the T5. Long: Continue to improve strength and  stamina. Short: Increase level on T5 Nustep Long: Continue to increase overall MET level and stamina Short: Start to add in more exercise at home on off days Long: continue to exercise independently             Discharge Exercise Prescription (Final Exercise Prescription Changes):  Exercise Prescription Changes - 08/20/22 0700       Home Exercise Plan   Plans to continue exercise at Home (comment)   walking, weights, [employer]   Frequency Add 2 additional days to program exercise sessions.    Initial Home Exercises Provided 08/20/22             Nutrition:  Target Goals: Understanding of nutrition guidelines, daily intake of sodium <1543m, cholesterol <2049m calories 30% from fat and 7% or less from saturated fats, daily to have 5 or more servings of fruits and vegetables.  Education: All About Nutrition: -Group instruction provided by verbal, written material, interactive activities, discussions, models, and posters to present general guidelines for heart healthy nutrition including fat, fiber, MyPlate, the role of sodium in heart healthy nutrition, utilization of the nutrition label, and utilization of this knowledge for meal planning. Follow up email sent as well. Written material given at graduation. Flowsheet [facility]  Education need identified 07/29/22       Biometrics:  Pre Biometrics - 07/29/22 1302       Pre Biometrics   Height 6' (1.829 m)    Weight 247 lb (112 kg)    Waist Circumference 43.5 inches    Hip Circumference 42.5 inches    Waist to Hip Ratio 1.02 %    BMI (Calculated) 33.49    Single Leg Stand 30 seconds   L            Post Biometrics -  09/03/22 0738        Post  Biometrics   Height 6' (1.829 m)    Weight 246 lb 14.4 oz (112 kg)    Waist Circumference 43 inches    Hip Circumference 42 inches    Waist to Hip Ratio 1.02 %    BMI (Calculated) 33.48    Single Leg Stand 30 seconds             Nutrition Therapy Plan and Nutrition Goals:  Nutrition Therapy & Goals - 08/27/22 0901       Nutrition Therapy   Diet Heart healthy, low Na    Drug/Food Interactions Statins/Certain Fruits    Protein (specify units) 90g    Fiber 35 grams    Whole Grain Foods 3 servings    Saturated Fats 20 max. grams    Fruits and Vegetables 8 servings/day    Sodium 1.5 grams      Personal Nutrition Goals   Nutrition Goal ST: Include 5 servings of fruits/vegetables per day. Include fiber, protein, and fat with snacks - examples: fruit with nuts, protein bar. Include wider variety of vegetables. LT: continue with heart healthy changes made post graduation    Comments [age].o. M admitted to cardiac rehab s/p CABG x3 also presenting with CAD, Ischemic cardiomyopathy, HLD, and HTN. PMHx includes chemo induced cardiomyopathy, s/p chemo and radiation (2019), Hodgkin lymphoma, and large B-cell lymphoma, TIA. Relevant medications reviewed 08/27/22: furosemide, K+, rosuvastatin. Recent labs reviewed 08/27/22. Ronnie Mcintyre reports being motivated to continue heart healthy changes he made s/p CABG x3 including the elimination of fried foods, limiting eating out (now 1x/week),  grilling/baking foods, limiting red meat (1x/week), cutting down portions for dinner while honoring hunger, eliminating sugar-sweetened beverages, swapping sweets for fruits and vegetables, and limiting sodium. Ronnie Mcintyre reports that he has lost some weight and would like to continue - discussed how to make sustainable changes and encouraged him that he has made many heart healthy changes already. Ronnie Mcintyre reports that he does not like many vegetables. He is excited to go back to work and feels working might  make the changes he made easier to stick to becuase it will keep him busy. B: eggs or cereal (raisin bran or cheerios) L: he does not eat much for lunch - maybe a piece of fruit D: he has cut his portions (he feels uncomfortably full still). Chicken 5x/week, hamburger 1x/week, canned tuna - with brown rice and sometimes noodles and sometimes baked potatoes, with corn, green beans and peas (frozen). He does not use fat with cooking, but he uses [place] seasoning and pepper - he does not use salt. Discussed general heart healthy eating and discussed some changes; Ronnie Mcintyre continues to be motivated to [employer] with changes made.      Intervention Plan   Intervention Prescribe, educate and counsel regarding individualized specific dietary modifications aiming towards targeted core components such as weight, hypertension, lipid management, diabetes, heart failure and other comorbidities.    Expected Outcomes Short Term Goal: Understand basic principles of dietary content, such as calories, fat, sodium, cholesterol and nutrients.;Short Term Goal: A plan has been developed with personal nutrition goals set during dietitian appointment.;Long Term Goal: Adherence to prescribed nutrition plan.             Nutrition Assessments:  MEDIFICTS Score Key: ?70 Need to make dietary changes  40-70 Heart Healthy Diet ? 40 Therapeutic Level Cholesterol Diet  Flowsheet Row Cardiac Rehab from 09/03/2022 in [facility] Cardiac and Pulmonary Rehab  Picture Your Plate Total Score on Admission 67  Picture Your Plate Total Score on Discharge 73      Picture Your Plate Scores: [id number] Unhealthy dietary pattern with much room for improvement. 41-50 Dietary pattern unlikely to meet recommendations for good health and room for improvement. 51-60 More healthful dietary pattern, with some room for improvement.  >60 Healthy dietary pattern, although there may be some specific behaviors that could be improved.    Nutrition Goals  Re-Evaluation:  Nutrition Goals Re-Evaluation     [place] Name 08/20/22 0759             Goals   Nutrition Goal Meet with dietitian       Comment Ronnie Mcintyre would like to schedule an appointment with dietitian after she returns.  He is already working on getting in enough fruits and vegetables.  He has already started to work on portion control.  He is eating half the size he used to and stopped eating after 7pm except fruits.       Expected Outcome Short: Continue to work on portion control Long: Meet with dietitian                Nutrition Goals Discharge (Final Nutrition Goals Re-Evaluation):  Nutrition Goals Re-Evaluation - 08/20/22 0759       Goals   Nutrition Goal Meet with dietitian    Comment Ronnie Mcintyre would like to schedule an appointment with dietitian after she returns.  He is already working on getting in enough fruits and vegetables.  He has already started to work on portion control.  He is eating half the size he  used to and stopped eating after 7pm except fruits.    Expected Outcome Short: Continue to work on portion control Long: Meet with dietitian             Psychosocial: Target Goals: Acknowledge presence or absence of significant depression and/or stress, maximize coping skills, provide positive support system. Participant is able to verbalize types and ability to use techniques and skills needed for reducing stress and depression.   Education: Stress, Anxiety, and Depression - Group verbal and visual presentation to define topics covered.  Reviews how body is impacted by stress, anxiety, and depression.  Also discusses healthy ways to reduce stress and to treat/manage anxiety and depression.  Written material given at graduation.   Education: Sleep Hygiene -Provides group verbal and written instruction about how sleep can affect your health.  Define sleep hygiene, discuss sleep cycles and impact of sleep habits. Review good sleep hygiene tips.    Initial Review &  Psychosocial Screening:  Initial Psych Review & Screening - 07/28/22 0942       Initial Review   Current issues with Current Sleep Concerns    Comments Ronnie Mcintyre cant sleep that well at this time due his procedure.      Family Dynamics   Good Support System? Yes    Comments He can look to his mother, sister and brother in law. His sone lives in [place] and is a phone call away.      Barriers   Psychosocial barriers to participate in program The patient should benefit from training in stress management and relaxation.;There are no identifiable barriers or psychosocial needs.      Screening Interventions   Interventions Encouraged to exercise;To provide support and resources with identified psychosocial needs;Provide feedback about the scores to participant    Expected Outcomes Short Term goal: Utilizing psychosocial counselor, staff and physician to assist with identification of specific Stressors or current issues interfering with healing process. Setting desired goal for each stressor or current issue identified.;Long Term Goal: Stressors or current issues are controlled or eliminated.;Short Term goal: Identification and review with participant of any Quality of Life or Depression concerns found by scoring the questionnaire.;Long Term goal: The participant improves quality of Life and PHQ9 Scores as seen by post scores and/or verbalization of changes             Quality of Life Scores:   Quality of Life - 09/05/22 1110       Quality of Life   Select Quality of Life      Quality of Life Scores   Health/Function Pre 26.87 %    Health/Function Post 26.93 %    Health/Function % Change 0.22 %    Socioeconomic Pre 28.13 %    Socioeconomic Post 27 %    Socioeconomic % Change  -4.02 %    Psych/Spiritual Pre 29.14 %    Psych/Spiritual Post 24.64 %    Psych/Spiritual % Change -15.44 %    Family Pre 27 %    Family Post 27.17 %    Family % Change 0.63 %    GLOBAL Pre 27.63 %    GLOBAL  Post 26.45 %    GLOBAL % Change -4.27 %            Scores of 19 and below usually indicate a poorer quality of life in these areas.  A difference of  2-3 points is a clinically meaningful difference.  A difference of 2-3 points in the total score  of the Quality of Life Index has been associated with significant improvement in overall quality of life, self-image, physical symptoms, and general health in studies assessing change in quality of life.  PHQ-9: Review Flowsheet       09/05/2022 07/29/2022  Depression screen PHQ 2/9  Decreased Interest 0 0  Down, Depressed, Hopeless 0 0  PHQ - 2 Score 0 0  Altered sleeping 0 1  Tired, decreased energy 0 1  Change in appetite 0 1  Feeling bad or failure about yourself  0 0  Trouble concentrating 0 0  Moving slowly or fidgety/restless 0 0  Suicidal thoughts 0 0  PHQ-9 Score 0 3  Difficult doing work/chores Not difficult at all Somewhat difficult   Interpretation of Total Score  Total Score Depression Severity:  1-4 = Minimal depression, 5-9 = Mild depression, 10-14 = Moderate depression, 15-19 = Moderately severe depression, 20-27 = Severe depression   Psychosocial Evaluation and Intervention:  Psychosocial Evaluation - 07/28/22 0943       Psychosocial Evaluation & Interventions   Interventions Encouraged to exercise with the program and follow exercise prescription;Relaxation education;Stress management education    Comments He can look to his mother, sister and brother in law. His sone lives in [place] and is a phone call away.Ronnie Mcintyre cant sleep that well at this time due his procedure.    Expected Outcomes Short: Start [facility] to help with mood. Long: Maintain a healthy mental state    Continue Psychosocial Services  Follow up required by staff             Psychosocial Re-Evaluation:  Psychosocial Re-Evaluation     [place] Name 08/20/22 [phone]             Psychosocial Re-Evaluation   Current issues with Current Sleep  Concerns;Current Stress Concerns       Comments Ronnie Mcintyre is doing well in rehab.  He was struggling with sleep at beginning.  He is now sleeping 7-8 hrs a night and feeling better overall.  He is already exercinsing at home as well. He is enjoying not working and not having those work stressors.  He is supposed to go back to work on 2/26. Overall, doing well and staying positive.  He is about 7 weeks out now and looking forward to moving more again.  He is planning to attend relaxation class today and trying out yoga.       Expected Outcomes Short: Continue to add in more movement Long: Continue to stay positive       Interventions Stress management education;Encouraged to attend Cardiac Rehabilitation for the exercise;Relaxation education       Continue Psychosocial Services  Follow up required by staff                Psychosocial Discharge (Final Psychosocial Re-Evaluation):  Psychosocial Re-Evaluation - 08/20/22 0756       Psychosocial Re-Evaluation   Current issues with Current Sleep Concerns;Current Stress Concerns    Comments Ronnie Mcintyre is doing well in rehab.  He was struggling with sleep at beginning.  He is now sleeping 7-8 hrs a night and feeling better overall.  He is already exercinsing at home as well. He is enjoying not working and not having those work stressors.  He is supposed to go back to work on 2/26. Overall, doing well and staying positive.  He is about 7 weeks out now and looking forward to moving more again.  He is planning to attend relaxation class today  and trying out yoga.    Expected Outcomes Short: Continue to add in more movement Long: Continue to stay positive    Interventions Stress management education;Encouraged to attend Cardiac Rehabilitation for the exercise;Relaxation education    Continue Psychosocial Services  Follow up required by staff             Vocational Rehabilitation: Provide vocational rehab assistance to qualifying candidates.   Vocational  Rehab Evaluation & Intervention:   Education: Education Goals: Education classes will be provided on a variety of topics geared toward better understanding of heart health and risk factor modification. Participant will state understanding/return demonstration of topics presented as noted by education test scores.  Learning Barriers/Preferences:  Learning Barriers/Preferences - 07/28/22 0941       Learning Barriers/Preferences   Learning Barriers None    Learning Preferences None             General Cardiac Education Topics:  AED/CPR: - Group verbal and written instruction with the use of models to demonstrate the basic use of the AED with the basic ABC's of resuscitation.   Anatomy and Cardiac Procedures: - Group verbal and visual presentation and models provide information about basic cardiac anatomy and function. Reviews the testing methods done to diagnose heart disease and the outcomes of the test results. Describes the treatment choices: Medical Management, Angioplasty, or Coronary Bypass Surgery for treating various heart conditions including Myocardial Infarction, Angina, Valve Disease, and Cardiac Arrhythmias.  Written material given at graduation. Flowsheet Row Cardiac Rehab from 09/03/2022 in [facility] Cardiac and Pulmonary Rehab  Date 08/27/22  Educator SB  Instruction Review Code 1- Verbalizes Understanding       Medication Safety: - Group verbal and visual instruction to review commonly prescribed medications for heart and lung disease. Reviews the medication, class of the drug, and side effects. Includes the steps to properly store meds and maintain the prescription regimen.  Written material given at graduation.   Intimacy: - Group verbal instruction through game format to discuss how heart and lung disease can affect sexual intimacy. Written material given at graduation.. Flowsheet Row Cardiac Rehab from 09/03/2022 in [facility] Cardiac and Pulmonary Rehab  Date 08/06/22   Educator [facility]  Instruction Review Code 1- Verbalizes Understanding       Know Your Numbers and Heart Failure: - Group verbal and visual instruction to discuss disease risk factors for cardiac and pulmonary disease and treatment options.  Reviews associated critical values for Overweight/Obesity, Hypertension, Cholesterol, and Diabetes.  Discusses basics of heart failure: signs/symptoms and treatments.  Introduces Heart Failure Zone chart for action plan for heart failure.  Written material given at graduation.   Infection Prevention: - Provides verbal and written material to individual with discussion of infection control including proper hand washing and proper equipment cleaning during exercise session. Flowsheet Row Cardiac Rehab from 09/03/2022 in [facility] Cardiac and Pulmonary Rehab  Date 07/28/22  Educator [facility]  Instruction Review Code 1- Verbalizes Understanding       Falls Prevention: - Provides verbal and written material to individual with discussion of falls prevention and safety. Flowsheet Row Cardiac Rehab from 09/03/2022 in [facility] Cardiac and Pulmonary Rehab  Date 07/28/22  Educator [facility]  Instruction Review Code 1- Verbalizes Understanding       Other: -Provides group and verbal instruction on various topics (see comments) [place] from 09/03/2022 in [facility] Cardiac and Pulmonary Rehab  Date 09/03/22  Educator SB  Instruction Review Code 1- Verbalizes Understanding  [Jeopardy]  Knowledge Questionnaire Score:  Knowledge Questionnaire Score - 09/05/22 1113       Knowledge Questionnaire Score   Pre Score 25/26    Post Score 26/26             Core Components/Risk Factors/Patient Goals at Admission:  Personal Goals and Risk Factors at Admission - 07/29/22 1257       Core Components/Risk Factors/Patient Goals on Admission    Weight Management Yes;Weight Loss    Intervention Weight Management: Develop a combined nutrition and exercise program  designed to reach desired caloric intake, while maintaining appropriate intake of nutrient and fiber, sodium and fats, and appropriate energy expenditure required for the weight goal.;Weight Management: Provide education and appropriate resources to help participant work on and attain dietary goals.;Weight Management/Obesity: Establish reasonable short term and long term weight goals.;Obesity: Provide education and appropriate resources to help participant work on and attain dietary goals.    Admit Weight 247 lb (112 kg)    Goal Weight: Short Term 240 lb (108.9 kg)    Goal Weight: Long Term 220 lb (99.8 kg)    Expected Outcomes Short Term: Continue to assess and modify interventions until short term weight is achieved;Long Term: Adherence to nutrition and physical activity/exercise program aimed toward attainment of established weight goal;Weight Loss: Understanding of general recommendations for a balanced deficit meal plan, which promotes 1-2 lb weight loss per week and includes a negative energy balance of [phone] kcal/d;Understanding recommendations for meals to include 15-35% energy as protein, 25-35% energy from fat, 35-60% energy from carbohydrates, less than 255m of dietary cholesterol, 20-35 gm of total fiber daily;Understanding of distribution of calorie intake throughout the day with the consumption of 4-5 meals/snacks    Heart Failure Yes    Intervention Provide a combined exercise and nutrition program that is supplemented with education, support and counseling about heart failure. Directed toward relieving symptoms such as shortness of breath, decreased exercise tolerance, and extremity edema.    Expected Outcomes Improve functional capacity of life;Short term: Attendance in program 2-3 days a week with increased exercise capacity. Reported lower sodium intake. Reported increased fruit and vegetable intake. Reports medication compliance.;Short term: Daily weights obtained and reported for  increase. Utilizing diuretic protocols set by physician.;Long term: Adoption of self-care skills and reduction of barriers for early signs and symptoms recognition and intervention leading to self-care maintenance.    Hypertension Yes    Intervention Provide education on lifestyle modifcations including regular physical activity/exercise, weight management, moderate sodium restriction and increased consumption of fresh fruit, vegetables, and low fat dairy, alcohol moderation, and smoking cessation.;Monitor prescription use compliance.    Expected Outcomes Short Term: Continued assessment and intervention until BP is < 140/994mHG in hypertensive participants. < 130/8086mG in hypertensive participants with diabetes, heart failure or chronic kidney disease.;Long Term: Maintenance of blood pressure at goal levels.    Lipids Yes    Intervention Provide education and support for participant on nutrition & aerobic/resistive exercise along with prescribed medications to achieve LDL <72m37mDL >40mg80m Expected Outcomes Short Term: Participant states understanding of desired cholesterol values and is compliant with medications prescribed. Participant is following exercise prescription and nutrition guidelines.;Long Term: Cholesterol controlled with medications as prescribed, with individualized exercise RX and with personalized nutrition plan. Value goals: LDL < 72mg,58m > 40 mg.             Education:Diabetes - Individual verbal and written instruction to review signs/symptoms of diabetes, desired ranges  of glucose level fasting, after meals and with exercise. Acknowledge that pre and post exercise glucose checks will be done for 3 sessions at entry of program.   Core Components/Risk Factors/Patient Goals Review:   Goals and Risk Factor Review     Row Name 08/20/22 0801             Core Components/Risk Factors/Patient Goals Review   Personal Goals Review Weight Management/Obesity;Heart  Failure;Hypertension;Lipids       Review Ronnie Mcintyre is doing well in rehab.  His weight is staying steady between 4-5 lb.  He has not had any heart failure symptoms.  His pressures are doing well and he is checking them at home as well.  He has been doing well on his medicaitons and not had any side effects.       Expected Outcomes Short: Continue to work on weight loss Long: Continue to monitor risk factors.                Core Components/Risk Factors/Patient Goals at Discharge (Final Review):   Goals and Risk Factor Review - 08/20/22 0801       Core Components/Risk Factors/Patient Goals Review   Personal Goals Review Weight Management/Obesity;Heart Failure;Hypertension;Lipids    Review Ronnie Mcintyre is doing well in rehab.  His weight is staying steady between 4-5 lb.  He has not had any heart failure symptoms.  His pressures are doing well and he is checking them at home as well.  He has been doing well on his medicaitons and not had any side effects.    Expected Outcomes Short: Continue to work on weight loss Long: Continue to monitor risk factors.             ITP Comments:  ITP Comments     Row Name 07/28/22 0940 07/29/22 1253 07/30/22 0726 08/13/22 0940 08/27/22 1034   ITP Comments Virtual Visit completed. Patient informed on EP and RD appointment and 6 Minute walk test. Patient also informed of patient health questionnaires on My Chart. Patient Verbalizes understanding. Visit diagnosis can be found in [facility]/29/2022. Completed 6MWT and gym orientation. Initial ITP created and sent for review to Dr. Emily Filbert, Medical Director. First full day of exercise!  Patient was oriented to gym and equipment including functions, settings, policies, and procedures.  Patient's individual exercise prescription and treatment plan were reviewed.  All starting workloads were established based on the results of the 6 minute walk test done at initial orientation visit.  The plan for exercise progression was also  introduced and progression will be customized based on patient's performance and goals. 30 Day review completed. Medical Director ITP review done, changes made as directed, and signed approval by Medical Director.   new to program Completed initial nutrition consultation            Comments: Discharge ITP

## 2022-09-09 ENCOUNTER — Other Ambulatory Visit: Payer: Self-pay | Admitting: Cardiology

## 2022-11-03 ENCOUNTER — Other Ambulatory Visit: Payer: Self-pay | Admitting: Cardiovascular Disease

## 2022-11-03 NOTE — Telephone Encounter (Signed)
Needs appointment for refills. It looks like patient may have switched to [facility]? Thank you!

## 2022-11-25 ENCOUNTER — Observation Stay
Admission: EM | Admit: 2022-11-25 | Discharge: 2022-11-26 | Disposition: A | Discharge status: Home / Self Care | Payer: Commercial Managed Care - PPO | Attending: Internal Medicine | Admitting: Internal Medicine

## 2022-11-25 ENCOUNTER — Emergency Department: Payer: Commercial Managed Care - PPO

## 2022-11-25 ENCOUNTER — Other Ambulatory Visit: Payer: Self-pay

## 2022-11-25 ENCOUNTER — Encounter: Payer: Self-pay | Admitting: *Deleted

## 2022-11-25 DIAGNOSIS — I2510 Atherosclerotic heart disease of native coronary artery without angina pectoris: Secondary | ICD-10-CM | POA: Insufficient documentation

## 2022-11-25 DIAGNOSIS — I10 Essential (primary) hypertension: Secondary | ICD-10-CM | POA: Insufficient documentation

## 2022-11-25 DIAGNOSIS — G459 Transient cerebral ischemic attack, unspecified: Principal | ICD-10-CM | POA: Insufficient documentation

## 2022-11-25 DIAGNOSIS — Z8572 Personal history of non-Hodgkin lymphomas: Secondary | ICD-10-CM | POA: Diagnosis not present

## 2022-11-25 DIAGNOSIS — Z7902 Long term (current) use of antithrombotics/antiplatelets: Secondary | ICD-10-CM | POA: Diagnosis not present

## 2022-11-25 DIAGNOSIS — Z951 Presence of aortocoronary bypass graft: Secondary | ICD-10-CM | POA: Insufficient documentation

## 2022-11-25 DIAGNOSIS — E669 Obesity, unspecified: Secondary | ICD-10-CM | POA: Insufficient documentation

## 2022-11-25 DIAGNOSIS — R200 Anesthesia of skin: Secondary | ICD-10-CM | POA: Diagnosis present

## 2022-11-25 DIAGNOSIS — Z79899 Other long term (current) drug therapy: Secondary | ICD-10-CM | POA: Insufficient documentation

## 2022-11-25 DIAGNOSIS — Z6833 Body mass index (BMI) 33.0-33.9, adult: Secondary | ICD-10-CM | POA: Insufficient documentation

## 2022-11-25 DIAGNOSIS — Z7982 Long term (current) use of aspirin: Secondary | ICD-10-CM | POA: Insufficient documentation

## 2022-11-25 DIAGNOSIS — I25810 Atherosclerosis of coronary artery bypass graft(s) without angina pectoris: Secondary | ICD-10-CM | POA: Diagnosis present

## 2022-11-25 LAB — CBC
HCT: 51.8 % (ref 39.0–52.0)
Hemoglobin: 17.1 g/dL — ABNORMAL HIGH (ref 13.0–17.0)
MCH: 27.9 pg (ref 26.0–34.0)
MCHC: 33 g/dL (ref 30.0–36.0)
MCV: 84.6 fL (ref 80.0–100.0)
Platelets: 248 10*3/uL (ref 150–400)
RBC: 6.12 MIL/uL — ABNORMAL HIGH (ref 4.22–5.81)
RDW: 15.3 % (ref 11.5–15.5)
WBC: 12 10*3/uL — ABNORMAL HIGH (ref 4.0–10.5)
nRBC: 0 % (ref 0.0–0.2)

## 2022-11-25 LAB — DIFFERENTIAL
Abs Immature Granulocytes: 0.06 10*3/uL (ref 0.00–0.07)
Basophils Absolute: 0.1 10*3/uL (ref 0.0–0.1)
Basophils Relative: 1 %
Eosinophils Absolute: 0.2 10*3/uL (ref 0.0–0.5)
Eosinophils Relative: 2 %
Immature Granulocytes: 1 %
Lymphocytes Relative: 14 %
Lymphs Abs: 1.6 10*3/uL (ref 0.7–4.0)
Monocytes Absolute: 0.9 10*3/uL (ref 0.1–1.0)
Monocytes Relative: 8 %
Neutro Abs: 9.1 10*3/uL — ABNORMAL HIGH (ref 1.7–7.7)
Neutrophils Relative %: 74 %

## 2022-11-25 LAB — COMPREHENSIVE METABOLIC PANEL
ALT: 26 U/L (ref 0–44)
AST: 25 U/L (ref 15–41)
Albumin: 4 g/dL (ref 3.5–5.0)
Alkaline Phosphatase: 68 U/L (ref 38–126)
Anion gap: 9 (ref 5–15)
BUN: 20 mg/dL (ref 6–20)
CO2: 28 mmol/L (ref 22–32)
Calcium: 9.2 mg/dL (ref 8.9–10.3)
Chloride: 101 mmol/L (ref 98–111)
Creatinine, Ser: 1.18 mg/dL (ref 0.61–1.24)
GFR, Estimated: >60 mL/min (ref >60)
Glucose, Bld: 106 mg/dL — ABNORMAL HIGH (ref 70–99)
Potassium: 3.7 mmol/L (ref 3.5–5.1)
Sodium: 138 mmol/L (ref 135–145)
Total Bilirubin: 1.4 mg/dL — ABNORMAL HIGH (ref 0.3–1.2)
Total Protein: 7.2 g/dL (ref 6.5–8.1)

## 2022-11-25 LAB — CBG MONITORING, ED
Glucose-Capillary: 106 mg/dL — ABNORMAL HIGH (ref 70–99)
Glucose-Capillary: 97 mg/dL (ref 70–99)

## 2022-11-25 LAB — PROTIME-INR
INR: 1.1 (ref 0.8–1.2)
Prothrombin Time: 14 seconds (ref 11.4–15.2)

## 2022-11-25 LAB — ETHANOL: Alcohol, Ethyl (B): <10 mg/dL (ref <10)

## 2022-11-25 LAB — APTT: aPTT: 27 seconds (ref 24–36)

## 2022-11-25 MED ORDER — SODIUM CHLORIDE 0.9% FLUSH: 3.0000 mL | Freq: Once | INTRAVENOUS | Status: DC

## 2022-11-25 MED ORDER — CLOPIDOGREL BISULFATE 75 MG PO TABS
300.0000 mg | ORAL_TABLET | Freq: Once | ORAL | Status: AC
  Administered 2022-11-25: 300 mg via ORAL
  Filled 2022-11-25: qty 4

## 2022-11-25 NOTE — ED Provider Notes (Signed)
[facility] Provider Note    Event Date/Time   First MD Initiated Contact with Patient 11/25/22 1956     (approximate)   History   Numbness   HPI  Ronnie Mcintyre is a 57 y.o. male  here with numbness. Pt reports that earlier today just prior to arrival he developed acute onset of left arm numbness and some weakness. Felt like it was initially going to sleep then lost all sensation. Had difficulty holding things with it. Sx improving but persistent. No other deficits. He presents as code stroke. No other complaints. 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       Physical Exam   Triage Vital Signs: ED Triage Vitals  Enc Vitals Group     BP 11/25/22 1948 (!) 128/99     Pulse Rate 11/25/22 1948 94     Resp 11/25/22 1948 19     Temp 11/25/22 1948 97.8 F (36.6 C)     Temp Source 11/25/22 1948 Oral     SpO2 11/25/22 1948 97 %     Weight 11/25/22 1940 248 lb (112.5 kg)     Height 11/25/22 1940 6' (1.829 m)     Head Circumference --      Peak Flow --      Pain Score 11/25/22 1940 6     Pain Loc --      Pain Edu? --      Excl. in GC? --     Most recent vital signs: Vitals:   11/25/22 2200 11/25/22 2352  BP: (!) 140/89   Pulse: 92   Resp: 15   Temp:  98.2 F (36.8 C)  SpO2: 94%      General: Awake, no distress.  CV:  Good peripheral perfusion. RRR. Resp:  Normal work of breathing. Lungs clear to auscultation. Abd:  No distention. No tenderness. Other:  On my assessment, face symmetric. Speech normal. Strength 5/5 bl UE and LE. No drift. Normal sensation to light touch bl UE and LE. Gait deferred.   ED Results / Procedures / Treatments   Labs (all labs ordered are listed, but only abnormal results are displayed) Labs Reviewed  CBC - Abnormal; Notable for the following components:      Result Value   WBC 12.0 (*)    RBC 6.12 (*)    Hemoglobin 17.1 (*)    All other components within normal limits  DIFFERENTIAL - Abnormal;  Notable for the following components:   Neutro Abs 9.1 (*)    All other components within normal limits  COMPREHENSIVE METABOLIC PANEL - Abnormal; Notable for the following components:   Glucose, Bld 106 (*)    Total Bilirubin 1.4 (*)    All other components within normal limits  CBG MONITORING, ED - Abnormal; Notable for the following components:   Glucose-Capillary 106 (*)    All other components within normal limits  PROTIME-INR  APTT  ETHANOL  CBG MONITORING, ED  I-STAT CREATININE, ED     EKG Normal sinus rhythm, VR 93. PR 181, QRS 98, QTc 452. No acute ST elevations or depressions. No ischemia ro infarct.   RADIOLOGY MR Brain: Pending MR Spine Cervical: Pending  I also independently reviewed and agree with radiologist interpretations.   PROCEDURES:  Critical Care performed: No  .1-3 Lead EKG Interpretation  Performed by: Shaune Pollack, MD Authorized by: Shaune Pollack, MD     Interpretation: normal     ECG rate:  80-90   ECG rate assessment: normal     Rhythm: sinus rhythm     Ectopy: none     Conduction: normal   Comments:     Indication: TIA     MEDICATIONS ORDERED IN ED: Medications  sodium chloride flush (NS) 0.9 % injection 3 mL (has no administration in time range)  clopidogrel (PLAVIX) tablet 300 mg (300 mg Oral Given 11/25/22 2125)     IMPRESSION / MDM / ASSESSMENT AND PLAN / ED COURSE  I reviewed the triage vital signs and the nursing notes.                              Differential diagnosis includes, but is not limited to, TIA, CVA, cervical radiculopathy, MS, atypical angina/ACS  Patient's presentation is most consistent with acute presentation with potential threat to life or bodily function.  The patient is on the cardiac monitor to evaluate for evidence of arrhythmia and/or significant heart rate changes  57 yo M here with transient arm numbness/weakness, now mostly resolved. Clinically, suspect TIA. Less likely small CVA or  cervical radiculopathy, which is much less likely given resolution of sx in ED here and minimal pain. CT head shows NAICA. Activated as code stroke on arrival, case discussed with Radiology and Neurology. Neuro recommends plavix load and admission. MR pending. Labs o/w reassuring. Sx resolved in ED.    FINAL CLINICAL IMPRESSION(S) / ED DIAGNOSES   Final diagnoses:  TIA (transient ischemic attack)     Rx / DC Orders   ED Discharge Orders     None        Note:  This document was prepared using Dragon voice recognition software and may include unintentional dictation errors.   Shaune Pollack, MD 11/26/22 [id number]

## 2022-11-25 NOTE — Consult Note (Signed)
[facility] Tele[facility]urology Consult Services   Patient Name:   Ronnie Mcintyre, Ronnie Mcintyre. Date of Birth:   June 26, 1966 Identification Number:   MRN - [id number] Date of Service:   11/25/2022 19:59:42  Diagnosis:       R20.2 - Paresthesia of skin  Impression:      57 y/o M, history of HTN, CAD s/p CABG, presenting with acute onset RUE paresthesias and weakness, onset of symptoms around 6:30 PM, with some spontaneous improvement, at the time of my evaluation NIHSS 2 for very mild drift of RUE and reduced sensation only over right hand. NCHCT did not show acute abnormalities. Given objectively mild symptoms, and patient agreeing that his symptoms at this time are not disabling, recommended against thrombolytic, and low clinical suspicion for LVO given NIHSS < 6.    Differential diagnosis includes mild ischemic stroke, although cervical radiculopathy is also a consideration. Recommend further work-up to evaluate for possible stroke, particularly given his risk factors.  Our recommendations are outlined below.  Recommendations:        Stroke/Telemetry Floor       Neuro Checks       Bedside Swallow Eval       DVT Prophylaxis       IV Fluids, Normal Saline       Head of Bed 30 Degrees       Euglycemia and Avoid Hyperthermia (PRN Acetaminophen)       Bolus with Clopidogrel 300 mg bolus x1 and initiate dual antiplatelet therapy with Aspirin 81 mg daily and Clopidogrel 75 mg daily       Antihypertensives PRN if Blood pressure is greater than 220/120 or there is a concern for End organ damage/contraindications for permissive HTN. If blood pressure is greater than 220/120 give labetalol PO or IV or Vasotec IV with a goal of 15% reduction in BP during the first 24 hours.       -recommend MRI brain and C spine w/o contrast, along with MRA head/neck       -if MRI does not show stroke, no need to continue Plavix       -telemetry, TTE if MRI stroke positive       -lipid profile, A1C  Sign  Out:       Discussed with Emergency Department Provider    ------------------------------------------------------------------------------  Advanced Imaging: Advanced Imaging Deferred because:  Non-disabling symptoms as verified by the patient; no cortical signs so not consistent with LVO   Metrics: Last Known Well: 11/25/2022 18:30:00 [facility] Notification Time: 11/25/2022 19:59:42 Arrival Time: 11/25/2022 19:35:00 Stamp Time: 11/25/2022 19:59:42 Initial Response Time: 11/25/2022 20:03:06 Symptoms: RUE numbness, weakness. Initial patient interaction: 11/25/2022 20:05:14 NIHSS Assessment Completed: 11/25/2022 20:26:14 Patient is not a candidate for Thrombolytic. Thrombolytic Medical Decision: 11/25/2022 20:26:16 Patient was not deemed candidate for Thrombolytic because of following reasons: No disabling symptoms.  CT head showed no acute hemorrhage or acute core infarct. I personally Reviewed the CT Head and it Showed no acute intracranial abnormalities  Primary Provider Notified of Diagnostic Impression and Management Plan on: 11/25/2022 20:35:07    ------------------------------------------------------------------------------  History of Present Illness: Patient is a 57 year old Male.  Patient was brought by private transportation with symptoms of RUE numbness, weakness. Patient reports that starting around 6:30 PM, he began to notice numbness of his right arm particularly from elbow down to hand, possibly involving the thumb in particular. At one point, he did feel abnormal sensation above the elbow into the shoulder region, but this has  improved. Earlier, he was having a lot of difficulty opening and closing his hand, but this has also improved, although he still feels that the sensation is off and that the hand is a little bit weaker than normal. He came to ER for further evaluation. He also mentions having neck pain the past few weeks, particularly over the right  side of neck, but this did not worsen today.    Past Medical History:      Hypertension      Coronary Artery Disease      There is no history of Diabetes Mellitus      There is no history of Stroke  Medications:  No Anticoagulant use  Antiplatelet use: Yes aspirin 81 mg daily Reviewed EMR for current medications  Allergies:  Reviewed  Social History: Smoking: No Alcohol Use: Yes  Family History:  There is no family history of premature cerebrovascular disease pertinent to this consultation  ROS : 14 Points Review of Systems was performed and was negative except mentioned in HPI.  Past Surgical History: There Is No Surgical History Contributory To Today's Visit     Examination: BP(131/84), Pulse(93), Blood Glucose(97) 1A: Level of Consciousness - Alert; keenly responsive + 0 1B: Ask Month and Age - Both Questions Right + 0 1C: Blink Eyes & Squeeze Hands - Performs Both Tasks + 0 2: Test Horizontal Extraocular Movements - Normal + 0 3: Test Visual Fields - No Visual Loss + 0 4: Test Facial Palsy (Use Grimace if Obtunded) - Normal symmetry + 0 5A: Test Left Arm Motor Drift - No Drift for 10 Seconds + 0 5B: Test Right Arm Motor Drift - Drift, but doesn't hit bed + 1 6A: Test Left Leg Motor Drift - No Drift for 5 Seconds + 0 6B: Test Right Leg Motor Drift - No Drift for 5 Seconds + 0 7: Test Limb Ataxia (FNF/Heel-Shin) - No Ataxia + 0 8: Test Sensation - Mild-Moderate Loss: Less Sharp/More Dull + 1 9: Test Language/Aphasia - Normal; No aphasia + 0 10: Test Dysarthria - Normal + 0 11: Test Extinction/Inattention - No abnormality + 0  NIHSS Score: 2  NIHSS Free Text : reduced sensation over right hand    Pre-Morbid Modified Rankin Scale: 0 Points = No symptoms at all  Spoke with : Dr. Erma Heritage  Patient/Family was informed the Neurology Consult would occur via TeleHealth consult by way of interactive audio and video telecommunications and consented to receiving  care in this manner.   Patient is being evaluated for possible acute neurologic impairment and high probability of imminent or life-threatening deterioration. I spent total of 37 minutes providing care to this patient, including time for face to face visit via telemedicine, review of medical records, imaging studies and discussion of findings with providers, the patient and/or family.   Dr Peri Jefferson   [facility] For Inpatient follow-up with [facility] physician please call [facility] [phone]. This is not an outpatient service. Post hospital discharge, please contact hospital directly.  Please do not communicate with [facility] physicians via secure chat. If you have any questions, Please contact [facility]. Please call or reconsult our service if there are any clinical or diagnostic changes.

## 2022-11-25 NOTE — ED Triage Notes (Addendum)
Pt reports numbness in right arm since 1830 tonight. No headache.  No n/v/  no slurred speech.   Pt ambulating without diff.  Nonsmoker.   No chest pain or sob. pt alert.    Pt to ct scan via wheelchair.

## 2022-11-25 NOTE — Assessment & Plan Note (Addendum)
Will order TIA focused plan for patient including swallow screening, aspirin Plavix and statin.  MRI brain and neck are actually not actionable as quoted above.  I think we can continue with patient's blood pressure and heart medications given his recent CABG in 2023.  Patient will need physical therapy and OT evaluation.  Will also get an echo. If workup is negative, anticipate Discharge in AM. Patient was loaded with plavix per neurology. Will follow up with them if they would like 3 weeks of plavix.

## 2022-11-25 NOTE — ED Notes (Signed)
Pt in MRI.

## 2022-11-25 NOTE — H&P (Signed)
History and Physical    Patient: Ronnie Mcintyre:[id number] DOB: [birth year] DOA: 11/25/2022 DOS: the patient was seen and examined on 11/25/2022 PCP: Danella Penton, MD  Patient coming from: Home  Chief Complaint:  Chief Complaint  Patient presents with   Numbness   HPI: Ronnie Mcintyre is a 57 y.o. male with medical history significant of non-Hodgkin's lymphoma as well as prior CABG in 2023.  Patient was in his usual state of health till approximately 6 PM when he had just done dishes and was working on his phone while sitting when he reports new sensation of numbness and loss of sensation of his right hand and forearm.  It is difficult for patient to report if he also had loss of sensation in the right arm.  The sensation lasted about 30 minutes and was progressive, insidious onset with no aggravating relieving factor.  There is no particular history to suggest that patient had loss of strength in the right upper extremity.  There was no vision changes no trauma no other focal weakness.  No prior similar episodes.  After about 30 minutes of persistent progressive loss of sensation, patient presented to [facility] ER.  Were workup fluids was initiated for TIA/stroke.  ER course is notable some right arm drift. But thereafter resolution of patient's rapid upper extremity weakness/numbness.  Patient has remained vitally stable, medical evaluation is sought.  Patient at this time reports minimal soreness of the right elbow area as well as right shoulder area.  Function is intact Review of Systems: As mentioned in the history of present illness. All other systems reviewed and are negative. Past Medical History:  Diagnosis Date   Cancer (HCC) 12/2017   Non-Hogkins lumphoma   Hypertension    Non Hodgkin's lymphoma (HCC)    TIA (transient ischemic attack)    Past Surgical History:  Procedure Laterality Date   ANKLE SURGERY Right    LEFT HEART CATH AND CORONARY ANGIOGRAPHY N/A 05/05/2022    Procedure: LEFT HEART CATH AND CORONARY ANGIOGRAPHY;  Surgeon: Iran Ouch, MD;  Location: [facility] INVASIVE CV LAB;  Service: Cardiovascular;  Laterality: N/A;   PORTACATH PLACEMENT Right 02/11/2018   Procedure: INSERTION PORT-A-CATH;  Surgeon: Leafy Ro, MD;  Location: [facility] ORS;  Service: General;  Laterality: Right;   Social History:  reports that he has never smoked. He has never used smokeless tobacco. He reports current alcohol use. He reports that he does not use drugs.  No Known Allergies  Family History  Problem Relation Age of Onset   Hypertension Mother    Hypertension Father    Multiple myeloma Father    Transient ischemic attack Father     Prior to Admission medications   Medication Sig Start Date End Date Taking? Authorizing Provider  acetaminophen (TYLENOL) 325 MG tablet Take 2 tablets (650 mg total) by mouth every 6 (six) hours as needed for mild pain (or Fever >/= 101). 01/16/18  Yes Auburn Bilberry, MD  aspirin EC 81 MG tablet Take 1 tablet (81 mg total) by mouth daily. Swallow whole. 04/15/22  Yes Iran Ouch, MD  metoprolol succinate (TOPROL-XL) 50 MG 24 hr tablet Take 50 mg by mouth 2 (two) times daily.   Yes [provider]  rosuvastatin (CRESTOR) 40 MG tablet Take 1 tablet (40 mg total) by mouth daily. 04/21/22  Yes Iran Ouch, MD  sacubitril-valsartan (ENTRESTO) 24-26 MG Take 1 tablet by mouth 2 (two) times daily.   Yes [provider]  carvedilol (COREG) 12.5 MG tablet TAKE 1 TABLET (12.5MG  TOTAL) BY MOUTH TWICE A DAY WITH MEALS Patient not taking: Reported on 07/28/2022 04/15/22   Iran Ouch, MD  clopidogrel (PLAVIX) 75 MG tablet Take 1 tablet (75 mg total) by mouth daily. Patient not taking: Reported on 04/24/2022 04/15/22   Iran Ouch, MD  ezetimibe (ZETIA) 10 MG tablet Take 1 tablet (10 mg total) by mouth daily. Patient not taking: Reported on 05/23/2022 04/15/22   Iran Ouch, MD  furosemide (LASIX) 40 MG tablet  Take 40 mg by mouth daily as needed. Patient not taking: Reported on 11/25/2022 07/16/22   [provider]  isosorbide mononitrate (IMDUR) 30 MG 24 hr tablet Take 1 tablet (30 mg total) by mouth daily. Patient not taking: Reported on 07/28/2022 05/05/22 05/05/23  Iran Ouch, MD  potassium chloride SA (KLOR-CON M) 20 MEQ tablet Take 20 mEq by mouth daily as needed. Patient not taking: Reported on 11/25/2022 07/16/22   [provider]  sacubitril-valsartan (ENTRESTO) 49-51 MG Take 1 tablet by mouth 2 (two) times daily. Patient not taking: Reported on 07/28/2022 03/27/22   Iran Ouch, MD  spironolactone (ALDACTONE) 25 MG tablet TAKE 1/2 TABLET BY MOUTH DAILY Patient taking differently: Take 25 mg by mouth daily. 09/09/22   Charlsie Quest, NP    Physical Exam: Vitals:   11/25/22 2100 11/25/22 2130 11/25/22 2200 11/25/22 2352  BP: (!) 131/99 (!) 131/90 (!) 140/89   Pulse: 93 100 92   Resp: 19 (!) 24 15   Temp:    98.2 F (36.8 C)  TempSrc:    Oral  SpO2: 100% 95% 94%   Weight:      Height:       General: Patient in no distress speech sounds normal Respiratory; bilateral intravesicular Abdomen soft nontender Cardiovascular exam S1-S2 normal Neurologic exam: Alert awake oriented x 3 no focal motor deficit. Soft touch sensation is preserved over bilateral upper extremities and is comparable. Data Reviewed:  Labs on Admission:  Results for orders placed or performed during the hospital encounter of 11/25/22 (from the past 24 hour(s))  CBG monitoring, ED     Status: Abnormal   Collection Time: 11/25/22  7:43 PM  Result Value Ref Range   Glucose-Capillary 106 (H) 70 - 99 mg/dL  CBG monitoring, ED     Status: None   Collection Time: 11/25/22  8:00 PM  Result Value Ref Range   Glucose-Capillary 97 70 - 99 mg/dL  Protime-INR     Status: None   Collection Time: 11/25/22  8:02 PM  Result Value Ref Range   Prothrombin Time 14.0 11.4 - 15.2 seconds   INR 1.1 0.8 -  1.2  APTT     Status: None   Collection Time: 11/25/22  8:02 PM  Result Value Ref Range   aPTT 27 24 - 36 seconds  CBC     Status: Abnormal   Collection Time: 11/25/22  8:02 PM  Result Value Ref Range   WBC 12.0 (H) 4.0 - 10.5 K/uL   RBC 6.12 (H) 4.22 - 5.81 MIL/uL   Hemoglobin 17.1 (H) 13.0 - 17.0 g/dL   HCT 16.1 09.6 - 04.5 %   MCV 84.6 80.0 - 100.0 fL   MCH 27.9 26.0 - 34.0 pg   MCHC 33.0 30.0 - 36.0 g/dL   RDW 40.9 81.1 - 91.4 %   Platelets 248 150 - 400 K/uL   nRBC 0.0 0.0 - 0.2 %  Differential     Status: Abnormal   Collection Time: 11/25/22  8:02 PM  Result Value Ref Range   Neutrophils Relative % 74 %   Neutro Abs 9.1 (H) 1.7 - 7.7 K/uL   Lymphocytes Relative 14 %   Lymphs Abs 1.6 0.7 - 4.0 K/uL   Monocytes Relative 8 %   Monocytes Absolute 0.9 0.1 - 1.0 K/uL   Eosinophils Relative 2 %   Eosinophils Absolute 0.2 0.0 - 0.5 K/uL   Basophils Relative 1 %   Basophils Absolute 0.1 0.0 - 0.1 K/uL   Immature Granulocytes 1 %   Abs Immature Granulocytes 0.06 0.00 - 0.07 K/uL  Comprehensive metabolic panel     Status: Abnormal   Collection Time: 11/25/22  8:02 PM  Result Value Ref Range   Sodium 138 135 - 145 mmol/L   Potassium 3.7 3.5 - 5.1 mmol/L   Chloride 101 98 - 111 mmol/L   CO2 28 22 - 32 mmol/L   Glucose, Bld 106 (H) 70 - 99 mg/dL   BUN 20 6 - 20 mg/dL   Creatinine, Ser 4.09 0.61 - 1.24 mg/dL   Calcium 9.2 8.9 - 81.1 mg/dL   Total Protein 7.2 6.5 - 8.1 g/dL   Albumin 4.0 3.5 - 5.0 g/dL   AST 25 15 - 41 U/L   ALT 26 0 - 44 U/L   Alkaline Phosphatase 68 38 - 126 U/L   Total Bilirubin 1.4 (H) 0.3 - 1.2 mg/dL   GFR, Estimated >91 >47 mL/min   Anion gap 9 5 - 15  Ethanol     Status: None   Collection Time: 11/25/22  8:02 PM  Result Value Ref Range   Alcohol, Ethyl (B) <10 <10 mg/dL   Basic Metabolic Panel: Recent Labs  Lab 11/25/22 2002  NA 138  K 3.7  CL 101  CO2 28  GLUCOSE 106*  BUN 20  CREATININE 1.18  CALCIUM 9.2   Liver Function  Tests: Recent Labs  Lab 11/25/22 2002  AST 25  ALT 26  ALKPHOS 68  BILITOT 1.4*  PROT 7.2  ALBUMIN 4.0   No results for input(s): "LIPASE", "AMYLASE" in the last 168 hours. No results for input(s): "AMMONIA" in the last 168 hours. CBC: Recent Labs  Lab 11/25/22 2002  WBC 12.0*  NEUTROABS 9.1*  HGB 17.1*  HCT 51.8  MCV 84.6  PLT 248   Cardiac Enzymes: No results for input(s): "CKTOTAL", "CKMB", "CKMBINDEX", "TROPONINIHS" in the last 168 hours.  BNP (last 3 results) No results for input(s): "PROBNP" in the last 8760 hours. CBG: Recent Labs  Lab 11/25/22 1943 11/25/22 2000  GLUCAP 106* 97    Radiological Exams on Admission:  MR Cervical Spine Wo Contrast  Result Date: 11/25/2022 CLINICAL DATA:  Acute cervical myelopathy EXAM: MRI CERVICAL SPINE WITHOUT CONTRAST TECHNIQUE: Multiplanar, multisequence MR imaging of the cervical spine was performed. No intravenous contrast was administered. COMPARISON:  None Available. FINDINGS: Alignment: Reversal of normal cervical lordosis. No static subluxation. Vertebrae: No fracture, evidence of discitis, or bone lesion. Cord: Normal signal and morphology. Posterior Fossa, vertebral arteries, paraspinal tissues: Negative. Disc levels: C1-2: Unremarkable. C2-3: Right facet hypertrophy. There is no spinal canal stenosis. No neural foraminal stenosis. C3-4: Mild right facet hypertrophy. There is no spinal canal stenosis. No neural foraminal stenosis. C4-5: Normal disc space and facet joints. There is no spinal canal stenosis. No neural foraminal stenosis. C5-6: Small disc bulge. Mild spinal canal stenosis. No neural foraminal stenosis. C6-7:  Normal disc space and facet joints. There is no spinal canal stenosis. No neural foraminal stenosis. C7-T1: Small left subarticular disc protrusion. There is no spinal canal stenosis. No neural foraminal stenosis. IMPRESSION: 1. No acute abnormality of the cervical spine. 2. Mild spinal canal stenosis at C5-6.  3. Small left subarticular disc protrusion at C7-T1 without associated stenosis. Electronically Signed   By: Deatra Robinson M.D.   On: 11/25/2022 23:33   MR BRAIN WO CONTRAST  Result Date: 11/25/2022 CLINICAL DATA:  Acute neurologic deficit EXAM: MRI HEAD WITHOUT CONTRAST TECHNIQUE: Multiplanar, multiecho pulse sequences of the brain and surrounding structures were obtained without intravenous contrast. COMPARISON:  10/02/2021 FINDINGS: Brain: No acute infarct, mass effect or extra-axial collection. No acute or chronic hemorrhage. Minimal multifocal hyperintense T2-weight signal within the white matter. The midline structures are normal. Vascular: Major flow voids are preserved. Skull and upper cervical spine: Normal calvarium and skull base. Visualized upper cervical spine and soft tissues are normal. Sinuses/Orbits:Left maxillary sinus retention cyst.  Normal orbits. IMPRESSION: No acute intracranial abnormality. Electronically Signed   By: Deatra Robinson M.D.   On: 11/25/2022 23:27   CT HEAD CODE STROKE WO CONTRAST  Result Date: 11/25/2022 CLINICAL DATA:  Code stroke.  Right-sided numbness EXAM: CT HEAD WITHOUT CONTRAST TECHNIQUE: Contiguous axial images were obtained from the base of the skull through the vertex without intravenous contrast. RADIATION DOSE REDUCTION: This exam was performed according to the departmental dose-optimization program which includes automated exposure control, adjustment of the mA and/or kV according to patient size and/or use of iterative reconstruction technique. COMPARISON:  None Available. FINDINGS: Brain: There is no mass, hemorrhage or extra-axial collection. The size and configuration of the ventricles and extra-axial CSF spaces are normal. The brain parenchyma is normal, without evidence of acute or chronic infarction. Vascular: No abnormal hyperdensity of the major intracranial arteries or dural venous sinuses. No intracranial atherosclerosis. Skull: The visualized skull  base, calvarium and extracranial soft tissues are normal. Sinuses/Orbits: No fluid levels or advanced mucosal thickening of the visualized paranasal sinuses. No mastoid or middle ear effusion. The orbits are normal. ASPECTS [facility] Stroke Program Early CT Score) - Ganglionic level infarction (caudate, lentiform nuclei, internal capsule, insula, M1-M3 cortex): 7 - Supraganglionic infarction (M4-M6 cortex): 3 Total score (0-10 with 10 being normal): 10 IMPRESSION: 1. No acute intracranial abnormality. 2. ASPECTS is 10. These results were called by telephone at the time of interpretation on 11/25/2022 at 8:02 pm to provider Shaune Pollack , who verbally acknowledged these results. Electronically Signed   By: Deatra Robinson M.D.   On: 11/25/2022 20:03    EKG: Independently reviewed. NSR   Assessment and Plan: * TIA (transient ischemic attack) Will order TIA focused plan for patient including swallow screening, aspirin Plavix and statin.  MRI brain and neck are actually not actionable as quoted above.  I think we can continue with patient's blood pressure and heart medications given his recent CABG in 2023.  Patient will need physical therapy and OT evaluation.  Will also get an echo. If workup is negative, anticipate Discharge in AM. Patient was loaded with plavix per neurology. Will follow up with them if they would like 3 weeks of plavix.      Advance Care Planning:   Code Status: Full Code   Consults: neurology input is appareciated.  Family Communication: per patient.  Severity of Illness: The appropriate patient status for this patient is OBSERVATION. Observation status is judged to be reasonable and necessary  in order to provide the required intensity of service to ensure the patient's safety. The patient's presenting symptoms, physical exam findings, and initial radiographic and laboratory data in the context of their medical condition is felt to place them at decreased risk for further clinical  deterioration. Furthermore, it is anticipated that the patient will be medically stable for discharge from the hospital within 2 midnights of admission.   Author: Nolberto Hanlon, MD 11/25/2022 11:53 PM  For on call review [email or web address].

## 2022-11-25 NOTE — ED Notes (Signed)
Pt's grips now equal and sensation improving in R forearm and hand

## 2022-11-25 NOTE — ED Notes (Signed)
Fsbs 106 

## 2022-11-25 NOTE — ED Notes (Signed)
Patient arrived to CT 

## 2022-11-25 NOTE — ED Notes (Signed)
Pt returned from MRI. EDP at bedside

## 2022-11-25 NOTE — ED Notes (Signed)
called to activate code stroke per MD [name]

## 2022-11-26 ENCOUNTER — Observation Stay (HOSPITAL_BASED_OUTPATIENT_CLINIC_OR_DEPARTMENT_OTHER)
Admit: 2022-11-26 | Discharge: 2022-11-26 | Disposition: A | Discharge status: Home / Self Care | Payer: Commercial Managed Care - PPO | Attending: Internal Medicine | Admitting: Internal Medicine

## 2022-11-26 ENCOUNTER — Observation Stay: Payer: Commercial Managed Care - PPO

## 2022-11-26 DIAGNOSIS — G459 Transient cerebral ischemic attack, unspecified: Secondary | ICD-10-CM

## 2022-11-26 DIAGNOSIS — I25810 Atherosclerosis of coronary artery bypass graft(s) without angina pectoris: Secondary | ICD-10-CM | POA: Diagnosis present

## 2022-11-26 LAB — CREATININE, SERUM
Creatinine, Ser: 1.08 mg/dL (ref 0.61–1.24)
GFR, Estimated: >60 mL/min (ref >60)

## 2022-11-26 LAB — CBC
HCT: 48.4 % (ref 39.0–52.0)
Hemoglobin: 15.8 g/dL (ref 13.0–17.0)
MCH: 28.1 pg (ref 26.0–34.0)
MCHC: 32.6 g/dL (ref 30.0–36.0)
MCV: 86 fL (ref 80.0–100.0)
Platelets: 201 10*3/uL (ref 150–400)
RBC: 5.63 MIL/uL (ref 4.22–5.81)
RDW: 15.4 % (ref 11.5–15.5)
WBC: 11.2 10*3/uL — ABNORMAL HIGH (ref 4.0–10.5)
nRBC: 0 % (ref 0.0–0.2)

## 2022-11-26 LAB — ECHOCARDIOGRAM COMPLETE
AR max vel: 2.95 cm2
AV Area VTI: 2.79 cm2
AV Area mean vel: 2.83 cm2
AV Mean grad: 2 mmHg
AV Peak grad: 3.7 mmHg
Ao pk vel: 0.97 m/s
Area-P 1/2: 4.44 cm2
Height: 72 in
MV VTI: 3.83 cm2
S' Lateral: 3.3 cm
Weight: 3968 oz

## 2022-11-26 LAB — HEMOGLOBIN A1C
Hgb A1c MFr Bld: 5.3 % (ref 4.8–5.6)
Mean Plasma Glucose: 105.41 mg/dL

## 2022-11-26 LAB — LIPID PANEL
Cholesterol: 101 mg/dL (ref 0–200)
HDL: 31 mg/dL — ABNORMAL LOW (ref >40)
LDL Cholesterol: 49 mg/dL (ref 0–99)
Total CHOL/HDL Ratio: 3.3 RATIO
Triglycerides: 106 mg/dL (ref <150)
VLDL: 21 mg/dL (ref 0–40)

## 2022-11-26 LAB — PROTIME-INR
INR: 1.1 (ref 0.8–1.2)
Prothrombin Time: 14.3 seconds (ref 11.4–15.2)

## 2022-11-26 LAB — APTT: aPTT: 28 seconds (ref 24–36)

## 2022-11-26 LAB — HIV ANTIBODY (ROUTINE TESTING W REFLEX): HIV Screen 4th Generation wRfx: NONREACTIVE

## 2022-11-26 MED ORDER — SENNOSIDES-DOCUSATE SODIUM 8.6-50 MG PO TABS: 1.0000 | ORAL_TABLET | Freq: Every evening | ORAL | Status: DC | PRN

## 2022-11-26 MED ORDER — DEXTROSE IN LACTATED RINGERS 5 % IV SOLN: INTRAVENOUS | Status: DC

## 2022-11-26 MED ORDER — PERFLUTREN LIPID MICROSPHERE
1.0000 mL | INTRAVENOUS | Status: AC | PRN
  Administered 2022-11-26: 5 mL via INTRAVENOUS

## 2022-11-26 MED ORDER — ENOXAPARIN SODIUM 60 MG/0.6ML IJ SOSY: 0.5000 mg/kg | PREFILLED_SYRINGE | INTRAMUSCULAR | Status: DC

## 2022-11-26 MED ORDER — METOPROLOL SUCCINATE ER 50 MG PO TB24
50.0000 mg | ORAL_TABLET | Freq: Two times a day (BID) | ORAL | Status: DC
  Administered 2022-11-26 (×2): 50 mg via ORAL
  Filled 2022-11-26 (×2): qty 1

## 2022-11-26 MED ORDER — IOHEXOL 350 MG/ML SOLN
75.0000 mL | Freq: Once | INTRAVENOUS | Status: AC | PRN
  Administered 2022-11-26: 75 mL via INTRAVENOUS

## 2022-11-26 MED ORDER — SACUBITRIL-VALSARTAN 24-26 MG PO TABS
1.0000 | ORAL_TABLET | Freq: Two times a day (BID) | ORAL | Status: DC
  Administered 2022-11-26: 1 via ORAL
  Filled 2022-11-26: qty 1

## 2022-11-26 MED ORDER — ACETAMINOPHEN 650 MG RE SUPP: 650.0000 mg | RECTAL | Status: DC | PRN

## 2022-11-26 MED ORDER — ACETAMINOPHEN 325 MG PO TABS: 650.0000 mg | ORAL_TABLET | ORAL | Status: DC | PRN

## 2022-11-26 MED ORDER — ASPIRIN 81 MG PO TBEC
81.0000 mg | DELAYED_RELEASE_TABLET | Freq: Every day | ORAL | Status: DC
  Administered 2022-11-26: 81 mg via ORAL
  Filled 2022-11-26: qty 1

## 2022-11-26 MED ORDER — ACETAMINOPHEN 160 MG/5ML PO SOLN: 650.0000 mg | ORAL | Status: DC | PRN

## 2022-11-26 MED ORDER — CLOPIDOGREL BISULFATE 75 MG PO TABS: 75.0000 mg | ORAL_TABLET | Freq: Every day | ORAL | 0 refills | Status: AC

## 2022-11-26 MED ORDER — CLOPIDOGREL BISULFATE 75 MG PO TABS: 75.0000 mg | ORAL_TABLET | Freq: Every day | ORAL | 0 refills | Status: DC

## 2022-11-26 MED ORDER — ROSUVASTATIN CALCIUM 20 MG PO TABS
40.0000 mg | ORAL_TABLET | Freq: Every day | ORAL | Status: DC
  Administered 2022-11-26: 40 mg via ORAL
  Filled 2022-11-26 (×2): qty 2

## 2022-11-26 MED ORDER — STROKE: EARLY STAGES OF RECOVERY BOOK: Freq: Once | Status: AC

## 2022-11-26 MED ORDER — SPIRONOLACTONE 25 MG PO TABS
25.0000 mg | ORAL_TABLET | Freq: Every day | ORAL | Status: DC
  Administered 2022-11-26: 25 mg via ORAL
  Filled 2022-11-26: qty 1

## 2022-11-26 NOTE — Progress Notes (Signed)
SLP Cancellation Note  Patient Details Name: Ronnie Mcintyre MRN: [id number] DOB: 14-Apr-1966   Cancelled treatment:       Reason Eval/Treat Not Completed: SLP screened, no needs identified, will sign off (chart reviewed; consulted NSG then met w/ pt in room.)  Pt denied any difficulty swallowing and is currently on a regular diet; tolerates swallowing pills w/ water per NSG.  Pt conversed in full conversation w/out expressive/receptive deficits noted; pt denied any speech-language deficits. Speech clear, intelligible. He c/o only having slight numbness in his R fingers.  No further skilled ST services indicated as pt appears at his baseline. Pt agreed. NSG to reconsult if any change in status while admitted.      Jerilynn Som, MS, CCC-SLP Speech Language Pathologist Rehab Services; [facility] [phone] (ascom) Olympia Adelsberger 11/26/2022, 11:43 AM

## 2022-11-26 NOTE — Progress Notes (Signed)
*  PRELIMINARY RESULTS* Echocardiogram 2D Echocardiogram has been performed.  Carolyne Fiscal 11/26/2022, 11:11 AM

## 2022-11-26 NOTE — Evaluation (Signed)
Physical Therapy Evaluation Patient Details Name: Ronnie Mcintyre MRN: [id number] DOB: 30-Jul-1965 Today's Date: 11/26/2022  History of Present Illness  57 y.o. male with medical history significant of non-Hodgkin's lymphoma as well as prior CABG in 2023.  Patient was in his usual state of health till approximately 6 PM when he had just done dishes and was working on his phone while sitting when he reports new sensation of numbness and loss of sensation of his right hand and forearm.  Imaging negative for acute infarct.  Clinical Impression  Pt pleasant and motivated with PT and apart from some mild but improving distal R UE numbness he reports feeling at his baseline.  He did well with balance challenges and though he displayed some mild weakness this was equal bilaterally.  Pt able to ambulate with appropriate speed, safety and confidence and negotiated up/down steps w/o issue.  Pt does not have any PT needs, safety to return home and in agreement with d/c from PT services.     Recommendations for follow up therapy are one component of a multi-disciplinary discharge planning process, led by the attending physician.  Recommendations may be updated based on patient status, additional functional criteria and insurance authorization.  Follow Up Recommendations       Assistance Recommended at Discharge None  Patient can return home with the following       Equipment Recommendations None recommended by PT  Recommendations for Other Services       Functional Status Assessment Patient has not had a recent decline in their functional status     Precautions / Restrictions Precautions Precautions: None Restrictions Weight Bearing Restrictions: No      Mobility  Bed Mobility Overal bed mobility: Independent                  Transfers Overall transfer level: Independent Equipment used: None                    Ambulation/Gait Ambulation/Gait assistance:  Independent Gait Distance (Feet): 250 Feet Assistive device: None         General Gait Details: Pt easily, safely and confidently able to ambulate the nurses' station w/o issue, at baseline community appropriate speed and cadence.  Stairs Stairs: Yes Stairs assistance: Independent Stair Management: No rails, Alternating pattern Number of Stairs: 4 General stair comments: easily able to reciprocally negotiate up/down steps w/o UE use  Wheelchair Mobility    Modified Rankin (Stroke Patients Only)       Balance Overall balance assessment: Independent                                           Pertinent Vitals/Pain Pain Assessment Pain Assessment: No/denies pain    Home Living Family/patient expects to be discharged to:: Private residence Living Arrangements: Alone Available Help at Discharge: Available PRN/intermittently   Home Access: Stairs to enter Entrance Stairs-Rails:  (yes) Entrance Stairs-Number of Steps: 2   Home Layout: One level Home Equipment: None      Prior Function Prior Level of Function : Independent/Modified Independent;Working/employed;Driving             Mobility Comments: Indepdendent with driving, working, meal prep, etc ADLs Comments: Independent     Hand Dominance        Extremity/Trunk Assessment   Upper Extremity Assessment Upper Extremity Assessment: Overall WFL for tasks assessed (  minimal residual numbness R distal UE)    Lower Extremity Assessment Lower Extremity Assessment: Overall WFL for tasks assessed       Communication   Communication: No difficulties  Cognition Arousal/Alertness: Awake/alert Behavior During Therapy: WFL for tasks assessed/performed Overall Cognitive Status: Within Functional Limits for tasks assessed                                          General Comments      Exercises     Assessment/Plan    PT Assessment Patient does not need any further PT  services  PT Problem List         PT Treatment Interventions      PT Goals (Current goals can be found in the Care Plan section)  Acute Rehab PT Goals Patient Stated Goal: Go home PT Goal Formulation: All assessment and education complete, DC therapy    Frequency       Co-evaluation               AM-PAC PT "6 Clicks" Mobility  Outcome Measure Help needed turning from your back to your side while in a flat bed without using bedrails?: None Help needed moving from lying on your back to sitting on the side of a flat bed without using bedrails?: None Help needed moving to and from a bed to a chair (including a wheelchair)?: None Help needed standing up from a chair using your arms (e.g., wheelchair or bedside chair)?: None Help needed to walk in hospital room?: None Help needed climbing 3-5 steps with a railing? : None 6 Click Score: 24    End of Session Equipment Utilized During Treatment: Gait belt Activity Tolerance: Patient tolerated treatment well Patient left: in bed;with call bell/phone within reach   PT Visit Diagnosis: Other symptoms and signs involving the nervous system (W09.811)    Time: 9147-8295 PT Time Calculation (min) (ACUTE ONLY): 13 min   Charges:   PT Evaluation $PT Eval Low Complexity: 1 Low          Malachi Pro, DPT 11/26/2022, 12:45 PM

## 2022-11-26 NOTE — Progress Notes (Signed)
Anticoagulation monitoring(Lovenox):  57 yo male ordered Lovenox 40 mg Q24h    Filed Weights   11/25/22 1940  Weight: 112.5 kg (248 lb)   BMI 33   Lab Results  Component Value Date   CREATININE 1.18 11/25/2022   CREATININE 1.00 06/23/2022   CREATININE 1.15 04/28/2022   Estimated Creatinine Clearance: 91.6 mL/min (by C-G formula based on SCr of 1.18 mg/dL). Hemoglobin & Hematocrit     Component Value Date/Time   HGB 17.1 (H) 11/25/2022 2002   HGB 11.7 (L) 02/10/2018 1610   HCT 51.8 11/25/2022 2002     Per Protocol for Patient with estCrcl > 30 ml/min and BMI > 30, will transition to Lovenox 57.5 mg Q24h.

## 2022-11-26 NOTE — Progress Notes (Signed)
OT Cancellation Note  Patient Details Name: Ronnie Mcintyre MRN: [id number] DOB: 07-Aug-1965   Cancelled Treatment:    Reason Eval/Treat Not Completed: OT screened, no needs identified, will sign off. Pt currently Ind in all functional tasks and mobility. He is reporting some mild numbness in R UE but remains functional. Pt does not need skilled OT intervention this admission. OT to complete order.   Jackquline Denmark, MS, OTR/L , CBIS ascom [phone]  11/26/22, 9:53 AM

## 2022-11-26 NOTE — Discharge Summary (Addendum)
Physician Discharge Summary   Patient: Ronnie Mcintyre MRN: [id number] DOB: 09-05-1965  Admit date:     11/25/2022  Discharge date: 11/26/22  Discharge Physician: Jowanda Heeg   PCP: Danella Penton, MD   Recommendations at discharge:   Take Plavix for 21 days and discontinue  Discharge Diagnoses: Principal Problem:   TIA (transient ischemic attack) Active Problems:   CAD (coronary artery disease) of artery bypass graft  Resolved Problems:   * No resolved hospital problems. *  Hospital Course:   Ronnie Mcintyre is a 57 y.o. male with medical history significant of non-Hodgkin's lymphoma as well as prior CABG in 2023.  Patient was in his usual state of health till approximately 6 PM when he had just done dishes and was working on his phone while sitting when he reports new sensation of numbness and loss of sensation of his right hand and forearm.  It is difficult for patient to report if he also had loss of sensation in the right arm.  The sensation lasted about 30 minutes and was progressive, insidious onset with no aggravating relieving factor.  There is no particular history to suggest that patient had loss of strength in the right upper extremity.  There was no vision changes no trauma no other focal weakness.  No prior similar episodes.  After about 30 minutes of persistent progressive loss of sensation, patient presented to [facility] ER.  Were workup fluids was initiated for TIA/stroke.   ER course is notable some right arm drift. But thereafter resolution of patient's rapid upper extremity weakness/numbness.  Patient has remained vitally stable, medical evaluation is sought.   Patient at this time reports minimal soreness of the right elbow area as well as right shoulder area.  Function is intact Assessment and Plan: TIA  Patient presents to the ER for evaluation of right arm numbness and paresthesia which has resolved. His initial CT scan of the head was negative for bleed and  MRI was negative for acute infarct Patient had a CT angiogram of the head and neck which did not show any LVO Appreciate neurology input, will discharge patient on dual antiplatelet therapy for 21 days and then aspirin 81 mg daily     Coronary artery disease status post recent CABG Continue aspirin, metoprolol and Crestor    Obesity (BMI 33) Complicates overall prognosis and care       Consultants: Neurology Procedures performed: CT scan of the head, CT angiogram, MRI Disposition: Home Diet recommendation:  Discharge Diet Orders (From admission, onward)     Start     Ordered   11/26/22 0000  Diet - low sodium heart healthy        11/26/22 1542           Cardiac diet DISCHARGE MEDICATION: Allergies as of 11/26/2022   No Known Allergies      Medication List     STOP taking these medications    carvedilol 12.5 MG tablet Commonly known as: COREG   ezetimibe 10 MG tablet Commonly known as: ZETIA   furosemide 40 MG tablet Commonly known as: LASIX   isosorbide mononitrate 30 MG 24 hr tablet Commonly known as: IMDUR   potassium chloride SA 20 MEQ tablet Commonly known as: KLOR-CON M       TAKE these medications    acetaminophen 325 MG tablet Commonly known as: TYLENOL Take 2 tablets (650 mg total) by mouth every 6 (six) hours as needed for mild pain (or Fever >/=  101).   aspirin EC 81 MG tablet Take 1 tablet (81 mg total) by mouth daily. Swallow whole.   clopidogrel 75 MG tablet Commonly known as: Plavix Take 1 tablet (75 mg total) by mouth daily.   metoprolol succinate 50 MG 24 hr tablet Commonly known as: TOPROL-XL Take 50 mg by mouth 2 (two) times daily.   rosuvastatin 40 MG tablet Commonly known as: CRESTOR Take 1 tablet (40 mg total) by mouth daily.   sacubitril-valsartan 24-26 MG Commonly known as: ENTRESTO Take 1 tablet by mouth 2 (two) times daily. What changed: Another medication with the same name was removed. Continue taking  this medication, and follow the directions you see here.   spironolactone 25 MG tablet Commonly known as: ALDACTONE TAKE 1/2 TABLET BY MOUTH DAILY What changed: how much to take        Discharge Exam: Filed Weights   11/25/22 1940  Weight: 112.5 kg  General: Patient in no distress speech sounds normal Respiratory; bilateral intravesicular Abdomen soft nontender Cardiovascular exam S1-S2 normal Neurologic exam: Alert awake oriented x 3 no focal motor deficit. Soft touch sensation is preserved over bilateral upper extremities and is comparable.   Condition at discharge: stable  The results of significant diagnostics from this hospitalization (including imaging, microbiology, ancillary and laboratory) are listed below for reference.   Imaging Studies: ECHOCARDIOGRAM COMPLETE  Result Date: 11/26/2022    ECHOCARDIOGRAM REPORT   Patient Name:   Ronnie Mcintyre Date of Exam: 11/26/2022 Medical Rec #:  [id number]         Height:       72.0 in Accession #:    [id number]        Weight:       248.0 lb Date of Birth:  22-Apr-1966         BSA:          2.335 m Patient Age:    57 years          BP:           121/91 mmHg Patient Gender: M                 HR:           89 bpm. Exam Location:  [facility] Procedure: 2D Echo, Cardiac Doppler, Color Doppler and Intracardiac            Opacification Agent Indications:     TIA  History:         Patient has prior history of Echocardiogram examinations, most                  recent 05/19/2022. Cardiomyopathy and CHF, CAD, Prior CABG,                  TIA, Signs/Symptoms:Chest Pain and Shortness of Breath; Risk                  Factors:Hypertension and Dyslipidemia. Large B-Cell Lymphoma.  Sonographer:     Mikki Harbor Referring Phys:  [id number] Ronnie Mcintyre Diagnosing Phys: Debbe Odea MD  Sonographer Comments: Technically difficult study due to poor echo windows. IMPRESSIONS  1. Left ventricular ejection fraction, by estimation, is 50 to 55%. The left ventricle  has low normal function. The left ventricle demonstrates regional wall motion abnormalities (see scoring diagram/findings for description). There is mild left ventricular hypertrophy. Left ventricular diastolic parameters were normal. There is moderate hypokinesis of the left ventricular, mid-apical anteroseptal wall.  2. Right ventricular  systolic function is low normal. The right ventricular size is normal.  3. The mitral valve is normal in structure. Mild mitral valve regurgitation.  4. The aortic valve is grossly normal. Aortic valve regurgitation is not visualized.  5. Aortic dilatation noted. There is mild dilatation of the aortic root, measuring 41 mm. There is mild dilatation of the ascending aorta, measuring 40 mm. FINDINGS  Left Ventricle: Left ventricular ejection fraction, by estimation, is 50 to 55%. The left ventricle has low normal function. The left ventricle demonstrates regional wall motion abnormalities. Moderate hypokinesis of the left ventricular, mid-apical anteroseptal wall. Definity contrast agent was given IV to delineate the left ventricular endocardial borders. The left ventricular internal cavity size was normal in size. There is mild left ventricular hypertrophy. Left ventricular diastolic parameters  were normal. Right Ventricle: The right ventricular size is normal. No increase in right ventricular wall thickness. Right ventricular systolic function is low normal. Left Atrium: Left atrial size was normal in size. Right Atrium: Right atrial size was normal in size. Pericardium: There is no evidence of pericardial effusion. Mitral Valve: The mitral valve is normal in structure. Mild mitral valve regurgitation. MV peak gradient, 2.4 mmHg. The mean mitral valve gradient is 1.0 mmHg. Tricuspid Valve: The tricuspid valve is normal in structure. Tricuspid valve regurgitation is not demonstrated. Aortic Valve: The aortic valve is grossly normal. Aortic valve regurgitation is not visualized.  Aortic valve mean gradient measures 2.0 mmHg. Aortic valve peak gradient measures 3.7 mmHg. Aortic valve area, by VTI measures 2.79 cm. Pulmonic Valve: The pulmonic valve was not well visualized. Pulmonic valve regurgitation is trivial. Aorta: Aortic dilatation noted. There is mild dilatation of the aortic root, measuring 41 mm. There is mild dilatation of the ascending aorta, measuring 40 mm. Venous: The inferior vena cava was not well visualized. IAS/Shunts: No atrial level shunt detected by color flow Doppler.  LEFT VENTRICLE PLAX 2D LVIDd:         4.70 cm   Diastology LVIDs:         3.30 cm   LV e' medial:    4.57 cm/s LV PW:         1.20 cm   LV E/e' medial:  13.1 LV IVS:        1.20 cm   LV e' lateral:   10.40 cm/s LVOT diam:     2.10 cm   LV E/e' lateral: 5.7 LV SV:         57 LV SV Index:   25 LVOT Area:     3.46 cm  RIGHT VENTRICLE RV Basal diam:  3.45 cm LEFT ATRIUM             Index        RIGHT ATRIUM           Index LA diam:        4.10 cm 1.76 cm/m   RA Area:     16.00 cm LA Vol (A2C):   41.2 ml 17.65 ml/m  RA Volume:   35.40 ml  15.16 ml/m LA Vol (A4C):   48.5 ml 20.78 ml/m LA Biplane Vol: 45.3 ml 19.40 ml/m  AORTIC VALVE                    PULMONIC VALVE AV Area (Vmax):    2.95 cm     PV Vmax:       1.14 m/s AV Area (Vmean):   2.83 cm  PV Peak grad:  5.2 mmHg AV Area (VTI):     2.79 cm AV Vmax:           96.50 cm/s AV Vmean:          69.800 cm/s AV VTI:            0.206 m AV Peak Grad:      3.7 mmHg AV Mean Grad:      2.0 mmHg LVOT Vmax:         82.20 cm/s LVOT Vmean:        57.100 cm/s LVOT VTI:          0.166 m LVOT/AV VTI ratio: 0.81  AORTA Ao Root diam: 4.10 cm Ao Asc diam:  4.00 cm MITRAL VALVE MV Area (PHT): 4.44 cm    SHUNTS MV Area VTI:   3.83 cm    Systemic VTI:  0.17 m MV Peak grad:  2.4 mmHg    Systemic Diam: 2.10 cm MV Mean grad:  1.0 mmHg MV Vmax:       0.78 m/s MV Vmean:      55.2 cm/s MV Decel Time: 171 msec MV E velocity: 59.70 cm/s MV A velocity: 63.40 cm/s MV E/A  ratio:  0.94 Debbe Odea MD Electronically signed by Debbe Odea MD Signature Date/Time: 11/26/2022/3:45:22 PM    Final    CT ANGIO HEAD W OR WO CONTRAST  Result Date: 11/26/2022 CLINICAL DATA:  Neuro deficit, acute, stroke suspected. EXAM: CT ANGIOGRAPHY HEAD TECHNIQUE: Multidetector CT imaging of the head was performed using the standard protocol during bolus administration of intravenous contrast. Multiplanar CT image reconstructions and MIPs were obtained to evaluate the vascular anatomy. RADIATION DOSE REDUCTION: This exam was performed according to the departmental dose-optimization program which includes automated exposure control, adjustment of the mA and/or kV according to patient size and/or use of iterative reconstruction technique. CONTRAST:  75mL OMNIPAQUE IOHEXOL 350 MG/ML SOLN COMPARISON:  Head CT and MRI 11/25/2022 FINDINGS: CT HEAD Brain: There is no evidence of an acute infarct, intracranial hemorrhage, mass, midline shift, or extra-axial fluid collection. The ventricles are normal in size. Vascular: Reported below. Skull: No fracture or suspicious osseous lesion. Sinuses: Mucous retention cyst in the left maxillary sinus. Clear mastoid air cells. Other: Unremarkable orbits. CTA HEAD Anterior circulation: ACAs and MCAs are patent without evidence of a proximal branch occlusion or significant proximal stenosis. No aneurysm is identified. Posterior circulation: The intracranial vertebral arteries are patent to the basilar with the left being strongly dominant. Patent PICA and SCA origins are seen bilaterally. The basilar artery is widely patent. There are diminutive right and large left posterior communicating arteries with hypoplasia of the left P1 segment. Both PCAs are patent without evidence of a significant proximal stenosis. No aneurysm is identified. Venous sinuses: Poorly evaluated due to arterial contrast timing. Anatomic variants: Fetal left PCA. Review of the MIP images  confirms the above findings. IMPRESSION: No large vessel occlusion, significant stenosis, or aneurysm. Electronically Signed   By: Sebastian Ache M.D.   On: 11/26/2022 15:33   US Carotid Bilateral (at [facility] and [facility] only)  Result Date: 11/26/2022 CLINICAL DATA:  Transient ischemic attack EXAM: BILATERAL CAROTID DUPLEX ULTRASOUND TECHNIQUE: Wallace Cullens scale imaging, color Doppler and duplex ultrasound were performed of bilateral carotid and vertebral arteries in the neck. COMPARISON:  None Available. FINDINGS: Criteria: Quantification of carotid stenosis is based on velocity parameters that correlate the residual internal carotid diameter with NASCET-based stenosis levels, using the diameter  of the distal internal carotid lumen as the denominator for stenosis measurement. The following velocity measurements were obtained: RIGHT ICA: 57 cm/sec CCA: 71 cm/sec SYSTOLIC ICA/CCA RATIO:  0.9 ECA: 100 cm/sec LEFT ICA: 55 cm/sec CCA: 76 cm/sec SYSTOLIC ICA/CCA RATIO:  0.8 ECA: 69 cm/sec RIGHT CAROTID ARTERY: No atherosclerotic plaque identified RIGHT VERTEBRAL ARTERY:  Antegrade flow LEFT CAROTID ARTERY:  No atherosclerotic plaque identified LEFT VERTEBRAL ARTERY:  Antegrade flow IMPRESSION: Normal ultrasound assessment of the carotid arteries. Electronically Signed   By: Deatra Robinson M.D.   On: 11/26/2022 02:42   DG Chest 2 View  Result Date: 11/26/2022 CLINICAL DATA:  Sudden onset right arm numbness. History of non-Hodgkin's lymphoma. Query pleural effusion. EXAM: CHEST - 2 VIEW COMPARISON:  Radiographs 03/24/2022 FINDINGS: Low lung volumes accentuate cardiomediastinal silhouette and pulmonary vascularity. Stable heart size. Sternotomy and CABG. No focal consolidation, pleural effusion, or pneumothorax. No displaced rib fractures. IMPRESSION: No active cardiopulmonary disease. Electronically Signed   By: Minerva Fester M.D.   On: 11/26/2022 00:56   MR Cervical Spine Wo Contrast  Result Date: 11/25/2022 CLINICAL DATA:   Acute cervical myelopathy EXAM: MRI CERVICAL SPINE WITHOUT CONTRAST TECHNIQUE: Multiplanar, multisequence MR imaging of the cervical spine was performed. No intravenous contrast was administered. COMPARISON:  None Available. FINDINGS: Alignment: Reversal of normal cervical lordosis. No static subluxation. Vertebrae: No fracture, evidence of discitis, or bone lesion. Cord: Normal signal and morphology. Posterior Fossa, vertebral arteries, paraspinal tissues: Negative. Disc levels: C1-2: Unremarkable. C2-3: Right facet hypertrophy. There is no spinal canal stenosis. No neural foraminal stenosis. C3-4: Mild right facet hypertrophy. There is no spinal canal stenosis. No neural foraminal stenosis. C4-5: Normal disc space and facet joints. There is no spinal canal stenosis. No neural foraminal stenosis. C5-6: Small disc bulge. Mild spinal canal stenosis. No neural foraminal stenosis. C6-7: Normal disc space and facet joints. There is no spinal canal stenosis. No neural foraminal stenosis. C7-T1: Small left subarticular disc protrusion. There is no spinal canal stenosis. No neural foraminal stenosis. IMPRESSION: 1. No acute abnormality of the cervical spine. 2. Mild spinal canal stenosis at C5-6. 3. Small left subarticular disc protrusion at C7-T1 without associated stenosis. Electronically Signed   By: Deatra Robinson M.D.   On: 11/25/2022 23:33   MR BRAIN WO CONTRAST  Result Date: 11/25/2022 CLINICAL DATA:  Acute neurologic deficit EXAM: MRI HEAD WITHOUT CONTRAST TECHNIQUE: Multiplanar, multiecho pulse sequences of the brain and surrounding structures were obtained without intravenous contrast. COMPARISON:  10/02/2021 FINDINGS: Brain: No acute infarct, mass effect or extra-axial collection. No acute or chronic hemorrhage. Minimal multifocal hyperintense T2-weight signal within the white matter. The midline structures are normal. Vascular: Major flow voids are preserved. Skull and upper cervical spine: Normal calvarium  and skull base. Visualized upper cervical spine and soft tissues are normal. Sinuses/Orbits:Left maxillary sinus retention cyst.  Normal orbits. IMPRESSION: No acute intracranial abnormality. Electronically Signed   By: Deatra Robinson M.D.   On: 11/25/2022 23:27   CT HEAD CODE STROKE WO CONTRAST  Result Date: 11/25/2022 CLINICAL DATA:  Code stroke.  Right-sided numbness EXAM: CT HEAD WITHOUT CONTRAST TECHNIQUE: Contiguous axial images were obtained from the base of the skull through the vertex without intravenous contrast. RADIATION DOSE REDUCTION: This exam was performed according to the departmental dose-optimization program which includes automated exposure control, adjustment of the mA and/or kV according to patient size and/or use of iterative reconstruction technique. COMPARISON:  None Available. FINDINGS: Brain: There is no mass, hemorrhage or extra-axial  collection. The size and configuration of the ventricles and extra-axial CSF spaces are normal. The brain parenchyma is normal, without evidence of acute or chronic infarction. Vascular: No abnormal hyperdensity of the major intracranial arteries or dural venous sinuses. No intracranial atherosclerosis. Skull: The visualized skull base, calvarium and extracranial soft tissues are normal. Sinuses/Orbits: No fluid levels or advanced mucosal thickening of the visualized paranasal sinuses. No mastoid or middle ear effusion. The orbits are normal. ASPECTS [facility] Stroke Program Early CT Score) - Ganglionic level infarction (caudate, lentiform nuclei, internal capsule, insula, M1-M3 cortex): 7 - Supraganglionic infarction (M4-M6 cortex): 3 Total score (0-10 with 10 being normal): 10 IMPRESSION: 1. No acute intracranial abnormality. 2. ASPECTS is 10. These results were called by telephone at the time of interpretation on 11/25/2022 at 8:02 pm to provider Shaune Pollack , who verbally acknowledged these results. Electronically Signed   By: Deatra Robinson M.D.   On:  11/25/2022 20:03    Microbiology: Results for orders placed or performed during the hospital encounter of 10/01/21  Resp Panel by RT-PCR (Flu A&B, Covid) Urine, Clean Catch     Status: None   Collection Time: 10/02/21  1:11 AM   Specimen: Urine, Clean Catch; Nasopharyngeal(NP) swabs in vial transport medium  Result Value Ref Range Status   SARS Coronavirus 2 by RT PCR NEGATIVE NEGATIVE Final    Comment: (NOTE) SARS-CoV-2 target nucleic acids are NOT DETECTED.  The SARS-CoV-2 RNA is generally detectable in upper respiratory specimens during the acute phase of infection. The lowest concentration of SARS-CoV-2 viral copies this assay can detect is 138 copies/mL. A negative result does not preclude SARS-Cov-2 infection and should not be used as the sole basis for treatment or other patient management decisions. A negative result may occur with  improper specimen collection/handling, submission of specimen other than nasopharyngeal swab, presence of viral mutation(s) within the areas targeted by this assay, and inadequate number of viral copies(<138 copies/mL). A negative result must be combined with clinical observations, patient history, and epidemiological information. The expected result is Negative.  Fact Sheet for Patients:  BloggerCourse.com  Fact Sheet for Healthcare Providers:  SeriousBroker.it  This test is no t yet approved or cleared by the [place] FDA and  has been authorized for detection and/or diagnosis of SARS-CoV-2 by FDA under an Emergency Use Authorization (EUA). This EUA will remain  in effect (meaning this test can be used) for the duration of the COVID-19 declaration under Section 564(b)(1) of the Act, 21 U.S.C.section 360bbb-3(b)(1), unless the authorization is terminated  or revoked sooner.       Influenza A by PCR NEGATIVE NEGATIVE Final   Influenza B by PCR NEGATIVE NEGATIVE Final    Comment:  (NOTE) The Xpert Xpress SARS-CoV-2/FLU/RSV plus assay is intended as an aid in the diagnosis of influenza from Nasopharyngeal swab specimens and should not be used as a sole basis for treatment. Nasal washings and aspirates are unacceptable for Xpert Xpress SARS-CoV-2/FLU/RSV testing.  Fact Sheet for Patients: BloggerCourse.com  Fact Sheet for Healthcare Providers: SeriousBroker.it  This test is not yet approved or cleared by the [place] FDA and has been authorized for detection and/or diagnosis of SARS-CoV-2 by FDA under an Emergency Use Authorization (EUA). This EUA will remain in effect (meaning this test can be used) for the duration of the COVID-19 declaration under Section 564(b)(1) of the Act, 21 U.S.C. section 360bbb-3(b)(1), unless the authorization is terminated or revoked.  Performed at [facility], [address] [place], [place] [address]     Labs: CBC: Recent Labs  Lab 11/25/22 2002 11/26/22 0511  WBC 12.0* 11.2*  NEUTROABS 9.1*  --   HGB 17.1* 15.8  HCT 51.8 48.4  MCV 84.6 86.0  PLT 248 201   Basic Metabolic Panel: Recent Labs  Lab 11/25/22 2002 11/26/22 0511  NA 138  --   K 3.7  --   CL 101  --   CO2 28  --   GLUCOSE 106*  --   BUN 20  --   CREATININE 1.18 1.08  CALCIUM 9.2  --    Liver Function Tests: Recent Labs  Lab 11/25/22 2002  AST 25  ALT 26  ALKPHOS 68  BILITOT 1.4*  PROT 7.2  ALBUMIN 4.0   CBG: Recent Labs  Lab 11/25/22 1943 11/25/22 2000  GLUCAP 106* 97    Discharge time spent: greater than 30 minutes.  Signed: Lucile Shutters, MD [facility] 11/26/2022

## 2022-12-23 ENCOUNTER — Inpatient Hospital Stay: Payer: Commercial Managed Care - PPO | Attending: Oncology

## 2022-12-23 ENCOUNTER — Encounter: Payer: Self-pay | Admitting: Oncology

## 2022-12-23 ENCOUNTER — Inpatient Hospital Stay (HOSPITAL_BASED_OUTPATIENT_CLINIC_OR_DEPARTMENT_OTHER): Payer: Commercial Managed Care - PPO | Admitting: Oncology

## 2022-12-23 VITALS — BP 105/77 | HR 82 | Temp 97.8°F | Resp 18 | Ht 72.0 in | Wt 253.0 lb

## 2022-12-23 DIAGNOSIS — Z08 Encounter for follow-up examination after completed treatment for malignant neoplasm: Secondary | ICD-10-CM

## 2022-12-23 DIAGNOSIS — Z8579 Personal history of other malignant neoplasms of lymphoid, hematopoietic and related tissues: Secondary | ICD-10-CM

## 2022-12-23 DIAGNOSIS — C8330 Diffuse large B-cell lymphoma, unspecified site: Secondary | ICD-10-CM | POA: Insufficient documentation

## 2022-12-23 LAB — COMPREHENSIVE METABOLIC PANEL
ALT: 26 U/L (ref 0–44)
AST: 23 U/L (ref 15–41)
Albumin: 4.1 g/dL (ref 3.5–5.0)
Alkaline Phosphatase: 66 U/L (ref 38–126)
Anion gap: 9 (ref 5–15)
BUN: 15 mg/dL (ref 6–20)
CO2: 27 mmol/L (ref 22–32)
Calcium: 9.2 mg/dL (ref 8.9–10.3)
Chloride: 100 mmol/L (ref 98–111)
Creatinine, Ser: 1.08 mg/dL (ref 0.61–1.24)
GFR, Estimated: >60 mL/min (ref >60)
Glucose, Bld: 91 mg/dL (ref 70–99)
Potassium: 5.1 mmol/L (ref 3.5–5.1)
Sodium: 136 mmol/L (ref 135–145)
Total Bilirubin: 1.5 mg/dL — ABNORMAL HIGH (ref 0.3–1.2)
Total Protein: 7 g/dL (ref 6.5–8.1)

## 2022-12-23 LAB — CBC WITH DIFFERENTIAL/PLATELET
Abs Immature Granulocytes: 0.03 10*3/uL (ref 0.00–0.07)
Basophils Absolute: 0.1 10*3/uL (ref 0.0–0.1)
Basophils Relative: 1 %
Eosinophils Absolute: 0.1 10*3/uL (ref 0.0–0.5)
Eosinophils Relative: 2 %
HCT: 50.9 % (ref 39.0–52.0)
Hemoglobin: 17 g/dL (ref 13.0–17.0)
Immature Granulocytes: 0 %
Lymphocytes Relative: 10 %
Lymphs Abs: 0.8 10*3/uL (ref 0.7–4.0)
MCH: 28.4 pg (ref 26.0–34.0)
MCHC: 33.4 g/dL (ref 30.0–36.0)
MCV: 85.1 fL (ref 80.0–100.0)
Monocytes Absolute: 0.7 10*3/uL (ref 0.1–1.0)
Monocytes Relative: 9 %
Neutro Abs: 6.4 10*3/uL (ref 1.7–7.7)
Neutrophils Relative %: 78 %
Platelets: 197 10*3/uL (ref 150–400)
RBC: 5.98 MIL/uL — ABNORMAL HIGH (ref 4.22–5.81)
RDW: 15.2 % (ref 11.5–15.5)
WBC: 8.1 10*3/uL (ref 4.0–10.5)
nRBC: 0 % (ref 0.0–0.2)

## 2022-12-23 LAB — LACTATE DEHYDROGENASE: LDH: 132 U/L (ref 98–192)

## 2022-12-23 NOTE — Progress Notes (Signed)
Hematology/Oncology Consult note [facility]  [phone] [phone]  Patient Care Team: Danella Penton, MD as PCP - General (Internal Medicine) Iran Ouch, MD as PCP - Cardiology (Cardiology) Creig Hines, MD as Consulting Physician (Hematology and Oncology)   Name of the patient: Ronnie Mcintyre  [id number]  03-02-1966   Date of visit: 12/23/22  Diagnosis- history of large B-cell lymphoma 2019 currently in CR 1     Chief complaint/ Reason for visit-routine follow-up of diffuse large B-cell lymphoma  Heme/Onc history: Ronnie Mcintyre is a 57 y.o. 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  [facility] pathology revealed necrotic tissue.  [facility] consultation revealed neoplastic cells are positive for CD20, BCL-6, MUM-1, and BCL-2. The neoplastic cells were negative for CD30, CD3, , CD10, c-MYC, EBV ISH, AE1/3, and PAX-8. Ki67 is 60-70%. 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 The absence of CD30 expression makes primary mediastinal large B-cell lymphoma less likely. FISH for MYC rearrangement was negative. IPI score, age adjusted IPI, are low (1) and NCCN IPI score is low-intermediate (2).   He presented with upper chest fullness with radiation to his neck and arm.  He has had B symptoms (sweats and weight loss) worrisome for lymphoma.  LDH is 273.  Uric acid was normal.  Beta-HCG and AFP were normal on 01/16/2018.  On   Bone marrow biopsy on 02/03/2018 revealed a slightly hypercellular bone marrow for age with trilineage hematopoiesis.  There was no monoclonal B-cell population or abnormal T-cell phenotype identified.  Cytogenetics were normal (46, XY).   He received 6 cycles of RCHOP chemotherapy (02/16/2018 - 06/10/2018) with Neulasta support.  He received radiation (3600 cGy in 18 fractions) from 07/27/2018 - 08/19/2018.   Patient has  had surveillance scans up until November 2021 Which showed small residual MR for soft tissue density in the mediastinum measuring 2.4 cm x 1.2 cm with no significant change over the last 1 year.  Interval history-patient had triple-vessel bypass surgery in December 2023.  He is doing well from a cardiac standpoint and denies any significant fatigue or exertional shortness of breath.  Appetite is good although he has lost about 7 pounds since his bypass surgery   ECOG PS- 1 Pain scale- 0   Review of systems- Review of Systems  Constitutional:  Negative for chills, fever, malaise/fatigue and weight loss.  HENT:  Negative for congestion, ear discharge and nosebleeds.   Eyes:  Negative for blurred vision.  Respiratory:  Negative for cough, hemoptysis, sputum production, shortness of breath and wheezing.   Cardiovascular:  Negative for chest pain, palpitations, orthopnea and claudication.  Gastrointestinal:  Negative for abdominal pain, blood in stool, constipation, diarrhea, heartburn, melena, nausea and vomiting.  Genitourinary:  Negative for dysuria, flank pain, frequency, hematuria and urgency.  Musculoskeletal:  Negative for back pain, joint pain and myalgias.  Skin:  Negative for rash.  Neurological:  Negative for dizziness, tingling, focal weakness, seizures, weakness and headaches.  Endo/Heme/Allergies:  Does not bruise/bleed easily.  Psychiatric/Behavioral:  Negative for depression and suicidal ideas. The patient does not have insomnia.       No Known Allergies   Past Medical History:  Diagnosis Date   Cancer (HCC) 12/2017   Non-Hogkins lumphoma   Hypertension    Non Hodgkin's lymphoma (HCC)    TIA (transient ischemic attack)  Past Surgical History:  Procedure Laterality Date   ANKLE SURGERY Right    LEFT HEART CATH AND CORONARY ANGIOGRAPHY N/A 05/05/2022   Procedure: LEFT HEART CATH AND CORONARY ANGIOGRAPHY;  Surgeon: Iran Ouch, MD;  Location: [facility] INVASIVE  CV LAB;  Service: Cardiovascular;  Laterality: N/A;   PORTACATH PLACEMENT Right 02/11/2018   Procedure: INSERTION PORT-A-CATH;  Surgeon: Leafy Ro, MD;  Location: [facility] ORS;  Service: General;  Laterality: Right;    Social History   Socioeconomic History   Marital status: Single    Spouse name: Not on file   Number of children: 1   Years of education: Not on file   Highest education level: Not on file  Occupational History   Not on file  Tobacco Use   Smoking status: Never   Smokeless tobacco: Never  Vaping Use   Vaping Use: Never used  Substance and Sexual Activity   Alcohol use: Yes    Comment: rarely   Drug use: No   Sexual activity: Not Currently  Other Topics Concern   Not on file  Social History Narrative   Right handed   Caffeine oz per day    Son lives in [place]the country[place] 55 y.o.   Social Determinants of Health   Financial Resource Strain: Not on file  Food Insecurity: No Food Insecurity (11/26/2022)   Hunger Vital Sign    Worried About Running Out of Food in the Last Year: Never true    Ran Out of Food in the Last Year: Never true  Transportation Needs: No Transportation Needs (11/26/2022)   PRAPARE - Administrator, Civil Service (Medical): No    Lack of Transportation (Non-Medical): No  Physical Activity: Not on file  Stress: Not on file  Social Connections: Not on file  Intimate Partner Violence: Patient Declined (11/26/2022)   Humiliation, Afraid, Rape, and Kick questionnaire    Fear of Current or Ex-Partner: Patient declined    Emotionally Abused: Patient declined    Physically Abused: Patient declined    Sexually Abused: Patient declined    Family History  Problem Relation Age of Onset   Hypertension Mother    Hypertension Father    Multiple myeloma Father    Transient ischemic attack Father      Current Outpatient Medications:    acetaminophen (TYLENOL) 325 MG tablet, Take 2 tablets (650 mg total) by mouth every 6 (six)  hours as needed for mild pain (or Fever >/= 101)., Disp: , Rfl:    aspirin EC 81 MG tablet, Take 1 tablet (81 mg total) by mouth daily. Swallow whole., Disp: , Rfl:    clopidogrel (PLAVIX) 75 MG tablet, Take 1 tablet (75 mg total) by mouth daily., Disp: 21 tablet, Rfl: 0   metoprolol succinate (TOPROL-XL) 50 MG 24 hr tablet, Take 50 mg by mouth 2 (two) times daily., Disp: , Rfl:    rosuvastatin (CRESTOR) 40 MG tablet, Take 1 tablet (40 mg total) by mouth daily., Disp: 90 tablet, Rfl: 2   sacubitril-valsartan (ENTRESTO) 24-26 MG, Take 1 tablet by mouth 2 (two) times daily., Disp: , Rfl:    spironolactone (ALDACTONE) 25 MG tablet, TAKE 1/2 TABLET BY MOUTH DAILY (Patient taking differently: Take 25 mg by mouth daily.), Disp: 45 tablet, Rfl: 0  Physical exam:  Vitals:   12/23/22 1111  BP: 105/77  Pulse: 82  Resp: 18  Temp: 97.8 F (36.6 C)  TempSrc: Tympanic  SpO2: 100%  Weight: 253 lb (114.8  kg)  Height: 6' (1.829 m)   Physical Exam HENT:     Head: Normocephalic and atraumatic.  Cardiovascular:     Rate and Rhythm: Normal rate and regular rhythm.     Heart sounds: Normal heart sounds.     Comments: Central scar of bypass surgery seen Pulmonary:     Effort: Pulmonary effort is normal.     Breath sounds: Normal breath sounds.  Abdominal:     General: Bowel sounds are normal.     Palpations: Abdomen is soft.  Musculoskeletal:     Cervical back: Normal range of motion.  Lymphadenopathy:     Comments: No palpable cervical, supraclavicular, axillary or inguinal adenopathy    Skin:    General: Skin is warm and dry.  Neurological:     Mental Status: He is alert and oriented to person, place, and time.         Latest Ref Rng & Units 12/23/2022   10:52 AM  CMP  Glucose 70 - 99 mg/dL 91   BUN 6 - 20 mg/dL 15   Creatinine 6.21 - 1.24 mg/dL 3.08   Sodium 657 - 846 mmol/L 136   Potassium 3.5 - 5.1 mmol/L 5.1   Chloride 98 - 111 mmol/L 100   CO2 22 - 32 mmol/L 27   Calcium 8.9 -  10.3 mg/dL 9.2   Total Protein 6.5 - 8.1 g/dL 7.0   Total Bilirubin 0.3 - 1.2 mg/dL 1.5   Alkaline Phos 38 - 126 U/L 66   AST 15 - 41 U/L 23   ALT 0 - 44 U/L 26       Latest Ref Rng & Units 12/23/2022   10:52 AM  CBC  WBC 4.0 - 10.5 K/uL 8.1   Hemoglobin 13.0 - 17.0 g/dL 96.2   Hematocrit 95.2 - 52.0 % 50.9   Platelets 150 - 400 K/uL 197     No images are attached to the encounter.  ECHOCARDIOGRAM COMPLETE  Result Date: 11/26/2022    ECHOCARDIOGRAM REPORT   Patient Name:   Joannie Springs Date of Exam: 11/26/2022 Medical Rec #:  [id number]         Height:       72.0 in Accession #:    [id number]        Weight:       248.0 lb Date of Birth:  [birth year]         BSA:          2.335 m Patient Age:    57 years          BP:           121/91 mmHg Patient Gender: M                 HR:           89 bpm. Exam Location:  [facility] Procedure: 2D Echo, Cardiac Doppler, Color Doppler and Intracardiac            Opacification Agent Indications:     TIA  History:         Patient has prior history of Echocardiogram examinations, most                  recent 05/19/2022. Cardiomyopathy and CHF, CAD, Prior CABG,                  TIA, Signs/Symptoms:Chest Pain and Shortness of Breath; Risk  Factors:Hypertension and Dyslipidemia. Large B-Cell Lymphoma.  Sonographer:     Mikki Harbor Referring Phys:  [id number] [facility] GOEL Diagnosing Phys: Debbe Odea MD  Sonographer Comments: Technically difficult study due to poor echo windows. IMPRESSIONS  1. Left ventricular ejection fraction, by estimation, is 50 to 55%. The left ventricle has low normal function. The left ventricle demonstrates regional wall motion abnormalities (see scoring diagram/findings for description). There is mild left ventricular hypertrophy. Left ventricular diastolic parameters were normal. There is moderate hypokinesis of the left ventricular, mid-apical anteroseptal wall.  2. Right ventricular systolic function is low normal. The right  ventricular size is normal.  3. The mitral valve is normal in structure. Mild mitral valve regurgitation.  4. The aortic valve is grossly normal. Aortic valve regurgitation is not visualized.  5. Aortic dilatation noted. There is mild dilatation of the aortic root, measuring 41 mm. There is mild dilatation of the ascending aorta, measuring 40 mm. FINDINGS  Left Ventricle: Left ventricular ejection fraction, by estimation, is 50 to 55%. The left ventricle has low normal function. The left ventricle demonstrates regional wall motion abnormalities. Moderate hypokinesis of the left ventricular, mid-apical anteroseptal wall. Definity contrast agent was given IV to delineate the left ventricular endocardial borders. The left ventricular internal cavity size was normal in size. There is mild left ventricular hypertrophy. Left ventricular diastolic parameters  were normal. Right Ventricle: The right ventricular size is normal. No increase in right ventricular wall thickness. Right ventricular systolic function is low normal. Left Atrium: Left atrial size was normal in size. Right Atrium: Right atrial size was normal in size. Pericardium: There is no evidence of pericardial effusion. Mitral Valve: The mitral valve is normal in structure. Mild mitral valve regurgitation. MV peak gradient, 2.4 mmHg. The mean mitral valve gradient is 1.0 mmHg. Tricuspid Valve: The tricuspid valve is normal in structure. Tricuspid valve regurgitation is not demonstrated. Aortic Valve: The aortic valve is grossly normal. Aortic valve regurgitation is not visualized. Aortic valve mean gradient measures 2.0 mmHg. Aortic valve peak gradient measures 3.7 mmHg. Aortic valve area, by VTI measures 2.79 cm. Pulmonic Valve: The pulmonic valve was not well visualized. Pulmonic valve regurgitation is trivial. Aorta: Aortic dilatation noted. There is mild dilatation of the aortic root, measuring 41 mm. There is mild dilatation of the ascending aorta,  measuring 40 mm. Venous: The inferior vena cava was not well visualized. IAS/Shunts: No atrial level shunt detected by color flow Doppler.  LEFT VENTRICLE PLAX 2D LVIDd:         4.70 cm   Diastology LVIDs:         3.30 cm   LV e' medial:    4.57 cm/s LV PW:         1.20 cm   LV E/e' medial:  13.1 LV IVS:        1.20 cm   LV e' lateral:   10.40 cm/s LVOT diam:     2.10 cm   LV E/e' lateral: 5.7 LV SV:         57 LV SV Index:   25 LVOT Area:     3.46 cm  RIGHT VENTRICLE RV Basal diam:  3.45 cm LEFT ATRIUM             Index        RIGHT ATRIUM           Index LA diam:        4.10 cm 1.76 cm/m   RA Area:  16.00 cm LA Vol (A2C):   41.2 ml 17.65 ml/m  RA Volume:   35.40 ml  15.16 ml/m LA Vol (A4C):   48.5 ml 20.78 ml/m LA Biplane Vol: 45.3 ml 19.40 ml/m  AORTIC VALVE                    PULMONIC VALVE AV Area (Vmax):    2.95 cm     PV Vmax:       1.14 m/s AV Area (Vmean):   2.83 cm     PV Peak grad:  5.2 mmHg AV Area (VTI):     2.79 cm AV Vmax:           96.50 cm/s AV Vmean:          69.800 cm/s AV VTI:            0.206 m AV Peak Grad:      3.7 mmHg AV Mean Grad:      2.0 mmHg LVOT Vmax:         82.20 cm/s LVOT Vmean:        57.100 cm/s LVOT VTI:          0.166 m LVOT/AV VTI ratio: 0.81  AORTA Ao Root diam: 4.10 cm Ao Asc diam:  4.00 cm MITRAL VALVE MV Area (PHT): 4.44 cm    SHUNTS MV Area VTI:   3.83 cm    Systemic VTI:  0.17 m MV Peak grad:  2.4 mmHg    Systemic Diam: 2.10 cm MV Mean grad:  1.0 mmHg MV Vmax:       0.78 m/s MV Vmean:      55.2 cm/s MV Decel Time: 171 msec MV E velocity: 59.70 cm/s MV A velocity: 63.40 cm/s MV E/A ratio:  0.94 Debbe Odea MD Electronically signed by Debbe Odea MD Signature Date/Time: 11/26/2022/3:45:22 PM    Final    CT ANGIO HEAD W OR WO CONTRAST  Result Date: 11/26/2022 CLINICAL DATA:  Neuro deficit, acute, stroke suspected. EXAM: CT ANGIOGRAPHY HEAD TECHNIQUE: Multidetector CT imaging of the head was performed using the standard protocol during bolus  administration of intravenous contrast. Multiplanar CT image reconstructions and MIPs were obtained to evaluate the vascular anatomy. RADIATION DOSE REDUCTION: This exam was performed according to the departmental dose-optimization program which includes automated exposure control, adjustment of the mA and/or kV according to patient size and/or use of iterative reconstruction technique. CONTRAST:  75mL OMNIPAQUE IOHEXOL 350 MG/ML SOLN COMPARISON:  Head CT and MRI 11/25/2022 FINDINGS: CT HEAD Brain: There is no evidence of an acute infarct, intracranial hemorrhage, mass, midline shift, or extra-axial fluid collection. The ventricles are normal in size. Vascular: Reported below. Skull: No fracture or suspicious osseous lesion. Sinuses: Mucous retention cyst in the left maxillary sinus. Clear mastoid air cells. Other: Unremarkable orbits. CTA HEAD Anterior circulation: ACAs and MCAs are patent without evidence of a proximal branch occlusion or significant proximal stenosis. No aneurysm is identified. Posterior circulation: The intracranial vertebral arteries are patent to the basilar with the left being strongly dominant. Patent PICA and SCA origins are seen bilaterally. The basilar artery is widely patent. There are diminutive right and large left posterior communicating arteries with hypoplasia of the left P1 segment. Both PCAs are patent without evidence of a significant proximal stenosis. No aneurysm is identified. Venous sinuses: Poorly evaluated due to arterial contrast timing. Anatomic variants: Fetal left PCA. Review of the MIP images confirms the above findings. IMPRESSION: No large vessel  occlusion, significant stenosis, or aneurysm. Electronically Signed   By: Sebastian Ache M.D.   On: 11/26/2022 15:33   US Carotid Bilateral (at [facility] and [facility] only)  Result Date: 11/26/2022 CLINICAL DATA:  Transient ischemic attack EXAM: BILATERAL CAROTID DUPLEX ULTRASOUND TECHNIQUE: Wallace Cullens scale imaging, color Doppler and  duplex ultrasound were performed of bilateral carotid and vertebral arteries in the neck. COMPARISON:  None Available. FINDINGS: Criteria: Quantification of carotid stenosis is based on velocity parameters that correlate the residual internal carotid diameter with NASCET-based stenosis levels, using the diameter of the distal internal carotid lumen as the denominator for stenosis measurement. The following velocity measurements were obtained: RIGHT ICA: 57 cm/sec CCA: 71 cm/sec SYSTOLIC ICA/CCA RATIO:  0.9 ECA: 100 cm/sec LEFT ICA: 55 cm/sec CCA: 76 cm/sec SYSTOLIC ICA/CCA RATIO:  0.8 ECA: 69 cm/sec RIGHT CAROTID ARTERY: No atherosclerotic plaque identified RIGHT VERTEBRAL ARTERY:  Antegrade flow LEFT CAROTID ARTERY:  No atherosclerotic plaque identified LEFT VERTEBRAL ARTERY:  Antegrade flow IMPRESSION: Normal ultrasound assessment of the carotid arteries. Electronically Signed   By: Deatra Robinson M.D.   On: 11/26/2022 02:42   DG Chest 2 View  Result Date: 11/26/2022 CLINICAL DATA:  Sudden onset right arm numbness. History of non-Hodgkin's lymphoma. Query pleural effusion. EXAM: CHEST - 2 VIEW COMPARISON:  Radiographs 03/24/2022 FINDINGS: Low lung volumes accentuate cardiomediastinal silhouette and pulmonary vascularity. Stable heart size. Sternotomy and CABG. No focal consolidation, pleural effusion, or pneumothorax. No displaced rib fractures. IMPRESSION: No active cardiopulmonary disease. Electronically Signed   By: Minerva Fester M.D.   On: 11/26/2022 00:56   MR Cervical Spine Wo Contrast  Result Date: 11/25/2022 CLINICAL DATA:  Acute cervical myelopathy EXAM: MRI CERVICAL SPINE WITHOUT CONTRAST TECHNIQUE: Multiplanar, multisequence MR imaging of the cervical spine was performed. No intravenous contrast was administered. COMPARISON:  None Available. FINDINGS: Alignment: Reversal of normal cervical lordosis. No static subluxation. Vertebrae: No fracture, evidence of discitis, or bone lesion. Cord: Normal  signal and morphology. Posterior Fossa, vertebral arteries, paraspinal tissues: Negative. Disc levels: C1-2: Unremarkable. C2-3: Right facet hypertrophy. There is no spinal canal stenosis. No neural foraminal stenosis. C3-4: Mild right facet hypertrophy. There is no spinal canal stenosis. No neural foraminal stenosis. C4-5: Normal disc space and facet joints. There is no spinal canal stenosis. No neural foraminal stenosis. C5-6: Small disc bulge. Mild spinal canal stenosis. No neural foraminal stenosis. C6-7: Normal disc space and facet joints. There is no spinal canal stenosis. No neural foraminal stenosis. C7-T1: Small left subarticular disc protrusion. There is no spinal canal stenosis. No neural foraminal stenosis. IMPRESSION: 1. No acute abnormality of the cervical spine. 2. Mild spinal canal stenosis at C5-6. 3. Small left subarticular disc protrusion at C7-T1 without associated stenosis. Electronically Signed   By: Deatra Robinson M.D.   On: 11/25/2022 23:33   MR BRAIN WO CONTRAST  Result Date: 11/25/2022 CLINICAL DATA:  Acute neurologic deficit EXAM: MRI HEAD WITHOUT CONTRAST TECHNIQUE: Multiplanar, multiecho pulse sequences of the brain and surrounding structures were obtained without intravenous contrast. COMPARISON:  10/02/2021 FINDINGS: Brain: No acute infarct, mass effect or extra-axial collection. No acute or chronic hemorrhage. Minimal multifocal hyperintense T2-weight signal within the white matter. The midline structures are normal. Vascular: Major flow voids are preserved. Skull and upper cervical spine: Normal calvarium and skull base. Visualized upper cervical spine and soft tissues are normal. Sinuses/Orbits:Left maxillary sinus retention cyst.  Normal orbits. IMPRESSION: No acute intracranial abnormality. Electronically Signed   By: Chrisandra Netters.D.  On: 11/25/2022 23:27   CT HEAD CODE STROKE WO CONTRAST  Result Date: 11/25/2022 CLINICAL DATA:  Code stroke.  Right-sided numbness EXAM: CT  HEAD WITHOUT CONTRAST TECHNIQUE: Contiguous axial images were obtained from the base of the skull through the vertex without intravenous contrast. RADIATION DOSE REDUCTION: This exam was performed according to the departmental dose-optimization program which includes automated exposure control, adjustment of the mA and/or kV according to patient size and/or use of iterative reconstruction technique. COMPARISON:  None Available. FINDINGS: Brain: There is no mass, hemorrhage or extra-axial collection. The size and configuration of the ventricles and extra-axial CSF spaces are normal. The brain parenchyma is normal, without evidence of acute or chronic infarction. Vascular: No abnormal hyperdensity of the major intracranial arteries or dural venous sinuses. No intracranial atherosclerosis. Skull: The visualized skull base, calvarium and extracranial soft tissues are normal. Sinuses/Orbits: No fluid levels or advanced mucosal thickening of the visualized paranasal sinuses. No mastoid or middle ear effusion. The orbits are normal. ASPECTS [facility] Stroke Program Early CT Score) - Ganglionic level infarction (caudate, lentiform nuclei, internal capsule, insula, M1-M3 cortex): 7 - Supraganglionic infarction (M4-M6 cortex): 3 Total score (0-10 with 10 being normal): 10 IMPRESSION: 1. No acute intracranial abnormality. 2. ASPECTS is 10. These results were called by telephone at the time of interpretation on 11/25/2022 at 8:02 pm to provider Shaune Pollack , who verbally acknowledged these results. Electronically Signed   By: Deatra Robinson M.D.   On: 11/25/2022 20:03     Assessment and plan- Patient is a 57 y.o. male with history of stage Ib bulky large B-cell lymphoma diagnosed in 2019 s/p 6 cycles of R-CHOP followed by involved field radiation currently in CR 1.  He is here for routine surveillance visit  Clinically patient is doing well with no concerning signs and symptoms of recurrence based on today's exam.  This is  his fifth year of surveillance and by November 2024 he will be 5 years out of chemotherapy.  I will see him back in 6 months with labs and following that I will see him on a yearly basis.   Visit Diagnosis 1. Encounter for follow-up surveillance of diffuse large B-cell lymphoma      Dr. Owens Shark, MD, MPH [facility] at [facility] [phone] 12/23/2022 4:50 PM

## 2023-01-24 ENCOUNTER — Other Ambulatory Visit (HOSPITAL_BASED_OUTPATIENT_CLINIC_OR_DEPARTMENT_OTHER): Payer: Self-pay | Admitting: Cardiovascular Disease

## 2023-02-07 ENCOUNTER — Other Ambulatory Visit: Payer: Self-pay | Admitting: Cardiology

## 2023-04-20 ENCOUNTER — Encounter: Payer: Self-pay | Admitting: Oncology

## 2023-07-06 ENCOUNTER — Inpatient Hospital Stay: Payer: Commercial Managed Care - PPO

## 2023-07-06 ENCOUNTER — Inpatient Hospital Stay: Payer: Commercial Managed Care - PPO | Attending: Oncology

## 2023-07-06 ENCOUNTER — Inpatient Hospital Stay (HOSPITAL_BASED_OUTPATIENT_CLINIC_OR_DEPARTMENT_OTHER): Payer: Commercial Managed Care - PPO | Admitting: Oncology

## 2023-07-06 ENCOUNTER — Encounter: Payer: Self-pay | Admitting: Oncology

## 2023-07-06 VITALS — BP 103/80 | HR 88 | Temp 96.1°F | Resp 18 | Wt 265.5 lb

## 2023-07-06 DIAGNOSIS — C8330 Diffuse large B-cell lymphoma, unspecified site: Secondary | ICD-10-CM | POA: Insufficient documentation

## 2023-07-06 DIAGNOSIS — Z08 Encounter for follow-up examination after completed treatment for malignant neoplasm: Secondary | ICD-10-CM

## 2023-07-06 DIAGNOSIS — D751 Secondary polycythemia: Secondary | ICD-10-CM

## 2023-07-06 DIAGNOSIS — Z8579 Personal history of other malignant neoplasms of lymphoid, hematopoietic and related tissues: Secondary | ICD-10-CM

## 2023-07-06 LAB — COMPREHENSIVE METABOLIC PANEL
ALT: 29 U/L (ref 0–44)
AST: 24 U/L (ref 15–41)
Albumin: 3.8 g/dL (ref 3.5–5.0)
Alkaline Phosphatase: 58 U/L (ref 38–126)
Anion gap: 10 (ref 5–15)
BUN: 16 mg/dL (ref 6–20)
CO2: 24 mmol/L (ref 22–32)
Calcium: 9.1 mg/dL (ref 8.9–10.3)
Chloride: 104 mmol/L (ref 98–111)
Creatinine, Ser: 1.15 mg/dL (ref 0.61–1.24)
GFR, Estimated: >60 mL/min (ref >60)
Glucose, Bld: 93 mg/dL (ref 70–99)
Potassium: 4.2 mmol/L (ref 3.5–5.1)
Sodium: 138 mmol/L (ref 135–145)
Total Bilirubin: 1.3 mg/dL — ABNORMAL HIGH (ref <1.2)
Total Protein: 6.8 g/dL (ref 6.5–8.1)

## 2023-07-06 LAB — CBC WITH DIFFERENTIAL/PLATELET
Abs Immature Granulocytes: 0.05 10*3/uL (ref 0.00–0.07)
Basophils Absolute: 0.1 10*3/uL (ref 0.0–0.1)
Basophils Relative: 1 %
Eosinophils Absolute: 0.3 10*3/uL (ref 0.0–0.5)
Eosinophils Relative: 4 %
HCT: 50.6 % (ref 39.0–52.0)
Hemoglobin: 17.2 g/dL — ABNORMAL HIGH (ref 13.0–17.0)
Immature Granulocytes: 1 %
Lymphocytes Relative: 13 %
Lymphs Abs: 1 10*3/uL (ref 0.7–4.0)
MCH: 30.4 pg (ref 26.0–34.0)
MCHC: 34 g/dL (ref 30.0–36.0)
MCV: 89.4 fL (ref 80.0–100.0)
Monocytes Absolute: 0.5 10*3/uL (ref 0.1–1.0)
Monocytes Relative: 6 %
Neutro Abs: 6 10*3/uL (ref 1.7–7.7)
Neutrophils Relative %: 75 %
Platelets: 206 10*3/uL (ref 150–400)
RBC: 5.66 MIL/uL (ref 4.22–5.81)
RDW: 14.6 % (ref 11.5–15.5)
WBC: 7.9 10*3/uL (ref 4.0–10.5)
nRBC: 0 % (ref 0.0–0.2)

## 2023-07-06 LAB — LACTATE DEHYDROGENASE: LDH: 148 U/L (ref 98–192)

## 2023-07-06 NOTE — Progress Notes (Signed)
Hematology/Oncology Consult note [facility]  [phone] [phone]  Patient Care Team: Danella Penton, MD as PCP - General (Internal Medicine) Iran Ouch, MD as PCP - Cardiology (Cardiology) Creig Hines, MD as Consulting Physician (Hematology and Oncology)   Name of the patient: Ronnie Mcintyre  [id number]  [birth year]   Date of visit: 07/06/23  Diagnosis-history of diffuse large B-cell lymphoma in 2019 currently in CR 1  Chief complaint/ Reason for visit-routine follow-up of B-cell lymphoma  Heme/Onc history:  Ronnie Mcintyre is a 57 y.o. 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  [facility] pathology revealed necrotic tissue.  [facility] consultation revealed neoplastic cells are positive for CD20, BCL-6, MUM-1, and BCL-2. The neoplastic cells were negative for CD30, CD3, , CD10, c-MYC, EBV ISH, AE1/3, and PAX-8. Ki67 is 60-70%. 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 The absence of CD30 expression makes primary mediastinal large B-cell lymphoma less likely. FISH for MYC rearrangement was negative. IPI score, age adjusted IPI, are low (1) and NCCN IPI score is low-intermediate (2).   He presented with upper chest fullness with radiation to his neck and arm.  He has had B symptoms (sweats and weight loss) worrisome for lymphoma.  LDH is 273.  Uric acid was normal.  Beta-HCG and AFP were normal on 01/16/2018.  On   Bone marrow biopsy on 02/03/2018 revealed a slightly hypercellular bone marrow for age with trilineage hematopoiesis.  There was no monoclonal B-cell population or abnormal T-cell phenotype identified.  Cytogenetics were normal (46, XY).   He received 6 cycles of RCHOP chemotherapy (02/16/2018 - 06/10/2018) with Neulasta support.  He received radiation (3600 cGy in 18 fractions) from 07/27/2018 - 08/19/2018.   Patient has had  surveillance scans up until November 2021 Which showed small residual MR for soft tissue density in the mediastinum measuring 2.4 cm x 1.2 cm with no significant change over the last 1 year.  Interval history-he is doing well overall.  He has gained about 20 pounds in the last 6 to 7 months.  Reports getting a good night sleep and he wakes up in the morning feeling refreshed.  He reports ongoing neck pain with occasional radiating pain to his bilateral upper extremities for which she is undergoing workup and may be getting MRI soon.  ECOG PS- 1 Pain scale- 4  Review of systems- Review of Systems  Musculoskeletal:  Positive for neck pain.      No Known Allergies   Past Medical History:  Diagnosis Date   Cancer (HCC) 12/2017   Non-Hogkins lumphoma   Hypertension    Non Hodgkin's lymphoma (HCC)    TIA (transient ischemic attack)      Past Surgical History:  Procedure Laterality Date   ANKLE SURGERY Right    LEFT HEART CATH AND CORONARY ANGIOGRAPHY N/A 05/05/2022   Procedure: LEFT HEART CATH AND CORONARY ANGIOGRAPHY;  Surgeon: Iran Ouch, MD;  Location: [facility] INVASIVE CV LAB;  Service: Cardiovascular;  Laterality: N/A;   PORTACATH PLACEMENT Right 02/11/2018   Procedure: INSERTION PORT-A-CATH;  Surgeon: Leafy Ro, MD;  Location: [facility] ORS;  Service: General;  Laterality: Right;    Social History   Socioeconomic History   Marital status: Single    Spouse name: Not on file   Number of children: 1   Years of education: Not on file  Highest education level: Not on file  Occupational History   Not on file  Tobacco Use   Smoking status: Never   Smokeless tobacco: Never  Vaping Use   Vaping status: Never Used  Substance and Sexual Activity   Alcohol use: Yes    Comment: rarely   Drug use: No   Sexual activity: Not Currently  Other Topics Concern   Not on file  Social History Narrative   Right handed   Caffeine oz per day    Son lives in [place]the country[place]  55 y.o.   Social Drivers of Corporate investment banker Strain: Low Risk  (06/29/2023)   Received from [facility]   Overall Financial Resource Strain (CARDIA)    Difficulty of Paying Living Expenses: Not hard at all  Food Insecurity: No Food Insecurity (06/29/2023)   Received from [facility]   Hunger Vital Sign    Worried About Running Out of Food in the Last Year: Never true    Ran Out of Food in the Last Year: Never true  Transportation Needs: No Transportation Needs (06/29/2023)   Received from [facility] - Transportation    In the past 12 months, has lack of transportation kept you from medical appointments or from getting medications?: No    Lack of Transportation (Non-Medical): No  Physical Activity: Not on file  Stress: Not on file  Social Connections: Unknown (03/25/2019)   Received from [facility], [facility]   Social Connection and Isolation Panel [NHANES]    Frequency of Communication with Friends and Family: Not on file    Frequency of Social Gatherings with Friends and Family: Not on file    Attends Religious Services: Not on file    Active Member of Clubs or Organizations: Not on file    Attends Banker Meetings: Not on file    Marital Status: Divorced  Intimate Partner Violence: Patient Declined (11/26/2022)   Humiliation, Afraid, Rape, and Kick questionnaire    Fear of Current or Ex-Partner: Patient declined    Emotionally Abused: Patient declined    Physically Abused: Patient declined    Sexually Abused: Patient declined    Family History  Problem Relation Age of Onset   Hypertension Mother    Hypertension Father    Multiple myeloma Father    Transient ischemic attack Father      Current Outpatient Medications:    acetaminophen (TYLENOL) 325 MG tablet, Take 2 tablets (650 mg total) by mouth every 6 (six) hours as needed for mild pain (or Fever  >/= 101)., Disp: , Rfl:    aspirin EC 81 MG tablet, Take 1 tablet (81 mg total) by mouth daily. Swallow whole., Disp: , Rfl:    clopidogrel (PLAVIX) 75 MG tablet, Take 1 tablet (75 mg total) by mouth daily., Disp: 21 tablet, Rfl: 0   metoprolol succinate (TOPROL-XL) 50 MG 24 hr tablet, Take 50 mg by mouth 2 (two) times daily., Disp: , Rfl:    rosuvastatin (CRESTOR) 40 MG tablet, Take 1 tablet (40 mg total) by mouth daily., Disp: 90 tablet, Rfl: 2   sacubitril-valsartan (ENTRESTO) 24-26 MG, Take 1 tablet by mouth 2 (two) times daily., Disp: , Rfl:    spironolactone (ALDACTONE) 25 MG tablet, TAKE 1/2 TABLET BY MOUTH DAILY (Patient taking differently: Take 25 mg by mouth daily.), Disp: 45 tablet, Rfl: 0  Physical exam:  Vitals:   07/06/23  1036  BP: 103/80  Pulse: 88  Resp: 18  Temp: (!) 96.1 F (35.6 C)  TempSrc: Tympanic  SpO2: 99%  Weight: 265 lb 8 oz (120.4 kg)   Physical Exam Cardiovascular:     Rate and Rhythm: Normal rate and regular rhythm.     Heart sounds: Normal heart sounds.  Pulmonary:     Effort: Pulmonary effort is normal.     Breath sounds: Normal breath sounds.  Abdominal:     General: Bowel sounds are normal.     Palpations: Abdomen is soft.     Comments: No palpable hepatosplenomegaly  Lymphadenopathy:     Comments: No palpable cervical, supraclavicular, axillary or inguinal adenopathy    Skin:    General: Skin is warm and dry.  Neurological:     Mental Status: He is alert and oriented to person, place, and time.         Latest Ref Rng & Units 07/06/2023   10:09 AM  CMP  Glucose 70 - 99 mg/dL 93   BUN 6 - 20 mg/dL 16   Creatinine 1.61 - 1.24 mg/dL 0.96   Sodium 045 - 409 mmol/L 138   Potassium 3.5 - 5.1 mmol/L 4.2   Chloride 98 - 111 mmol/L 104   CO2 22 - 32 mmol/L 24   Calcium 8.9 - 10.3 mg/dL 9.1   Total Protein 6.5 - 8.1 g/dL 6.8   Total Bilirubin <8.1 mg/dL 1.3   Alkaline Phos 38 - 126 U/L 58   AST 15 - 41 U/L 24   ALT 0 - 44 U/L 29        Latest Ref Rng & Units 07/06/2023   10:09 AM  CBC  WBC 4.0 - 10.5 K/uL 7.9   Hemoglobin 13.0 - 17.0 g/dL 19.1   Hematocrit 47.8 - 52.0 % 50.6   Platelets 150 - 400 K/uL 206      Assessment and plan- Patient is a 57 y.o. male  with history of stage Ib bulky large B-cell lymphoma diagnosed in 2019 s/p 6 cycles of R-CHOP followed by involved field radiation currently in CR 1.  He is here for a routine surveillance visit for diffuse large B-cell lymphoma  Clinically patient is doing well with no concerning signs and symptoms of recurrence based on today's exam.  He is now 5 years out of completing chemotherapy for his lymphoma and remains in remission.  I will see him on a yearly basis for the same.  Polycythemia: Patient noted to have mild polycythemia with a hemoglobin that has beenAnd round 17 for the last 2 years at least.  Suspect this could be secondary to obstructive sleep apnea given his high BMI.  I would like him to get an overnight sleep study through his primary care doctor.  I am checking JAK2, EPO, exon 12 level study to rule out polycythemia vera.  He plans to donate his blood once a month as well   Visit Diagnosis 1. Encounter for follow-up surveillance of diffuse large B-cell lymphoma   2. Polycythemia      Dr. Owens Shark, MD, MPH [facility] at [facility] [phone] 07/06/2023 12:33 PM

## 2023-07-07 LAB — ERYTHROPOIETIN: Erythropoietin: 25.7 m[IU]/mL — ABNORMAL HIGH (ref 2.6–18.5)

## 2023-07-08 ENCOUNTER — Other Ambulatory Visit: Payer: Self-pay | Admitting: Internal Medicine

## 2023-07-08 DIAGNOSIS — M5010 Cervical disc disorder with radiculopathy, unspecified cervical region: Secondary | ICD-10-CM

## 2023-07-08 DIAGNOSIS — M4692 Unspecified inflammatory spondylopathy, cervical region: Secondary | ICD-10-CM

## 2023-07-11 ENCOUNTER — Ambulatory Visit
Admission: RE | Admit: 2023-07-11 | Discharge: 2023-07-11 | Disposition: A | Discharge status: Home / Self Care | Payer: Commercial Managed Care - PPO | Source: Ambulatory Visit | Attending: Internal Medicine | Admitting: Internal Medicine

## 2023-07-11 DIAGNOSIS — M5010 Cervical disc disorder with radiculopathy, unspecified cervical region: Secondary | ICD-10-CM | POA: Diagnosis present

## 2023-07-11 DIAGNOSIS — M4692 Unspecified inflammatory spondylopathy, cervical region: Secondary | ICD-10-CM | POA: Insufficient documentation

## 2023-07-11 DIAGNOSIS — M5412 Radiculopathy, cervical region: Secondary | ICD-10-CM | POA: Insufficient documentation

## 2023-07-12 LAB — JAK2 V617F RFX CALR/MPL/E12-15

## 2023-07-12 LAB — CALR +MPL + E12-E15  (REFLEX)

## 2023-08-21 ENCOUNTER — Other Ambulatory Visit: Payer: Self-pay | Admitting: Family Medicine

## 2023-08-21 DIAGNOSIS — M5416 Radiculopathy, lumbar region: Secondary | ICD-10-CM

## 2023-09-09 ENCOUNTER — Ambulatory Visit
Admission: RE | Admit: 2023-09-09 | Discharge: 2023-09-09 | Disposition: A | Discharge status: Home / Self Care | Payer: Commercial Managed Care - PPO | Source: Ambulatory Visit | Attending: Family Medicine | Admitting: Family Medicine

## 2023-09-09 DIAGNOSIS — M5416 Radiculopathy, lumbar region: Secondary | ICD-10-CM

## 2023-09-09 MED ORDER — IOPAMIDOL (ISOVUE-M 300) INJECTION 61%
1.0000 mL | Freq: Once | INTRAMUSCULAR | Status: AC | PRN
  Administered 2023-09-09: 1 mL via EPIDURAL

## 2023-09-09 MED ORDER — TRIAMCINOLONE ACETONIDE 40 MG/ML IJ SUSP (RADIOLOGY)
60.0000 mg | Freq: Once | INTRAMUSCULAR | Status: AC
  Administered 2023-09-09: 60 mg via EPIDURAL

## 2023-09-09 NOTE — Discharge Instructions (Signed)
Post Procedure Spinal Discharge Instruction Sheet  You may resume a regular diet and any medications that you routinely take (including pain medications) unless otherwise noted by MD.  No driving day of procedure.  Light activity throughout the rest of the day.  Do not do any strenuous work, exercise, bending or lifting.  The day following the procedure, you can resume normal physical activity but you should refrain from exercising or physical therapy for at least three days thereafter.  You may apply ice to the injection site, 20 minutes on, 20 minutes off, as needed. Do not apply ice directly to skin.    Common Side Effects:  Headaches- take your usual medications as directed by your physician.  Increase your fluid intake.  Caffeinated beverages may be helpful.  Lie flat in bed until your headache resolves.  Restlessness or inability to sleep- you may have trouble sleeping for the next few days.  Ask your referring physician if you need any medication for sleep.  Facial flushing or redness- should subside within a few days.  Increased pain- a temporary increase in pain a day or two following your procedure is not unusual.  Take your pain medication as prescribed by your referring physician.  Leg cramps  Please contact our office at [phone] for the following symptoms: Fever greater than 100 degrees. Headaches unresolved with medication after 2-3 days. Increased swelling, pain, or redness at injection site.   Thank you for visiting [facility] today.    You may resume your Aspirin today.

## 2023-09-11 NOTE — Telephone Encounter (Signed)
MAILED RECALL LETTER

## 2024-01-04 ENCOUNTER — Other Ambulatory Visit: Payer: Self-pay | Admitting: Gastroenterology

## 2024-03-04 ENCOUNTER — Encounter: Payer: Self-pay | Admitting: *Deleted

## 2024-03-14 ENCOUNTER — Encounter
Admission: RE | Disposition: A | Discharge status: Home / Self Care | Payer: Self-pay | Source: Home / Self Care | Attending: Gastroenterology

## 2024-03-14 ENCOUNTER — Ambulatory Visit
Admission: RE | Admit: 2024-03-14 | Discharge: 2024-03-14 | Disposition: A | Discharge status: Home / Self Care | Attending: Gastroenterology | Admitting: Gastroenterology

## 2024-03-14 ENCOUNTER — Other Ambulatory Visit: Payer: Self-pay

## 2024-03-14 ENCOUNTER — Ambulatory Visit: Admitting: Anesthesiology

## 2024-03-14 DIAGNOSIS — D120 Benign neoplasm of cecum: Secondary | ICD-10-CM | POA: Insufficient documentation

## 2024-03-14 DIAGNOSIS — K640 First degree hemorrhoids: Secondary | ICD-10-CM | POA: Insufficient documentation

## 2024-03-14 DIAGNOSIS — E66813 Obesity, class 3: Secondary | ICD-10-CM | POA: Insufficient documentation

## 2024-03-14 DIAGNOSIS — C859A Non-Hodgkin lymphoma, unspecified, in remission: Secondary | ICD-10-CM | POA: Diagnosis not present

## 2024-03-14 DIAGNOSIS — Z1211 Encounter for screening for malignant neoplasm of colon: Secondary | ICD-10-CM | POA: Diagnosis not present

## 2024-03-14 DIAGNOSIS — I110 Hypertensive heart disease with heart failure: Secondary | ICD-10-CM | POA: Diagnosis not present

## 2024-03-14 DIAGNOSIS — Z9221 Personal history of antineoplastic chemotherapy: Secondary | ICD-10-CM | POA: Insufficient documentation

## 2024-03-14 DIAGNOSIS — I2510 Atherosclerotic heart disease of native coronary artery without angina pectoris: Secondary | ICD-10-CM | POA: Diagnosis not present

## 2024-03-14 DIAGNOSIS — I5022 Chronic systolic (congestive) heart failure: Secondary | ICD-10-CM | POA: Insufficient documentation

## 2024-03-14 DIAGNOSIS — D122 Benign neoplasm of ascending colon: Secondary | ICD-10-CM | POA: Insufficient documentation

## 2024-03-14 DIAGNOSIS — Z6833 Body mass index (BMI) 33.0-33.9, adult: Secondary | ICD-10-CM | POA: Insufficient documentation

## 2024-03-14 DIAGNOSIS — Z860101 Personal history of adenomatous and serrated colon polyps: Secondary | ICD-10-CM | POA: Diagnosis present

## 2024-03-14 DIAGNOSIS — Z951 Presence of aortocoronary bypass graft: Secondary | ICD-10-CM | POA: Diagnosis not present

## 2024-03-14 DIAGNOSIS — K5730 Diverticulosis of large intestine without perforation or abscess without bleeding: Secondary | ICD-10-CM | POA: Diagnosis not present

## 2024-03-14 HISTORY — DX: Drug-induced polyneuropathy: T45.1X5A

## 2024-03-14 HISTORY — DX: Benign neoplasm of colon, unspecified: D12.6

## 2024-03-14 HISTORY — DX: Unspecified B-cell lymphoma, unspecified site: C85.10

## 2024-03-14 HISTORY — PX: POLYPECTOMY: SHX149

## 2024-03-14 HISTORY — DX: Cervical disc disorder, unspecified, unspecified cervical region: M50.90

## 2024-03-14 HISTORY — DX: Precordial pain: R07.2

## 2024-03-14 HISTORY — DX: Atherosclerotic heart disease of native coronary artery without angina pectoris: I25.10

## 2024-03-14 HISTORY — PX: COLONOSCOPY: SHX5424

## 2024-03-14 HISTORY — DX: Deficiency of other specified B group vitamins: E53.8

## 2024-03-14 HISTORY — DX: Ischemic cardiomyopathy: I25.5

## 2024-03-14 SURGERY — COLONOSCOPY
Anesthesia: General

## 2024-03-14 MED ORDER — LIDOCAINE HCL (CARDIAC) PF 100 MG/5ML IV SOSY
PREFILLED_SYRINGE | INTRAVENOUS | Status: DC | PRN
  Administered 2024-03-14: 100 mg via INTRAVENOUS

## 2024-03-14 MED ORDER — PHENYLEPHRINE 80 MCG/ML (10ML) SYRINGE FOR IV PUSH (FOR BLOOD PRESSURE SUPPORT)
PREFILLED_SYRINGE | INTRAVENOUS | Status: DC | PRN
  Administered 2024-03-14 (×3): 50 ug via INTRAVENOUS

## 2024-03-14 MED ORDER — PROPOFOL 500 MG/50ML IV EMUL
INTRAVENOUS | Status: DC | PRN
  Administered 2024-03-14: 150 ug/kg/min via INTRAVENOUS

## 2024-03-14 MED ORDER — PHENYLEPHRINE 80 MCG/ML (10ML) SYRINGE FOR IV PUSH (FOR BLOOD PRESSURE SUPPORT)
PREFILLED_SYRINGE | INTRAVENOUS | Status: AC
  Filled 2024-03-14: qty 10

## 2024-03-14 MED ORDER — PROPOFOL 1000 MG/100ML IV EMUL
INTRAVENOUS | Status: AC
  Filled 2024-03-14: qty 100

## 2024-03-14 MED ORDER — LIDOCAINE HCL (PF) 2 % IJ SOLN
INTRAMUSCULAR | Status: AC
  Filled 2024-03-14: qty 5

## 2024-03-14 MED ORDER — PROPOFOL 10 MG/ML IV BOLUS
INTRAVENOUS | Status: DC | PRN
  Administered 2024-03-14: 100 mg via INTRAVENOUS

## 2024-03-14 MED ORDER — SODIUM CHLORIDE 0.9 % IV SOLN: INTRAVENOUS | Status: DC

## 2024-03-14 NOTE — Anesthesia Preprocedure Evaluation (Signed)
 Anesthesia Evaluation  Patient identified by MRN, date of birth, ID band Patient awake    Reviewed: Allergy & Precautions, NPO status , Patient's Chart, lab work & pertinent test results  Airway Mallampati: II  TM Distance: >3 FB Neck ROM: full    Dental  (+) Teeth Intact   Pulmonary neg pulmonary ROS   Pulmonary exam normal breath sounds clear to auscultation       Cardiovascular Exercise Tolerance: Good hypertension, Pt. on medications + CAD and + CABG  negative cardio ROS Normal cardiovascular exam Rhythm:Regular Rate:Normal     Neuro/Psych negative neurological ROS  negative psych ROS   GI/Hepatic negative GI ROS, Neg liver ROS,,,  Endo/Other  negative endocrine ROS  Class 3 obesity  Renal/GU negative Renal ROS  negative genitourinary   Musculoskeletal negative musculoskeletal ROS (+)    Abdominal  (+) + obese  Peds negative pediatric ROS (+)  Hematology negative hematology ROS (+)   Anesthesia Other Findings Past Medical History: No date: B12 deficiency 12/2017: Cancer (HCC)     Comment:  Non-Hogkins lumphoma No date: Cervical disc disease No date: Coronary artery disease No date: Hypertension No date: Ischemic cardiomyopathy No date: Large B-cell lymphoma (HCC) No date: Neuropathy due to chemotherapeutic drug (HCC) No date: Non Hodgkin's lymphoma (HCC) No date: Precordial chest pain No date: TIA (transient ischemic attack) No date: Tubular adenoma of colon  Past Surgical History: No date: ANKLE SURGERY; Right No date: CORONARY ARTERY BYPASS GRAFT 05/05/2022: LEFT HEART CATH AND CORONARY ANGIOGRAPHY; N/A     Comment:  Procedure: LEFT HEART CATH AND CORONARY ANGIOGRAPHY;                Surgeon: Darron Deatrice LABOR, MD;  Location: [facility] INVASIVE               CV LAB;  Service: Cardiovascular;  Laterality: N/A; 02/11/2018: PORTACATH PLACEMENT; Right     Comment:  Procedure: INSERTION PORT-A-CATH;   Surgeon: Jordis Laneta FALCON, MD;  Location: [facility] ORS;  Service: General;                Laterality: Right; No date: Status post ligation of left atrial appendage; N/A  BMI    Body Mass Index: 33.46 kg/m      Reproductive/Obstetrics negative OB ROS                              Anesthesia Physical Anesthesia Plan  ASA: 3  Anesthesia Plan: General   Post-op Pain Management:    Induction: Intravenous  PONV Risk Score and Plan: Propofol  infusion and TIVA  Airway Management Planned: Natural Airway and Nasal Cannula  Additional Equipment:   Intra-op Plan:   Post-operative Plan:   Informed Consent: I have reviewed the patients History and Physical, chart, labs and discussed the procedure including the risks, benefits and alternatives for the proposed anesthesia with the patient or authorized representative who has indicated his/her understanding and acceptance.     Dental Advisory Given  Plan Discussed with: CRNA  Anesthesia Plan Comments:         Anesthesia Quick Evaluation

## 2024-03-14 NOTE — Transfer of Care (Signed)
 Immediate Anesthesia Transfer of Care Note  Patient: Ronnie Mcintyre  Procedure(s) Performed: COLONOSCOPY POLYPECTOMY, INTESTINE  Patient Location: PACU  Anesthesia Type:General  Level of Consciousness: awake and sedated  Airway & Oxygen Therapy: Patient Spontanous Breathing and Patient connected to nasal cannula oxygen  Post-op Assessment: Report given to RN and Post -op Vital signs reviewed and stable  Post vital signs: Reviewed and stable  Last Vitals:  Vitals Value Taken Time  BP    Temp    Pulse    Resp    SpO2      Last Pain:  Vitals:   03/14/24 1028  TempSrc: Temporal  PainSc: 0-No pain         Complications: There were no known notable events for this encounter.

## 2024-03-14 NOTE — Op Note (Signed)
 [facility] Patient Name: Ronnie Mcintyre Procedure Date: 03/14/2024 11:10 AM MRN: [id number] Account #: [id number] Date of Birth: [birth year] Admit Type: Outpatient Age: 58 Room: [facility] ENDO ROOM 3 Gender: Male Note Status: Finalized Instrument Name: Colon Scope [id number] [id number] Procedure:             Colonoscopy Indications:           Surveillance: Personal history of adenomatous polyps                         on last colonoscopy > 5 years ago Providers:             Ole Schick MD, MD Referring MD:          Oneil PHEBE Pinal, MD (Referring MD) Medicines:             Monitored Anesthesia Care Complications:         No immediate complications. Estimated blood loss:                         Minimal. Procedure:             Pre-Anesthesia Assessment:                        - Prior to the procedure, a History and Physical was                         performed, and patient medications and allergies were                         reviewed. The patient is competent. The risks and                         benefits of the procedure and the sedation options and                         risks were discussed with the patient. All questions                         were answered and informed consent was obtained.                         Patient identification and proposed procedure were                         verified by the physician, the nurse, the                         anesthesiologist, the anesthetist and the technician                         in the endoscopy suite. Mental Status Examination:                         alert and oriented. Airway Examination: normal                         oropharyngeal airway and neck mobility. Respiratory  Examination: clear to auscultation. CV Examination:                         normal. Prophylactic Antibiotics: The patient does not                         require prophylactic antibiotics. Prior                          Anticoagulants: The patient has taken no anticoagulant                         or antiplatelet agents. ASA Grade Assessment: III - A                         patient with severe systemic disease. After reviewing                         the risks and benefits, the patient was deemed in                         satisfactory condition to undergo the procedure. The                         anesthesia plan was to use monitored anesthesia care                         (MAC). Immediately prior to administration of                         medications, the patient was re-assessed for adequacy                         to receive sedatives. The heart rate, respiratory                         rate, oxygen saturations, blood pressure, adequacy of                         pulmonary ventilation, and response to care were                         monitored throughout the procedure. The physical                         status of the patient was re-assessed after the                         procedure.                        After obtaining informed consent, the colonoscope was                         passed under direct vision. Throughout the procedure,                         the patient's blood pressure, pulse, and oxygen  saturations were monitored continuously. The                         Colonoscope was introduced through the anus and                         advanced to the the cecum, identified by appendiceal                         orifice and ileocecal valve. The colonoscopy was                         performed without difficulty. The patient tolerated                         the procedure well. The quality of the bowel                         preparation was good. The ileocecal valve, appendiceal                         orifice, and rectum were photographed. Findings:      The perianal and digital rectal examinations were normal.      A 2 mm polyp was found in the ileocecal  valve. The polyp was sessile.       The polyp was removed with a jumbo cold forceps. Resection and retrieval       were complete. Estimated blood loss was minimal.      A 2 mm polyp was found in the ascending colon. The polyp was sessile.       The polyp was removed with a jumbo cold forceps. Resection and retrieval       were complete. Estimated blood loss was minimal.      A 3 mm polyp was found in the ascending colon. The polyp was sessile.       The polyp was removed with a cold snare. Resection and retrieval were       complete. Estimated blood loss was minimal.      Multiple small-mouthed diverticula were found in the sigmoid colon.      Internal hemorrhoids were found during retroflexion. The hemorrhoids       were Grade I (internal hemorrhoids that do not prolapse).      The exam was otherwise without abnormality on direct and retroflexion       views. Impression:            - One 2 mm polyp at the ileocecal valve, removed with                         a jumbo cold forceps. Resected and retrieved.                        - One 2 mm polyp in the ascending colon, removed with                         a jumbo cold forceps. Resected and retrieved.                        - One 3 mm polyp in the ascending colon,  removed with                         a cold snare. Resected and retrieved.                        - Diverticulosis in the sigmoid colon.                        - Internal hemorrhoids.                        - The examination was otherwise normal on direct and                         retroflexion views. Recommendation:        - Discharge patient to home.                        - Resume previous diet.                        - Continue present medications.                        - Await pathology results.                        - Repeat colonoscopy in 5 years for surveillance.                        - Return to referring physician as previously                          scheduled. Procedure Code(s):     --- Professional ---                        [phone], Colonoscopy, flexible; with removal of                         tumor(s), polyp(s), or other lesion(s) by snare                         technique                        45380, 59, Colonoscopy, flexible; with biopsy, single                         or multiple Diagnosis Code(s):     --- Professional ---                        Z86.010, Personal history of colonic polyps                        D12.0, Benign neoplasm of cecum                        D12.2, Benign neoplasm of ascending colon                        K64.0, First degree hemorrhoids  K57.30, Diverticulosis of large intestine without                         perforation or abscess without bleeding CPT copyright 2022 American Medical Association. All rights reserved. The codes documented in this report are preliminary and upon coder review may  be revised to meet current compliance requirements. Ole Schick MD, MD 03/14/2024 11:50:21 AM Number of Addenda: 0 Note Initiated On: 03/14/2024 11:10 AM Scope Withdrawal Time: 0 hours 10 minutes 5 seconds  Total Procedure Duration: 0 hours 14 minutes 3 seconds  Estimated Blood Loss:  Estimated blood loss was minimal.      [facility]

## 2024-03-14 NOTE — Anesthesia Postprocedure Evaluation (Signed)
 Anesthesia Post Note  Patient: Ronnie Mcintyre  Procedure(s) Performed: COLONOSCOPY POLYPECTOMY, INTESTINE  Patient location during evaluation: PACU Anesthesia Type: General Level of consciousness: awake Pain management: satisfactory to patient Vital Signs Assessment: post-procedure vital signs reviewed and stable Respiratory status: nonlabored ventilation and spontaneous breathing Cardiovascular status: stable Anesthetic complications: no   There were no known notable events for this encounter.   Last Vitals:  Vitals:   03/14/24 1209 03/14/24 1210  BP: 95/67   Pulse: 85 86  Resp: 18 (!) 23  Temp:    SpO2: 97% 98%    Last Pain:  Vitals:   03/14/24 1152  TempSrc: Temporal  PainSc:                  [name],[name]

## 2024-03-14 NOTE — H&P (Signed)
 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 Last colonoscopy in 2019 with small Ta's. No significant abdominal surgeries. No family history of GI malignancies.    Current Facility-Administered Medications:    0.9 %  sodium chloride  infusion, , Intravenous, Continuous, Terria Deschepper, Ole ONEIDA, MD, Last Rate: 20 mL/hr at 03/14/24 1103, Continued from Pre-op at 03/14/24 1103  Medications Prior to Admission  Medication Sig Dispense Refill Last Dose/Taking   aspirin  EC 81 MG tablet Take 1 tablet (81 mg total) by mouth daily. Swallow whole.   03/13/2024   empagliflozin  (JARDIANCE ) 10 MG TABS tablet Take 10 mg by mouth daily.   Taking   metoprolol  succinate (TOPROL -XL) 50 MG 24 hr tablet Take 50 mg by mouth 2 (two) times daily.   03/13/2024   rosuvastatin  (CRESTOR ) 40 MG tablet Take 1 tablet (40 mg total) by mouth daily. 90 tablet 2 03/13/2024   sacubitril -valsartan  (ENTRESTO ) 24-26 MG Take 1 tablet by mouth 2 (two) times daily.   03/14/2024 Morning   spironolactone  (ALDACTONE ) 25 MG tablet TAKE 1/2 TABLET BY MOUTH DAILY (Patient taking differently: Take 25 mg by mouth daily.) 45 tablet 0 03/13/2024   acetaminophen  (TYLENOL ) 325 MG tablet Take 2 tablets (650 mg total) by mouth every 6 (six) hours as needed for mild pain (or Fever >/= 101).        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    Physical Exam  Gen: Alert, oriented. Appears stated age.  HEENT: PERRLA. Lungs: No respiratory distress CV: RRR Abd: soft, benign, no masses Ext: No edema    Planned procedures: Proceed with colonoscopy. The patient understands the nature of the planned procedure, indications, risks, alternatives and potential complications including but not limited to bleeding, infection, perforation, damage to internal organs and possible oversedation/side effects from anesthesia. The patient agrees and gives consent to proceed.  Please refer to procedure notes for findings, recommendations and patient disposition/instructions.     Ole ONEIDA Schick, MD [facility]

## 2024-03-14 NOTE — Interval H&P Note (Signed)
 History and Physical Interval Note:  03/14/2024 11:18 AM  Ronnie Mcintyre  has presented today for surgery, with the diagnosis of hx of adenomatous polyp of colon.  The various methods of treatment have been discussed with the patient and family. After consideration of risks, benefits and other options for treatment, the patient has consented to  Procedure(s): COLONOSCOPY (N/A) as a surgical intervention.  The patient's history has been reviewed, patient examined, no change in status, stable for surgery.  I have reviewed the patient's chart and labs.  Questions were answered to the patient's satisfaction.     Ronnie Mcintyre  Ok to proceed with colonoscopy

## 2024-03-15 LAB — SURGICAL PATHOLOGY

## 2024-07-04 ENCOUNTER — Other Ambulatory Visit: Payer: Self-pay

## 2024-07-04 DIAGNOSIS — Z08 Encounter for follow-up examination after completed treatment for malignant neoplasm: Secondary | ICD-10-CM

## 2024-07-05 ENCOUNTER — Inpatient Hospital Stay: Payer: Commercial Managed Care - PPO | Attending: Oncology

## 2024-07-05 ENCOUNTER — Inpatient Hospital Stay: Payer: Commercial Managed Care - PPO | Admitting: Oncology

## 2024-07-05 ENCOUNTER — Inpatient Hospital Stay

## 2024-07-05 ENCOUNTER — Encounter: Payer: Self-pay | Admitting: Oncology

## 2024-07-05 VITALS — BP 107/78 | HR 74 | Temp 96.0°F | Ht 72.0 in | Wt 260.0 lb

## 2024-07-05 VITALS — BP 100/70 | HR 81 | Resp 18

## 2024-07-05 DIAGNOSIS — C833A Diffuse large b-cell lymphoma, in remission: Secondary | ICD-10-CM | POA: Diagnosis not present

## 2024-07-05 DIAGNOSIS — D751 Secondary polycythemia: Secondary | ICD-10-CM | POA: Insufficient documentation

## 2024-07-05 DIAGNOSIS — Z08 Encounter for follow-up examination after completed treatment for malignant neoplasm: Secondary | ICD-10-CM

## 2024-07-05 DIAGNOSIS — E876 Hypokalemia: Secondary | ICD-10-CM

## 2024-07-05 LAB — CMP (CANCER CENTER ONLY)
ALT: 28 U/L (ref 0–44)
AST: 28 U/L (ref 15–41)
Albumin: 4.1 g/dL (ref 3.5–5.0)
Alkaline Phosphatase: 69 U/L (ref 38–126)
Anion gap: 11 (ref 5–15)
BUN: 16 mg/dL (ref 6–20)
CO2: 24 mmol/L (ref 22–32)
Calcium: 9.6 mg/dL (ref 8.9–10.3)
Chloride: 103 mmol/L (ref 98–111)
Creatinine: 1.11 mg/dL (ref 0.61–1.24)
GFR, Estimated: >60 mL/min (ref >60)
Glucose, Bld: 95 mg/dL (ref 70–99)
Potassium: 4.4 mmol/L (ref 3.5–5.1)
Sodium: 138 mmol/L (ref 135–145)
Total Bilirubin: 1.3 mg/dL — ABNORMAL HIGH (ref 0.0–1.2)
Total Protein: 6.9 g/dL (ref 6.5–8.1)

## 2024-07-05 LAB — CBC WITH DIFFERENTIAL (CANCER CENTER ONLY)
Abs Immature Granulocytes: 0.04 K/uL (ref 0.00–0.07)
Basophils Absolute: 0.1 K/uL (ref 0.0–0.1)
Basophils Relative: 1 %
Eosinophils Absolute: 0.1 K/uL (ref 0.0–0.5)
Eosinophils Relative: 1 %
HCT: 54.2 % — ABNORMAL HIGH (ref 39.0–52.0)
Hemoglobin: 18.1 g/dL — ABNORMAL HIGH (ref 13.0–17.0)
Immature Granulocytes: 0 %
Lymphocytes Relative: 13 %
Lymphs Abs: 1.3 K/uL (ref 0.7–4.0)
MCH: 29.6 pg (ref 26.0–34.0)
MCHC: 33.4 g/dL (ref 30.0–36.0)
MCV: 88.7 fL (ref 80.0–100.0)
Monocytes Absolute: 0.8 K/uL (ref 0.1–1.0)
Monocytes Relative: 8 %
Neutro Abs: 7.6 K/uL (ref 1.7–7.7)
Neutrophils Relative %: 77 %
Platelet Count: 201 K/uL (ref 150–400)
RBC: 6.11 MIL/uL — ABNORMAL HIGH (ref 4.22–5.81)
RDW: 15.4 % (ref 11.5–15.5)
WBC Count: 9.9 K/uL (ref 4.0–10.5)
nRBC: 0 % (ref 0.0–0.2)

## 2024-07-05 NOTE — Progress Notes (Signed)
 Maxximus DELENA Louder presents today for phlebotomy per MD orders. Phlebotomy procedure started at 1220 and ended at 1233. removed. Patient tolerated procedure well. IV needle removed intact.

## 2024-07-05 NOTE — Progress Notes (Signed)
 Hematology/Oncology Consult note [facility]  [phone] [phone]  Patient Care Team: Cleotilde Oneil FALCON, MD as PCP - General (Internal Medicine) Darron Deatrice LABOR, MD as PCP - Cardiology (Cardiology) Melanee Annah BROCKS, MD as Consulting Physician (Hematology and Oncology)   Name of the patient: Teryn Gust  [id number]  22-Dec-1965   Date of visit: 07/05/2024  Diagnosis- 1. history of diffuse large B-cell lymphoma in 2019 currently in CR 1  2.  Secondary polycythemia  Chief complaint/ Reason for visit-routine follow-up of polycythemia and surveillance visit for B-cell lymphoma  Heme/Onc history: EATHON VALADE is a 58 y.o. 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  [facility] pathology revealed necrotic tissue.  [facility] consultation revealed neoplastic cells are positive for CD20, BCL-6, MUM-1, and BCL-2. The neoplastic cells were negative for CD30, CD3, , CD10, c-MYC, EBV ISH, AE1/3, and PAX-8. Ki67 is 60-70%. 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 The absence of CD30 expression makes primary mediastinal large B-cell lymphoma less likely. FISH for MYC rearrangement was negative. IPI score, age adjusted IPI, are low (1) and NCCN IPI score is low-intermediate (2).   He presented with upper chest fullness with radiation to his neck and arm.  He has had B symptoms (sweats and weight loss) worrisome for lymphoma.  LDH is 273.  Uric acid was normal.  Beta-HCG and AFP were normal on 01/16/2018.  On   Bone marrow biopsy on 02/03/2018 revealed a slightly hypercellular bone marrow for age with trilineage hematopoiesis.  There was no monoclonal B-cell population or abnormal T-cell phenotype identified.  Cytogenetics were normal (46, XY).   He received 6 cycles of RCHOP chemotherapy (02/16/2018 - 06/10/2018) with Neulasta  support.  He received radiation (3600  cGy in 18 fractions) from 07/27/2018 - 08/19/2018.   Patient has had surveillance scans up until November 2021 Which showed small residual MR for soft tissue density in the mediastinum measuring 2.4 cm x 1.2 cm with no significant change over the last 1 year.  Interval history-  Bethel Gaglio is a 58 year old male with diffuse large B-cell lymphoma in remission and secondary polycythemia who presents for oncology follow-up of elevated hemoglobin and hematocrit.  He is six years post-treatment for diffuse large B-cell lymphoma. He reports no B symptoms.  He has persistent secondary polycythemia, with hemoglobin approximately 18 g/dL and hematocrit 45.7%. Prior evaluation for myeloproliferative neoplasms was negative, and there is no evidence of a bone marrow disorder. He does not smoke. Workup for sleep apnea as a potential etiology is ongoing. He is unable to donate blood due to his lymphoma history, as flagged by his insurance provider.  He remains asymptomatic from polycythemia.     ECOG PS- 1 Pain scale- 0   Review of systems- Review of Systems  Constitutional:  Negative for chills, fever, malaise/fatigue and weight loss.  HENT:  Negative for congestion, ear discharge and nosebleeds.   Eyes:  Negative for blurred vision.  Respiratory:  Negative for cough, hemoptysis, sputum production, shortness of breath and wheezing.   Cardiovascular:  Negative for chest pain, palpitations, orthopnea and claudication.  Gastrointestinal:  Negative for abdominal pain, blood in stool, constipation, diarrhea, heartburn, melena, nausea and vomiting.  Genitourinary:  Negative for dysuria, flank pain, frequency, hematuria and urgency.  Musculoskeletal:  Negative for back pain, joint pain and myalgias.  Skin:  Negative for rash.  Neurological:  Negative for dizziness, tingling, focal weakness, seizures, weakness and headaches.  Endo/Heme/Allergies:  Does not bruise/bleed easily.   Psychiatric/Behavioral:  Negative for depression and suicidal ideas. The patient does not have insomnia.       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   Cervical disc disease    Coronary artery disease    Hypertension    Ischemic cardiomyopathy    Large B-cell lymphoma (HCC)    Neuropathy due to chemotherapeutic drug    Non Hodgkin's lymphoma (HCC)    Precordial chest pain    TIA (transient ischemic attack)    Tubular adenoma of colon      Past Surgical History:  Procedure Laterality Date   ANKLE SURGERY Right    COLONOSCOPY N/A 03/14/2024   Procedure: COLONOSCOPY;  Surgeon: Maryruth Ole DASEN, MD;  Location: [facility] ENDOSCOPY;  Service: Endoscopy;  Laterality: N/A;   CORONARY ARTERY BYPASS GRAFT     LEFT HEART CATH AND CORONARY ANGIOGRAPHY N/A 05/05/2022   Procedure: LEFT HEART CATH AND CORONARY ANGIOGRAPHY;  Surgeon: Darron Deatrice LABOR, MD;  Location: [facility] INVASIVE CV LAB;  Service: Cardiovascular;  Laterality: N/A;   POLYPECTOMY  03/14/2024   Procedure: POLYPECTOMY, INTESTINE;  Surgeon: Maryruth Ole DASEN, MD;  Location: [facility] ENDOSCOPY;  Service: Endoscopy;;   PORTACATH PLACEMENT Right 02/11/2018   Procedure: INSERTION PORT-A-CATH;  Surgeon: Jordis Laneta FALCON, MD;  Location: [facility] ORS;  Service: General;  Laterality: Right;   Status post ligation of left atrial appendage N/A     Social History   Socioeconomic History   Marital status: Single    Spouse name: Not on file   Number of children: 1   Years of education: Not on file   Highest education level: Not on file  Occupational History   Not on file  Tobacco Use   Smoking status: Never   Smokeless tobacco: Never  Vaping Use   Vaping status: Never Used  Substance and Sexual Activity   Alcohol use: Yes    Comment: rarely   Drug use: No   Sexual activity: Not Currently  Other Topics Concern   Not on file  Social History Narrative    Right handed   Caffeine oz per day    Son lives in [place] (the country): 47 y.o.   Social Drivers of Health   Tobacco Use: Low Risk (07/05/2024)   Patient History    Smoking Tobacco Use: Never    Smokeless Tobacco Use: Never    Passive Exposure: Not on file  Financial Resource Strain: Low Risk  (06/13/2024)   Received from [facility]   Overall Financial Resource Strain (CARDIA)    Difficulty of Paying Living Expenses: Not hard at all  Food Insecurity: No Food Insecurity (06/13/2024)   Received from [facility]   Epic    Within the past 12 months, you worried that your food would run out before you got the money to buy more.: Never true    Within the past 12 months, the food you bought just didn't last and you didn't have money to get more.: Never true  Transportation Needs: No Transportation Needs (06/13/2024)   Received from [facility] - Transportation    In the past 12 months, has lack of transportation kept you from medical appointments or from getting medications?: No    Lack of  Transportation (Non-Medical): No  Physical Activity: Not on file  Stress: Not on file  Social Connections: Not on file  Intimate Partner Violence: Patient Declined (11/26/2022)   Humiliation, Afraid, Rape, and Kick questionnaire    Fear of Current or Ex-Partner: Patient declined    Emotionally Abused: Patient declined    Physically Abused: Patient declined    Sexually Abused: Patient declined  Depression (PHQ2-9): Low Risk (07/05/2024)   Depression (PHQ2-9)    PHQ-2 Score: 0  Alcohol Screen: Not on file  Housing: Low Risk  (06/13/2024)   Received from [facility]   Epic    At any time in the past 12 months, were you homeless or living in a shelter (including now)?: No    In the last 12 months, was there a time when you were not able to pay the mortgage or rent on time?: No    In the past 12 months, how many times  have you moved where you were living?: 0  Utilities: Not At Risk (06/13/2024)   Received from [facility]   Epic    In the past 12 months has the electric, gas, oil, or water company threatened to shut off services in your home?: No  Health Literacy: Not on file    Family History  Problem Relation Age of Onset   Hypertension Mother    Hypertension Father    Multiple myeloma Father    Transient ischemic attack Father     Current Medications[2]  Physical exam:  Vitals:   07/05/24 1110  BP: 107/78  Pulse: 74  Temp: (!) 96 F (35.6 C)  TempSrc: Tympanic  SpO2: 100%  Weight: 260 lb (117.9 kg)  Height: 6' (1.829 m)   Physical Exam Cardiovascular:     Rate and Rhythm: Normal rate and regular rhythm.     Heart sounds: Normal heart sounds.  Pulmonary:     Effort: Pulmonary effort is normal.     Breath sounds: Normal breath sounds.  Skin:    General: Skin is warm and dry.  Neurological:     Mental Status: He is alert and oriented to person, place, and time.      I have personally reviewed labs listed below:    Latest Ref Rng & Units 07/05/2024   10:53 AM  CMP  Glucose 70 - 99 mg/dL 95   BUN 6 - 20 mg/dL 16   Creatinine 9.38 - 1.24 mg/dL 8.88   Sodium 864 - 854 mmol/L 138   Potassium 3.5 - 5.1 mmol/L 4.4   Chloride 98 - 111 mmol/L 103   CO2 22 - 32 mmol/L 24   Calcium  8.9 - 10.3 mg/dL 9.6   Total Protein 6.5 - 8.1 g/dL 6.9   Total Bilirubin 0.0 - 1.2 mg/dL 1.3   Alkaline Phos 38 - 126 U/L 69   AST 15 - 41 U/L 28   ALT 0 - 44 U/L 28       Latest Ref Rng & Units 07/05/2024   10:53 AM  CBC  WBC 4.0 - 10.5 K/uL 9.9   Hemoglobin 13.0 - 17.0 g/dL 81.8   Hematocrit 60.9 - 52.0 % 54.2   Platelets 150 - 400 K/uL 201      Assessment and plan- Patient is a 58 y.o. male here for routine follow-up of following issues:  Assessment and Plan    Secondary polycythemia Hemoglobin and hematocrit elevated but below phlebotomy threshold.  Myeloproliferative neoplasms and bone marrow  disorders excluded. Likely secondary to possible sleep apnea, pending evaluation. - Performed therapeutic phlebotomy due to inability to donate blood. - Reassess hemoglobin and hematocrit in three months. - Await sleep apnea evaluation results. - Target hematocrit >55 for future phlebotomy. - Follow-up in six months.  Diffuse large B-cell lymphoma in remission Six years post-treatment with no recurrence or B symptoms. Recurrence risk significantly reduced after five years. - Performed whole body exam for lymphadenopathy. - No routine imaging or surveillance scans unless new symptoms arise.          Visit Diagnosis 1. Polycythemia, secondary      Dr. Annah Skene, MD, MPH [facility] [phone] 07/05/2024 3:12 PM                   [1] No Known Allergies [2]  Current Outpatient Medications:    acetaminophen  (TYLENOL ) 325 MG tablet, Take 2 tablets (650 mg total) by mouth every 6 (six) hours as needed for mild pain (or Fever >/= 101)., Disp: , Rfl:    aspirin  EC 81 MG tablet, Take 1 tablet (81 mg total) by mouth daily. Swallow whole., Disp: , Rfl:    empagliflozin  (JARDIANCE ) 10 MG TABS tablet, Take 10 mg by mouth daily., Disp: , Rfl:    metoprolol  succinate (TOPROL -XL) 50 MG 24 hr tablet, Take 50 mg by mouth 2 (two) times daily., Disp: , Rfl:    rosuvastatin  (CRESTOR ) 40 MG tablet, Take 1 tablet (40 mg total) by mouth daily., Disp: 90 tablet, Rfl: 2   sacubitril -valsartan  (ENTRESTO ) 24-26 MG, Take 1 tablet by mouth 2 (two) times daily., Disp: , Rfl:    spironolactone  (ALDACTONE ) 25 MG tablet, TAKE 1/2 TABLET BY MOUTH DAILY, Disp: 45 tablet, Rfl: 0

## 2024-07-05 NOTE — Patient Instructions (Signed)

## 2024-07-06 ENCOUNTER — Telehealth: Payer: Self-pay | Admitting: Oncology

## 2024-07-06 LAB — ERYTHROPOIETIN: Erythropoietin: 18.5 m[IU]/mL (ref 2.6–18.5)

## 2024-07-06 NOTE — Telephone Encounter (Signed)
 Called pt to r/s appts - pt confirmed new appt date/time - LH

## 2024-07-20 LAB — NGS JAK2 EXONS 12-15

## 2024-10-03 ENCOUNTER — Inpatient Hospital Stay

## 2025-01-03 ENCOUNTER — Inpatient Hospital Stay

## 2025-01-03 ENCOUNTER — Inpatient Hospital Stay: Admitting: Oncology
